# Patient Record
Sex: Male | Born: 1950 | Race: White | Hispanic: No | Marital: Married | Smoking: Current every day smoker
Health system: Southern US, Community
[De-identification: ages and names within clinical notes are randomized; demographics above are authoritative.]

## PROBLEM LIST (undated history)

## (undated) DIAGNOSIS — D126 Benign neoplasm of colon, unspecified: Secondary | ICD-10-CM

## (undated) DIAGNOSIS — D682 Hereditary deficiency of other clotting factors: Secondary | ICD-10-CM

## (undated) DIAGNOSIS — M1990 Unspecified osteoarthritis, unspecified site: Secondary | ICD-10-CM

## (undated) DIAGNOSIS — I10 Essential (primary) hypertension: Secondary | ICD-10-CM

## (undated) DIAGNOSIS — D689 Coagulation defect, unspecified: Secondary | ICD-10-CM

## (undated) DIAGNOSIS — E785 Hyperlipidemia, unspecified: Secondary | ICD-10-CM

## (undated) DIAGNOSIS — T7840XA Allergy, unspecified, initial encounter: Secondary | ICD-10-CM

## (undated) DIAGNOSIS — M75101 Unspecified rotator cuff tear or rupture of right shoulder, not specified as traumatic: Secondary | ICD-10-CM

## (undated) DIAGNOSIS — K648 Other hemorrhoids: Secondary | ICD-10-CM

## (undated) DIAGNOSIS — I82409 Acute embolism and thrombosis of unspecified deep veins of unspecified lower extremity: Secondary | ICD-10-CM

## (undated) HISTORY — DX: Acute embolism and thrombosis of unspecified deep veins of unspecified lower extremity: I82.409

## (undated) HISTORY — PX: COLONOSCOPY: SHX174

## (undated) HISTORY — DX: Allergy, unspecified, initial encounter: T78.40XA

## (undated) HISTORY — PX: SALIVARY STONE REMOVAL: SHX5213

## (undated) HISTORY — DX: Unspecified rotator cuff tear or rupture of right shoulder, not specified as traumatic: M75.101

## (undated) HISTORY — DX: Essential (primary) hypertension: I10

## (undated) HISTORY — DX: Coagulation defect, unspecified: D68.9

## (undated) HISTORY — PX: OTHER SURGICAL HISTORY: SHX169

## (undated) HISTORY — DX: Benign neoplasm of colon, unspecified: D12.6

## (undated) HISTORY — DX: Other hemorrhoids: K64.8

## (undated) HISTORY — DX: Unspecified osteoarthritis, unspecified site: M19.90

## (undated) HISTORY — DX: Hereditary deficiency of other clotting factors: D68.2

## (undated) HISTORY — DX: Hyperlipidemia, unspecified: E78.5

---

## 1999-12-06 ENCOUNTER — Emergency Department (HOSPITAL_COMMUNITY): Admission: EM | Admit: 1999-12-06 | Discharge: 1999-12-06 | Payer: Self-pay | Admitting: Emergency Medicine

## 1999-12-06 ENCOUNTER — Encounter: Payer: Self-pay | Admitting: Emergency Medicine

## 1999-12-17 ENCOUNTER — Ambulatory Visit (HOSPITAL_COMMUNITY): Admission: RE | Admit: 1999-12-17 | Discharge: 1999-12-17 | Payer: Self-pay | Admitting: Orthopedic Surgery

## 2001-11-06 ENCOUNTER — Ambulatory Visit (HOSPITAL_COMMUNITY): Admission: RE | Admit: 2001-11-06 | Discharge: 2001-11-06 | Payer: Self-pay | Admitting: Otolaryngology

## 2001-11-06 ENCOUNTER — Encounter: Payer: Self-pay | Admitting: Otolaryngology

## 2002-09-07 ENCOUNTER — Ambulatory Visit (HOSPITAL_COMMUNITY): Admission: RE | Admit: 2002-09-07 | Discharge: 2002-09-07 | Payer: Self-pay | Admitting: Otolaryngology

## 2002-09-07 ENCOUNTER — Encounter: Payer: Self-pay | Admitting: Otolaryngology

## 2004-11-30 ENCOUNTER — Ambulatory Visit: Payer: Self-pay | Admitting: Internal Medicine

## 2004-12-07 ENCOUNTER — Ambulatory Visit: Payer: Self-pay | Admitting: Internal Medicine

## 2005-01-18 ENCOUNTER — Ambulatory Visit: Payer: Self-pay | Admitting: Internal Medicine

## 2005-01-22 ENCOUNTER — Ambulatory Visit: Payer: Self-pay | Admitting: Internal Medicine

## 2005-02-15 ENCOUNTER — Ambulatory Visit: Payer: Self-pay | Admitting: Internal Medicine

## 2005-03-25 ENCOUNTER — Ambulatory Visit: Payer: Self-pay | Admitting: Internal Medicine

## 2005-04-26 ENCOUNTER — Ambulatory Visit: Payer: Self-pay | Admitting: Internal Medicine

## 2005-06-21 ENCOUNTER — Ambulatory Visit: Payer: Self-pay | Admitting: Internal Medicine

## 2005-06-28 ENCOUNTER — Ambulatory Visit: Payer: Self-pay | Admitting: Internal Medicine

## 2005-12-13 ENCOUNTER — Ambulatory Visit: Payer: Self-pay | Admitting: Internal Medicine

## 2006-01-10 ENCOUNTER — Ambulatory Visit: Payer: Self-pay | Admitting: Internal Medicine

## 2006-07-07 ENCOUNTER — Ambulatory Visit: Payer: Self-pay | Admitting: Internal Medicine

## 2006-07-11 ENCOUNTER — Ambulatory Visit: Payer: Self-pay | Admitting: Internal Medicine

## 2006-09-12 ENCOUNTER — Ambulatory Visit: Payer: Self-pay | Admitting: Internal Medicine

## 2006-11-14 DIAGNOSIS — D126 Benign neoplasm of colon, unspecified: Secondary | ICD-10-CM

## 2006-11-14 HISTORY — DX: Benign neoplasm of colon, unspecified: D12.6

## 2007-01-16 ENCOUNTER — Ambulatory Visit: Payer: Self-pay | Admitting: Internal Medicine

## 2007-01-16 LAB — CONVERTED CEMR LAB
ALT: 45 units/L — ABNORMAL HIGH (ref 0–40)
AST: 34 units/L (ref 0–37)
Albumin: 3.5 g/dL (ref 3.5–5.2)
Alkaline Phosphatase: 50 units/L (ref 39–117)
BUN: 17 mg/dL (ref 6–23)
Basophils Absolute: 0 10*3/uL (ref 0.0–0.1)
Basophils Relative: 0.1 % (ref 0.0–1.0)
Bilirubin, Direct: 0.1 mg/dL (ref 0.0–0.3)
CO2: 32 meq/L (ref 19–32)
Calcium: 9.6 mg/dL (ref 8.4–10.5)
Chloride: 107 meq/L (ref 96–112)
Cholesterol: 195 mg/dL (ref 0–200)
Creatinine, Ser: 0.8 mg/dL (ref 0.4–1.5)
Eosinophils Absolute: 0.2 10*3/uL (ref 0.0–0.6)
Eosinophils Relative: 2.7 % (ref 0.0–5.0)
GFR calc Af Amer: 129 mL/min
GFR calc non Af Amer: 107 mL/min
Glucose, Bld: 110 mg/dL — ABNORMAL HIGH (ref 70–99)
HCT: 47.9 % (ref 39.0–52.0)
HDL: 52.4 mg/dL (ref >39.0)
Hemoglobin: 16.1 g/dL (ref 13.0–17.0)
Hgb A1c MFr Bld: 5.5 % (ref 4.6–6.0)
LDL Cholesterol: 111 mg/dL — ABNORMAL HIGH (ref 0–99)
Lymphocytes Relative: 32.1 % (ref 12.0–46.0)
MCHC: 33.7 g/dL (ref 30.0–36.0)
MCV: 96.2 fL (ref 78.0–100.0)
Monocytes Absolute: 0.9 10*3/uL — ABNORMAL HIGH (ref 0.2–0.7)
Monocytes Relative: 13.5 % — ABNORMAL HIGH (ref 3.0–11.0)
Neutro Abs: 3.2 10*3/uL (ref 1.4–7.7)
Neutrophils Relative %: 51.6 % (ref 43.0–77.0)
PSA: 0.39 ng/mL (ref 0.10–4.00)
Platelets: 188 10*3/uL (ref 150–400)
Potassium: 4.7 meq/L (ref 3.5–5.1)
RBC: 4.98 M/uL (ref 4.22–5.81)
RDW: 12.6 % (ref 11.5–14.6)
Sodium: 144 meq/L (ref 135–145)
TSH: 1.02 microintl units/mL (ref 0.35–5.50)
Total Bilirubin: 0.7 mg/dL (ref 0.3–1.2)
Total CHOL/HDL Ratio: 3.7
Total Protein: 6.2 g/dL (ref 6.0–8.3)
Triglycerides: 157 mg/dL — ABNORMAL HIGH (ref 0–149)
VLDL: 31 mg/dL (ref 0–40)
WBC: 6.3 10*3/uL (ref 4.5–10.5)

## 2007-01-30 ENCOUNTER — Ambulatory Visit: Payer: Self-pay | Admitting: Internal Medicine

## 2007-02-23 ENCOUNTER — Ambulatory Visit: Payer: Self-pay | Admitting: Gastroenterology

## 2007-03-06 ENCOUNTER — Ambulatory Visit: Payer: Self-pay | Admitting: Gastroenterology

## 2007-03-06 ENCOUNTER — Encounter: Payer: Self-pay | Admitting: Gastroenterology

## 2007-07-31 ENCOUNTER — Ambulatory Visit: Payer: Self-pay | Admitting: Internal Medicine

## 2007-07-31 DIAGNOSIS — Z86718 Personal history of other venous thrombosis and embolism: Secondary | ICD-10-CM

## 2007-07-31 DIAGNOSIS — E785 Hyperlipidemia, unspecified: Secondary | ICD-10-CM | POA: Insufficient documentation

## 2007-07-31 DIAGNOSIS — D6851 Activated protein C resistance: Secondary | ICD-10-CM | POA: Insufficient documentation

## 2007-07-31 DIAGNOSIS — Z860100 Personal history of colon polyps, unspecified: Secondary | ICD-10-CM

## 2007-07-31 DIAGNOSIS — J309 Allergic rhinitis, unspecified: Secondary | ICD-10-CM | POA: Insufficient documentation

## 2007-07-31 HISTORY — DX: Personal history of colonic polyps: Z86.010

## 2007-07-31 HISTORY — DX: Allergic rhinitis, unspecified: J30.9

## 2007-07-31 HISTORY — DX: Activated protein C resistance: D68.51

## 2007-07-31 HISTORY — DX: Personal history of other venous thrombosis and embolism: Z86.718

## 2007-07-31 HISTORY — DX: Personal history of colon polyps, unspecified: Z86.0100

## 2007-07-31 LAB — CONVERTED CEMR LAB
ALT: 31 units/L (ref 0–53)
AST: 26 units/L (ref 0–37)
Albumin: 3.6 g/dL (ref 3.5–5.2)
Alkaline Phosphatase: 52 units/L (ref 39–117)
Bilirubin, Direct: 0.1 mg/dL (ref 0.0–0.3)
Cholesterol, target level: 200 mg/dL
Cholesterol: 208 mg/dL (ref 0–200)
Direct LDL: 125.1 mg/dL
HDL goal, serum: 40 mg/dL
HDL: 48.2 mg/dL (ref >39.0)
LDL Goal: 160 mg/dL
Total Bilirubin: 0.7 mg/dL (ref 0.3–1.2)
Total CHOL/HDL Ratio: 4.3
Total Protein: 6 g/dL (ref 6.0–8.3)
Triglycerides: 174 mg/dL — ABNORMAL HIGH (ref 0–149)
VLDL: 35 mg/dL (ref 0–40)

## 2007-12-11 DIAGNOSIS — I872 Venous insufficiency (chronic) (peripheral): Secondary | ICD-10-CM

## 2007-12-11 HISTORY — DX: Venous insufficiency (chronic) (peripheral): I87.2

## 2008-01-15 ENCOUNTER — Ambulatory Visit: Payer: Self-pay | Admitting: Internal Medicine

## 2008-01-15 LAB — CONVERTED CEMR LAB
ALT: 25 units/L (ref 0–53)
AST: 20 units/L (ref 0–37)
Albumin: 3.5 g/dL (ref 3.5–5.2)
Alkaline Phosphatase: 43 units/L (ref 39–117)
BUN: 15 mg/dL (ref 6–23)
Basophils Relative: 0.2 % (ref 0.0–1.0)
Bilirubin Urine: NEGATIVE
Bilirubin, Direct: 0.1 mg/dL (ref 0.0–0.3)
Blood in Urine, dipstick: NEGATIVE
CO2: 31 meq/L (ref 19–32)
Calcium: 9.4 mg/dL (ref 8.4–10.5)
Chloride: 108 meq/L (ref 96–112)
Cholesterol: 183 mg/dL (ref 0–200)
Creatinine, Ser: 0.8 mg/dL (ref 0.4–1.5)
Eosinophils Relative: 1.9 % (ref 0.0–5.0)
GFR calc Af Amer: 129 mL/min
GFR calc non Af Amer: 106 mL/min
Glucose, Bld: 94 mg/dL (ref 70–99)
Glucose, Urine, Semiquant: NEGATIVE
HCT: 45.9 % (ref 39.0–52.0)
HDL: 47.5 mg/dL (ref >39.0)
Hemoglobin: 14.9 g/dL (ref 13.0–17.0)
Ketones, urine, test strip: NEGATIVE
LDL Cholesterol: 118 mg/dL — ABNORMAL HIGH (ref 0–99)
Lymphocytes Relative: 20.9 % (ref 12.0–46.0)
MCHC: 32.6 g/dL (ref 30.0–36.0)
MCV: 99.1 fL (ref 78.0–100.0)
Monocytes Relative: 11 % (ref 3.0–12.0)
Neutrophils Relative %: 66 % (ref 43.0–77.0)
Nitrite: NEGATIVE
PSA: 0.31 ng/mL (ref 0.10–4.00)
Platelets: 170 10*3/uL (ref 150–400)
Potassium: 5.2 meq/L — ABNORMAL HIGH (ref 3.5–5.1)
Protein, U semiquant: NEGATIVE
RBC: 4.63 M/uL (ref 4.22–5.81)
RDW: 12.4 % (ref 11.5–14.6)
Sodium: 144 meq/L (ref 135–145)
Specific Gravity, Urine: 1.015
TSH: 0.84 microintl units/mL (ref 0.35–5.50)
Total Bilirubin: 0.8 mg/dL (ref 0.3–1.2)
Total CHOL/HDL Ratio: 3.9
Total Protein: 5.6 g/dL — ABNORMAL LOW (ref 6.0–8.3)
Triglycerides: 90 mg/dL (ref 0–149)
Urobilinogen, UA: 0.2
VLDL: 18 mg/dL (ref 0–40)
WBC Urine, dipstick: NEGATIVE
WBC: 9.2 10*3/uL (ref 4.5–10.5)
pH: 6.5

## 2008-03-04 ENCOUNTER — Ambulatory Visit: Payer: Self-pay | Admitting: Internal Medicine

## 2008-03-05 ENCOUNTER — Encounter: Payer: Self-pay | Admitting: Internal Medicine

## 2008-07-15 ENCOUNTER — Ambulatory Visit: Payer: Self-pay | Admitting: Internal Medicine

## 2008-07-15 DIAGNOSIS — M1990 Unspecified osteoarthritis, unspecified site: Secondary | ICD-10-CM | POA: Insufficient documentation

## 2008-07-15 DIAGNOSIS — M26609 Unspecified temporomandibular joint disorder, unspecified side: Secondary | ICD-10-CM

## 2008-07-15 HISTORY — DX: Unspecified temporomandibular joint disorder, unspecified side: M26.609

## 2008-08-26 ENCOUNTER — Ambulatory Visit: Payer: Self-pay | Admitting: Internal Medicine

## 2008-08-26 LAB — CONVERTED CEMR LAB
ALT: 31 units/L (ref 0–53)
Cholesterol: 200 mg/dL (ref 0–200)
HDL: 47.3 mg/dL (ref >39.0)
Total Protein: 5.9 g/dL — ABNORMAL LOW (ref 6.0–8.3)
Triglycerides: 83 mg/dL (ref 0–149)
VLDL: 17 mg/dL (ref 0–40)

## 2008-09-02 ENCOUNTER — Ambulatory Visit: Payer: Self-pay | Admitting: Internal Medicine

## 2009-03-03 ENCOUNTER — Ambulatory Visit: Payer: Self-pay | Admitting: Internal Medicine

## 2009-03-03 LAB — CONVERTED CEMR LAB
ALT: 25 units/L (ref 0–53)
Albumin: 3.7 g/dL (ref 3.5–5.2)
BUN: 19 mg/dL (ref 6–23)
Basophils Relative: 0.7 % (ref 0.0–3.0)
Bilirubin Urine: NEGATIVE
Chloride: 109 meq/L (ref 96–112)
Cholesterol: 183 mg/dL (ref 0–200)
Eosinophils Relative: 3.5 % (ref 0.0–5.0)
Glucose, Urine, Semiquant: NEGATIVE
HCT: 42.6 % (ref 39.0–52.0)
Hemoglobin: 14.8 g/dL (ref 13.0–17.0)
LDL Cholesterol: 106 mg/dL — ABNORMAL HIGH (ref 0–99)
Lymphs Abs: 1.5 10*3/uL (ref 0.7–4.0)
MCV: 97.4 fL (ref 78.0–100.0)
Monocytes Absolute: 0.6 10*3/uL (ref 0.1–1.0)
Neutro Abs: 3 10*3/uL (ref 1.4–7.7)
PSA: 0.31 ng/mL (ref 0.10–4.00)
Platelets: 145 10*3/uL — ABNORMAL LOW (ref 150.0–400.0)
Potassium: 4.5 meq/L (ref 3.5–5.1)
RBC: 4.37 M/uL (ref 4.22–5.81)
TSH: 1.01 microintl units/mL (ref 0.35–5.50)
Total Protein: 6.2 g/dL (ref 6.0–8.3)
Urobilinogen, UA: 0.2
WBC: 5.3 10*3/uL (ref 4.5–10.5)

## 2009-03-24 ENCOUNTER — Ambulatory Visit: Payer: Self-pay | Admitting: Internal Medicine

## 2009-03-24 DIAGNOSIS — F325 Major depressive disorder, single episode, in full remission: Secondary | ICD-10-CM | POA: Insufficient documentation

## 2009-03-24 DIAGNOSIS — F3342 Major depressive disorder, recurrent, in full remission: Secondary | ICD-10-CM | POA: Insufficient documentation

## 2009-03-24 HISTORY — DX: Major depressive disorder, single episode, in full remission: F32.5

## 2009-04-21 ENCOUNTER — Ambulatory Visit: Payer: Self-pay | Admitting: Internal Medicine

## 2009-06-14 ENCOUNTER — Ambulatory Visit: Payer: Self-pay | Admitting: Family Medicine

## 2009-06-27 ENCOUNTER — Encounter: Payer: Self-pay | Admitting: Internal Medicine

## 2009-07-07 ENCOUNTER — Ambulatory Visit: Payer: Self-pay | Admitting: Internal Medicine

## 2009-09-22 ENCOUNTER — Ambulatory Visit: Payer: Self-pay | Admitting: Internal Medicine

## 2009-11-24 ENCOUNTER — Ambulatory Visit: Payer: Self-pay | Admitting: Internal Medicine

## 2009-11-24 DIAGNOSIS — K648 Other hemorrhoids: Secondary | ICD-10-CM

## 2009-11-24 HISTORY — DX: Other hemorrhoids: K64.8

## 2010-05-11 ENCOUNTER — Ambulatory Visit: Payer: Self-pay | Admitting: Internal Medicine

## 2010-05-11 LAB — CONVERTED CEMR LAB
ALT: 29 units/L (ref 0–53)
AST: 23 units/L (ref 0–37)
Albumin: 3.7 g/dL (ref 3.5–5.2)
Alkaline Phosphatase: 52 units/L (ref 39–117)
Basophils Relative: 0.3 % (ref 0.0–3.0)
Bilirubin, Direct: 0.1 mg/dL (ref 0.0–0.3)
CO2: 31 meq/L (ref 19–32)
Calcium: 9.1 mg/dL (ref 8.4–10.5)
Chloride: 104 meq/L (ref 96–112)
Eosinophils Absolute: 0.2 10*3/uL (ref 0.0–0.7)
Eosinophils Relative: 3.3 % (ref 0.0–5.0)
Hemoglobin: 15.1 g/dL (ref 13.0–17.0)
LDL Cholesterol: 115 mg/dL — ABNORMAL HIGH (ref 0–99)
Lymphocytes Relative: 26 % (ref 12.0–46.0)
MCHC: 33.6 g/dL (ref 30.0–36.0)
MCV: 99.9 fL (ref 78.0–100.0)
Neutro Abs: 4.4 10*3/uL (ref 1.4–7.7)
Neutrophils Relative %: 59.2 % (ref 43.0–77.0)
Nitrite: NEGATIVE
RBC: 4.49 M/uL (ref 4.22–5.81)
Sodium: 138 meq/L (ref 135–145)
Specific Gravity, Urine: 1.025
Total CHOL/HDL Ratio: 3
Total Protein: 6.2 g/dL (ref 6.0–8.3)
Urobilinogen, UA: 0.2
WBC Urine, dipstick: NEGATIVE
WBC: 7.4 10*3/uL (ref 4.5–10.5)

## 2010-05-25 ENCOUNTER — Ambulatory Visit: Payer: Self-pay | Admitting: Internal Medicine

## 2010-06-23 ENCOUNTER — Encounter (INDEPENDENT_AMBULATORY_CARE_PROVIDER_SITE_OTHER): Payer: Self-pay | Admitting: Emergency Medicine

## 2010-06-23 ENCOUNTER — Inpatient Hospital Stay (HOSPITAL_COMMUNITY): Admission: EM | Admit: 2010-06-23 | Discharge: 2010-06-25 | Payer: Self-pay | Admitting: Emergency Medicine

## 2010-06-23 ENCOUNTER — Ambulatory Visit: Payer: Self-pay | Admitting: Vascular Surgery

## 2010-06-29 ENCOUNTER — Ambulatory Visit: Payer: Self-pay | Admitting: Internal Medicine

## 2010-06-29 LAB — CONVERTED CEMR LAB: INR: 1.2

## 2010-07-02 ENCOUNTER — Ambulatory Visit: Payer: Self-pay | Admitting: Internal Medicine

## 2010-07-02 LAB — CONVERTED CEMR LAB

## 2010-07-06 ENCOUNTER — Ambulatory Visit: Payer: Self-pay | Admitting: Internal Medicine

## 2010-07-06 LAB — CONVERTED CEMR LAB: INR: 1.7

## 2010-07-13 ENCOUNTER — Telehealth: Payer: Self-pay | Admitting: *Deleted

## 2010-07-13 ENCOUNTER — Ambulatory Visit: Payer: Self-pay | Admitting: Internal Medicine

## 2010-07-13 LAB — CONVERTED CEMR LAB: INR: 1.8

## 2010-07-27 ENCOUNTER — Ambulatory Visit: Payer: Self-pay | Admitting: Internal Medicine

## 2010-07-27 LAB — CONVERTED CEMR LAB: INR: 1.9

## 2010-08-17 ENCOUNTER — Ambulatory Visit: Payer: Self-pay | Admitting: Internal Medicine

## 2010-08-17 LAB — CONVERTED CEMR LAB: INR: 1.8

## 2010-09-14 ENCOUNTER — Ambulatory Visit: Payer: Self-pay | Admitting: Internal Medicine

## 2010-10-12 ENCOUNTER — Ambulatory Visit: Payer: Self-pay | Admitting: Internal Medicine

## 2010-10-26 ENCOUNTER — Ambulatory Visit
Admission: RE | Admit: 2010-10-26 | Discharge: 2010-10-26 | Payer: Self-pay | Source: Home / Self Care | Attending: Internal Medicine | Admitting: Internal Medicine

## 2010-10-26 LAB — CONVERTED CEMR LAB: INR: 2.7

## 2010-10-30 ENCOUNTER — Telehealth: Payer: Self-pay | Admitting: Internal Medicine

## 2010-10-31 ENCOUNTER — Telehealth (INDEPENDENT_AMBULATORY_CARE_PROVIDER_SITE_OTHER): Payer: Self-pay | Admitting: *Deleted

## 2010-11-13 NOTE — Assessment & Plan Note (Signed)
Summary: pt labs--ccm  Nurse Visit   Allergies: 1)  Keflex Laboratory Results   Blood Tests      INR: 1.7   (Normal Range: 0.88-1.12   Therap INR: 2.0-3.5)    Orders Added: 1)  Est. Patient Level I [10258] 2)  Protime [52778EU] Prescriptions: WARFARIN SODIUM 10 MG TABS (WARFARIN SODIUM) take one tab once daily or as directed by your doctor  #90 x 1   Entered by:   Kern Reap CMA (AAMA)   Authorized by:   Stacie Glaze MD   Signed by:   Kern Reap CMA (AAMA) on 07/06/2010   Method used:   Electronically to        [facility]  [address] (retail)       [address]       [place], [place]  [address]       Ph: [phone]       Fax: [phone]   RxID:   [id number]    ANTICOAGULATION RECORD  NEW REGIMEN & LAB RESULTS Anticoag. Dx: Deep venous thrombosis Current INR Goal Range: 2.0-3.0 Current INR: 1.7 Current Coumadin Dose(mg): 10mg  qd Regimen: same stop lovenox  Provider: Shamiyah Ngu Repeat testing in: 1 week  Anticoagulation Visit Questionnaire Coumadin dose missed/changed:  No Abnormal Bleeding Symptoms:  No  Any diet changes including alcohol intake, vegetables or greens since the last visit:  No Any illnesses or hospitalizations since the last visit:  No Any signs of clotting since the last visit (including chest discomfort, dizziness, shortness of breath, arm tingling, slurred speech, swelling or redness in leg):  No  MEDICATIONS CRESTOR 10 MG  TABS (ROSUVASTATIN CALCIUM) once daily THERAPEUTIC MULTIVITAMIN   TABS (MULTIPLE VITAMIN) once daily MELOXICAM 7.5 MG TABS (MELOXICAM) one by mouth daily CYMBALTA 30 MG CPEP (DULOXETINE HCL) one by mouth daily WELLBUTRIN XL 150 MG XR24H-TAB (BUPROPION HCL) one by mouth daily HYDROCORTISONE ACE-PRAMOXINE 2.5-1 % CREA (HYDROCORTISONE ACE-PRAMOXINE) apply PR two times a day for 7 days WARFARIN SODIUM 10 MG TABS (WARFARIN SODIUM) take one tab once daily or as directed by your doctor

## 2010-11-13 NOTE — Assessment & Plan Note (Signed)
Summary: cpx/njr/pt rsc from bmp/cjr rsc bmp/njr   Vital Signs:  Patient profile:   60 year old male Height:      75 inches Weight:      233 pounds BMI:     29.23 Temp:     98.2 degrees F oral Pulse rate:   72 / minute Resp:     14 per minute BP sitting:   130 / 82  (left arm)  Vitals Entered By: Willy Eddy, LPN (May 25, 2010 11:48 AM) CC: cpx Is Patient Diabetic? No   Primary Care Provider:  Stacie Glaze MD  CC:  cpx.  History of Present Illness: has lost 11 pounds ( 18 by home) The pt was asked about all immunizations, health maint. 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) 5)  Osteoarthritis  (ICD-715.90) 6)  Preventive Health Care  (ICD-V70.0) 7)  Venous Insufficiency, Legs  (ICD-459.81) 8)  Colonic Polyps, Hx of  (ICD-V12.72) 9)  Hyperlipidemia  (ICD-272.4) 10)  Allergic Rhinitis  (ICD-477.9) 11)  Dvt, Hx of  (ICD-V12.51) 12)  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)  Current Problems (verified): 1)  Acute Maxillary Sinusitis  (ICD-461.0) 2)  Internal Hemorrhoids With Other Complication  (ICD-455.2) 3)  Depression, Major, Recurrent, 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 Once Daily 2)  Crestor 10 Mg  Tabs (Rosuvastatin Calcium) .... Once Daily 3)  Therapeutic Multivitamin   Tabs (Multiple Vitamin) .... Once Daily 4)  Meloxicam 7.5 Mg Tabs (Meloxicam) .... One By Mouth Daily 5)  Cymbalta 30 Mg Cpep (Duloxetine Hcl) .... One By Mouth Daily 6)  Wellbutrin Xl 150 Mg Xr24h-Tab (Bupropion Hcl) .... One By Mouth Daily 7)  Smz-Tmp Ds 800-160 Mg Tabs (Sulfamethoxazole-Trimethoprim) .... One By Mouth Two Times A Day For 10 Days 8)  Allerx-D 120-2.5 Mg Xr12h-Tab (Pseudoephedrine-Methscopolamin) .... One By Mouth Two Times A Day For 10 Days 9)  Hydrocortisone Ace-Pramoxine 2.5-1 % Crea (Hydrocortisone Ace-Pramoxine) .... Apply Pr Two Times A Day For 7 Days  Current Medications (verified): 1)  Bayer Aspirin 325 Mg  Tabs (Aspirin) .... Once Daily 2)  Crestor 10 Mg  Tabs (Rosuvastatin Calcium) .... Once Daily 3)  Therapeutic Multivitamin   Tabs (Multiple Vitamin) .... Once Daily 4)  Meloxicam 7.5 Mg Tabs (Meloxicam) .... One By Mouth Daily 5)  Cymbalta 30 Mg Cpep (Duloxetine Hcl) .... One By Mouth Daily 6)  Wellbutrin Xl 150 Mg Xr24h-Tab (Bupropion Hcl) .... One By Mouth Daily 7)  Hydrocortisone Ace-Pramoxine 2.5-1 % Crea (Hydrocortisone Ace-Pramoxine) .... Apply Pr Two Times A Day For 7 Days  Allergies (verified): 1)  Keflex  Past History:  Family History: Last updated: 07/31/2007 blood cloting  ( factor V )  Social History: Last updated: 07/31/2007 Married Current Smoker Alcohol use-yes Drug use-no Regular exercise-yes  Risk Factors: Exercise: yes (07/31/2007)  Risk Factors: Smoking Status: current (05/25/2010) Packs/Day: 1.0 (05/25/2010)  Past medical, surgical, family and social histories (including risk factors) reviewed, and no changes noted (except as noted below).  Past Medical History: Reviewed history from  07/15/2008 and no changes required. DVT, hx of Allergic rhinitis Hyperlipidemia factor  5 def. Colonic polyps, hx of Osteoarthritis  Past Surgical History: Reviewed history from 07/31/2007 and no changes required. colon 2008   Family History: Reviewed history from 07/31/2007 and no changes required. blood cloting  ( factor V )  Social History: Reviewed history from 07/31/2007 and no changes required. Married Current Smoker Alcohol use-yes Drug use-no Regular exercise-yes  Review of Systems  The patient denies anorexia, fever, weight loss, weight gain, vision loss, decreased hearing, hoarseness, chest pain, syncope, dyspnea on exertion, peripheral edema, prolonged cough, headaches, hemoptysis, abdominal pain, melena, hematochezia, severe indigestion/heartburn, hematuria, incontinence, genital sores, muscle weakness, suspicious skin lesions, transient blindness, difficulty walking, depression, unusual weight change, abnormal bleeding, enlarged lymph nodes, angioedema, and breast masses.    Physical Exam  General:  Well-developed,well-nourished,in no acute distress; alert,appropriate and cooperative throughout examination Head:  Normocephalic and atraumatic without obvious abnormalities. No apparent alopecia or balding. Eyes:  pupils equal and pupils reactive to light.   Ears:  R ear normal and L ear normal.   Nose:  mucosal erythema, mucosal edema, and airflow obstruction.   Mouth:  posterior lymphoid hypertrophy.   Neck:  supple no adenopathy Lungs:  Normal respiratory effort, chest expands symmetrically. Lungs are clear to auscultation, no crackles or wheezes. Abdomen:  soft and nontender Rectal:  No external abnormalities noted. Normal sphincter tone. No rectal masses or tenderness.normal sphincter tone.   Prostate:  no gland enlargement and no nodules.   Msk:  joint tenderness and joint swelling.   Extremities:  No clubbing, cyanosis, edema, or deformity noted with normal  full range of motion of all joints.   Neurologic:  No cranial nerve deficits noted. Station and gait are normal. Plantar reflexes are down-going bilaterally. DTRs are symmetrical throughout. Sensory, motor and coordinative functions appear intact.   Impression & Recommendations:  Problem # 1:  PREVENTIVE HEALTH CARE (ICD-V70.0)  Colonoscopy: abnormal (03/14/2007) Td Booster: Td (10/14/2001)   Flu Vax: Fluvax 3+ (07/07/2009)   Chol: 186 (05/11/2010)   HDL: 58.40 (05/11/2010)   LDL: 115 (05/11/2010)   TG: 63.0 (05/11/2010) TSH: 0.65 (05/11/2010)   HgbA1C: 5.5 (01/16/2007)   PSA: 0.47 (05/11/2010) Next Colonoscopy due:: 03/2012 (03/24/2009)  Discussed using sunscreen, use of alcohol, drug use, self testicular exam, routine dental care, routine eye care, routine physical exam, seat belts, multiple vitamins, osteoporosis prevention, adequate calcium intake in diet, and recommendations for immunizations.  Discussed exercise and checking cholesterol.  Discussed gun safety, safe sex, and contraception. Also recommend checking PSA.  Problem # 2:  FACTOR V DEFICIENCY (ICD-286.3) stable  Complete Medication List: 1)  Bayer Aspirin 325 Mg Tabs (Aspirin) .... Once daily 2)  Crestor 10 Mg Tabs (Rosuvastatin calcium) .... Once daily 3)  Therapeutic Multivitamin Tabs (Multiple vitamin) .... Once daily 4)  Meloxicam 7.5 Mg Tabs (Meloxicam) .... One by mouth daily 5)  Cymbalta 30 Mg Cpep (Duloxetine hcl) .... One by mouth daily 6)  Wellbutrin Xl 150 Mg Xr24h-tab (Bupropion hcl) .Marland KitchenMarland KitchenMarland Kitchen  One by mouth daily 7)  Hydrocortisone Ace-pramoxine 2.5-1 % Crea (Hydrocortisone ace-pramoxine) .... Apply pr two times a day for 7 days  Patient Instructions: 1)  Please schedule a follow-up appointment in 6 months. 2)  Hepatic Panel prior to visit, ICD-9:995.20 3)  Lipid Panel prior to visit, ICD-9:272.4 Prescriptions: WELLBUTRIN XL 150 MG XR24H-TAB (BUPROPION HCL) one by mouth daily  #30 x 6   Entered by:   Willy Eddy, LPN   Authorized by:   Stacie Glaze MD   Signed by:   Willy Eddy, LPN on 16/07/9603   Method used:   Electronically to        [facility]  [facility] [address] (retail)       [address]       [place], [place]  [address]       Ph: [phone]       Fax: [phone]   RxID:   [id number] CYMBALTA 30 MG CPEP (DULOXETINE HCL) one by mouth daily  #30 x 6   Entered by:   Willy Eddy, LPN   Authorized by:   Stacie Glaze MD   Signed by:   Willy Eddy, LPN on 08/10/2535   Method used:   Electronically to        [facility]  [address] [address] (retail)       [address]       [place], [place]  [address]       Ph: [phone]       Fax: [phone]   RxID:   [id number]

## 2010-11-13 NOTE — Assessment & Plan Note (Signed)
Summary: 2 mo rov/mm   Vital Signs:  Patient profile:   60 year old male Height:      75 inches Weight:      244 pounds BMI:     30.61 Temp:     98.2 degrees F oral Pulse rate:   76 / minute Resp:     14 per minute BP sitting:   130 / 80  (left arm)  Vitals Entered By: Willy Eddy, LPN (November 24, 2009 10:26 AM) CC: roa- c/o Rodney Scott , URI symptoms   Primary Care Provider:  Stacie Glaze MD  CC:  roa- c/o uri  and URI symptoms.  History of Present Illness: pain over the right maxilary sinus  URI Symptoms      This is a 60 year old man who presents with URI symptoms.  symptoms persiting over a week .  The patient reports nasal congestion and productive cough, but denies clear nasal discharge, purulent nasal discharge, sore throat, earache, and sick contacts.  The patient denies fever, low-grade fever (<100.5 degrees), fever of 100.5-103 degrees, fever of 103.1-104 degrees, fever to >104 degrees, stiff neck, dyspnea, wheezing, rash, vomiting, diarrhea, use of an antipyretic, and response to antipyretic.  The patient also reports headache and muscle aches.  Risk factors for Strep sinusitis include unilateral facial pain and unilateral nasal discharge.    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) 6)  Osteoarthritis  (ICD-715.90) 7)  Preventive Health Care  (ICD-V70.0) 8)  Venous Insufficiency, Legs  (ICD-459.81) 9)  Hemorrhoids, Internal  (ICD-455.0) 10)  Colonic Polyps, Hx of  (ICD-V12.72) 11)  Hyperlipidemia  (ICD-272.4) 12)  Allergic Rhinitis  (ICD-477.9) 13)  Dvt,  Hx of  (ICD-V12.51) 14)  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)  Current Problems (verified): 1)  Gonococcal Prostatitis  (ICD-098.12) 2)  Depression, Major, Recurrent, 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 Once Daily 2)  Crestor 10 Mg  Tabs (Rosuvastatin Calcium) .... Once Daily 3)  Therapeutic Multivitamin   Tabs (Multiple Vitamin) .... Once Daily 4)  Meloxicam 7.5 Mg Tabs (Meloxicam) .... One By Mouth Daily 5)  Cymbalta 30 Mg Cpep (Duloxetine Hcl) .... One By Mouth Daily 6)  Wellbutrin Xl 150 Mg Xr24h-Tab (Bupropion Hcl) .... One By Mouth Daily  Current Medications (verified): 1)  Bayer Aspirin 325 Mg  Tabs (Aspirin) .... Once Daily 2)  Crestor 10 Mg  Tabs (Rosuvastatin Calcium) .... Once Daily 3)  Therapeutic Multivitamin   Tabs (Multiple Vitamin) .... Once Daily 4)  Meloxicam 7.5 Mg Tabs (Meloxicam) .... One By Mouth Daily  5)  Cymbalta 30 Mg Cpep (Duloxetine Hcl) .... One By Mouth Daily 6)  Wellbutrin Xl 150 Mg Xr24h-Tab (Bupropion Hcl) .... One By Mouth Daily 7)  Smz-Tmp Ds 800-160 Mg Tabs (Sulfamethoxazole-Trimethoprim) .... One By Mouth Two Times A Day For 10 Days 8)  Allerx-D 120-2.5 Mg Xr12h-Tab (Pseudoephedrine-Methscopolamin) .... One By Mouth Two Times A Day For 10 Days 9)  Hydrocortisone Ace-Pramoxine 2.5-1 % Crea (Hydrocortisone Ace-Pramoxine) .... Apply Pr Two Times A Day For 7 Days  Allergies (verified): 1)  Keflex  Past History:  Family  History: Last updated: 07/31/2007 blood cloting  ( factor V )  Social History: Last updated: 07/31/2007 Married Current Smoker Alcohol use-yes Drug use-no Regular exercise-yes  Risk Factors: Exercise: yes (07/31/2007)  Risk Factors: Smoking Status: current (11/24/2009) Packs/Day: 1.0 (11/24/2009)  Past medical, surgical, family and social histories (including risk factors) reviewed, and no changes noted (except as noted below).  Past Medical History: Reviewed history from 07/15/2008 and no changes required. DVT, hx of Allergic rhinitis Hyperlipidemia factor  5 def. Colonic polyps, hx of Osteoarthritis  Past Surgical History: Reviewed history from 07/31/2007 and no changes required. colon 2008   Family History: Reviewed history from 07/31/2007 and no changes required. blood cloting  ( factor V )  Social History: Reviewed history from 07/31/2007 and no changes required. Married Current Smoker Alcohol use-yes Drug use-no Regular exercise-yes  Review of Systems  The patient denies anorexia, fever, weight loss, weight gain, vision loss, decreased hearing, hoarseness, chest pain, syncope, dyspnea on exertion, peripheral edema, prolonged cough, headaches, hemoptysis, abdominal pain, melena, hematochezia, severe indigestion/heartburn, hematuria, incontinence, genital sores, muscle weakness, suspicious skin lesions, transient blindness, difficulty walking, depression, unusual weight change, abnormal bleeding, enlarged lymph nodes, angioedema, and breast masses.    Physical Exam  General:  Well-developed,well-nourished,in no acute distress; alert,appropriate and cooperative throughout examination Head:  Normocephalic and atraumatic without obvious abnormalities. No apparent alopecia or balding. Eyes:  No corneal or conjunctival inflammation noted. EOMI. Perrla. Funduscopic exam benign, without hemorrhages, exudates or papilledema. Vision grossly normal. Neck:  supple no  adenopathy Lungs:  Normal respiratory effort, chest expands symmetrically. Lungs are clear to auscultation, no crackles or wheezes. Heart:  regular rhythm and rate Abdomen:  soft and nontender Msk:  joint tenderness and joint swelling.     Impression & Recommendations:  Problem # 1:  ACUTE MAXILLARY SINUSITIS (ICD-461.0)  Instructed on treatment. Call if symptoms persist or worsen.   His updated medication list for this problem includes:    Smz-tmp Ds 800-160 Mg Tabs (Sulfamethoxazole-trimethoprim) ..... One by mouth two times a day for 10 days    Allerx-d 120-2.5 Mg Xr12h-tab (Pseudoephedrine-methscopolamin) ..... One by mouth two times a day for 10 days  Problem # 2:  INTERNAL HEMORRHOIDS WITH OTHER COMPLICATION (ICD-455.2) dicussion of fiber and bowel hapids had colon in past with this documented analpram two times a day for 7 days to shrink and treat  Complete Medication List: 1)  Bayer Aspirin 325 Mg Tabs (Aspirin) .... Once daily 2)  Crestor 10 Mg Tabs (Rosuvastatin calcium) .... Once daily 3)  Therapeutic Multivitamin Tabs (Multiple vitamin) .... Once daily 4)  Meloxicam 7.5 Mg Tabs (Meloxicam) .... One by mouth daily 5)  Cymbalta 30 Mg Cpep (Duloxetine hcl) .... One by mouth daily 6)  Wellbutrin Xl 150 Mg Xr24h-tab (Bupropion hcl) .... One by mouth daily 7)  Smz-tmp Ds 800-160 Mg Tabs (Sulfamethoxazole-trimethoprim) .... One by mouth two times a day for 10 days 8)  Allerx-d  120-2.5 Mg Xr12h-tab (Pseudoephedrine-methscopolamin) .... One by mouth two times a day for 10 days 9)  Hydrocortisone Ace-pramoxine 2.5-1 % Crea (Hydrocortisone ace-pramoxine) .... Apply pr two times a day for 7 days  Patient Instructions: 1)  Take your antibiotic as prescribed until ALL of it is gone, but stop if you develop a rash or swelling and contact our office as soon as possible. Prescriptions: HYDROCORTISONE ACE-PRAMOXINE 2.5-1 % CREA (HYDROCORTISONE ACE-PRAMOXINE) apply PR two times a day for  7 days  #1 tube x 11   Entered and Authorized by:   Stacie Glaze MD   Signed by:   Stacie Glaze MD on 11/24/2009   Method used:   Electronically to        [facility]  [address] (retail)       [address]       [place], [place]  [address]       Ph: [phone]       Fax: [phone]   RxID:   [id number] ALLERX-D 120-2.5 MG XR12H-TAB (PSEUDOEPHEDRINE-METHSCOPOLAMIN) one by mouth two times a day for 10 days  #20 x 0   Entered and Authorized by:   Stacie Glaze MD   Signed by:   Stacie Glaze MD on 11/24/2009   Method used:   Electronically to        [facility]  [address] (retail)       [address]       [place], [place]  [address]       Ph: [phone]       Fax: [phone]   RxID:   [id number] SMZ-TMP DS 800-160 MG TABS (SULFAMETHOXAZOLE-TRIMETHOPRIM) one by mouth two times a day for 10 days  #20 x 0   Entered and Authorized by:   Stacie Glaze MD   Signed by:   Stacie Glaze MD on 11/24/2009   Method used:   Electronically to        [facility]  [facility] [address] (retail)       [address]       [place], [place]  [address]       Ph: [phone]       Fax: [phone]   RxID:   [id number]

## 2010-11-13 NOTE — Assessment & Plan Note (Signed)
Summary: PT//SLM/PT RSC/CJR  Nurse Visit   Allergies: 1)  Keflex Laboratory Results   Blood Tests   Date/Time Received: July 13, 2010 2:15 PM  Date/Time Reported: July 13, 2010 2:15 PM    INR: 1.8   (Normal Range: 0.88-1.12   Therap INR: 2.0-3.5) Comments: Wynona Canes, CMA  July 13, 2010 2:15 PM     Orders Added: 1)  Est. Patient Level I [99211] 2)  Protime [16109UE]  Laboratory Results   Blood Tests      INR: 1.8   (Normal Range: 0.88-1.12   Therap INR: 2.0-3.5) Comments: Wynona Canes, CMA  July 13, 2010 2:15 PM       ANTICOAGULATION RECORD PREVIOUS REGIMEN & LAB RESULTS Anticoagulation Diagnosis:  Deep venous thrombosis on  07/06/2010 Previous INR Goal Range:  2.0-3.0 on  07/06/2010 Previous INR:  1.7 on  07/06/2010 Previous Coumadin Dose(mg):  10mg  qd on  07/06/2010 Previous Regimen:  same stop lovenox on  07/06/2010  NEW REGIMEN & LAB RESULTS Current INR: 1.8 Regimen: same stop lovenox  (no change)       Repeat testing in: 2 weeks MEDICATIONS CRESTOR 10 MG  TABS (ROSUVASTATIN CALCIUM) once daily THERAPEUTIC MULTIVITAMIN   TABS (MULTIPLE VITAMIN) once daily MELOXICAM 7.5 MG TABS (MELOXICAM) one by mouth daily CYMBALTA 30 MG CPEP (DULOXETINE HCL) one by mouth daily WELLBUTRIN XL 150 MG XR24H-TAB (BUPROPION HCL) one by mouth daily HYDROCORTISONE ACE-PRAMOXINE 2.5-1 % CREA (HYDROCORTISONE ACE-PRAMOXINE) apply PR two times a day for 7 days WARFARIN SODIUM 10 MG TABS (WARFARIN SODIUM) take one tab once daily or as directed by your doctor   Anticoagulation Visit Questionnaire      Coumadin dose missed/changed:  No      Abnormal Bleeding Symptoms:  No   Any diet changes including alcohol intake, vegetables or greens since the last visit:  No Any illnesses or hospitalizations since the last visit:  No Any signs of clotting since the last visit (including chest discomfort, dizziness, shortness of breath, arm tingling, slurred  speech, swelling or redness in leg):  No

## 2010-11-13 NOTE — Progress Notes (Signed)
Summary: Pt req script for Compression Hose  Phone Note Call from Patient Call back at Work Phone [phone]   Caller: Patient Summary of Call: Pt called and is req to get a script for Compression Hose. Pls call.  Initial call taken by: Lucy Antigua,  July 13, 2010 10:47 AM  Follow-up for Phone Call        done and pt will pick up pt today Follow-up by: Willy Eddy, LPN,  July 13, 2010 11:06 AM

## 2010-11-13 NOTE — Assessment & Plan Note (Signed)
Summary: fup pt inr per dr krishman//ccm   Vital Signs:  Patient profile:   60 year old male Height:      75 inches Weight:      230 pounds BMI:     28.85 Temp:     98.2 degrees F oral Pulse rate:   768 / minute Resp:     14 per minute BP sitting:   132 / 88  (left arm) Cuff size:   regular  Vitals Entered By: Willy Eddy, LPN (June 29, 2010 4:21 PM) CC: post hospital-on lovanox and coumadin 7.5- has enough lovanox to last until sunday Is Patient Diabetic? No   Primary Care Provider:  Stacie Glaze MD  CC:  post hospital-on lovanox and coumadin 7.5- has enough lovanox to last until sunday.  History of Present Illness: presented to the hospital with PE source DVT risk factor v deficiency hospital records reviewed with pt I have spent greater that 30 min face to face evaluating this patient of which over 1/2 was in counsilling about factor v dz and the need for coumadin for the forseeable future  Preventive Screening-Counseling & Management  Alcohol-Tobacco     Smoking Status: current     Smoke Cessation Stage: contemplative     Packs/Day: 1.0     Year Started: 1970     Tobacco Counseling: to quit use of tobacco products  Problems Prior to Update: 1)  Pe  (ICD-415.19) 2)  Acute Maxillary Sinusitis  (ICD-461.0) 3)  Internal Hemorrhoids With Other Complication  (ICD-455.2) 4)  Depression, Major, Recurrent, 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) 5)  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) 6)  Osteoarthritis  (ICD-715.90) 7)  Preventive Health Care  (ICD-V70.0) 8)  Venous Insufficiency, Legs  (ICD-459.81) 9)  Colonic Polyps, Hx of  (ICD-V12.72) 10)  Hyperlipidemia  (ICD-272.4) 11)  Allergic Rhinitis  (ICD-477.9) 12)  Dvt, Hx of  (ICD-V12.51) 13)  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)  Current Problems (verified): 1)  Acute Maxillary Sinusitis  (ICD-461.0) 2)  Internal Hemorrhoids With Other Complication  (ICD-455.2) 3)  Depression, Major, Recurrent, Moderate   (ICD-296.32) 4)  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 Once Daily 2)  Crestor 10 Mg  Tabs (Rosuvastatin Calcium) .... Once Daily 3)  Therapeutic Multivitamin   Tabs (Multiple Vitamin) .... Once Daily 4)  Meloxicam 7.5 Mg Tabs (Meloxicam) .... One By Mouth Daily 5)  Cymbalta 30 Mg Cpep (Duloxetine Hcl) .... One By Mouth Daily 6)  Wellbutrin Xl 150 Mg Xr24h-Tab (Bupropion Hcl) .... One By Mouth Daily 7)  Hydrocortisone Ace-Pramoxine 2.5-1 % Crea (Hydrocortisone Ace-Pramoxine) .... Apply Pr Two Times A Day For 7 Days  Current Medications (verified): 1)  Crestor 10 Mg  Tabs (Rosuvastatin Calcium) .... Once Daily 2)  Therapeutic Multivitamin   Tabs (Multiple Vitamin) .... Once Daily 3)  Meloxicam 7.5 Mg Tabs (Meloxicam) .... One By Mouth Daily 4)  Cymbalta 30 Mg Cpep (Duloxetine Hcl) .... One By Mouth Daily 5)  Wellbutrin Xl 150 Mg Xr24h-Tab (Bupropion Hcl) .... One By Mouth Daily 6)  Hydrocortisone Ace-Pramoxine 2.5-1 % Crea (Hydrocortisone Ace-Pramoxine) .... Apply Pr Two  Times A Day For 7 Days 7)  Lovenox 100 Mg/ml Soln (Enoxaparin Sodium) .... Subq Two Times A Day -Discharged 9-12 With #14 8)  Warfarin Sodium 7.5 Mg Tabs (Warfarin Sodium) .Marland Kitchen.. 1 Once Daily  Allergies (verified): 1)  Keflex  Past History:  Family History: Last updated: 07/31/2007 blood cloting  ( factor V )  Social History: Last updated: 07/31/2007 Married Current Smoker Alcohol use-yes Drug use-no Regular exercise-yes  Risk Factors: Exercise: yes (07/31/2007)  Risk Factors: Smoking Status: current (06/29/2010) Packs/Day: 1.0  (06/29/2010)  Past medical, surgical, family and social histories (including risk factors) reviewed, and no changes noted (except as noted below).  Past Medical History: Reviewed history from 07/15/2008 and no changes required. DVT, hx of Allergic rhinitis Hyperlipidemia factor  5 def. Colonic polyps, hx of Osteoarthritis  Past Surgical History: Reviewed history from 07/31/2007 and no changes required. colon 2008   Family History: Reviewed history from 07/31/2007 and no changes required. blood cloting  ( factor V )  Social History: Reviewed history from 07/31/2007 and no changes required. Married Current Smoker Alcohol use-yes Drug use-no Regular exercise-yes  Review of Systems  The patient denies anorexia, fever, weight loss, weight gain, vision loss, decreased hearing, hoarseness, chest pain, syncope, dyspnea on exertion, peripheral edema, prolonged cough, headaches, hemoptysis, abdominal pain, melena, hematochezia, severe indigestion/heartburn, hematuria, incontinence, genital sores, muscle weakness, suspicious skin lesions, transient blindness, difficulty walking, depression, unusual weight change, abnormal bleeding, enlarged lymph nodes, angioedema, breast masses, and testicular masses.         Flu Vaccine Consent Questions     Do you have a history of severe allergic reactions to this vaccine? no    Any prior history of allergic reactions to egg and/or gelatin? no    Do you have a sensitivity to the preservative Thimersol? no    Do you have a past history of Guillan-Barre Syndrome? no    Do you currently have an acute febrile illness? no    Have you ever had a severe reaction to latex? no    Vaccine information given and explained to patient? yes    Are you currently pregnant? no    Lot Number:[id number]   Exp Date:04/13/2011   Site Given  Left Deltoid IM   Physical Exam  General:  Well-developed,well-nourished,in no acute distress; alert,appropriate and  cooperative throughout examination Head:  Normocephalic and atraumatic without obvious abnormalities. No apparent alopecia or balding. Eyes:  pupils equal and pupils reactive to light.   Ears:  R ear normal and L ear normal.   Nose:  mucosal erythema, mucosal edema, and airflow obstruction.   Neck:  supple no adenopathy Lungs:  Normal respiratory effort, chest expands symmetrically. Lungs are clear to auscultation, no crackles or wheezes. Heart:  regular rhythm and rate Abdomen:  soft and nontender Extremities:  trace left pedal edema and trace right pedal edema.   Neurologic:  alert & oriented X3 and gait normal.     Impression & Recommendations:  Problem # 1:  PE (ICD-415.19) Assessment Deteriorated 10 mg friday sat and sunda protime monday The following medications were removed from the medication list:    Bayer Aspirin 325 Mg Tabs (Aspirin) ..... Once daily His updated medication list for this problem includes:    Warfarin Sodium 10 Mg Tabs (Warfarin sodium) .Marland Kitchen... 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 The following medications were removed from the medication list:    Bayer Aspirin 325 Mg Tabs (Aspirin) ..... Once daily His updated medication list for this problem includes:    Warfarin Sodium 10 Mg Tabs (Warfarin sodium) .Marland Kitchen... Take one tab once daily or as directed by your doctor  Orders: Protime (16109UE) Fingerstick (45409)  Complete Medication List: 1)  Crestor 10 Mg Tabs (Rosuvastatin calcium) .... Once daily 2)  Therapeutic Multivitamin Tabs (Multiple vitamin) .... Once daily 3)  Meloxicam 7.5 Mg Tabs (Meloxicam)  .... One by mouth daily 4)  Cymbalta 30 Mg Cpep (Duloxetine hcl) .... One by mouth daily 5)  Wellbutrin Xl 150 Mg Xr24h-tab (Bupropion hcl) .... One by mouth daily 6)  Hydrocortisone Ace-pramoxine 2.5-1 % Crea (Hydrocortisone ace-pramoxine) .... Apply pr two times a day for 7 days 7)  Warfarin Sodium 10 Mg Tabs (Warfarin sodium) .... Take one tab once daily or as directed by your doctor  Other Orders: Admin 1st Vaccine (81191) Flu Vaccine 53yrs + (47829)  Patient Instructions: 1)  take 10 mg one day friday,sat and sunday 2)  protime at 2 pm monday   ANTICOAGULATION RECORD  NEW REGIMEN & LAB RESULTS Anticoag. Dx: Deep venous thrombosis Current INR Goal Range: 2.0-3.0 Current INR: 1.2 Current Coumadin Dose(mg): 7.5mg . QD Regimen:   (no change)   Anticoagulation Visit Questionnaire Coumadin dose missed/changed:  No Abnormal Bleeding Symptoms:  No  Any diet changes including alcohol intake, vegetables or greens since the last visit:  No Any illnesses or hospitalizations since the last visit:  No Any signs of clotting since the last visit (including chest discomfort, dizziness, shortness of breath, arm tingling, slurred speech, swelling or redness in leg):  No  MEDICATIONS CRESTOR 10 MG  TABS (ROSUVASTATIN CALCIUM) once daily THERAPEUTIC MULTIVITAMIN   TABS (MULTIPLE VITAMIN) once daily MELOXICAM 7.5 MG TABS (MELOXICAM) one by mouth daily CYMBALTA 30 MG CPEP (DULOXETINE HCL) one by mouth daily WELLBUTRIN XL 150 MG XR24H-TAB (BUPROPION HCL) one by mouth daily HYDROCORTISONE ACE-PRAMOXINE 2.5-1 % CREA (HYDROCORTISONE ACE-PRAMOXINE) apply PR two times a day for 7 days WARFARIN SODIUM 10 MG TABS (WARFARIN SODIUM) take one tab once daily or as directed by your doctor    Laboratory Results   Blood Tests      INR: 1.2   (Normal Range: 0.88-1.12   Therap INR: 2.0-3.5) Comments: Rita Ohara  June 29, 2010 4:28 PM      Orders Added: 1)  Protime [85610QW] 2)   Fingerstick [36416] 3)  Admin 1st Vaccine [90471] 4)  Flu Vaccine 60yrs + [56213] 5)  Est. Patient Level IV [08657]    Immunization History:  Pneumovax Immunization History:    Pneumovax:  historical (06/22/2010)

## 2010-11-13 NOTE — Assessment & Plan Note (Signed)
Summary: pt/njr  Nurse Visit   Allergies: 1)  Keflex Laboratory Results   Blood Tests   Date/Time Received: July 27, 2010 12:18 PM  Date/Time Reported: July 27, 2010 12:18 PM    INR: 1.9   (Normal Range: 0.88-1.12   Therap INR: 2.0-3.5) Comments: Wynona Canes, CMA  July 27, 2010 12:18 PM     Orders Added: 1)  Est. Patient Level I [99211] 2)  Protime [32951OA]  Laboratory Results   Blood Tests      INR: 1.9   (Normal Range: 0.88-1.12   Therap INR: 2.0-3.5) Comments: Wynona Canes, CMA  July 27, 2010 12:18 PM       ANTICOAGULATION RECORD PREVIOUS REGIMEN & LAB RESULTS Anticoagulation Diagnosis:  Deep venous thrombosis on  07/06/2010 Previous INR Goal Range:  2.0-3.0 on  07/06/2010 Previous INR:  1.8 on  07/13/2010 Previous Coumadin Dose(mg):  10mg  qd on  07/06/2010 Previous Regimen:  same stop lovenox on  07/06/2010  NEW REGIMEN & LAB RESULTS Current INR: 1.9 Regimen: Same Dose       Repeat testing in: 3 weeks MEDICATIONS CRESTOR 10 MG  TABS (ROSUVASTATIN CALCIUM) once daily THERAPEUTIC MULTIVITAMIN   TABS (MULTIPLE VITAMIN) once daily MELOXICAM 7.5 MG TABS (MELOXICAM) one by mouth daily CYMBALTA 30 MG CPEP (DULOXETINE HCL) one by mouth daily WELLBUTRIN XL 150 MG XR24H-TAB (BUPROPION HCL) one by mouth daily HYDROCORTISONE ACE-PRAMOXINE 2.5-1 % CREA (HYDROCORTISONE ACE-PRAMOXINE) apply PR two times a day for 7 days WARFARIN SODIUM 10 MG TABS (WARFARIN SODIUM) take one tab once daily or as directed by your doctor   Anticoagulation Visit Questionnaire      Coumadin dose missed/changed:  No      Abnormal Bleeding Symptoms:  No   Any diet changes including alcohol intake, vegetables or greens since the last visit:  No Any illnesses or hospitalizations since the last visit:  No Any signs of clotting since the last visit (including chest discomfort, dizziness, shortness of breath, arm tingling, slurred speech, swelling or redness in leg):   No

## 2010-11-13 NOTE — Assessment & Plan Note (Signed)
Summary: PT//SLM  Nurse Visit   Allergies: 1)  Keflex Laboratory Results   Blood Tests      INR: 1.5   (Normal Range: 0.88-1.12   Therap INR: 2.0-3.5) Comments: Rita Ohara  July 02, 2010 1:56 PM     Orders Added: 1)  Est. Patient Level I [99211] 2)  Protime [[id number]]   ANTICOAGULATION RECORD  NEW REGIMEN & LAB RESULTS Anticoag. Dx: PE Current INR Goal Range: 2.0-3.0 Current INR: 1.5 Current Coumadin Dose(mg): 10mg . QD Regimen: same  Repeat testing in: Check Fri.  Anticoagulation Visit Questionnaire Coumadin dose missed/changed:  No Abnormal Bleeding Symptoms:  No  Any diet changes including alcohol intake, vegetables or greens since the last visit:  No Any illnesses or hospitalizations since the last visit:  No Any signs of clotting since the last visit (including chest discomfort, dizziness, shortness of breath, arm tingling, slurred speech, swelling or redness in leg):  Yes  MEDICATIONS CRESTOR 10 MG  TABS (ROSUVASTATIN CALCIUM) once daily THERAPEUTIC MULTIVITAMIN   TABS (MULTIPLE VITAMIN) once daily MELOXICAM 7.5 MG TABS (MELOXICAM) one by mouth daily CYMBALTA 30 MG CPEP (DULOXETINE HCL) one by mouth daily WELLBUTRIN XL 150 MG XR24H-TAB (BUPROPION HCL) one by mouth daily HYDROCORTISONE ACE-PRAMOXINE 2.5-1 % CREA (HYDROCORTISONE ACE-PRAMOXINE) apply PR two times a day for 7 days LOVENOX 100 MG/ML SOLN (ENOXAPARIN SODIUM) subq two times a day -discharged 9-12 with #14 WARFARIN SODIUM 7.5 MG TABS (WARFARIN SODIUM) 1 once daily

## 2010-11-13 NOTE — Assessment & Plan Note (Signed)
Summary: PT/CJR  Nurse Visit   Allergies: 1)  Keflex Laboratory Results   Blood Tests      INR: 1.8   (Normal Range: 0.88-1.12   Therap INR: 2.0-3.5) Comments: Rita Ohara  August 17, 2010 10:31 AM     Orders Added: 1)  Est. Patient Level I [99211] 2)  Protime [[id number]]   ANTICOAGULATION RECORD PREVIOUS REGIMEN & LAB RESULTS Anticoagulation Diagnosis:  Deep venous thrombosis on  07/06/2010 Previous INR Goal Range:  2.0-3.0 on  07/06/2010 Previous INR:  1.9 on  07/27/2010 Previous Coumadin Dose(mg):  10mg  qd on  07/06/2010 Previous Regimen:  Same Dose on  07/27/2010  NEW REGIMEN & LAB RESULTS Current INR: 1.8 Regimen: same  Repeat testing in: 4 weeks  Anticoagulation Visit Questionnaire Coumadin dose missed/changed:  No Abnormal Bleeding Symptoms:  Yes  Any diet changes including alcohol intake, vegetables or greens since the last visit:  No Any illnesses or hospitalizations since the last visit:  No Any signs of clotting since the last visit (including chest discomfort, dizziness, shortness of breath, arm tingling, slurred speech, swelling or redness in leg):  No  MEDICATIONS CRESTOR 10 MG  TABS (ROSUVASTATIN CALCIUM) once daily THERAPEUTIC MULTIVITAMIN   TABS (MULTIPLE VITAMIN) once daily MELOXICAM 7.5 MG TABS (MELOXICAM) one by mouth daily CYMBALTA 30 MG CPEP (DULOXETINE HCL) one by mouth daily WELLBUTRIN XL 150 MG XR24H-TAB (BUPROPION HCL) one by mouth daily HYDROCORTISONE ACE-PRAMOXINE 2.5-1 % CREA (HYDROCORTISONE ACE-PRAMOXINE) apply PR two times a day for 7 days WARFARIN SODIUM 10 MG TABS (WARFARIN SODIUM) take one tab once daily or as directed by your doctor

## 2010-11-15 NOTE — Assessment & Plan Note (Signed)
Summary: PT//CCM  Nurse Visit   Allergies: 1)  Keflex Laboratory Results   Blood Tests      INR: 1.8   (Normal Range: 0.88-1.12   Therap INR: 2.0-3.5) Comments: Rita Ohara  September 14, 2010 10:44 AM     Orders Added: 1)  Est. Patient Level I [99211] 2)  Protime [[id number]]   ANTICOAGULATION RECORD PREVIOUS REGIMEN & LAB RESULTS Anticoagulation Diagnosis:  Deep venous thrombosis on  07/06/2010 Previous INR Goal Range:  2.0-3.0 on  07/06/2010 Previous INR:  1.8 on  08/17/2010 Previous Coumadin Dose(mg):  10mg  qd on  07/06/2010 Previous Regimen:  same on  08/17/2010  NEW REGIMEN & LAB RESULTS Current INR: 1.8 Regimen: 10, 10, 10, 15mg . ALT.  Repeat testing in: 4 weeks  Anticoagulation Visit Questionnaire Coumadin dose missed/changed:  No Abnormal Bleeding Symptoms:  No  Any diet changes including alcohol intake, vegetables or greens since the last visit:  No Any illnesses or hospitalizations since the last visit:  No Any signs of clotting since the last visit (including chest discomfort, dizziness, shortness of breath, arm tingling, slurred speech, swelling or redness in leg):  No  MEDICATIONS CRESTOR 10 MG  TABS (ROSUVASTATIN CALCIUM) once daily THERAPEUTIC MULTIVITAMIN   TABS (MULTIPLE VITAMIN) once daily MELOXICAM 7.5 MG TABS (MELOXICAM) one by mouth daily CYMBALTA 30 MG CPEP (DULOXETINE HCL) one by mouth daily WELLBUTRIN XL 150 MG XR24H-TAB (BUPROPION HCL) one by mouth daily HYDROCORTISONE ACE-PRAMOXINE 2.5-1 % CREA (HYDROCORTISONE ACE-PRAMOXINE) apply PR two times a day for 7 days WARFARIN SODIUM 10 MG TABS (WARFARIN SODIUM) take one tab once daily or as directed by your doctor

## 2010-11-15 NOTE — Assessment & Plan Note (Signed)
Summary: pt//ccm  Nurse Visit   Allergies: 1)  Keflex Laboratory Results   Blood Tests      INR: 2.7   (Normal Range: 0.88-1.12   Therap INR: 2.0-3.5) Comments: Rita Ohara  October 26, 2010 10:15 AM     Orders Added: 1)  Est. Patient Level I [99211] 2)  Protime [[id number]]   ANTICOAGULATION RECORD PREVIOUS REGIMEN & LAB RESULTS Anticoagulation Diagnosis:  Deep venous thrombosis on  07/06/2010 Previous INR Goal Range:  2.0-3.0 on  07/06/2010 Previous INR:  1.5 on  10/12/2010 Previous Coumadin Dose(mg):  10mg  qd on  07/06/2010 Previous Regimen:  10mg . Mon. all others 15mg  on  10/12/2010  NEW REGIMEN & LAB RESULTS Current INR: 2.7 Regimen: 10mg . Mon. and Thurs. all other days 15mg .  Repeat testing in: 3 weeks  Anticoagulation Visit Questionnaire Coumadin dose missed/changed:  No Abnormal Bleeding Symptoms:  No  Any diet changes including alcohol intake, vegetables or greens since the last visit:  No Any illnesses or hospitalizations since the last visit:  No Any signs of clotting since the last visit (including chest discomfort, dizziness, shortness of breath, arm tingling, slurred speech, swelling or redness in leg):  No  MEDICATIONS CRESTOR 10 MG  TABS (ROSUVASTATIN CALCIUM) once daily THERAPEUTIC MULTIVITAMIN   TABS (MULTIPLE VITAMIN) once daily MELOXICAM 7.5 MG TABS (MELOXICAM) one by mouth daily CYMBALTA 30 MG CPEP (DULOXETINE HCL) one by mouth daily WELLBUTRIN XL 150 MG XR24H-TAB (BUPROPION HCL) one by mouth daily HYDROCORTISONE ACE-PRAMOXINE 2.5-1 % CREA (HYDROCORTISONE ACE-PRAMOXINE) apply PR two times a day for 7 days WARFARIN SODIUM 10 MG TABS (WARFARIN SODIUM) take one tab once daily or as directed by your doctor

## 2010-11-15 NOTE — Progress Notes (Signed)
  Phone Note Call from Patient Call back at Work Phone [phone]   Caller: Patient Call For: Stacie Glaze MD Summary of Call: Pt went to the ER for a fall, and the MD cannot do anything for him .  He is taking Meloxicam and Coumadin.  Is waiting for MRI on Workmens Comp.  He wants to take more than 7.5 mg of Meloxicam but the MD does not want him to due to Coumadin.  ??? what does Dr. Lovell Sheehan think?  Initial call taken by: Lynann Beaver CMA AAMA,  October 30, 2010 2:26 PM  Follow-up for Phone Call        would rather use celebrex 200 with coumadin may call in rx Follow-up by: Stacie Glaze MD,  October 30, 2010 3:56 PM  Additional Follow-up for Phone Call Additional follow up Details #1::        Pt. will call to tomorrow if he wants [place] to call this in, and where. Additional Follow-up by: Lynann Beaver CMA AAMA,  October 30, 2010 4:41 PM

## 2010-11-15 NOTE — Assessment & Plan Note (Signed)
Summary: pt/njr  Nurse Visit   Allergies: 1)  Keflex Laboratory Results   Blood Tests      INR: 1.5   (Normal Range: 0.88-1.12   Therap INR: 2.0-3.5) Comments: Rita Ohara  October 12, 2010 10:13 AM     Orders Added: 1)  Est. Patient Level I [99211] 2)  Protime [[id number]]   ANTICOAGULATION RECORD PREVIOUS REGIMEN & LAB RESULTS Anticoagulation Diagnosis:  Deep venous thrombosis on  07/06/2010 Previous INR Goal Range:  2.0-3.0 on  07/06/2010 Previous INR:  1.8 on  09/14/2010 Previous Coumadin Dose(mg):  10mg  qd on  07/06/2010 Previous Regimen:  10, 10, 10, 15mg . ALT. on  09/14/2010  NEW REGIMEN & LAB RESULTS Current INR: 1.5 Regimen: 10mg . Mon. all others 15mg   Repeat testing in: 2 weeks  Anticoagulation Visit Questionnaire Coumadin dose missed/changed:  No Abnormal Bleeding Symptoms:  No  Any diet changes including alcohol intake, vegetables or greens since the last visit:  No Any illnesses or hospitalizations since the last visit:  No Any signs of clotting since the last visit (including chest discomfort, dizziness, shortness of breath, arm tingling, slurred speech, swelling or redness in leg):  No  MEDICATIONS CRESTOR 10 MG  TABS (ROSUVASTATIN CALCIUM) once daily THERAPEUTIC MULTIVITAMIN   TABS (MULTIPLE VITAMIN) once daily MELOXICAM 7.5 MG TABS (MELOXICAM) one by mouth daily CYMBALTA 30 MG CPEP (DULOXETINE HCL) one by mouth daily WELLBUTRIN XL 150 MG XR24H-TAB (BUPROPION HCL) one by mouth daily HYDROCORTISONE ACE-PRAMOXINE 2.5-1 % CREA (HYDROCORTISONE ACE-PRAMOXINE) apply PR two times a day for 7 days WARFARIN SODIUM 10 MG TABS (WARFARIN SODIUM) take one tab once daily or as directed by your doctor

## 2010-11-15 NOTE — Progress Notes (Signed)
Summary: Celebrex  Phone Note Call from Patient   Caller: Patient Call For: Stacie Glaze MD Reason for Call: Refill Medication Summary of Call: [facility]  [phone] Initial call taken by: Lynann Beaver CMA AAMA,  October 31, 2010 10:57 AM    New/Updated Medications: CELEBREX 200 MG CAPS (CELECOXIB) one by mouth daily Prescriptions: CELEBREX 200 MG CAPS (CELECOXIB) one by mouth daily  #30 x 1   Entered by:   Lynann Beaver CMA AAMA   Authorized by:   Stacie Glaze MD   Signed by:   Lynann Beaver CMA AAMA on 10/31/2010   Method used:   Electronically to        [facility]  [address] (retail)       [address]       [place], [place]  [address]       Ph: [phone]       Fax: [phone]   RxID:   [id number]  Talked to pt.

## 2010-11-16 ENCOUNTER — Other Ambulatory Visit (INDEPENDENT_AMBULATORY_CARE_PROVIDER_SITE_OTHER): Payer: BC Managed Care – PPO | Admitting: Internal Medicine

## 2010-11-16 ENCOUNTER — Ambulatory Visit: Admit: 2010-11-16 | Payer: Self-pay | Admitting: Internal Medicine

## 2010-11-16 DIAGNOSIS — T887XXA Unspecified adverse effect of drug or medicament, initial encounter: Secondary | ICD-10-CM

## 2010-11-16 DIAGNOSIS — E785 Hyperlipidemia, unspecified: Secondary | ICD-10-CM

## 2010-11-16 DIAGNOSIS — I2699 Other pulmonary embolism without acute cor pulmonale: Secondary | ICD-10-CM

## 2010-11-16 DIAGNOSIS — Z7901 Long term (current) use of anticoagulants: Secondary | ICD-10-CM

## 2010-11-16 LAB — HEPATIC FUNCTION PANEL
AST: 17 U/L (ref 0–37)
Albumin: 3.4 g/dL — ABNORMAL LOW (ref 3.5–5.2)
Total Protein: 5.6 g/dL — ABNORMAL LOW (ref 6.0–8.3)

## 2010-11-16 LAB — POCT INR: INR: 4.1

## 2010-11-16 LAB — LIPID PANEL
Cholesterol: 211 mg/dL — ABNORMAL HIGH (ref 0–200)
Total CHOL/HDL Ratio: 5
Triglycerides: 516 mg/dL — ABNORMAL HIGH (ref 0.0–149.0)
VLDL: 103.2 mg/dL — ABNORMAL HIGH (ref 0.0–40.0)

## 2010-12-07 ENCOUNTER — Ambulatory Visit (INDEPENDENT_AMBULATORY_CARE_PROVIDER_SITE_OTHER): Payer: BC Managed Care – PPO | Admitting: Internal Medicine

## 2010-12-07 ENCOUNTER — Encounter: Payer: Self-pay | Admitting: Internal Medicine

## 2010-12-07 VITALS — BP 146/72 | HR 68 | Temp 98.2°F | Resp 14 | Ht 75.0 in | Wt 236.0 lb

## 2010-12-07 DIAGNOSIS — M75101 Unspecified rotator cuff tear or rupture of right shoulder, not specified as traumatic: Secondary | ICD-10-CM

## 2010-12-07 DIAGNOSIS — S43429A Sprain of unspecified rotator cuff capsule, initial encounter: Secondary | ICD-10-CM

## 2010-12-07 DIAGNOSIS — I82409 Acute embolism and thrombosis of unspecified deep veins of unspecified lower extremity: Secondary | ICD-10-CM

## 2010-12-07 DIAGNOSIS — E785 Hyperlipidemia, unspecified: Secondary | ICD-10-CM

## 2010-12-07 HISTORY — DX: Unspecified rotator cuff tear or rupture of right shoulder, not specified as traumatic: M75.101

## 2010-12-07 LAB — POCT INR: INR: 2.3

## 2010-12-07 NOTE — Progress Notes (Signed)
  Subjective:    Patient ID: Rodney Scott, male    DOB: Mar 30, 1951, 60 y.o.   MRN: [id number]  HPI  patient has an acute injury to his right shoulder from a fall at work fell forward hitting his knees and hands on the floor he now has pain in his he is extremely stiff with limited range of motion he seems to have an  partially torn  rotator cuff. He has pain radiating down his arm to his elbow.  Has been seeing the orthopedist. Dr Ranell Patrick. He states that the orthopedist had recommended that he not take the anti-inflammatory agents as it may affect the healing of the shoulder however he had a marked response to these medications and relief of pain and swelling we believe that if he continues with physical therapy that use of the Cox 2 inhibitors should be safe in light of his Coumadin therapy.   Review of Systems  Constitutional: Negative for fever and fatigue.  HENT: Positive for neck stiffness. Negative for hearing loss, congestion, neck pain and postnasal drip.   Eyes: Negative for discharge, redness and visual disturbance.  Respiratory: Negative for cough, shortness of breath and wheezing.   Cardiovascular: Negative for leg swelling.  Gastrointestinal: Negative for abdominal pain, constipation and abdominal distention.  Genitourinary: Negative for urgency and frequency.  Musculoskeletal: Positive for joint swelling and arthralgias.  Skin: Negative for color change and rash.  Neurological: Negative for weakness and light-headedness.  Hematological: Negative for adenopathy.  Psychiatric/Behavioral: Negative for behavioral problems.   Past Medical History  Diagnosis Date  . DVT (deep venous thrombosis)   . Allergic rhinitis   . Hyperlipidemia   . Factor V deficiency   . Colon polyps   . Osteoarthritis    No past surgical history on file.  reports that he has been smoking.  He does not have any smokeless tobacco history on file. He reports that he drinks alcohol. He reports that he  does not use illicit drugs. family history includes Factor V Leiden deficiency in an unspecified family member.        Objective:   Physical Exam  Constitutional: He is oriented to person, place, and time. He appears well-developed and well-nourished.  HENT:  Head: Normocephalic and atraumatic.  Neck: Normal range of motion. Neck supple.  Cardiovascular: Normal rate and regular rhythm.   Pulmonary/Chest: Effort normal and breath sounds normal.  Musculoskeletal: He exhibits edema and tenderness.  Neurological: He is alert and oriented to person, place, and time.  Skin: Skin is warm.          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 2. Shoulder pain, we will review the possible interactions of Celebrex with his medications and with his condition if there are no interactions or increased risks for now and would recommend Celebrex as a nonsteroidal because is ineffective in alleviating his pain rather than using Ultram and a muscle relaxant which may affect his clarity of thought

## 2010-12-07 NOTE — Assessment & Plan Note (Signed)
His diet has changed in part due to Coumadin therapy and admits to a few more sweets type triglycerides of raised to 516 which are due to the sweets his VLDL has raised to 103 which are again due to the carbohydrates and sugars. Reaffirmed diet and if that's not successful we'll have to increase Crestor to 20 mg

## 2010-12-07 NOTE — Assessment & Plan Note (Signed)
Resume the celebrex 200 BID

## 2010-12-07 NOTE — Progress Notes (Signed)
Addended by: Rita Ohara on: 12/07/2010 01:43 PM   Modules accepted: Orders

## 2010-12-27 LAB — POCT CARDIAC MARKERS
CKMB, poc: 1 ng/mL (ref 1.0–8.0)
Myoglobin, poc: 85.1 ng/mL (ref 12–200)
Troponin i, poc: <0.05 ng/mL (ref 0.00–0.09)

## 2010-12-27 LAB — CBC
HCT: 44.5 % (ref 39.0–52.0)
HCT: 44.9 % (ref 39.0–52.0)
Hemoglobin: 14.4 g/dL (ref 13.0–17.0)
Hemoglobin: 15.1 g/dL (ref 13.0–17.0)
Hemoglobin: 15.2 g/dL (ref 13.0–17.0)
MCH: 33 pg (ref 26.0–34.0)
MCH: 33.1 pg (ref 26.0–34.0)
MCHC: 32.4 g/dL (ref 30.0–36.0)
Platelets: 150 10*3/uL (ref 150–400)
RBC: 4.49 MIL/uL (ref 4.22–5.81)
RBC: 4.58 MIL/uL (ref 4.22–5.81)
RBC: 4.59 MIL/uL (ref 4.22–5.81)
WBC: 8.4 10*3/uL (ref 4.0–10.5)
WBC: 9.1 10*3/uL (ref 4.0–10.5)

## 2010-12-27 LAB — COMPREHENSIVE METABOLIC PANEL
ALT: 26 U/L (ref 0–53)
AST: 20 U/L (ref 0–37)
Albumin: 3.3 g/dL — ABNORMAL LOW (ref 3.5–5.2)
Alkaline Phosphatase: 56 U/L (ref 39–117)
Calcium: 9.4 mg/dL (ref 8.4–10.5)
GFR calc Af Amer: >60 mL/min (ref >60)
Glucose, Bld: 110 mg/dL — ABNORMAL HIGH (ref 70–99)
Potassium: 4.8 mEq/L (ref 3.5–5.1)
Sodium: 139 mEq/L (ref 135–145)
Total Protein: 6.3 g/dL (ref 6.0–8.3)

## 2010-12-27 LAB — DIFFERENTIAL
Eosinophils Absolute: 0.4 10*3/uL (ref 0.0–0.7)
Lymphs Abs: 2.3 10*3/uL (ref 0.7–4.0)
Monocytes Absolute: 1 10*3/uL (ref 0.1–1.0)
Monocytes Relative: 11 % (ref 3–12)
Neutrophils Relative %: 61 % (ref 43–77)

## 2010-12-27 LAB — PROTIME-INR
INR: 0.89 (ref 0.00–1.49)
INR: 0.94 (ref 0.00–1.49)
Prothrombin Time: 12.2 seconds (ref 11.6–15.2)
Prothrombin Time: 12.8 seconds (ref 11.6–15.2)
Prothrombin Time: 13 seconds (ref 11.6–15.2)

## 2010-12-27 LAB — CK TOTAL AND CKMB (NOT AT ARMC)
CK, MB: 3 ng/mL (ref 0.3–4.0)
Relative Index: 1.9 (ref 0.0–2.5)
Total CK: 156 U/L (ref 7–232)

## 2010-12-27 LAB — APTT: aPTT: 38 seconds — ABNORMAL HIGH (ref 24–37)

## 2010-12-27 LAB — TROPONIN I: Troponin I: 0.04 ng/mL (ref 0.00–0.06)

## 2011-01-04 ENCOUNTER — Other Ambulatory Visit: Payer: Self-pay | Admitting: *Deleted

## 2011-01-04 ENCOUNTER — Ambulatory Visit: Payer: BC Managed Care – PPO

## 2011-01-04 DIAGNOSIS — I82409 Acute embolism and thrombosis of unspecified deep veins of unspecified lower extremity: Secondary | ICD-10-CM

## 2011-01-04 LAB — POCT INR: INR: 2.8

## 2011-01-04 MED ORDER — WARFARIN SODIUM 10 MG PO TABS: 10.0000 mg | ORAL_TABLET | Freq: Every day | ORAL | Status: DC

## 2011-01-04 NOTE — Patient Instructions (Signed)
Same dose 

## 2011-02-01 ENCOUNTER — Ambulatory Visit (INDEPENDENT_AMBULATORY_CARE_PROVIDER_SITE_OTHER): Payer: BC Managed Care – PPO | Admitting: Internal Medicine

## 2011-02-01 DIAGNOSIS — I82409 Acute embolism and thrombosis of unspecified deep veins of unspecified lower extremity: Secondary | ICD-10-CM

## 2011-02-01 LAB — POCT INR: INR: 2.2

## 2011-02-01 NOTE — Patient Instructions (Signed)
Same dose 

## 2011-03-08 ENCOUNTER — Other Ambulatory Visit (INDEPENDENT_AMBULATORY_CARE_PROVIDER_SITE_OTHER): Payer: 59

## 2011-03-08 DIAGNOSIS — E785 Hyperlipidemia, unspecified: Secondary | ICD-10-CM

## 2011-03-08 DIAGNOSIS — I2699 Other pulmonary embolism without acute cor pulmonale: Secondary | ICD-10-CM

## 2011-03-08 DIAGNOSIS — I82409 Acute embolism and thrombosis of unspecified deep veins of unspecified lower extremity: Secondary | ICD-10-CM

## 2011-03-08 LAB — LIPID PANEL
HDL: 53.5 mg/dL (ref >39.00)
Total CHOL/HDL Ratio: 4

## 2011-03-08 LAB — POCT INR: INR: 3

## 2011-03-08 NOTE — Patient Instructions (Signed)
Same dose 

## 2011-03-15 ENCOUNTER — Ambulatory Visit (INDEPENDENT_AMBULATORY_CARE_PROVIDER_SITE_OTHER): Payer: 59 | Admitting: Internal Medicine

## 2011-03-15 ENCOUNTER — Encounter: Payer: Self-pay | Admitting: Internal Medicine

## 2011-03-15 VITALS — BP 130/80 | HR 58 | Temp 98.4°F | Resp 14 | Ht 75.0 in | Wt 236.0 lb

## 2011-03-15 DIAGNOSIS — E785 Hyperlipidemia, unspecified: Secondary | ICD-10-CM

## 2011-03-15 DIAGNOSIS — M1990 Unspecified osteoarthritis, unspecified site: Secondary | ICD-10-CM

## 2011-03-15 DIAGNOSIS — F17200 Nicotine dependence, unspecified, uncomplicated: Secondary | ICD-10-CM

## 2011-03-15 DIAGNOSIS — I2699 Other pulmonary embolism without acute cor pulmonale: Secondary | ICD-10-CM

## 2011-03-15 MED ORDER — MELOXICAM 15 MG PO TABS: 15.0000 mg | ORAL_TABLET | Freq: Every day | ORAL | Status: DC

## 2011-03-15 MED ORDER — VARENICLINE TARTRATE 0.5 MG PO TABS: 0.5000 mg | ORAL_TABLET | Freq: Two times a day (BID) | ORAL | Status: AC

## 2011-03-15 MED ORDER — VARENICLINE TARTRATE 1 MG PO TABS: 1.0000 mg | ORAL_TABLET | Freq: Two times a day (BID) | ORAL | Status: AC

## 2011-03-15 NOTE — Patient Instructions (Signed)
We'll need to be on Lovenox around the shoulder surgery if your orthopedist is uncomfortable with ordering the Lovenox just call my office and we'll set up

## 2011-03-15 NOTE — Progress Notes (Signed)
  Subjective:    Patient ID: Rodney Scott, male    DOB: 08-15-1951, 60 y.o.   MRN: [id number]  HPI  Taking 15 on Monday and Thursday and then 10 on other days of coumadin Last reading 3.0 No current bleeding or bruising issues The lipids are goals   Review of Systems  Constitutional: Negative for fever and fatigue.  HENT: Negative for hearing loss, congestion, neck pain and postnasal drip.   Eyes: Negative for discharge, redness and visual disturbance.  Respiratory: Negative for cough, shortness of breath and wheezing.   Cardiovascular: Negative for leg swelling.  Gastrointestinal: Negative for abdominal pain, constipation and abdominal distention.  Genitourinary: Negative for urgency and frequency.  Musculoskeletal: Negative for joint swelling and arthralgias.  Skin: Negative for color change and rash.  Neurological: Negative for weakness and light-headedness.  Hematological: Negative for adenopathy.  Psychiatric/Behavioral: Negative for behavioral problems.   Past Medical History  Diagnosis Date  . DVT (deep venous thrombosis)   . Allergic rhinitis   . Hyperlipidemia   . Factor V deficiency   . Colon polyps   . Osteoarthritis    History reviewed. No pertinent past surgical history.  reports that he has been smoking.  He does not have any smokeless tobacco history on file. He reports that he drinks alcohol. He reports that he does not use illicit drugs. family history includes Cancer in his father and Factor V Leiden deficiency in an unspecified family member. Allergies  Allergen Reactions  . Cephalexin     REACTION: solar rash       Objective:   Physical Exam  Constitutional: He appears well-developed and well-nourished.  HENT:  Head: Normocephalic and atraumatic.  Eyes: Conjunctivae are normal. Pupils are equal, round, and reactive to light.  Neck: Normal range of motion. Neck supple.  Cardiovascular: Normal rate and regular rhythm.   Pulmonary/Chest: Effort  normal and breath sounds normal.  Abdominal: Soft. Bowel sounds are normal.          Assessment & Plan:  The patient's Coumadin therapy for hypercoagulable syndrome is stable INR is at goal reviewed side effects of Coumadin.  The patient's cholesterol is at goal  The patient does not believe that the Celebrex has any more effect on his arthritis and the meloxicam   He wishes to try chantix for smoking cessation

## 2011-04-05 ENCOUNTER — Ambulatory Visit: Payer: 59

## 2011-04-05 DIAGNOSIS — I2699 Other pulmonary embolism without acute cor pulmonale: Secondary | ICD-10-CM

## 2011-04-05 DIAGNOSIS — I82409 Acute embolism and thrombosis of unspecified deep veins of unspecified lower extremity: Secondary | ICD-10-CM

## 2011-04-05 LAB — POCT INR: INR: 3.7

## 2011-04-05 NOTE — Patient Instructions (Signed)
10 mg everyday, check in 3 week

## 2011-04-18 ENCOUNTER — Telehealth: Payer: Self-pay | Admitting: *Deleted

## 2011-04-18 NOTE — Telephone Encounter (Signed)
How to given lovenox- surgery is monday and stopped holding coumadin 7-4

## 2011-04-18 NOTE — Telephone Encounter (Signed)
Orthopedic surgery center called and stated pt had instructions to stop coumadin 5 days prior to surgery which he stopped 7-4(surgery is 7/9)-- pt was on coumadin for dvt and past hx of PE and   Surgeon is requesting he go on lovenox. Pt instructed to call her and talk with me-  He already has lovenox injections- per dr Caryl Never , it is ok to start lovenox today and will discuss with dr Lovell Sheehan how to continue with lovenox while off of coumadin and  when to start back on coumadin. D rt jenkins returns tomorrow 7-6

## 2011-04-19 ENCOUNTER — Other Ambulatory Visit: Payer: Self-pay | Admitting: *Deleted

## 2011-04-19 MED ORDER — ENOXAPARIN SODIUM 100 MG/ML ~~LOC~~ SOLN: 100.0000 mg | SUBCUTANEOUS | Status: DC

## 2011-04-24 ENCOUNTER — Ambulatory Visit (INDEPENDENT_AMBULATORY_CARE_PROVIDER_SITE_OTHER): Payer: 59 | Admitting: Internal Medicine

## 2011-04-24 DIAGNOSIS — Z7901 Long term (current) use of anticoagulants: Secondary | ICD-10-CM

## 2011-04-24 DIAGNOSIS — I82409 Acute embolism and thrombosis of unspecified deep veins of unspecified lower extremity: Secondary | ICD-10-CM

## 2011-04-24 NOTE — Patient Instructions (Signed)
Take 15mg . QD Check Friday

## 2011-04-26 ENCOUNTER — Ambulatory Visit (INDEPENDENT_AMBULATORY_CARE_PROVIDER_SITE_OTHER): Payer: 59 | Admitting: Internal Medicine

## 2011-04-26 DIAGNOSIS — I82409 Acute embolism and thrombosis of unspecified deep veins of unspecified lower extremity: Secondary | ICD-10-CM

## 2011-04-26 DIAGNOSIS — I2699 Other pulmonary embolism without acute cor pulmonale: Secondary | ICD-10-CM

## 2011-04-26 LAB — POCT INR: INR: 1.1

## 2011-04-26 NOTE — Patient Instructions (Signed)
15 mg everyday,check in 1 week

## 2011-05-02 ENCOUNTER — Ambulatory Visit: Payer: 59

## 2011-05-02 DIAGNOSIS — I82409 Acute embolism and thrombosis of unspecified deep veins of unspecified lower extremity: Secondary | ICD-10-CM

## 2011-05-02 DIAGNOSIS — I2699 Other pulmonary embolism without acute cor pulmonale: Secondary | ICD-10-CM

## 2011-05-02 NOTE — Patient Instructions (Signed)
15 mg on mondays and thursdays 10 mg on other days check in 2 weeks

## 2011-05-28 ENCOUNTER — Ambulatory Visit (INDEPENDENT_AMBULATORY_CARE_PROVIDER_SITE_OTHER): Payer: 59 | Admitting: Internal Medicine

## 2011-05-28 DIAGNOSIS — I82409 Acute embolism and thrombosis of unspecified deep veins of unspecified lower extremity: Secondary | ICD-10-CM

## 2011-05-28 NOTE — Patient Instructions (Signed)
Same dose 

## 2011-06-16 ENCOUNTER — Other Ambulatory Visit: Payer: Self-pay | Admitting: Internal Medicine

## 2011-06-28 ENCOUNTER — Ambulatory Visit: Payer: 59 | Admitting: Internal Medicine

## 2011-06-28 ENCOUNTER — Ambulatory Visit (INDEPENDENT_AMBULATORY_CARE_PROVIDER_SITE_OTHER): Payer: 59

## 2011-06-28 ENCOUNTER — Other Ambulatory Visit: Payer: Self-pay | Admitting: *Deleted

## 2011-06-28 DIAGNOSIS — I2699 Other pulmonary embolism without acute cor pulmonale: Secondary | ICD-10-CM

## 2011-06-28 DIAGNOSIS — I82409 Acute embolism and thrombosis of unspecified deep veins of unspecified lower extremity: Secondary | ICD-10-CM

## 2011-06-28 DIAGNOSIS — Z7901 Long term (current) use of anticoagulants: Secondary | ICD-10-CM

## 2011-06-28 MED ORDER — WARFARIN SODIUM 10 MG PO TABS: 10.0000 mg | ORAL_TABLET | Freq: Every day | ORAL | Status: DC

## 2011-07-19 ENCOUNTER — Other Ambulatory Visit: Payer: Self-pay | Admitting: Internal Medicine

## 2011-07-26 ENCOUNTER — Encounter: Payer: Self-pay | Admitting: Internal Medicine

## 2011-07-26 ENCOUNTER — Ambulatory Visit (INDEPENDENT_AMBULATORY_CARE_PROVIDER_SITE_OTHER): Payer: 59 | Admitting: Internal Medicine

## 2011-07-26 VITALS — BP 130/80 | HR 72 | Temp 98.2°F | Resp 16 | Ht 75.0 in | Wt 244.0 lb

## 2011-07-26 DIAGNOSIS — E785 Hyperlipidemia, unspecified: Secondary | ICD-10-CM

## 2011-07-26 DIAGNOSIS — I2699 Other pulmonary embolism without acute cor pulmonale: Secondary | ICD-10-CM

## 2011-07-26 DIAGNOSIS — Z0000 Encounter for general adult medical examination without abnormal findings: Secondary | ICD-10-CM

## 2011-07-26 DIAGNOSIS — I82409 Acute embolism and thrombosis of unspecified deep veins of unspecified lower extremity: Secondary | ICD-10-CM

## 2011-07-26 DIAGNOSIS — Z23 Encounter for immunization: Secondary | ICD-10-CM

## 2011-07-26 LAB — POCT INR: INR: 3

## 2011-07-26 MED ORDER — ROSUVASTATIN CALCIUM 20 MG PO TABS: 20.0000 mg | ORAL_TABLET | Freq: Every day | ORAL | Status: DC

## 2011-07-26 NOTE — Patient Instructions (Addendum)
Patient was instructed to continue all medications as prescribed. To stop at the checkout desk and schedule a followup appointment  

## 2011-08-30 ENCOUNTER — Ambulatory Visit (INDEPENDENT_AMBULATORY_CARE_PROVIDER_SITE_OTHER): Payer: 59

## 2011-08-30 DIAGNOSIS — I2699 Other pulmonary embolism without acute cor pulmonale: Secondary | ICD-10-CM

## 2011-08-30 DIAGNOSIS — I82409 Acute embolism and thrombosis of unspecified deep veins of unspecified lower extremity: Secondary | ICD-10-CM

## 2011-08-30 LAB — POCT INR: INR: 2.3

## 2011-08-30 NOTE — Patient Instructions (Signed)
  Latest dosing instructions   Total Sun Mon Tue Wed Thu Fri Sat   80 10 mg 15 mg 10 mg 10 mg 15 mg 10 mg 10 mg    (10 mg1) (10 mg1.5) (10 mg1) (10 mg1) (10 mg1.5) (10 mg1) (10 mg1)

## 2011-09-27 ENCOUNTER — Ambulatory Visit: Payer: 59

## 2011-09-27 ENCOUNTER — Other Ambulatory Visit: Payer: Self-pay | Admitting: Internal Medicine

## 2011-09-27 DIAGNOSIS — I82409 Acute embolism and thrombosis of unspecified deep veins of unspecified lower extremity: Secondary | ICD-10-CM

## 2011-09-27 DIAGNOSIS — I2699 Other pulmonary embolism without acute cor pulmonale: Secondary | ICD-10-CM

## 2011-09-27 NOTE — Patient Instructions (Signed)
  Latest dosing instructions   Total Sun Mon Tue Wed Thu Fri Sat   80 10 mg 15 mg 10 mg 10 mg 15 mg 10 mg 10 mg    (10 mg1) (10 mg1.5) (10 mg1) (10 mg1) (10 mg1.5) (10 mg1) (10 mg1)        

## 2011-11-22 ENCOUNTER — Other Ambulatory Visit (INDEPENDENT_AMBULATORY_CARE_PROVIDER_SITE_OTHER): Payer: 59

## 2011-11-22 DIAGNOSIS — I2699 Other pulmonary embolism without acute cor pulmonale: Secondary | ICD-10-CM

## 2011-11-22 DIAGNOSIS — Z0000 Encounter for general adult medical examination without abnormal findings: Secondary | ICD-10-CM

## 2011-11-22 DIAGNOSIS — I82409 Acute embolism and thrombosis of unspecified deep veins of unspecified lower extremity: Secondary | ICD-10-CM

## 2011-11-22 LAB — BASIC METABOLIC PANEL
BUN: 13 mg/dL (ref 6–23)
Calcium: 9.6 mg/dL (ref 8.4–10.5)
GFR: 92.71 mL/min (ref >60.00)
Glucose, Bld: 96 mg/dL (ref 70–99)
Sodium: 141 mEq/L (ref 135–145)

## 2011-11-22 LAB — POCT URINALYSIS DIPSTICK
Bilirubin, UA: NEGATIVE
Nitrite, UA: NEGATIVE
Spec Grav, UA: 1.02
pH, UA: 7

## 2011-11-22 LAB — CBC WITH DIFFERENTIAL/PLATELET
Basophils Absolute: 0 10*3/uL (ref 0.0–0.1)
Eosinophils Absolute: 0.1 10*3/uL (ref 0.0–0.7)
HCT: 46 % (ref 39.0–52.0)
Hemoglobin: 15.7 g/dL (ref 13.0–17.0)
Lymphs Abs: 1.8 10*3/uL (ref 0.7–4.0)
MCHC: 34.2 g/dL (ref 30.0–36.0)
Neutro Abs: 4.9 10*3/uL (ref 1.4–7.7)
RDW: 13.5 % (ref 11.5–14.6)

## 2011-11-22 LAB — HEPATIC FUNCTION PANEL: Albumin: 3.7 g/dL (ref 3.5–5.2)

## 2011-11-22 LAB — LIPID PANEL
Cholesterol: 196 mg/dL (ref 0–200)
HDL: 51.9 mg/dL (ref >39.00)
Triglycerides: 194 mg/dL — ABNORMAL HIGH (ref 0.0–149.0)
VLDL: 38.8 mg/dL (ref 0.0–40.0)

## 2011-11-22 NOTE — Patient Instructions (Signed)
  Latest dosing instructions   Total Sun Mon Tue Wed Thu Fri Sat   80 10 mg 15 mg 10 mg 10 mg 15 mg 10 mg 10 mg    (10 mg1) (10 mg1.5) (10 mg1) (10 mg1) (10 mg1.5) (10 mg1) (10 mg1)        

## 2011-11-29 ENCOUNTER — Encounter: Payer: 59 | Admitting: Internal Medicine

## 2011-12-23 ENCOUNTER — Encounter: Payer: 59 | Admitting: Internal Medicine

## 2011-12-30 ENCOUNTER — Encounter: Payer: Self-pay | Admitting: Gastroenterology

## 2012-01-03 ENCOUNTER — Ambulatory Visit (INDEPENDENT_AMBULATORY_CARE_PROVIDER_SITE_OTHER): Payer: BC Managed Care – PPO | Admitting: Internal Medicine

## 2012-01-03 VITALS — BP 140/86 | HR 100 | Temp 98.3°F | Resp 16 | Ht 76.0 in | Wt 238.0 lb

## 2012-01-03 DIAGNOSIS — I82409 Acute embolism and thrombosis of unspecified deep veins of unspecified lower extremity: Secondary | ICD-10-CM

## 2012-01-03 DIAGNOSIS — I2699 Other pulmonary embolism without acute cor pulmonale: Secondary | ICD-10-CM

## 2012-01-03 DIAGNOSIS — Z0000 Encounter for general adult medical examination without abnormal findings: Secondary | ICD-10-CM

## 2012-01-03 DIAGNOSIS — Z23 Encounter for immunization: Secondary | ICD-10-CM

## 2012-01-03 LAB — POCT INR: INR: 1.8

## 2012-01-03 NOTE — Progress Notes (Signed)
Subjective:    Patient ID: Rodney Scott, male    DOB: 1951/06/01, 60 y.o.   MRN: [id number]  HPI  CPX On coumadin for her hypercoagulable syndrome  Due to factor V  Deficiency He is wearing his TED hose He has no reported complications   Review of Systems  Constitutional: Negative for fever and fatigue.  HENT: Negative for hearing loss, congestion, neck pain and postnasal drip.   Eyes: Negative for discharge, redness and visual disturbance.  Respiratory: Negative for cough, shortness of breath and wheezing.   Cardiovascular: Negative for leg swelling.  Gastrointestinal: Negative for abdominal pain, constipation and abdominal distention.  Genitourinary: Negative for urgency and frequency.  Musculoskeletal: Negative for joint swelling and arthralgias.  Skin: Negative for color change and rash.  Neurological: Negative for weakness and light-headedness.  Hematological: Negative for adenopathy.  Psychiatric/Behavioral: Negative for behavioral problems.   Past Medical History  Diagnosis Date  . DVT (deep venous thrombosis)   . Allergic rhinitis   . Hyperlipidemia   . Factor V deficiency   . Colon polyps   . Osteoarthritis     History   Social History  . Marital Status: Married    Spouse Name: N/A    Number of Children: N/A  . Years of Education: N/A   Occupational History  . Not on file.   Social History Main Topics  . Smoking status: Current Everyday Smoker  . Smokeless tobacco: Not on file  . Alcohol Use: Yes  . Drug Use: No  . Sexually Active: Yes   Other Topics Concern  . Not on file   Social History Narrative  . No narrative on file    No past surgical history on file.  Family History  Problem Relation Age of Onset  . Factor V Leiden deficiency    . Cancer Father     Allergies  Allergen Reactions  . Cephalexin     REACTION: solar rash    Current Outpatient Prescriptions on File Prior to Visit  Medication Sig Dispense Refill  . buPROPion  (WELLBUTRIN XL) 150 MG 24 hr tablet TAKE ONE TABLET BY MOUTH EVERY DAY  30 tablet  5  . hydrocortisone-pramoxine (ANALPRAM-HC) 2.5-1 % rectal cream APPLY PER RECTALLY TWICE DAILY FOR 7 DAYS.  30 g  10  . Loratadine 10 MG CAPS Take by mouth daily.        . meloxicam (MOBIC) 15 MG tablet TAKE ONE TABLET BY MOUTH EVERY DAY  30 tablet  6  . rosuvastatin (CRESTOR) 20 MG tablet Take 1 tablet (20 mg total) by mouth daily.  30 tablet  11  . warfarin (COUMADIN) 10 MG tablet Take 1 tablet (10 mg total) by mouth daily. Or as directed by doctor  90 tablet  6    BP 140/86  Pulse 100  Temp 98.3 F (36.8 C)  Resp 16  Ht 6\' 4"  (1.93 m)  Wt 238 lb (107.956 kg)  BMI 28.97 kg/m2       Objective:   Physical Exam  Nursing note and vitals reviewed. Constitutional: He is oriented to person, place, and time. He appears well-developed and well-nourished.  HENT:  Head: Normocephalic and atraumatic.  Eyes: Conjunctivae are normal. Pupils are equal, round, and reactive to light.  Neck: Normal range of motion. Neck supple.  Cardiovascular: Normal rate and regular rhythm.   Pulmonary/Chest: Effort normal and breath sounds normal.  Abdominal: Soft. Bowel sounds are normal.  Musculoskeletal: Normal range of motion.  Neurological:  He is alert and oriented to person, place, and time.  Skin: Skin is warm and dry.  Psychiatric: He has a normal mood and affect. His behavior is normal.          Assessment & Plan:   Patient presents for yearly preventative medicine examination.   all immunizations and health maintenance protocols were reviewed with the patient and they are up to date with these protocols.   screening laboratory values were reviewed with the patient including screening of hyperlipidemia PSA renal function and hepatic function.   There medications past medical history social history problem list and allergies were reviewed in detail.   Goals were established with regard to weight loss  exercise diet in compliance with medications  Discussion of long-term Coumadin therapy versus alternative anticoagulants discussed with the patient at this time although there is no indication for his particular hypercoagulable syndrome we will review the literature.

## 2012-01-03 NOTE — Patient Instructions (Signed)
  Latest dosing instructions   Total Sun Mon Tue Wed Thu Fri Sat   80 10 mg 15 mg 10 mg 10 mg 15 mg 10 mg 10 mg    (10 mg1) (10 mg1.5) (10 mg1) (10 mg1) (10 mg1.5) (10 mg1) (10 mg1)        

## 2012-01-07 ENCOUNTER — Encounter: Payer: Self-pay | Admitting: Internal Medicine

## 2012-02-07 ENCOUNTER — Ambulatory Visit (INDEPENDENT_AMBULATORY_CARE_PROVIDER_SITE_OTHER): Payer: BC Managed Care – PPO | Admitting: Internal Medicine

## 2012-02-07 DIAGNOSIS — I82409 Acute embolism and thrombosis of unspecified deep veins of unspecified lower extremity: Secondary | ICD-10-CM

## 2012-02-07 DIAGNOSIS — I2699 Other pulmonary embolism without acute cor pulmonale: Secondary | ICD-10-CM

## 2012-02-07 NOTE — Patient Instructions (Signed)
  Latest dosing instructions   Total Sun Mon Tue Wed Thu Fri Sat   80 10 mg 15 mg 10 mg 10 mg 15 mg 10 mg 10 mg    (10 mg1) (10 mg1.5) (10 mg1) (10 mg1) (10 mg1.5) (10 mg1) (10 mg1)        

## 2012-03-07 ENCOUNTER — Other Ambulatory Visit: Payer: Self-pay | Admitting: Internal Medicine

## 2012-03-13 ENCOUNTER — Ambulatory Visit (INDEPENDENT_AMBULATORY_CARE_PROVIDER_SITE_OTHER): Payer: BC Managed Care – PPO | Admitting: Family

## 2012-03-13 DIAGNOSIS — I82409 Acute embolism and thrombosis of unspecified deep veins of unspecified lower extremity: Secondary | ICD-10-CM

## 2012-03-13 DIAGNOSIS — I2699 Other pulmonary embolism without acute cor pulmonale: Secondary | ICD-10-CM

## 2012-03-13 NOTE — Patient Instructions (Signed)
  Latest dosing instructions   Total Sun Mon Tue Wed Thu Fri Sat   80 10 mg 15 mg 10 mg 10 mg 15 mg 10 mg 10 mg    (10 mg1) (10 mg1.5) (10 mg1) (10 mg1) (10 mg1.5) (10 mg1) (10 mg1)        

## 2012-03-23 IMAGING — CT CT ANGIO CHEST
2 of 6 series · 19 of 36 positions shown · IV contrast (APPLIED)
Comparison: None.

CLINICAL DATA: Right leg swelling and DVT.  Chest pain.

CT ANGIOGRAPHY CHEST WITH CONTRAST
TECHNIQUE: Multidetector CT imaging of the chest was performed
using the standard protocol during bolus administration of
intravenous contrast.  Multiplanar CT image reconstructions
including MIPs were obtained to evaluate the vascular anatomy.
Contrast:  100 ml [id number]-WZZ

[Series 8: pulm embolism 1.0 b25f thins · axial · 0.70mm/px · z∈[-338,-84]mm · 18 of 285 slices shown]
[im 15/285  lung]
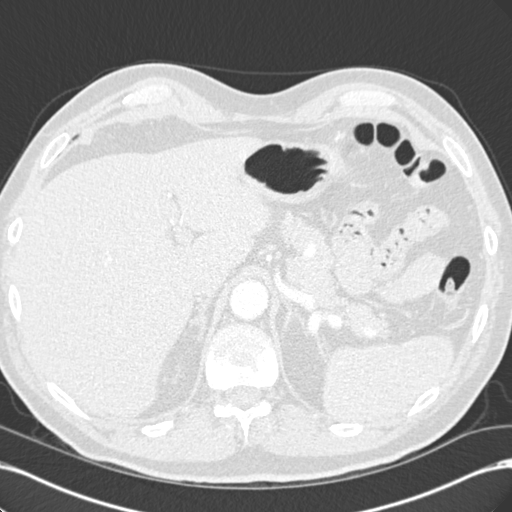
[im 29/285  mediastinal]
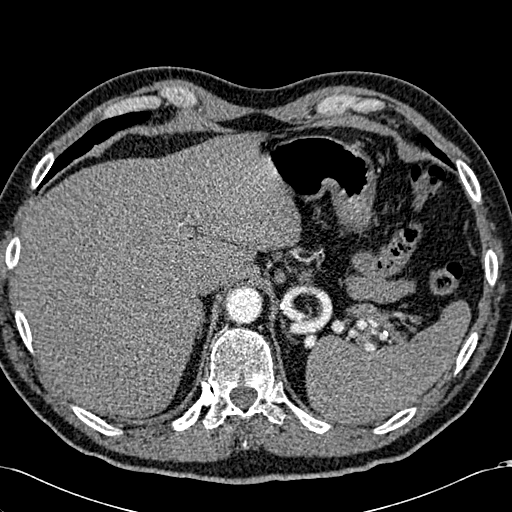
[im 43/285  lung]
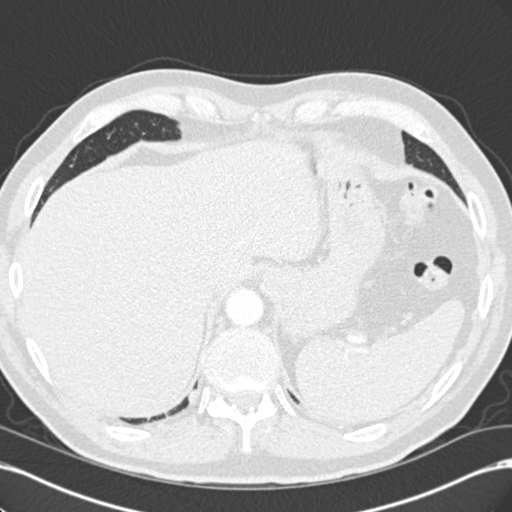
[im 57/285  mediastinal]
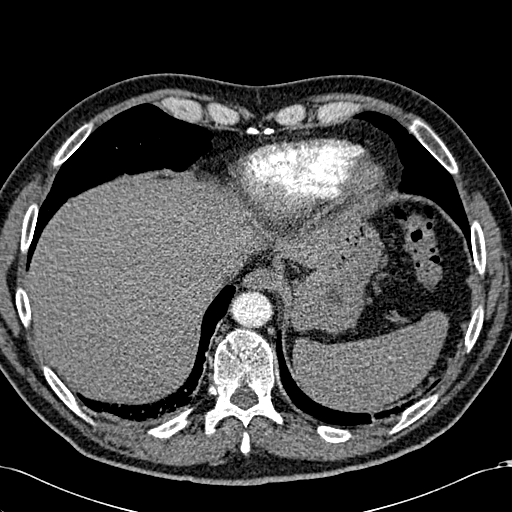
[im 72/285  lung]
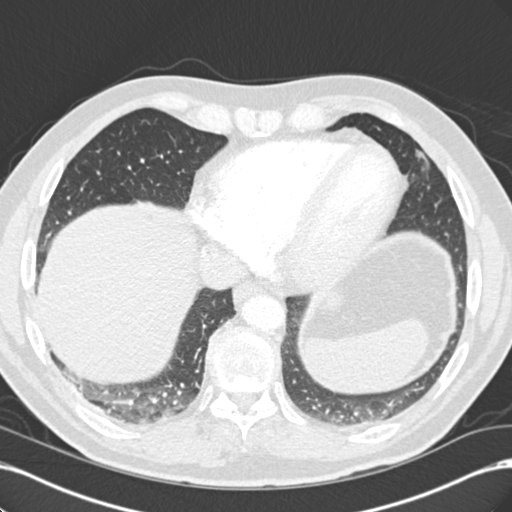
[im 86/285  mediastinal]
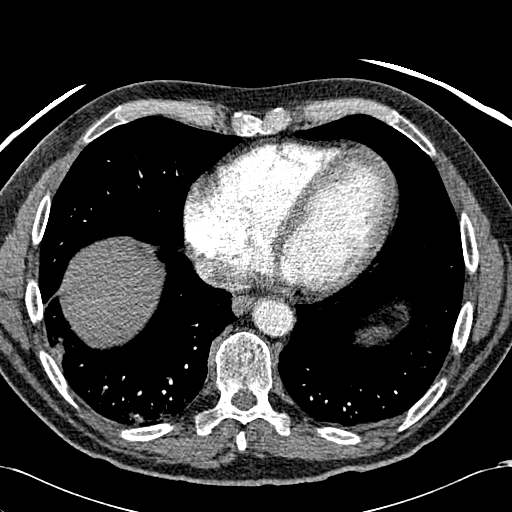
[im 100/285  lung]
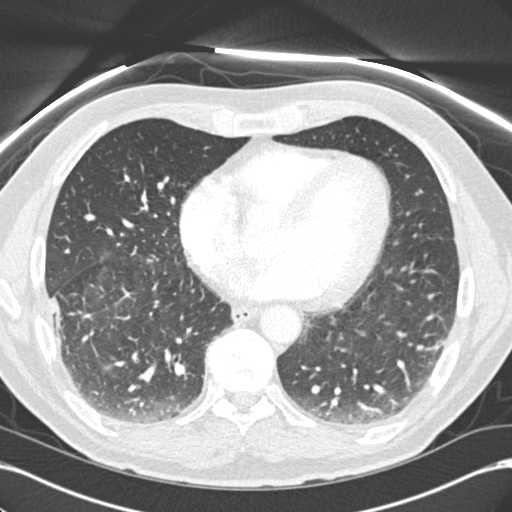
[im 114/285  mediastinal]
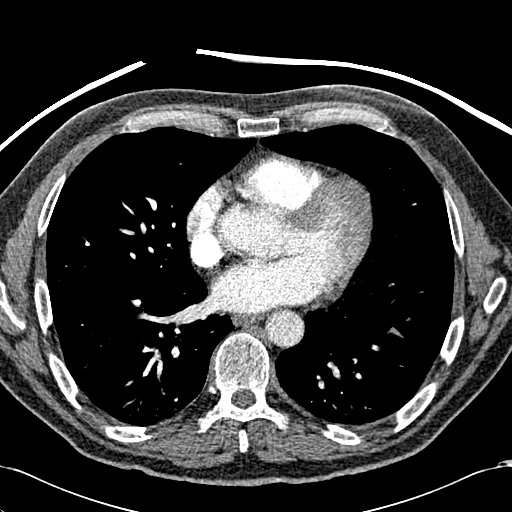
[im 128/285  lung]
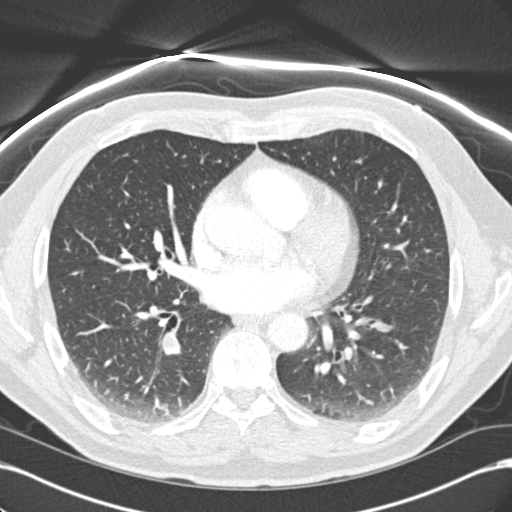
[im 157/285  mediastinal]
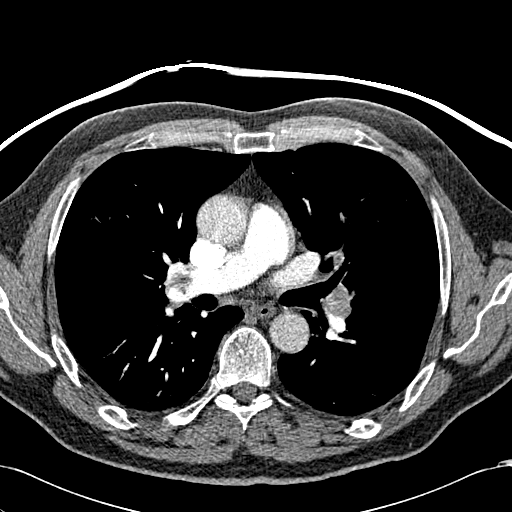
[im 171/285  lung]
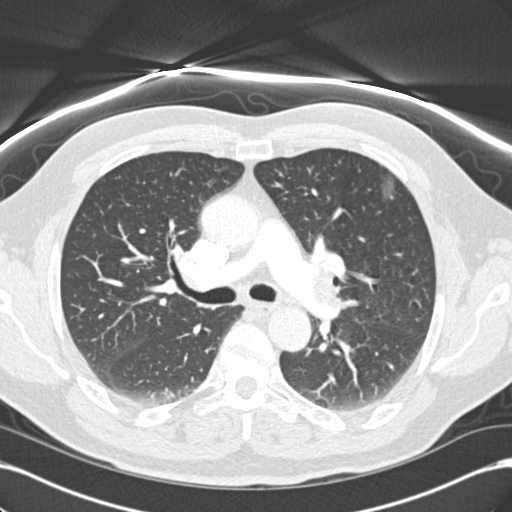
[im 185/285  mediastinal]
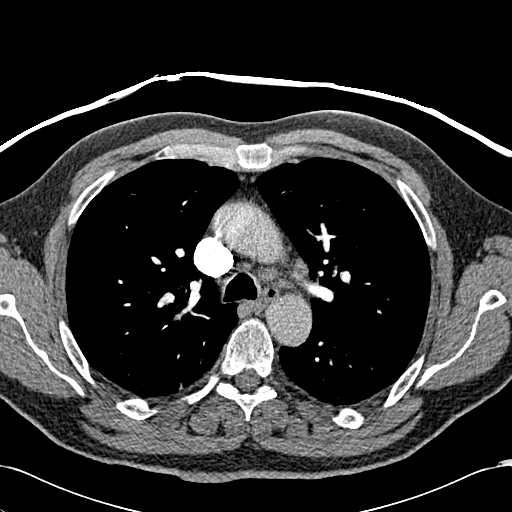
[im 199/285  lung]
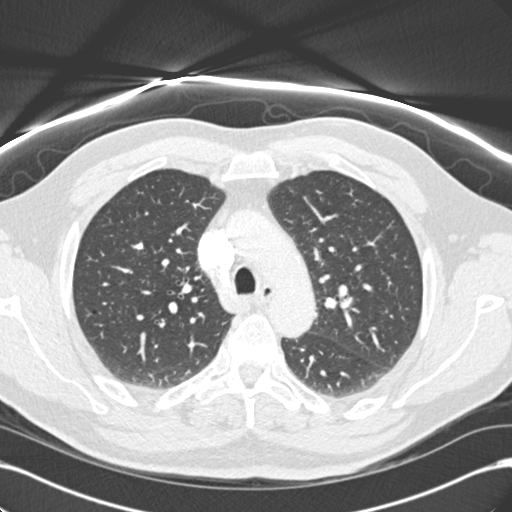
[im 214/285  mediastinal]
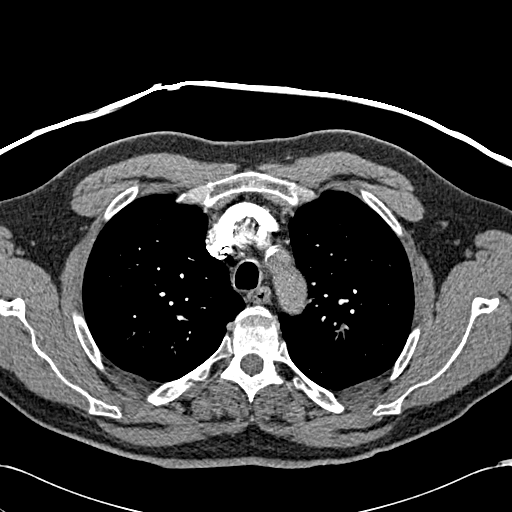
[im 228/285  lung]
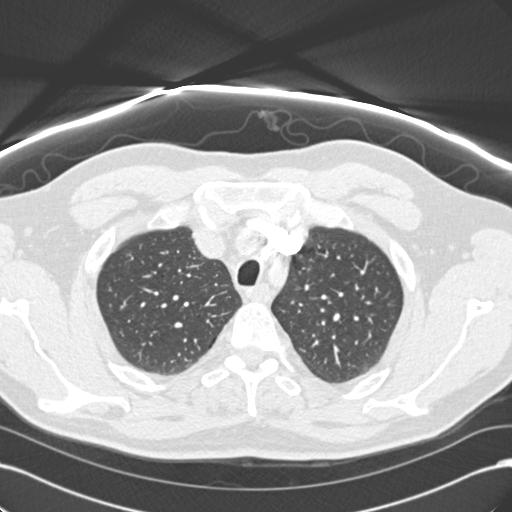
[im 242/285  mediastinal]
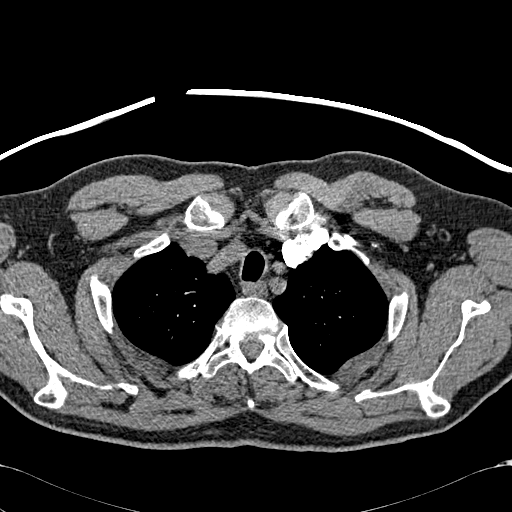
[im 256/285  lung]
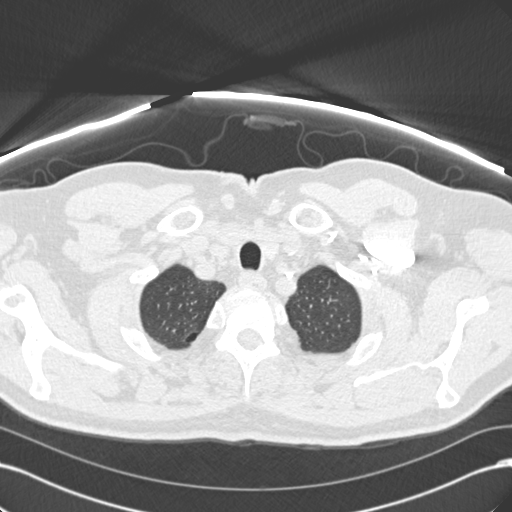
[im 270/285  mediastinal]
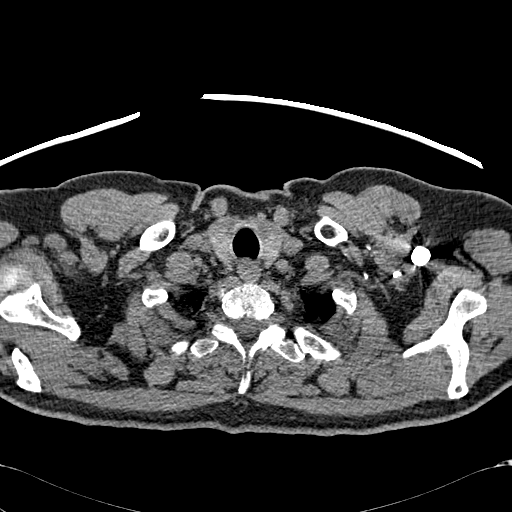

[Series 602: coronal mpr · coronal · 0.70mm/px · 1 of 125 slices shown]
[im 63/125  mediastinal]
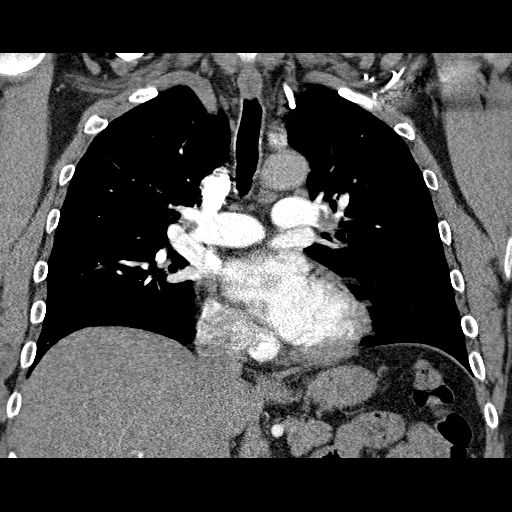

[19 of 36 positions shown; findings below may reference images not displayed]

FINDINGS: Pulmonary embolism is seen in the right and left main
pulmonary arteries as well as most segmental pulmonary artery
branches. The

No evidence of thoracic aortic aneurysm or dissection.  No mass or
lymphadenopathy identified.

Mild scarring is seen in the lateral aspect the right lower lobe.
No evidence of pulmonary consolidation or mass.  No evidence of
pleural or pericardial effusion.

Review of the MIP images confirms the above findings.
IMPRESSION: 1.  Positive for bilateral pulmonary embolism.
2.  No other acute findings.  Right lower lobe scarring.

Critical test results telephoned to Dr. Nazrina in the emergency
department at the time of interpretation on 06/23/2010 at 1669
hours.

## 2012-04-10 ENCOUNTER — Other Ambulatory Visit: Payer: Self-pay | Admitting: Internal Medicine

## 2012-04-10 ENCOUNTER — Ambulatory Visit (INDEPENDENT_AMBULATORY_CARE_PROVIDER_SITE_OTHER): Payer: Self-pay | Admitting: Family

## 2012-04-10 DIAGNOSIS — I2699 Other pulmonary embolism without acute cor pulmonale: Secondary | ICD-10-CM

## 2012-04-10 LAB — POCT INR: INR: 2.7

## 2012-04-10 MED ORDER — EPINEPHRINE 0.3 MG/0.3ML IJ DEVI: 0.3000 mg | Freq: Once | INTRAMUSCULAR | Status: DC

## 2012-04-10 NOTE — Patient Instructions (Addendum)
Same dose Check in 6 weeks   Latest dosing instructions   Total Sun Mon Tue Wed Thu Fri Sat   80 10 mg 15 mg 10 mg 10 mg 15 mg 10 mg 10 mg    (10 mg1) (10 mg1.5) (10 mg1) (10 mg1) (10 mg1.5) (10 mg1) (10 mg1)

## 2012-05-01 ENCOUNTER — Encounter: Payer: Self-pay | Admitting: Family

## 2012-05-01 ENCOUNTER — Ambulatory Visit (INDEPENDENT_AMBULATORY_CARE_PROVIDER_SITE_OTHER): Payer: BC Managed Care – PPO | Admitting: Internal Medicine

## 2012-05-01 ENCOUNTER — Encounter: Payer: Self-pay | Admitting: Internal Medicine

## 2012-05-01 ENCOUNTER — Ambulatory Visit: Payer: Self-pay | Admitting: Family

## 2012-05-01 VITALS — BP 150/96 | HR 76 | Temp 98.6°F | Resp 16 | Ht 76.0 in | Wt 238.0 lb

## 2012-05-01 DIAGNOSIS — I2699 Other pulmonary embolism without acute cor pulmonale: Secondary | ICD-10-CM

## 2012-05-01 DIAGNOSIS — Z86718 Personal history of other venous thrombosis and embolism: Secondary | ICD-10-CM

## 2012-05-01 DIAGNOSIS — D682 Hereditary deficiency of other clotting factors: Secondary | ICD-10-CM

## 2012-05-01 DIAGNOSIS — E785 Hyperlipidemia, unspecified: Secondary | ICD-10-CM

## 2012-05-01 MED ORDER — RIVAROXABAN 20 MG PO TABS: 20.0000 mg | ORAL_TABLET | Freq: Every day | ORAL | Status: DC

## 2012-05-01 NOTE — Patient Instructions (Addendum)
The patient is instructed to continue all medications as prescribed. Schedule followup with check out clerk upon leaving the clinic These measure blood pressure daily until you see a pattern of stable blood pressures that he can do it once or twice a week you're looking for numbers below 140/90 on a regular basis if on a regular basis her blood pressure is above 140/90 please notify the office if a message for Rodney Scott that we need to work he went to discuss blood pressure medications

## 2012-05-01 NOTE — Patient Instructions (Signed)
Coumadin Therapy D/C by Dr. Lovell Sheehan. Will try Xarelto

## 2012-05-01 NOTE — Progress Notes (Signed)
Subjective:    Patient ID: Rodney Scott, male    DOB: 10/09/1951, 61 y.o.   MRN: [id number]  HPI Follow up of blood pressure and CV risks patient to monitor blood pressure Invest in cuff Has hypercoagulable syndrome on coumadin Discussion of the use of xarelto vs coumadin    Review of Systems  Constitutional: Negative for fever and fatigue.  HENT: Negative for hearing loss, congestion, neck pain and postnasal drip.   Eyes: Negative for discharge, redness and visual disturbance.  Respiratory: Negative for cough, shortness of breath and wheezing.   Cardiovascular: Negative for leg swelling.  Gastrointestinal: Negative for abdominal pain, constipation and abdominal distention.  Genitourinary: Negative for urgency and frequency.  Musculoskeletal: Negative for joint swelling and arthralgias.  Skin: Negative for color change and rash.  Neurological: Negative for weakness and light-headedness.  Hematological: Negative for adenopathy.  Psychiatric/Behavioral: Negative for behavioral problems.   / Past Medical History  Diagnosis Date  . DVT (deep venous thrombosis)   . Allergic rhinitis   . Hyperlipidemia   . Factor V deficiency   . Colon polyps   . Osteoarthritis     History   Social History  . Marital Status: Married    Spouse Name: N/A    Number of Children: N/A  . Years of Education: N/A   Occupational History  . Not on file.   Social History Main Topics  . Smoking status: Current Everyday Smoker  . Smokeless tobacco: Not on file  . Alcohol Use: Yes  . Drug Use: No  . Sexually Active: Yes   Other Topics Concern  . Not on file   Social History Narrative  . No narrative on file    No past surgical history on file.  Family History  Problem Relation Age of Onset  . Factor V Leiden deficiency    . Cancer Father     Allergies  Allergen Reactions  . Yellow Jacket Venom (Bee Venom) Shortness Of Breath    Had swelling localized, chest tightness, and  shortness of breath  . Cephalexin     REACTION: solar rash    Current Outpatient Prescriptions on File Prior to Visit  Medication Sig Dispense Refill  . buPROPion (WELLBUTRIN XL) 150 MG 24 hr tablet TAKE ONE TABLET BY MOUTH EVERY DAY  90 tablet  3  . Loratadine 10 MG CAPS Take by mouth daily.        . meloxicam (MOBIC) 15 MG tablet TAKE ONE TABLET BY MOUTH EVERY DAY  30 tablet  6  . rosuvastatin (CRESTOR) 20 MG tablet Take 1 tablet (20 mg total) by mouth daily.  30 tablet  11  . warfarin (COUMADIN) 10 MG tablet Take 1 tablet (10 mg total) by mouth daily. Or as directed by doctor  90 tablet  6    BP 150/96  Pulse 76  Temp 98.6 F (37 C)  Resp 16  Ht 6\' 4"  (1.93 m)  Wt 238 lb (107.956 kg)  BMI 28.97 kg/m2       Objective:   Physical Exam  Nursing note and vitals reviewed. Constitutional: He is oriented to person, place, and time. He appears well-developed and well-nourished.  HENT:  Head: Normocephalic and atraumatic.  Cardiovascular: Normal rate and regular rhythm.   Abdominal: Soft. Bowel sounds are normal.  Neurological: He is alert and oriented to person, place, and time.  Skin: Skin is warm and dry.  Psychiatric: He has a normal mood and affect. His behavior is  normal.          Assessment & Plan:  Measure blood pressure and report If elevated at home will initiate therapy Stable lipids on crestor New allergy to yellow jackets noted now has epi pen  Discussion of replacement of coumadin with xarelto

## 2012-05-08 ENCOUNTER — Other Ambulatory Visit: Payer: Self-pay | Admitting: Internal Medicine

## 2012-05-08 DIAGNOSIS — Z86718 Personal history of other venous thrombosis and embolism: Secondary | ICD-10-CM

## 2012-05-08 DIAGNOSIS — D682 Hereditary deficiency of other clotting factors: Secondary | ICD-10-CM

## 2012-05-08 DIAGNOSIS — I2699 Other pulmonary embolism without acute cor pulmonale: Secondary | ICD-10-CM

## 2012-05-08 MED ORDER — RIVAROXABAN 20 MG PO TABS: 20.0000 mg | ORAL_TABLET | Freq: Every day | ORAL | Status: DC

## 2012-05-08 NOTE — Telephone Encounter (Signed)
Pt was given samples of xarelto 20mg . Please call new rx in [facility] [place] [phone]

## 2012-05-08 NOTE — Telephone Encounter (Signed)
Sent to pharmacy 

## 2012-05-22 ENCOUNTER — Encounter: Payer: Self-pay | Admitting: Family

## 2012-07-24 NOTE — Progress Notes (Signed)
  Subjective:    Patient ID: Rodney Scott, male    DOB: 07-Jun-1951, 61 y.o.   MRN: [id number]  HPI    Review of Systems     Objective:   Physical Exam        Assessment & Plan:  No charge Medications review

## 2012-08-07 ENCOUNTER — Ambulatory Visit (INDEPENDENT_AMBULATORY_CARE_PROVIDER_SITE_OTHER): Payer: BC Managed Care – PPO | Admitting: Internal Medicine

## 2012-08-07 ENCOUNTER — Encounter: Payer: Self-pay | Admitting: Internal Medicine

## 2012-08-07 VITALS — BP 160/86 | HR 72 | Temp 98.2°F | Resp 16 | Ht 76.0 in | Wt 238.0 lb

## 2012-08-07 DIAGNOSIS — I10 Essential (primary) hypertension: Secondary | ICD-10-CM

## 2012-08-07 DIAGNOSIS — T887XXA Unspecified adverse effect of drug or medicament, initial encounter: Secondary | ICD-10-CM

## 2012-08-07 DIAGNOSIS — I872 Venous insufficiency (chronic) (peripheral): Secondary | ICD-10-CM

## 2012-08-07 DIAGNOSIS — M1990 Unspecified osteoarthritis, unspecified site: Secondary | ICD-10-CM

## 2012-08-07 DIAGNOSIS — E785 Hyperlipidemia, unspecified: Secondary | ICD-10-CM

## 2012-08-07 DIAGNOSIS — Z23 Encounter for immunization: Secondary | ICD-10-CM

## 2012-08-07 MED ORDER — BENAZEPRIL HCL 20 MG PO TABS: 20.0000 mg | ORAL_TABLET | Freq: Every day | ORAL | Status: DC

## 2012-08-07 MED ORDER — ETODOLAC ER 500 MG PO TB24: 500.0000 mg | ORAL_TABLET | Freq: Every day | ORAL | Status: DC

## 2012-08-07 NOTE — Patient Instructions (Signed)
New blood pressure medication is Lotensin one a day in the evening before bed

## 2012-08-07 NOTE — Progress Notes (Signed)
**Note Rodney-Identified via Obfuscation**   Subjective:    Patient ID: Rodney Scott, male    DOB: [birth year], 61 y.o.   MRN: [id number]  HPI    Review of Systems     Objective:   Physical Exam        Assessment & Plan:  Change to etodolac for OA New diagnosis of HTN  Begin lotensin 20 mg po daily Monitor bmet

## 2012-08-07 NOTE — Progress Notes (Signed)
  Subjective:    Patient ID: Rodney Scott, male    DOB: 06-03-1951, 61 y.o.   MRN: [id number]  HPI monitering for increased OA pain in joints and failure of meloxicam   Review of Systems  Constitutional: Negative for fever and fatigue.  HENT: Negative for hearing loss, congestion, neck pain and postnasal drip.   Eyes: Negative for discharge, redness and visual disturbance.  Respiratory: Negative for cough, shortness of breath and wheezing.   Cardiovascular: Negative for leg swelling.  Gastrointestinal: Negative for abdominal pain, constipation and abdominal distention.  Genitourinary: Negative for urgency and frequency.  Musculoskeletal: Positive for arthralgias.  Skin: Negative for color change and rash.  Neurological: Negative for weakness and light-headedness.  Hematological: Negative for adenopathy.  Psychiatric/Behavioral: Negative for behavioral problems.   Past Medical History  Diagnosis Date  . DVT (deep venous thrombosis)   . Allergic rhinitis   . Hyperlipidemia   . Factor V deficiency   . Colon polyps   . Osteoarthritis     History   Social History  . Marital Status: Married    Spouse Name: N/A    Number of Children: N/A  . Years of Education: N/A   Occupational History  . Not on file.   Social History Main Topics  . Smoking status: Current Every Day Smoker  . Smokeless tobacco: Not on file  . Alcohol Use: Yes  . Drug Use: No  . Sexually Active: Yes   Other Topics Concern  . Not on file   Social History Narrative  . No narrative on file    No past surgical history on file.  Family History  Problem Relation Age of Onset  . Factor V Leiden deficiency    . Cancer Father     Allergies  Allergen Reactions  . Yellow Jacket Venom (Bee Venom) Shortness Of Breath    Had swelling localized, chest tightness, and shortness of breath  . Cephalexin     REACTION: solar rash    Current Outpatient Prescriptions on File Prior to Visit  Medication Sig  Dispense Refill  . buPROPion (WELLBUTRIN XL) 150 MG 24 hr tablet TAKE ONE TABLET BY MOUTH EVERY DAY  90 tablet  3  . Loratadine 10 MG CAPS Take by mouth daily.        . Rivaroxaban (XARELTO) 20 MG TABS Take 1 tablet (20 mg total) by mouth daily.  30 tablet  6  . rosuvastatin (CRESTOR) 20 MG tablet Take 1 tablet (20 mg total) by mouth daily.  30 tablet  11  . benazepril (LOTENSIN) 20 MG tablet Take 1 tablet (20 mg total) by mouth daily.  90 tablet  3    BP 160/86  Pulse 72  Temp 98.2 F (36.8 C)  Resp 16  Ht 6\' 4"  (1.93 m)  Wt 238 lb (107.956 kg)  BMI 28.97 kg/m2       Objective:   Physical Exam  Nursing note and vitals reviewed. Constitutional: He appears well-developed and well-nourished.  HENT:  Head: Normocephalic and atraumatic.  Eyes: Conjunctivae normal are normal. Pupils are equal, round, and reactive to light.  Neck: Normal range of motion. Neck supple.  Cardiovascular: Normal rate and regular rhythm.   Murmur heard. Pulmonary/Chest: Effort normal and breath sounds normal.  Abdominal: Soft. Bowel sounds are normal.  Musculoskeletal: He exhibits edema and tenderness.  Neurological: He is alert.          Assessment & Plan:

## 2012-10-30 ENCOUNTER — Ambulatory Visit (INDEPENDENT_AMBULATORY_CARE_PROVIDER_SITE_OTHER): Payer: BC Managed Care – PPO | Admitting: Internal Medicine

## 2012-10-30 ENCOUNTER — Encounter: Payer: Self-pay | Admitting: Internal Medicine

## 2012-10-30 VITALS — BP 124/80 | HR 50 | Temp 98.0°F | Resp 16 | Ht 76.0 in | Wt 242.0 lb

## 2012-10-30 DIAGNOSIS — F341 Dysthymic disorder: Secondary | ICD-10-CM

## 2012-10-30 DIAGNOSIS — H9319 Tinnitus, unspecified ear: Secondary | ICD-10-CM

## 2012-10-30 DIAGNOSIS — E785 Hyperlipidemia, unspecified: Secondary | ICD-10-CM

## 2012-10-30 DIAGNOSIS — F329 Major depressive disorder, single episode, unspecified: Secondary | ICD-10-CM

## 2012-10-30 DIAGNOSIS — T45515A Adverse effect of anticoagulants, initial encounter: Secondary | ICD-10-CM

## 2012-10-30 LAB — LIPID PANEL
Cholesterol: 192 mg/dL (ref 0–200)
HDL: 56.4 mg/dL (ref >39.00)
LDL Cholesterol: 97 mg/dL (ref 0–99)
Triglycerides: 195 mg/dL — ABNORMAL HIGH (ref 0.0–149.0)

## 2012-10-30 LAB — CBC WITH DIFFERENTIAL/PLATELET
Basophils Absolute: 0 10*3/uL (ref 0.0–0.1)
Eosinophils Absolute: 0.1 10*3/uL (ref 0.0–0.7)
HCT: 46.3 % (ref 39.0–52.0)
Hemoglobin: 15.6 g/dL (ref 13.0–17.0)
Lymphs Abs: 2.9 10*3/uL (ref 0.7–4.0)
MCHC: 33.8 g/dL (ref 30.0–36.0)
Monocytes Absolute: 0.9 10*3/uL (ref 0.1–1.0)
Neutro Abs: 5.8 10*3/uL (ref 1.4–7.7)
Platelets: 195 10*3/uL (ref 150.0–400.0)
RDW: 13.5 % (ref 11.5–14.6)

## 2012-10-30 MED ORDER — BUPROPION HCL ER (XL) 300 MG PO TB24: 300.0000 mg | ORAL_TABLET | ORAL | Status: DC

## 2012-10-30 NOTE — Patient Instructions (Signed)
The patient is instructed to continue all medications as prescribed. Schedule followup with check out clerk upon leaving the clinic  

## 2012-10-30 NOTE — Progress Notes (Signed)
Subjective:    Patient ID: Rodney Scott, male    DOB: 1951-07-20, 62 y.o.   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  He is doing well all in all anticoagulants.  He is here today for appropriate monitoring.  He has a chief complaint today of tendinitis in both ears but greater in the right ear  Notes increased anxiety  Review of Systems  Constitutional: Negative for fever and fatigue.  HENT: Negative for hearing loss, congestion, neck pain and postnasal drip.   Eyes: Negative for discharge, redness and visual disturbance.  Respiratory: Negative for cough, shortness of breath and wheezing.   Cardiovascular: Negative for leg swelling.  Gastrointestinal: Negative for abdominal pain, constipation and abdominal distention.  Genitourinary: Negative for urgency and frequency.  Musculoskeletal: Negative for joint swelling and arthralgias.  Skin: Negative for color change and rash.  Neurological: Negative for weakness and light-headedness.  Hematological: Negative for adenopathy.  Psychiatric/Behavioral: Negative for behavioral problems.   Past Medical History  Diagnosis Date  . DVT (deep venous thrombosis)   . Allergic rhinitis   . Hyperlipidemia   . Factor V deficiency   . Colon polyps   . Osteoarthritis     History   Social History  . Marital Status: Married    Spouse Name: N/A    Number of Children: N/A  . Years of Education: N/A   Occupational History  . Not on file.   Social History Main Topics  . Smoking status: Current Every Day Smoker  . Smokeless tobacco: Not on file  . Alcohol Use: Yes  . Drug Use: No  . Sexually Active: Yes   Other Topics Concern  . Not on file   Social History Narrative  . No narrative on file    No past surgical history on file.  Family History  Problem Relation Age of Onset  . Factor V Leiden deficiency      . Cancer Father     Allergies  Allergen Reactions  . Yellow Jacket Venom (Bee Venom) Shortness Of Breath    Had swelling localized, chest tightness, and shortness of breath  . Cephalexin     REACTION: solar rash    Current Outpatient Prescriptions on File Prior to Visit  Medication Sig Dispense Refill  . benazepril (LOTENSIN) 20 MG tablet Take 1 tablet (20 mg total) by mouth daily.  90 tablet  3  . buPROPion (WELLBUTRIN XL) 150 MG 24 hr tablet TAKE ONE TABLET BY MOUTH EVERY DAY  90 tablet  3  . etodolac (LODINE XL) 500 MG 24 hr tablet Take 1 tablet (500 mg total) by mouth daily.  30 tablet  11  . Loratadine 10 MG CAPS Take by mouth daily.        . Rivaroxaban (XARELTO) 20 MG TABS Take 1 tablet (20 mg total) by mouth daily.  30 tablet  6  . rosuvastatin (CRESTOR) 20 MG tablet Take 1 tablet (20 mg total) by mouth daily.  30 tablet  11    BP 124/80  Pulse 50  Temp 98 F (36.7 C)  Resp 16  Ht 6\' 4"  (1.93 m)  Wt 242 lb (109.77 kg)  BMI 29.46 kg/m2       Objective:   Physical Exam  Nursing note and vitals reviewed. Constitutional: He appears well-developed and well-nourished.  HENT:  Head: Normocephalic and atraumatic.  Eyes: Conjunctivae  normal are normal. Pupils are equal, round, and reactive to light.  Neck: Normal range of motion. Neck supple.  Cardiovascular: Normal rate and regular rhythm.   Pulmonary/Chest: Effort normal and breath sounds normal.  Abdominal: Soft. Bowel sounds are normal.    Increased depression and naxiety      Assessment & Plan:  HTN stable Lipid monitoring On anticoagulant for hx of DVT with clotting disorder increased stress and anxiety

## 2012-12-27 ENCOUNTER — Other Ambulatory Visit: Payer: Self-pay | Admitting: Internal Medicine

## 2013-01-20 ENCOUNTER — Encounter: Payer: Self-pay | Admitting: Gastroenterology

## 2013-02-26 ENCOUNTER — Ambulatory Visit (INDEPENDENT_AMBULATORY_CARE_PROVIDER_SITE_OTHER): Payer: BC Managed Care – PPO | Admitting: Internal Medicine

## 2013-02-26 ENCOUNTER — Encounter: Payer: Self-pay | Admitting: Internal Medicine

## 2013-02-26 VITALS — BP 144/90 | HR 76 | Temp 98.2°F | Resp 16 | Ht 76.0 in | Wt 236.0 lb

## 2013-02-26 DIAGNOSIS — M1990 Unspecified osteoarthritis, unspecified site: Secondary | ICD-10-CM

## 2013-02-26 DIAGNOSIS — E785 Hyperlipidemia, unspecified: Secondary | ICD-10-CM

## 2013-02-26 DIAGNOSIS — F4323 Adjustment disorder with mixed anxiety and depressed mood: Secondary | ICD-10-CM

## 2013-02-26 DIAGNOSIS — F331 Major depressive disorder, recurrent, moderate: Secondary | ICD-10-CM

## 2013-02-26 MED ORDER — MIRTAZAPINE 15 MG PO TBDP: 15.0000 mg | ORAL_TABLET | Freq: Every day | ORAL | Status: DC

## 2013-02-26 NOTE — Progress Notes (Signed)
Subjective:    Patient ID: Rodney Scott, male    DOB: Feb 13, 1951, 62 y.o.   MRN: [id number]  HPIblood pressure stable on current medications.  He is on a novel anticoagulant for history of PE and hypercoagulable syndrome.  He is on Crestor for hyperlipidemia.  He is on no medications for anxiety or depression Sleep is "OK" mirtazepam trial     Review of Systems  Constitutional: Negative for fever and fatigue.  HENT: Negative for hearing loss, congestion, neck pain and postnasal drip.   Eyes: Negative for discharge, redness and visual disturbance.  Respiratory: Negative for cough, shortness of breath and wheezing.   Cardiovascular: Negative for leg swelling.  Gastrointestinal: Negative for abdominal pain, constipation and abdominal distention.  Genitourinary: Negative for urgency and frequency.  Musculoskeletal: Negative for joint swelling and arthralgias.  Skin: Negative for color change and rash.  Neurological: Negative for weakness and light-headedness.  Hematological: Negative for adenopathy.  Psychiatric/Behavioral: Positive for behavioral problems, confusion, decreased concentration and agitation.   Past Medical History  Diagnosis Date  . DVT (deep venous thrombosis)   . Allergic rhinitis   . Hyperlipidemia   . Factor V deficiency   . Colon polyps   . Osteoarthritis     History   Social History  . Marital Status: Married    Spouse Name: N/A    Number of Children: N/A  . Years of Education: N/A   Occupational History  . Not on file.   Social History Main Topics  . Smoking status: Current Every Day Smoker  . Smokeless tobacco: Not on file  . Alcohol Use: Yes  . Drug Use: No  . Sexually Active: Yes   Other Topics Concern  . Not on file   Social History Narrative  . No narrative on file    No past surgical history on file.  Family History  Problem Relation Age of Onset  . Factor V Leiden deficiency    . Cancer Father     Allergies   Allergen Reactions  . Yellow Jacket Venom (Bee Venom) Shortness Of Breath    Had swelling localized, chest tightness, and shortness of breath  . Cephalexin     REACTION: solar rash    Current Outpatient Prescriptions on File Prior to Visit  Medication Sig Dispense Refill  . benazepril (LOTENSIN) 20 MG tablet Take 1 tablet (20 mg total) by mouth daily.  90 tablet  3  . Loratadine 10 MG CAPS Take by mouth daily.        . meloxicam (MOBIC) 15 MG tablet TAKE ONE TABLET BY MOUTH EVERY DAY  30 tablet  3  . Rivaroxaban (XARELTO) 20 MG TABS Take 1 tablet (20 mg total) by mouth daily.  30 tablet  6  . rosuvastatin (CRESTOR) 20 MG tablet Take 1 tablet (20 mg total) by mouth daily.  30 tablet  11   No current facility-administered medications on file prior to visit.    BP 144/90  Pulse 76  Temp(Src) 98.2 F (36.8 C)  Resp 16  Ht 6\' 4"  (1.93 m)  Wt 236 lb (107.049 kg)  BMI 28.74 kg/m2       Objective:   Physical Exam  Nursing note and vitals reviewed. Constitutional: He appears well-developed and well-nourished.  HENT:  Head: Normocephalic and atraumatic.  Eyes: Conjunctivae are normal. Pupils are equal, round, and reactive to light.  Neck: Normal range of motion. Neck supple.  Cardiovascular: Normal rate and regular rhythm.   Pulmonary/Chest:  Effort normal and breath sounds normal.  Abdominal: Soft. Bowel sounds are normal.          Assessment & Plan:  Lipids management Moderate anxiety and depression Trial of  mirtazepam for insomnia and anxiety

## 2013-02-26 NOTE — Patient Instructions (Addendum)
The patient is instructed to continue all medications as prescribed. Schedule followup with check out clerk upon leaving the clinic  

## 2013-05-03 ENCOUNTER — Other Ambulatory Visit: Payer: Self-pay | Admitting: *Deleted

## 2013-05-03 MED ORDER — MELOXICAM 15 MG PO TABS: ORAL_TABLET | ORAL | Status: DC

## 2013-06-06 ENCOUNTER — Other Ambulatory Visit: Payer: Self-pay | Admitting: Internal Medicine

## 2013-08-13 ENCOUNTER — Other Ambulatory Visit (INDEPENDENT_AMBULATORY_CARE_PROVIDER_SITE_OTHER): Payer: BC Managed Care – PPO

## 2013-08-13 DIAGNOSIS — Z0000 Encounter for general adult medical examination without abnormal findings: Secondary | ICD-10-CM

## 2013-08-13 LAB — CBC WITH DIFFERENTIAL/PLATELET
Basophils Relative: 0.5 % (ref 0.0–3.0)
Eosinophils Relative: 3.4 % (ref 0.0–5.0)
HCT: 44.3 % (ref 39.0–52.0)
MCV: 95.4 fl (ref 78.0–100.0)
Monocytes Absolute: 0.7 10*3/uL (ref 0.1–1.0)
Monocytes Relative: 11.2 % (ref 3.0–12.0)
Neutrophils Relative %: 50.1 % (ref 43.0–77.0)
RBC: 4.65 Mil/uL (ref 4.22–5.81)
WBC: 6.5 10*3/uL (ref 4.5–10.5)

## 2013-08-13 LAB — POCT URINALYSIS DIPSTICK
Blood, UA: NEGATIVE
Glucose, UA: NEGATIVE
Leukocytes, UA: NEGATIVE
Nitrite, UA: NEGATIVE
Protein, UA: NEGATIVE
Spec Grav, UA: 1.025
Urobilinogen, UA: 0.2
pH, UA: 6

## 2013-08-13 LAB — LIPID PANEL
Cholesterol: 179 mg/dL (ref 0–200)
LDL Cholesterol: 105 mg/dL — ABNORMAL HIGH (ref 0–99)
Total CHOL/HDL Ratio: 4
Triglycerides: 144 mg/dL (ref 0.0–149.0)
VLDL: 28.8 mg/dL (ref 0.0–40.0)

## 2013-08-13 LAB — BASIC METABOLIC PANEL
BUN: 21 mg/dL (ref 6–23)
Calcium: 9.3 mg/dL (ref 8.4–10.5)
Chloride: 106 mEq/L (ref 96–112)
Creatinine, Ser: 0.9 mg/dL (ref 0.4–1.5)
GFR: 90.99 mL/min (ref >60.00)
Glucose, Bld: 97 mg/dL (ref 70–99)
Potassium: 4.9 mEq/L (ref 3.5–5.1)

## 2013-08-13 LAB — TSH: TSH: 1.06 u[IU]/mL (ref 0.35–5.50)

## 2013-08-13 LAB — HEPATIC FUNCTION PANEL
ALT: 28 U/L (ref 0–53)
AST: 21 U/L (ref 0–37)
Albumin: 3.4 g/dL — ABNORMAL LOW (ref 3.5–5.2)
Alkaline Phosphatase: 52 U/L (ref 39–117)
Total Protein: 5.9 g/dL — ABNORMAL LOW (ref 6.0–8.3)

## 2013-08-15 ENCOUNTER — Other Ambulatory Visit: Payer: Self-pay | Admitting: Internal Medicine

## 2013-08-16 ENCOUNTER — Encounter: Payer: BC Managed Care – PPO | Admitting: Internal Medicine

## 2013-08-20 ENCOUNTER — Ambulatory Visit (INDEPENDENT_AMBULATORY_CARE_PROVIDER_SITE_OTHER): Payer: BC Managed Care – PPO | Admitting: Internal Medicine

## 2013-08-20 ENCOUNTER — Encounter: Payer: Self-pay | Admitting: Internal Medicine

## 2013-08-20 VITALS — BP 120/80 | HR 96 | Temp 98.0°F | Resp 16 | Ht 77.0 in | Wt 246.0 lb

## 2013-08-20 DIAGNOSIS — Z23 Encounter for immunization: Secondary | ICD-10-CM

## 2013-08-20 DIAGNOSIS — Z0000 Encounter for general adult medical examination without abnormal findings: Secondary | ICD-10-CM

## 2013-08-20 NOTE — Patient Instructions (Signed)
The patient is instructed to continue all medications as prescribed. Schedule followup with check out clerk upon leaving the clinic  

## 2013-08-20 NOTE — Progress Notes (Signed)
Subjective:    Patient ID: Rodney Scott, male    DOB: 04-25-1951, 62 y.o.   MRN: [id number]  HPI This is a 62 year old male who presents for his annual examination he is also treated for hypertension and hypercoagulable syndrome on oral anti-coagulant history of mild to moderate insomnia without moderate depression and anxiety.  He is on Wellbutrin and Remeron  Also has moderate hypertension on Lotensin 20 mg  Review of Systems  Constitutional: Negative for fever and fatigue.  HENT: Negative for congestion, hearing loss and postnasal drip.   Eyes: Negative for discharge, redness and visual disturbance.  Respiratory: Negative for cough, shortness of breath and wheezing.   Cardiovascular: Negative for leg swelling.  Gastrointestinal: Negative for abdominal pain, constipation and abdominal distention.  Genitourinary: Negative for urgency and frequency.  Musculoskeletal: Negative for arthralgias, joint swelling and neck pain.  Skin: Negative for color change and rash.  Neurological: Negative for weakness and light-headedness.  Hematological: Negative for adenopathy.  Psychiatric/Behavioral: Negative for behavioral problems.   Past Medical History  Diagnosis Date  . DVT (deep venous thrombosis)   . Allergic rhinitis   . Hyperlipidemia   . Factor V deficiency   . Colon polyps   . Osteoarthritis     History   Social History  . Marital Status: Married    Spouse Name: N/A    Number of Children: N/A  . Years of Education: N/A   Occupational History  . Not on file.   Social History Main Topics  . Smoking status: Current Every Day Smoker  . Smokeless tobacco: Not on file  . Alcohol Use: Yes  . Drug Use: No  . Sexual Activity: Yes   Other Topics Concern  . Not on file   Social History Narrative  . No narrative on file    History reviewed. No pertinent past surgical history.  Family History  Problem Relation Age of Onset  . Factor V Leiden deficiency    . Cancer  Father     Allergies  Allergen Reactions  . Yellow Jacket Venom [Bee Venom] Shortness Of Breath    Had swelling localized, chest tightness, and shortness of breath  . Cephalexin     REACTION: solar rash    Current Outpatient Prescriptions on File Prior to Visit  Medication Sig Dispense Refill  . benazepril (LOTENSIN) 20 MG tablet TAKE ONE TABLET BY MOUTH EVERY DAY  90 tablet  0  . Loratadine 10 MG CAPS Take by mouth daily.        . meloxicam (MOBIC) 15 MG tablet TAKE ONE TABLET BY MOUTH EVERY DAY  90 tablet  3  . mirtazapine (REMERON SOL-TAB) 15 MG disintegrating tablet Take 1 tablet (15 mg total) by mouth at bedtime.  30 tablet  3  . rosuvastatin (CRESTOR) 20 MG tablet Take 1 tablet (20 mg total) by mouth daily.  30 tablet  11  . XARELTO 20 MG TABS tablet TAKE ONE TABLET BY MOUTH EVERY DAY  30 tablet  6   No current facility-administered medications on file prior to visit.    BP 120/80  Pulse 96  Temp(Src) 98 F (36.7 C)  Resp 16  Ht 6\' 5"  (1.956 m)  Wt 246 lb (111.585 kg)  BMI 29.17 kg/m2       Objective:   Physical Exam  Nursing note and vitals reviewed. Constitutional: He is oriented to person, place, and time. He appears well-developed and well-nourished.  HENT:  Head: Normocephalic and atraumatic.  Eyes: Conjunctivae are normal. Pupils are equal, round, and reactive to light.  Neck: Normal range of motion. Neck supple.  Cardiovascular: Normal rate and regular rhythm.   Pulmonary/Chest: Effort normal and breath sounds normal.  Abdominal: Soft. Bowel sounds are normal.  Genitourinary: Rectum normal and prostate normal.  Musculoskeletal: Normal range of motion. He exhibits edema.  Foot pain in outer left foot  Neurological: He is alert and oriented to person, place, and time.  Skin: Rash noted.  Psychiatric: He has a normal mood and affect. His behavior is normal.          Assessment & Plan:   Patient presents for yearly preventative medicine  examination.   all immunizations and health maintenance protocols were reviewed with the patient and they are up to date with these protocols.   screening laboratory values were reviewed with the patient including screening of hyperlipidemia PSA renal function and hepatic function.   There medications past medical history social history problem list and allergies were reviewed in detail.   Goals were established with regard to weight loss exercise diet in compliance with medications  Foot pain with bunions

## 2013-08-20 NOTE — Progress Notes (Signed)
Pre-visit discussion using our clinic review tool. No additional management support is needed unless otherwise documented below in the visit note.    pcm

## 2013-11-01 ENCOUNTER — Other Ambulatory Visit: Payer: Self-pay | Admitting: Internal Medicine

## 2013-11-18 ENCOUNTER — Emergency Department (HOSPITAL_COMMUNITY)
Admission: EM | Admit: 2013-11-18 | Discharge: 2013-11-18 | Disposition: A | Discharge status: Home / Self Care | Payer: Worker's Compensation | Attending: Emergency Medicine | Admitting: Emergency Medicine

## 2013-11-18 ENCOUNTER — Encounter (HOSPITAL_COMMUNITY): Payer: Self-pay | Admitting: Emergency Medicine

## 2013-11-18 DIAGNOSIS — F17200 Nicotine dependence, unspecified, uncomplicated: Secondary | ICD-10-CM | POA: Insufficient documentation

## 2013-11-18 DIAGNOSIS — Z791 Long term (current) use of non-steroidal anti-inflammatories (NSAID): Secondary | ICD-10-CM | POA: Insufficient documentation

## 2013-11-18 DIAGNOSIS — M1990 Unspecified osteoarthritis, unspecified site: Secondary | ICD-10-CM | POA: Insufficient documentation

## 2013-11-18 DIAGNOSIS — Y939 Activity, unspecified: Secondary | ICD-10-CM | POA: Diagnosis not present

## 2013-11-18 DIAGNOSIS — Z860100 Personal history of colon polyps, unspecified: Secondary | ICD-10-CM | POA: Insufficient documentation

## 2013-11-18 DIAGNOSIS — Z7901 Long term (current) use of anticoagulants: Secondary | ICD-10-CM | POA: Diagnosis not present

## 2013-11-18 DIAGNOSIS — Z86718 Personal history of other venous thrombosis and embolism: Secondary | ICD-10-CM | POA: Insufficient documentation

## 2013-11-18 DIAGNOSIS — E785 Hyperlipidemia, unspecified: Secondary | ICD-10-CM | POA: Diagnosis not present

## 2013-11-18 DIAGNOSIS — Y990 Civilian activity done for income or pay: Secondary | ICD-10-CM | POA: Diagnosis not present

## 2013-11-18 DIAGNOSIS — Z862 Personal history of diseases of the blood and blood-forming organs and certain disorders involving the immune mechanism: Secondary | ICD-10-CM | POA: Diagnosis not present

## 2013-11-18 DIAGNOSIS — Z9109 Other allergy status, other than to drugs and biological substances: Secondary | ICD-10-CM | POA: Diagnosis not present

## 2013-11-18 DIAGNOSIS — S61309A Unspecified open wound of unspecified finger with damage to nail, initial encounter: Secondary | ICD-10-CM

## 2013-11-18 DIAGNOSIS — W230XXA Caught, crushed, jammed, or pinched between moving objects, initial encounter: Secondary | ICD-10-CM | POA: Insufficient documentation

## 2013-11-18 DIAGNOSIS — Y9289 Other specified places as the place of occurrence of the external cause: Secondary | ICD-10-CM | POA: Insufficient documentation

## 2013-11-18 DIAGNOSIS — S61209A Unspecified open wound of unspecified finger without damage to nail, initial encounter: Secondary | ICD-10-CM | POA: Insufficient documentation

## 2013-11-18 DIAGNOSIS — Z79899 Other long term (current) drug therapy: Secondary | ICD-10-CM | POA: Diagnosis not present

## 2013-11-18 DIAGNOSIS — S61319A Laceration without foreign body of unspecified finger with damage to nail, initial encounter: Secondary | ICD-10-CM

## 2013-11-18 MED ORDER — LIDOCAINE HCL (PF) 1 % IJ SOLN
INTRAMUSCULAR | Status: AC
  Administered 2013-11-18: 16:00:00
  Filled 2013-11-18: qty 5

## 2013-11-18 NOTE — ED Notes (Signed)
Laceration to right 2nd finger. Finger was slammed in door. This is workman's comp. Pt states tetanus shot is up date to date

## 2013-11-18 NOTE — Discharge Instructions (Signed)
Finger Avulsion  °When the tip of the finger is lost, a new nail may grow back if part of the fingernail is left. The new nail may be deformed. If just the tip of the finger is lost, no repair may be needed unless there is bone showing. If bone is showing, your caregiver may need to remove the protruding bone and put on a bandage. Your caregiver will do what is best for you. Most of the time when a fingertip is lost, the end will gradually grow back on and look fairly normal, but it may remain sensitive to pressure and temperature extremes for a long time. °HOME CARE INSTRUCTIONS  °· Keep your hand elevated above your heart to relieve pain and swelling. °· Keep your dressing dry and clean. °· Change your bandage in 24 hours or as directed. °· Only take over-the-counter or prescription medicines for pain, discomfort, or fever as directed by your caregiver. °· See your caregiver as needed for problems. °SEEK MEDICAL CARE IF:  °· You have increased pain, swelling, drainage, or bleeding. °· You have a fever. °· You have swelling that spreads from your finger and into your hand. °Make sure to check to see if you need a tetanus booster. °Document Released: 12/09/2001 Document Revised: 03/31/2012 Document Reviewed: 11/03/2008 °[employer]® Patient Information ©2014 [employer]. ° °Laceration Care, Adult °A laceration is a cut or lesion that goes through all layers of the skin and into the tissue just beneath the skin. °TREATMENT  °Some lacerations may not require closure. Some lacerations may not be able to be closed due to an increased risk of infection. It is important to see your caregiver as soon as possible after an injury to minimize the risk of infection and maximize the opportunity for successful closure. °If closure is appropriate, pain medicines may be given, if needed. The wound will be cleaned to help prevent infection. Your caregiver will use stitches (sutures), staples, wound glue (adhesive), or skin adhesive  strips to repair the laceration. These tools bring the skin edges together to allow for faster healing and a better cosmetic outcome. However, all wounds will heal with a scar. Once the wound has healed, scarring can be minimized by covering the wound with sunscreen during the day for 1 full year. °HOME CARE INSTRUCTIONS  °For sutures or staples: °· Keep the wound clean and dry. °· If you were given a bandage (dressing), you should change it at least once a day. Also, change the dressing if it becomes wet or dirty, or as directed by your caregiver. °· Wash the wound with soap and water 2 times a day. Rinse the wound off with water to remove all soap. Pat the wound dry with a clean towel. °· After cleaning, apply a thin layer of the antibiotic ointment as recommended by your caregiver. This will help prevent infection and keep the dressing from sticking. °· You may shower as usual after the first 24 hours. Do not soak the wound in water until the sutures are removed. °· Only take over-the-counter or prescription medicines for pain, discomfort, or fever as directed by your caregiver. °· Get your sutures or staples removed as directed by your caregiver. °For skin adhesive strips: °· Keep the wound clean and dry. °· Do not get the skin adhesive strips wet. You may bathe carefully, using caution to keep the wound dry. °· If the wound gets wet, pat it dry with a clean towel. °· Skin adhesive strips will fall off   on their own. You may trim the strips as the wound heals. Do not remove skin adhesive strips that are still stuck to the wound. They will fall off in time. For wound adhesive:  You may briefly wet your wound in the shower or bath. Do not soak or scrub the wound. Do not swim. Avoid periods of heavy perspiration until the skin adhesive has fallen off on its own. After showering or bathing, gently pat the wound dry with a clean towel.  Do not apply liquid medicine, cream medicine, or ointment medicine to your  wound while the skin adhesive is in place. This may loosen the film before your wound is healed.  If a dressing is placed over the wound, be careful not to apply tape directly over the skin adhesive. This may cause the adhesive to be pulled off before the wound is healed.  Avoid prolonged exposure to sunlight or tanning lamps while the skin adhesive is in place. Exposure to ultraviolet light in the first year will darken the scar.  The skin adhesive will usually remain in place for 5 to 10 days, then naturally fall off the skin. Do not pick at the adhesive film. You may need a tetanus shot if:  You cannot remember when you had your last tetanus shot.  You have never had a tetanus shot. If you get a tetanus shot, your arm may swell, get red, and feel warm to the touch. This is common and not a problem. If you need a tetanus shot and you choose not to have one, there is a rare chance of getting tetanus. Sickness from tetanus can be serious. SEEK MEDICAL CARE IF:   You have redness, swelling, or increasing pain in the wound.  You see a red line that goes away from the wound.  You have yellowish-white fluid (pus) coming from the wound.  You have a fever.  You notice a bad smell coming from the wound or dressing.  Your wound breaks open before or after sutures have been removed.  You notice something coming out of the wound such as wood or glass.  Your wound is on your hand or foot and you cannot move a finger or toe. SEEK IMMEDIATE MEDICAL CARE IF:   Your pain is not controlled with prescribed medicine.  You have severe swelling around the wound causing pain and numbness or a change in color in your arm, hand, leg, or foot.  Your wound splits open and starts bleeding.  You have worsening numbness, weakness, or loss of function of any joint around or beyond the wound.  You develop painful lumps near the wound or on the skin anywhere on your body. MAKE SURE YOU:   Understand  these instructions.  Will watch your condition.  Will get help right away if you are not doing well or get worse. Document Released: 09/30/2005 Document Revised: 12/23/2011 Document Reviewed: 03/26/2011 [facility] Patient Information 2014 [place], [place].  Nail Bed Injury The nail bed is the soft tissue under a fingernail or toenail that is the origin for new nail growth. Various types of injuries can occur at the nail bed. These injuries may involve bruising or bleeding under the nail, cuts (lacerations) in the nail or nail bed, or loss of a part of the nail or the whole nail (avulsion). In some cases, a nail bed injury accompanies another injury, such as a break (fracture) of the bone at the tip of the finger or toe. Nail bed injuries  are common in people who have jobs that require performing manual tasks with their hands, such as carpenters and landscapers.  The nail bed includes the growth center of the nail. If this growth center is damaged, the injured nail may not grow back normally if at all. The regrown nail might have an abnormal shape or appearance. It can take several months for a damaged or torn-off nail to regrow. Depending on the nature and extent of the nail bed injury, there may be a permanent disruption of normal nail growth. CAUSES  Damage to the nail bed area is usually caused by crushing, pinching, cutting, or tearing injuries of the fingertip or toe. For example, these injuries may occur when a fingertip gets caught in a door, hit by a hammer, or damaged in accidents involving electrical tools or power machinery.  SYMPTOMS  Symptoms vary depending on the nature of the injury. Symptoms may include:  Pain in the injured area.  Bleeding.  Swelling.  Discoloration.  Collection of blood under the nail (hematoma).  Deformed or split nail.  Loose nail (not stuck to the nail bed).  Loss of all or part of the nail. DIAGNOSIS  Your caregiver will take a medical history and  examine the injured area. You will be asked to describe how the injury occurred. X-rays may be done to see if you have a fracture. Your caregiver might also check for conditions that may affect healing, such as diabetes, nerve problems, or poor circulation.  TREATMENT  Treatment depends on the type of injury.  The injury may not require any special treatment other than keeping the area clean and free of infection.   Your caregiver may drain the collection of blood from under the nail. This can be done by making a small hole in the nail.   Your caregiver may remove all or part of your nail. This might be necessary to stitch (suture) any laceration in the nail bed. Before doing this, the caregiver will likely give you medication to numb the nail area (local anesthetic). In some cases, the caregiver may choose to numb the entire finger or toe (digital nerve block). Depending on the location and size of the nail bed injury, an avulsed nail is sometimes stitched back in place to provide temporary protection to the nail bed until the new nail grows in.  Your caregiver may apply bandages (dressings) or splints to the area.  You might be prescribed antibiotic medication to help prevent infection.  For certain injuries, your caregiver may direct you to see a hand or foot specialist.  You may need a tetanus shot if:  You cannot remember when you had your last tetanus shot.  You have never had a tetanus shot.  The injury broke your skin. If you get a tetanus shot, your arm may swell, get red, and feel warm to the touch. This is common and not a problem. If you need a tetanus shot and you choose not to have one, there is a rare chance of getting tetanus. Sickness from tetanus can be serious. HOME CARE INSTRUCTIONS   Keep your hand or foot raised (elevated) to relieve pain and swelling.   For an injured toenail, lie in bed or on a couch with your leg on pillows. You can also sit in a recliner with  your leg up. Avoid walking or letting your leg dangle. When you walk, wear an open-toe shoe.  For an injured fingernail, keep your hand above the level  of your heart. Use pillows on a table or on the arm of your chair while sitting. Use them on your bed while sleeping.   Keep your injury protected with dressings or splints as directed by your caregiver.   Keep any dressings clean and dry. Change or remove your dressings as directed by your caregiver.   Only take over-the-counter or prescription medications as directed by your caregiver. If you were prescribed antibiotics, take them as directed. Finish them even if you start to feel better.   Follow up with your caregiver as directed.  SEEK MEDICAL CARE IF:   You have pain that is not controlled with medication.   You have any problems caring for your injury.  SEEK IMMEDIATE MEDICAL CARE IF:   You have increased pain, drainage, or bleeding in the injured area.   You have redness, soreness, and swelling (inflammation) in the injured area.  You have a fever or persistent symptoms for more than 2 3 days.  You have a fever and your symptoms suddenly get worse.  You have swelling that spreads from your finger into your hand or from your toe into your foot.  MAKE SURE YOU:  Understand these instructions.  Will watch your condition.  Will get help right away if you are not doing well or get worse. Document Released: 11/07/2004 Document Revised: 01/25/2013 Document Reviewed: 10/22/2012 [facility] Patient Information 2014 [place], [place].

## 2013-11-18 NOTE — ED Provider Notes (Signed)
CSN: [id number]     Arrival date & time 11/18/13  1500 History   First MD Initiated Contact with Patient 11/18/13 1515     Chief Complaint  Patient presents with  . Extremity Laceration   (Consider location/radiation/quality/duration/timing/severity/associated sxs/prior Treatment) HPI Comments: Patient is a 63 year old male who presents to the emergency department with a laceration to his right index finger. Patient states his finger was between a truck door, not slammed, and when he went to pull away, his nail got stuck causing it to pull almost all the way off. States he immediately began to bleed, pain controlled at this time. He is on several to. Up-to-date on his tetanus shot. Denies pain into his finger, just at his nail bed. numbness or tingling.  The history is provided by the patient.    Past Medical History  Diagnosis Date  . DVT (deep venous thrombosis)   . Allergic rhinitis   . Hyperlipidemia   . Factor V deficiency   . Colon polyps   . Osteoarthritis    History reviewed. No pertinent past surgical history. Family History  Problem Relation Age of Onset  . Factor V Leiden deficiency    . Cancer Father    History  Substance Use Topics  . Smoking status: Current Every Day Smoker  . Smokeless tobacco: Not on file  . Alcohol Use: Yes    Review of Systems  Constitutional: Negative.   Gastrointestinal: Negative for nausea.  Musculoskeletal: Negative.   Skin: Positive for wound.  Neurological: Negative for light-headedness.  Hematological: Bruises/bleeds easily.    Allergies  Yellow jacket venom and Cephalexin  Home Medications   Current Outpatient Rx  Name  Route  Sig  Dispense  Refill  . benazepril (LOTENSIN) 20 MG tablet      TAKE ONE TABLET BY MOUTH EVERY DAY   90 tablet   0   . buPROPion (WELLBUTRIN XL) 300 MG 24 hr tablet   Oral   Take 300 mg by mouth daily.         . Loratadine 10 MG CAPS   Oral   Take 1 capsule by mouth daily.          .  meloxicam (MOBIC) 15 MG tablet      TAKE ONE TABLET BY MOUTH EVERY DAY   90 tablet   3   . mirtazapine (REMERON SOL-TAB) 15 MG disintegrating tablet   Oral   Take 1 tablet (15 mg total) by mouth at bedtime.   30 tablet   3   . rosuvastatin (CRESTOR) 20 MG tablet   Oral   Take 1 tablet (20 mg total) by mouth daily.   30 tablet   11   . XARELTO 20 MG TABS tablet      TAKE ONE TABLET BY MOUTH EVERY DAY   30 tablet   6    BP 154/92  Pulse 90  Temp(Src) 98.3 F (36.8 C) (Oral)  Resp 16  Ht 6\' 5"  (1.956 m)  Wt 240 lb (108.863 kg)  BMI 28.45 kg/m2  SpO2 99% Physical Exam  Nursing note and vitals reviewed. Constitutional: He is oriented to person, place, and time. He appears well-developed and well-nourished. No distress.  HENT:  Head: Normocephalic and atraumatic.  Mouth/Throat: Oropharynx is clear and moist.  Eyes: Conjunctivae are normal.  Neck: Normal range of motion. Neck supple.  Cardiovascular: Normal rate, regular rhythm and normal heart sounds.   Pulmonary/Chest: Effort normal and breath sounds normal.  Musculoskeletal:  Full ROM of right index finger. Full flexion and extension. No swelling, tenderness or deformity. Cap refill < 3 seconds.  Neurological: He is alert and oriented to person, place, and time.  Skin: Skin is warm and dry. He is not diaphoretic.  Nail partially avulsed from right index finger. Actively bleeding.  Psychiatric: He has a normal mood and affect. His behavior is normal.    ED Course  NAIL REMOVAL Date/Time: 11/18/2013 4:09 PM Performed by: Illene Labrador Authorized by: Illene Labrador Consent: Verbal consent obtained. Risks and benefits: risks, benefits and alternatives were discussed Consent given by: patient Patient understanding: patient states understanding of the procedure being performed Patient identity confirmed: verbally with patient and arm band Location: right hand Location details: right index finger Anesthesia:  digital block Local anesthetic: lidocaine 1% without epinephrine Patient sedated: no Preparation: skin prepped with Betadine and skin prepped with alcohol Amount removed: complete Wedge excision of skin of nail fold: no Nail bed sutured: yes Suture material: 5-0 Chromic gut Number of sutures: 3 Removed nail replaced and anchored: yes (5-0 vicryl, 4 sutures) Dressing: gauze roll Patient tolerance: Patient tolerated the procedure well with no immediate complications.   (including critical care time)   Labs Review Labs Reviewed - No data to display Imaging Review No results found.  EKG Interpretation   None       MDM   1. Nail avulsion, finger   2. Nailbed laceration, finger    Pt with nailbed laceration and nail avulsion. No crush injury, nail was pulled off when he moved his hand. Neurovascularly intact. Full ROM. Wound irrigated thoroughly, nailbed laceration repaired, nail anchored down. Bleeding controlled. UTD on tetanus. Stable for d/c. F/u with PCP. Return precautions given. Patient states understanding of treatment care plan and is agreeable.     Illene Labrador, PA-C 11/18/13 [phone]

## 2013-11-18 NOTE — ED Provider Notes (Signed)
Medical screening examination/treatment/procedure(s) were performed by non-physician practitioner and as supervising physician I was immediately available for consultation/collaboration.  EKG Interpretation   None         Orpah Greek, MD 11/18/13 [phone]

## 2013-11-24 ENCOUNTER — Encounter: Payer: Self-pay | Admitting: Internal Medicine

## 2013-11-24 ENCOUNTER — Ambulatory Visit (INDEPENDENT_AMBULATORY_CARE_PROVIDER_SITE_OTHER): Payer: Worker's Compensation | Admitting: Internal Medicine

## 2013-11-24 VITALS — BP 130/80 | HR 110 | Temp 98.5°F | Resp 20 | Ht 77.0 in | Wt 250.0 lb

## 2013-11-24 DIAGNOSIS — Z4802 Encounter for removal of sutures: Secondary | ICD-10-CM

## 2013-11-24 DIAGNOSIS — M1990 Unspecified osteoarthritis, unspecified site: Secondary | ICD-10-CM

## 2013-11-24 DIAGNOSIS — S61309A Unspecified open wound of unspecified finger with damage to nail, initial encounter: Secondary | ICD-10-CM

## 2013-11-24 DIAGNOSIS — S61209A Unspecified open wound of unspecified finger without damage to nail, initial encounter: Secondary | ICD-10-CM

## 2013-11-24 NOTE — Patient Instructions (Signed)
Keep area clean and dry  Call or return to clinic prn if these symptoms worsen or fail to improve as anticipated.  Smoking tobacco is very bad for your health. You should stop smoking immediately.

## 2013-11-24 NOTE — Progress Notes (Signed)
Pre-visit discussion using our clinic review tool. No additional management support is needed unless otherwise documented below in the visit note.  

## 2013-11-24 NOTE — Progress Notes (Signed)
   Subjective:    Patient ID: Rodney Scott, male    DOB: 1951-05-04, 63 y.o.   MRN: [id number]  HPI  63 year old patient who is seen today for suture removal. 5 days ago he was seen in the ED for an avulsion involving the fingernail of the right index finger. Subcutaneous sutures were placed as well as external sutures to fix the nail in place. He has a history of dyslipidemia and prior history of pulmonary embolism. He continues to smoke.  Past Medical History  Diagnosis Date  . DVT (deep venous thrombosis)   . Allergic rhinitis   . Hyperlipidemia   . Factor V deficiency   . Colon polyps   . Osteoarthritis     History   Social History  . Marital Status: Married    Spouse Name: N/A    Number of Children: N/A  . Years of Education: N/A   Occupational History  . Not on file.   Social History Main Topics  . Smoking status: Current Every Day Smoker  . Smokeless tobacco: Not on file  . Alcohol Use: Yes  . Drug Use: No  . Sexual Activity: Yes   Other Topics Concern  . Not on file   Social History Narrative  . No narrative on file    History reviewed. No pertinent past surgical history.  Family History  Problem Relation Age of Onset  . Factor V Leiden deficiency    . Cancer Father     Allergies  Allergen Reactions  . Yellow Jacket Venom [Bee Venom] Shortness Of Breath    Had swelling localized, chest tightness, and shortness of breath  . Cephalexin     REACTION: solar rash    Current Outpatient Prescriptions on File Prior to Visit  Medication Sig Dispense Refill  . benazepril (LOTENSIN) 20 MG tablet TAKE ONE TABLET BY MOUTH EVERY DAY  90 tablet  0  . buPROPion (WELLBUTRIN XL) 300 MG 24 hr tablet Take 300 mg by mouth daily.      . Loratadine 10 MG CAPS Take 1 capsule by mouth daily.       . meloxicam (MOBIC) 15 MG tablet TAKE ONE TABLET BY MOUTH EVERY DAY  90 tablet  3  . mirtazapine (REMERON SOL-TAB) 15 MG disintegrating tablet Take 1 tablet (15 mg total)  by mouth at bedtime.  30 tablet  3  . rosuvastatin (CRESTOR) 20 MG tablet Take 1 tablet (20 mg total) by mouth daily.  30 tablet  11  . XARELTO 20 MG TABS tablet TAKE ONE TABLET BY MOUTH EVERY DAY  30 tablet  6   No current facility-administered medications on file prior to visit.    BP 130/80  Pulse 110  Temp(Src) 98.5 F (36.9 C) (Oral)  Resp 20  Ht 6\' 5"  (1.956 m)  Wt 250 lb (113.399 kg)  BMI 29.64 kg/m2  SpO2 98%       Review of Systems  Skin: Positive for wound.       Objective:   Physical Exam  Constitutional: He appears well-developed and well-nourished. No distress.  Blood pressure 130/80  Skin:  4 sutures from the lateral aspect of the right distal finger through  the fingernail were removed without difficulty  Antibiotic ointment was applied and the wound dressed          Assessment & Plan:   Suture removal right distal index finger Status post avulsion fingernail of the right index finger

## 2013-11-25 ENCOUNTER — Telehealth: Payer: Self-pay | Admitting: Internal Medicine

## 2013-11-25 NOTE — Telephone Encounter (Signed)
Relevant patient education mailed to patient.  

## 2013-11-28 ENCOUNTER — Other Ambulatory Visit: Payer: Self-pay | Admitting: Internal Medicine

## 2014-01-02 ENCOUNTER — Other Ambulatory Visit: Payer: Self-pay | Admitting: Internal Medicine

## 2014-01-21 ENCOUNTER — Telehealth: Payer: Self-pay | Admitting: Internal Medicine

## 2014-01-21 ENCOUNTER — Encounter: Payer: Self-pay | Admitting: Internal Medicine

## 2014-01-21 ENCOUNTER — Ambulatory Visit (INDEPENDENT_AMBULATORY_CARE_PROVIDER_SITE_OTHER): Payer: BC Managed Care – PPO | Admitting: Internal Medicine

## 2014-01-21 VITALS — BP 120/88 | HR 80 | Temp 98.7°F | Wt 250.0 lb

## 2014-01-21 DIAGNOSIS — M129 Arthropathy, unspecified: Secondary | ICD-10-CM

## 2014-01-21 DIAGNOSIS — I10 Essential (primary) hypertension: Secondary | ICD-10-CM

## 2014-01-21 DIAGNOSIS — M1990 Unspecified osteoarthritis, unspecified site: Secondary | ICD-10-CM

## 2014-01-21 MED ORDER — CELECOXIB 200 MG PO CAPS: 200.0000 mg | ORAL_CAPSULE | Freq: Two times a day (BID) | ORAL | Status: DC

## 2014-01-21 NOTE — Telephone Encounter (Signed)
Relevant patient education mailed to patient.  

## 2014-01-21 NOTE — Patient Instructions (Signed)
The patient is instructed to continue all medications as prescribed. Schedule followup with check out clerk upon leaving the clinic  

## 2014-01-21 NOTE — Progress Notes (Signed)
Subjective:    Patient ID: Rodney Scott, male    DOB: [birth year], 63 y.o.   MRN: [id number]  HPI Stable follow up for HTN and lipids Has changed the timing on medications and this has help daytime somnolence  The meloxicam has not been effect for the hip pain Some daytime sleepiness and has not been exercising weight gain noted BMI is 30!!!!!!! Needs to loose weight and exercise   Review of Systems  Constitutional: Positive for fatigue. Negative for fever.  HENT: Negative for congestion, hearing loss and postnasal drip.   Eyes: Negative for discharge, redness and visual disturbance.  Respiratory: Negative for cough, shortness of breath and wheezing.   Cardiovascular: Negative for leg swelling.  Gastrointestinal: Negative for abdominal pain, constipation and abdominal distention.  Genitourinary: Negative for urgency and frequency.  Musculoskeletal: Negative for arthralgias, joint swelling and neck pain.  Skin: Negative for color change and rash.  Neurological: Negative for weakness and light-headedness.  Hematological: Negative for adenopathy.  Psychiatric/Behavioral: Negative for behavioral problems.       Past Medical History  Diagnosis Date  . DVT (deep venous thrombosis)   . Allergic rhinitis   . Hyperlipidemia   . Factor V deficiency   . Colon polyps   . Osteoarthritis     History   Social History  . Marital Status: Married    Spouse Name: N/A    Number of Children: N/A  . Years of Education: N/A   Occupational History  . Not on file.   Social History Main Topics  . Smoking status: Current Every Day Smoker  . Smokeless tobacco: Not on file  . Alcohol Use: Yes  . Drug Use: No  . Sexual Activity: Yes   Other Topics Concern  . Not on file   Social History Narrative  . No narrative on file    No past surgical history on file.  Family History  Problem Relation Age of Onset  . Factor V Leiden deficiency    . Cancer Father     Allergies    Allergen Reactions  . Yellow Jacket Venom [Bee Venom] Shortness Of Breath    Had swelling localized, chest tightness, and shortness of breath  . Cephalexin     REACTION: solar rash    Current Outpatient Prescriptions on File Prior to Visit  Medication Sig Dispense Refill  . benazepril (LOTENSIN) 20 MG tablet TAKE ONE TABLET BY MOUTH ONCE DAILY  90 tablet  3  . buPROPion (WELLBUTRIN XL) 300 MG 24 hr tablet Take 300 mg by mouth daily.      . CRESTOR 20 MG tablet TAKE ONE TABLET BY MOUTH ONCE DAILY  30 tablet  0  . Loratadine 10 MG CAPS Take 1 capsule by mouth daily.       . meloxicam (MOBIC) 15 MG tablet TAKE ONE TABLET BY MOUTH EVERY DAY  90 tablet  3  . mirtazapine (REMERON SOL-TAB) 15 MG disintegrating tablet Take 1 tablet (15 mg total) by mouth at bedtime.  30 tablet  3  . XARELTO 20 MG TABS tablet TAKE ONE TABLET BY MOUTH EVERY DAY  30 tablet  6   No current facility-administered medications on file prior to visit.    BP 120/88  Pulse 80  Temp(Src) 98.7 F (37.1 C) (Oral)  Wt 250 lb (113.399 kg)    Objective:   Physical Exam  Nursing note and vitals reviewed. Constitutional: He appears well-developed and well-nourished.  HENT:  Head: Normocephalic and  atraumatic.  Eyes: Conjunctivae are normal. Pupils are equal, round, and reactive to light.  Neck: Normal range of motion. Neck supple.  Cardiovascular: Normal rate and regular rhythm.   Pulmonary/Chest: Effort normal and breath sounds normal.  Abdominal: Soft. Bowel sounds are normal.  Skin: Skin is warm and dry.    Joints swollen      Assessment & Plan:  Weight gain noted and diet discussed  I have spent more than 30 minutes examining this patient face-to-face of which over half was spent in counseling  HTN stable  PSA stable  celebrex 200 for arthritis

## 2014-01-21 NOTE — Progress Notes (Signed)
Pre visit review using our clinic review tool, if applicable. No additional management support is needed unless otherwise documented below in the visit note. 

## 2014-01-25 ENCOUNTER — Other Ambulatory Visit: Payer: Self-pay | Admitting: Internal Medicine

## 2014-02-28 ENCOUNTER — Other Ambulatory Visit: Payer: Self-pay | Admitting: Internal Medicine

## 2014-03-07 ENCOUNTER — Other Ambulatory Visit: Payer: Self-pay | Admitting: Internal Medicine

## 2014-04-12 ENCOUNTER — Other Ambulatory Visit: Payer: Self-pay | Admitting: Internal Medicine

## 2014-05-01 ENCOUNTER — Other Ambulatory Visit: Payer: Self-pay | Admitting: Internal Medicine

## 2014-05-13 ENCOUNTER — Other Ambulatory Visit: Payer: Self-pay | Admitting: Internal Medicine

## 2014-06-27 ENCOUNTER — Telehealth: Payer: Self-pay | Admitting: Internal Medicine

## 2014-06-27 NOTE — Telephone Encounter (Signed)
Pt was a [facility] pt . Pt will need refill on buPROPion (WELLBUTRIN XL) 300 MG 24 hr tablet And   XARELTO 20 MG TABS by end of October. Pt has appt on 11/04/14.  Pt  Concerned he may need to be seen especially for the [place].  Is it ok to wait until Jan, or does pt need to scheduled a FU on his meds before then?

## 2014-06-27 NOTE — Telephone Encounter (Signed)
Please work him in if he would like

## 2014-06-27 NOTE — Telephone Encounter (Signed)
Its fine to work him in to discuss medications if he would like.

## 2014-06-27 NOTE — Telephone Encounter (Signed)
Dr. Hunter please advise. 

## 2014-06-30 NOTE — Telephone Encounter (Signed)
He would like/done

## 2014-07-26 ENCOUNTER — Ambulatory Visit (INDEPENDENT_AMBULATORY_CARE_PROVIDER_SITE_OTHER): Payer: BC Managed Care – PPO | Admitting: Family Medicine

## 2014-07-26 ENCOUNTER — Encounter: Payer: Self-pay | Admitting: Family Medicine

## 2014-07-26 VITALS — BP 130/82 | HR 64 | Temp 98.3°F | Wt 234.0 lb

## 2014-07-26 DIAGNOSIS — I10 Essential (primary) hypertension: Secondary | ICD-10-CM

## 2014-07-26 DIAGNOSIS — F331 Major depressive disorder, recurrent, moderate: Secondary | ICD-10-CM

## 2014-07-26 DIAGNOSIS — E785 Hyperlipidemia, unspecified: Secondary | ICD-10-CM

## 2014-07-26 DIAGNOSIS — Z0000 Encounter for general adult medical examination without abnormal findings: Secondary | ICD-10-CM

## 2014-07-26 DIAGNOSIS — Z125 Encounter for screening for malignant neoplasm of prostate: Secondary | ICD-10-CM

## 2014-07-26 DIAGNOSIS — F17200 Nicotine dependence, unspecified, uncomplicated: Secondary | ICD-10-CM

## 2014-07-26 DIAGNOSIS — Z23 Encounter for immunization: Secondary | ICD-10-CM

## 2014-07-26 DIAGNOSIS — Z87891 Personal history of nicotine dependence: Secondary | ICD-10-CM

## 2014-07-26 DIAGNOSIS — M1990 Unspecified osteoarthritis, unspecified site: Secondary | ICD-10-CM

## 2014-07-26 DIAGNOSIS — IMO0001 Reserved for inherently not codable concepts without codable children: Secondary | ICD-10-CM

## 2014-07-26 DIAGNOSIS — D6851 Activated protein C resistance: Secondary | ICD-10-CM

## 2014-07-26 HISTORY — DX: Nicotine dependence, unspecified, uncomplicated: F17.200

## 2014-07-26 HISTORY — DX: Essential (primary) hypertension: I10

## 2014-07-26 MED ORDER — BENAZEPRIL HCL 20 MG PO TABS: ORAL_TABLET | ORAL | Status: DC

## 2014-07-26 MED ORDER — BUPROPION HCL ER (XL) 300 MG PO TB24: 300.0000 mg | ORAL_TABLET | Freq: Every day | ORAL | Status: DC

## 2014-07-26 MED ORDER — MIRTAZAPINE 15 MG PO TBDP: ORAL_TABLET | ORAL | Status: DC

## 2014-07-26 MED ORDER — RIVAROXABAN 20 MG PO TABS: ORAL_TABLET | ORAL | Status: DC

## 2014-07-26 MED ORDER — ROSUVASTATIN CALCIUM 20 MG PO TABS: ORAL_TABLET | ORAL | Status: DC

## 2014-07-26 MED ORDER — LORATADINE 10 MG PO CAPS: 1.0000 | ORAL_CAPSULE | Freq: Every day | ORAL | Status: DC

## 2014-07-26 MED ORDER — CELECOXIB 200 MG PO CAPS: 200.0000 mg | ORAL_CAPSULE | Freq: Two times a day (BID) | ORAL | Status: DC

## 2014-07-26 NOTE — Assessment & Plan Note (Signed)
Continue benazepril 20mg  as well controlled.

## 2014-07-26 NOTE — Assessment & Plan Note (Signed)
S:Patient states symptoms well controlled ROS-no SI/HI A/P: well controlled on wellbutrin and remeron-continue both at this time. Could consider trial off in future after checking phq9 of at least the remeron.

## 2014-07-26 NOTE — Progress Notes (Signed)
[name], MD Phone: [phone]  Subjective:   Rodney Scott is a 63 y.o. year old very pleasant male patient who presents with the following:  Factor V leiden deficiency/history DVT-stable Patient has done well on xarelto 20mg  with no recurrence of disease ROS- no calf swelling or pain  Hyperlipidemia-mild poor control  Lab Results  Component Value Date   LDLCALC 105* 08/13/2013  On statin: takes 10-20mg  of Crestor ROS- no chest pain or shortness of breath. No myalgias  Hypertension-well controlled on benazepril  BP Readings from Last 3 Encounters:  07/26/14 130/82  01/21/14 120/88  11/24/13 130/80  Compliant with medications-yes without side effects ROS-Denies any CP, HA, SOB, blurry vision.   Past Medical History- Patient Active Problem List   Diagnosis Date Noted  . Former smoker 07/26/2014    Priority: High  . Factor V Leiden 07/31/2007    Priority: High  . DVT, HX OF 07/31/2007    Priority: High  . Essential hypertension, benign 07/26/2014    Priority: Medium  . DEPRESSION, MAJOR, RECURRENT, MODERATE 03/24/2009    Priority: Medium  . Hyperlipidemia 07/31/2007    Priority: Medium  . Right rotator cuff tear 12/07/2010    Priority: Low  . Internal hemorrhoid 11/24/2009    Priority: Low  . 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 Osteoarthritis 07/15/2008    Priority: Low  . VENOUS INSUFFICIENCY, LEGS 12/11/2007    Priority: Low  . Allergic rhinitis 07/31/2007    Priority: Low  . COLONIC POLYPS, HX OF 07/31/2007    Priority: Low   Medications- reviewed and updated Current Outpatient Prescriptions  Medication Sig Dispense Refill  . benazepril (LOTENSIN) 20 MG tablet TAKE ONE TABLET BY MOUTH ONCE DAILY  90 tablet  3  . buPROPion (WELLBUTRIN XL) 300 MG 24 hr tablet Take 1 tablet (300 mg total) by mouth daily.  90 tablet  3  . celecoxib (CELEBREX) 200 MG capsule Take 1 capsule (200 mg total) by mouth 2 (two) times daily.   180 capsule  3  . Loratadine 10 MG CAPS Take 1 capsule (10 mg total) by mouth daily.  90 each  3  . mirtazapine (REMERON SOL-TAB) 15 MG disintegrating tablet TAKE ONE  BY MOUTH ONCE DAILY AT BEDTIME  90 tablet  3  . rivaroxaban (XARELTO) 20 MG TABS tablet TAKE ONE TABLET BY MOUTH ONCE DAILY  90 tablet  3  . rosuvastatin (CRESTOR) 20 MG tablet TAKE ONE TABLET BY MOUTH ONCE DAILY  90 tablet  3   No current facility-administered medications for this visit.    Objective: BP 130/82  Pulse 64  Temp(Src) 98.3 F (36.8 C)  Wt 234 lb (106.142 kg) Gen: NAD, resting comfortably in chair CV: RRR no murmurs rubs or gallops Lungs: CTAB no crackles, wheeze, rhonchi Abdomen: soft/nontender/nondistended/normal bowel sounds.  Ext: no edema Skin: warm, dry, no rash Neuro: grossly normal, moves all extremities   Assessment/Plan:  Factor V Leiden Well controlled. Continue xarelto.   Hyperlipidemia Last cholesterol check 1 year ago-recheck at this time. Continue crestor 10mg  for now.   Essential hypertension, benign Continue benazepril 20mg  as well controlled.   DEPRESSION, MAJOR, RECURRENT, MODERATE S:Patient states symptoms well controlled ROS-no SI/HI A/P: well controlled on wellbutrin and remeron-continue both at this time. Could consider trial off in future after checking phq9 of at least the remeron.   has follow up in January  Future fasting labs Orders Placed This Encounter  Procedures  .  CBC    [place]    Standing Status: Future     Number of Occurrences:      Standing Expiration Date: 11/13/2014  . Comprehensive metabolic panel    [place]    Standing Status: Future     Number of Occurrences:      Standing Expiration Date: 11/13/2014  . Lipid panel    [place]    Standing Status: Future     Number of Occurrences:      Standing Expiration Date: 11/13/2014  . PSA    Standing Status: Future     Number of Occurrences:      Standing Expiration Date: 11/13/2014  . TSH     [place]    Standing Status: Future     Number of Occurrences:      Standing Expiration Date: 11/13/2014    Meds ordered this encounter  Medications  . celecoxib (CELEBREX) 200 MG capsule    Sig: Take 1 capsule (200 mg total) by mouth 2 (two) times daily.    Dispense:  180 capsule    Refill:  3  . benazepril (LOTENSIN) 20 MG tablet    Sig: TAKE ONE TABLET BY MOUTH ONCE DAILY    Dispense:  90 tablet    Refill:  3  . buPROPion (WELLBUTRIN XL) 300 MG 24 hr tablet    Sig: Take 1 tablet (300 mg total) by mouth daily.    Dispense:  90 tablet    Refill:  3  . rosuvastatin (CRESTOR) 20 MG tablet    Sig: TAKE ONE TABLET BY MOUTH ONCE DAILY    Dispense:  90 tablet    Refill:  3  . Loratadine 10 MG CAPS    Sig: Take 1 capsule (10 mg total) by mouth daily.    Dispense:  90 each    Refill:  3  . mirtazapine (REMERON SOL-TAB) 15 MG disintegrating tablet    Sig: TAKE ONE  BY MOUTH ONCE DAILY AT BEDTIME    Dispense:  90 tablet    Refill:  3  . rivaroxaban (XARELTO) 20 MG TABS tablet    Sig: TAKE ONE TABLET BY MOUTH ONCE DAILY    Dispense:  90 tablet    Refill:  3

## 2014-07-26 NOTE — Assessment & Plan Note (Signed)
Last cholesterol check 1 year ago-recheck at this time. Continue crestor 10mg  for now.

## 2014-07-26 NOTE — Patient Instructions (Addendum)
Health Maintenance Due  Topic Date Due  . Zostavax/shingles-call your insurance 01/03/2012  . Colonoscopy - call GI for repeat  03/24/2014   Refilled all medicines.   See me back in January.   Get fasting labs at your convenience sometime in the next month.   We can do your rectal exam in January as well.

## 2014-07-26 NOTE — Assessment & Plan Note (Signed)
Well controlled. Continue xarelto.

## 2014-10-28 ENCOUNTER — Other Ambulatory Visit (INDEPENDENT_AMBULATORY_CARE_PROVIDER_SITE_OTHER): Payer: BLUE CROSS/BLUE SHIELD

## 2014-10-28 DIAGNOSIS — Z0000 Encounter for general adult medical examination without abnormal findings: Secondary | ICD-10-CM

## 2014-10-28 DIAGNOSIS — IMO0001 Reserved for inherently not codable concepts without codable children: Secondary | ICD-10-CM

## 2014-10-28 DIAGNOSIS — Z125 Encounter for screening for malignant neoplasm of prostate: Secondary | ICD-10-CM

## 2014-10-28 DIAGNOSIS — E785 Hyperlipidemia, unspecified: Secondary | ICD-10-CM

## 2014-10-28 DIAGNOSIS — I10 Essential (primary) hypertension: Secondary | ICD-10-CM

## 2014-10-28 LAB — COMPREHENSIVE METABOLIC PANEL
ALK PHOS: 52 U/L (ref 39–117)
ALT: 34 U/L (ref 0–53)
AST: 21 U/L (ref 0–37)
Albumin: 3.7 g/dL (ref 3.5–5.2)
BUN: 17 mg/dL (ref 6–23)
CHLORIDE: 106 meq/L (ref 96–112)
CO2: 30 mEq/L (ref 19–32)
CREATININE: 1.02 mg/dL (ref 0.40–1.50)
Calcium: 9.2 mg/dL (ref 8.4–10.5)
GFR: 78.45 mL/min (ref >60.00)
GLUCOSE: 98 mg/dL (ref 70–99)
Potassium: 4.8 mEq/L (ref 3.5–5.1)
SODIUM: 140 meq/L (ref 135–145)
TOTAL PROTEIN: 6.2 g/dL (ref 6.0–8.3)
Total Bilirubin: 0.5 mg/dL (ref 0.2–1.2)

## 2014-10-28 LAB — LIPID PANEL
Cholesterol: 205 mg/dL — ABNORMAL HIGH (ref 0–200)
HDL: 45 mg/dL (ref >39.00)
NONHDL: 160
Total CHOL/HDL Ratio: 5
Triglycerides: 203 mg/dL — ABNORMAL HIGH (ref 0.0–149.0)
VLDL: 40.6 mg/dL — ABNORMAL HIGH (ref 0.0–40.0)

## 2014-10-28 LAB — CBC
HEMATOCRIT: 47.8 % (ref 39.0–52.0)
Hemoglobin: 15.9 g/dL (ref 13.0–17.0)
MCHC: 33.3 g/dL (ref 30.0–36.0)
MCV: 96.8 fl (ref 78.0–100.0)
Platelets: 194 10*3/uL (ref 150.0–400.0)
RBC: 4.94 Mil/uL (ref 4.22–5.81)
RDW: 13.4 % (ref 11.5–15.5)
WBC: 6.9 10*3/uL (ref 4.0–10.5)

## 2014-10-28 LAB — LDL CHOLESTEROL, DIRECT: LDL DIRECT: 125 mg/dL

## 2014-10-28 LAB — TSH: TSH: 1.08 u[IU]/mL (ref 0.35–4.50)

## 2014-10-28 LAB — PSA: PSA: 0.44 ng/mL (ref 0.10–4.00)

## 2014-11-04 ENCOUNTER — Ambulatory Visit: Payer: Self-pay | Admitting: Family Medicine

## 2014-11-08 NOTE — Addendum Note (Signed)
Addended by: Clyde Lundborg A on: 11/08/2014 08:22 AM   Modules accepted: Orders

## 2014-11-17 ENCOUNTER — Telehealth: Payer: Self-pay | Admitting: Family Medicine

## 2014-11-17 NOTE — Telephone Encounter (Signed)
Please advise 

## 2014-11-17 NOTE — Telephone Encounter (Signed)
Pt had an establishment for 11/04/14 we closed because of snow. He still need his visit can I schedule him the week of 12/05/14 and create a 30 minute appt  .

## 2014-11-17 NOTE — Telephone Encounter (Signed)
How about march 1st? Looks like I only have 2 30 minute slots that day so far.

## 2014-12-13 ENCOUNTER — Encounter: Payer: Self-pay | Admitting: Family Medicine

## 2014-12-13 ENCOUNTER — Ambulatory Visit (INDEPENDENT_AMBULATORY_CARE_PROVIDER_SITE_OTHER): Payer: BLUE CROSS/BLUE SHIELD | Admitting: Family Medicine

## 2014-12-13 VITALS — BP 110/80 | Temp 98.2°F | Wt 249.0 lb

## 2014-12-13 DIAGNOSIS — Z7251 High risk heterosexual behavior: Secondary | ICD-10-CM

## 2014-12-13 DIAGNOSIS — Z860100 Personal history of colon polyps, unspecified: Secondary | ICD-10-CM

## 2014-12-13 DIAGNOSIS — I10 Essential (primary) hypertension: Secondary | ICD-10-CM

## 2014-12-13 DIAGNOSIS — D6851 Activated protein C resistance: Secondary | ICD-10-CM

## 2014-12-13 DIAGNOSIS — L570 Actinic keratosis: Secondary | ICD-10-CM

## 2014-12-13 DIAGNOSIS — E785 Hyperlipidemia, unspecified: Secondary | ICD-10-CM

## 2014-12-13 DIAGNOSIS — Z87891 Personal history of nicotine dependence: Secondary | ICD-10-CM

## 2014-12-13 LAB — POCT URINALYSIS DIPSTICK
Bilirubin, UA: NEGATIVE
Glucose, UA: NEGATIVE
Ketones, UA: NEGATIVE
Leukocytes, UA: NEGATIVE
NITRITE UA: NEGATIVE
PH UA: 7
Protein, UA: NEGATIVE
RBC UA: NEGATIVE
SPEC GRAV UA: 1.015
Urobilinogen, UA: 0.2

## 2014-12-13 LAB — LDL CHOLESTEROL, DIRECT: LDL DIRECT: 90 mg/dL

## 2014-12-13 NOTE — Assessment & Plan Note (Signed)
Crestor 20mg  daily. LDL 90 in 11/2014 with recent increase from 10mg .

## 2014-12-13 NOTE — Patient Instructions (Addendum)
Actinic keratosis is a precancer. We froze one off your R arm today. You can use vaseline if it blisters up and a bandaid.   Recheck cholesterol on higher dose  Check urine  Check for HIV based on national recommendations  Referred for colonoscopy. Call us if havent heard by late last week.  Check in 6 months from today

## 2014-12-13 NOTE — Progress Notes (Signed)
Rodney Reddish, MD Phone: [phone]  Subjective:  Patient presents today to establish care with me as their new primary care provider. Patient was formerly a patient of Dr. Arnoldo Morale. Chief complaint-noted.   Hyperlipidemia-increased to 20mg  Crestor last visit  Regular exercise: active with work, does not want to do regular exercise despite being advised; ROS- no chest pain or shortness of breath. No myalgias  Hypertension-controlled  BP Readings from Last 3 Encounters:  12/13/14 110/80  07/26/14 130/82  01/21/14 120/88   Home BP monitoring-no Compliant with medications-yes without side effects ROS-Denies any CP, HA, SOB, blurry vision.   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 COmpliant with xarelto.  ROS- no calf pain, no chest pain or shortness of breath as above  The following were reviewed and entered/updated in epic: Past Medical History  Diagnosis Date  . DVT (deep venous thrombosis)   . Allergic rhinitis   . Hyperlipidemia   . Factor V deficiency   . Colon polyps   . Osteoarthritis   . Internal hemorrhoid 11/24/2009    No bleeding. Patient states thought was external.     . Right rotator cuff tear 12/07/2010    S/p surgery.     Patient Active Problem List   Diagnosis Date Noted  . Former smoker 07/26/2014    Priority: High  . Factor V Leiden 07/31/2007    Priority: High  . DVT, HX OF 07/31/2007    Priority: High  . Essential hypertension, benign 07/26/2014    Priority: Medium  . DEPRESSION, MAJOR, RECURRENT, MODERATE 03/24/2009    Priority: Medium  . Hyperlipidemia 07/31/2007    Priority: Medium  . 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 Osteoarthritis 07/15/2008    Priority: Low  . Venous (peripheral) insufficiency 12/11/2007    Priority: Low  . Allergic rhinitis 07/31/2007    Priority: Low  . History of colonic polyps 07/31/2007    Priority: Low   Past Surgical History  Procedure Laterality Date  . Right  rotator cuff repair    . Salivary stone removal      Family History  Problem Relation Age of Onset  . Factor V Leiden deficiency Son   . Colon cancer Father     Medications- reviewed and updated Current Outpatient Prescriptions  Medication Sig Dispense Refill  . benazepril (LOTENSIN) 20 MG tablet TAKE ONE TABLET BY MOUTH ONCE DAILY 90 tablet 3  . buPROPion (WELLBUTRIN XL) 300 MG 24 hr tablet Take 1 tablet (300 mg total) by mouth daily. 90 tablet 3  . celecoxib (CELEBREX) 200 MG capsule Take 1 capsule (200 mg total) by mouth 2 (two) times daily. 180 capsule 3  . Loratadine 10 MG CAPS Take 1 capsule (10 mg total) by mouth daily. 90 each 3  . mirtazapine (REMERON SOL-TAB) 15 MG disintegrating tablet TAKE ONE  BY MOUTH ONCE DAILY AT BEDTIME 90 tablet 3  . rivaroxaban (XARELTO) 20 MG TABS tablet TAKE ONE TABLET BY MOUTH ONCE DAILY 90 tablet 3  . rosuvastatin (CRESTOR) 20 MG tablet TAKE ONE TABLET BY MOUTH ONCE DAILY 90 tablet 3   Allergies-reviewed and updated Allergies  Allergen Reactions  . Yellow Jacket Venom [Bee Venom] Shortness Of Breath    Had swelling localized, chest tightness, and shortness of breath  . Cephalexin     REACTION: solar rash    History   Social History  . Marital Status: Married    Spouse Name: N/A  . Number of  Children: N/A  . Years of Education: N/A   Social History Main Topics  . Smoking status: Former Smoker    Quit date: 11/06/2014  . Smokeless tobacco: Not on file  . Alcohol Use: 3.6 - 4.8 oz/week    6-8 Standard drinks or equivalent per week  . Drug Use: No  . Sexual Activity: Yes   Other Topics Concern  . None   Social History Narrative   Married (wife patient of Dr. Yong Channel). Son and daughter. 1 granddaughter.       Works for [employer]: time around house, time with family, yardwork, some cabinetryh    ROS--See HPI   Objective: BP 110/80 mmHg  Temp(Src) 98.2 F (36.8 C)  Wt 249 lb (112.946  kg) Gen: NAD, resting comfortably CV: RRR no murmurs rubs or gallops Lungs: CTAB no crackles, wheeze, rhonchi Abdomen: soft/nontender/nondistended/normal bowel sounds. Overweight.  Ext: 1+ edema under L compression stocking, trace edema  Skin: warm, dry, no rash except for scaly erythematous lesion on R forearm 52mm x 85mm  Assessment/Plan:  Hyperlipidemia Crestor 20mg  daily. LDL 90 in 11/2014 with recent increase from 10mg .    Essential hypertension, benign Great control. Continue benazepril 20mg .    Factor V Leiden No DVT recurrence on xarelto. Continue 20mg  daily.    actinic keratosis R forearm- cryotherapy performed  Return precautions advised. 6 month planned follow up  Results for orders placed or performed in visit on 12/13/14 (from the past 24 hour(s))  LDL cholesterol, direct     Status: None   Collection Time: 12/13/14 10:11 AM  Result Value Ref Range   Direct LDL 90.0 mg/dL  POCT urinalysis dipstick     Status: None   Collection Time: 12/13/14 11:13 AM  Result Value Ref Range   Color, UA yellow    Clarity, UA clear    Glucose, UA n    Bilirubin, UA n    Ketones, UA n    Spec Grav, UA 1.015    Blood, UA n    pH, UA 7.0    Protein, UA n    Urobilinogen, UA 0.2    Nitrite, UA n    Leukocytes, UA Negative     Needs updated colonoscopy Orders Placed This Encounter  . Ambulatory referral to Gastroenterology    Referral Priority:  Routine    Referral Type:  Consultation    Referral Reason:  Specialty Services Required    Requested Specialty:  Gastroenterology    Number of Visits Requested:  1

## 2014-12-13 NOTE — Assessment & Plan Note (Signed)
No DVT recurrence on xarelto. Continue 20mg  daily.

## 2014-12-13 NOTE — Assessment & Plan Note (Signed)
Great control. Continue benazepril 20mg .

## 2014-12-14 ENCOUNTER — Encounter: Payer: Self-pay | Admitting: Gastroenterology

## 2014-12-14 LAB — HIV ANTIBODY (ROUTINE TESTING W REFLEX): HIV 1&2 Ab, 4th Generation: NONREACTIVE

## 2015-01-16 ENCOUNTER — Encounter: Payer: Self-pay | Admitting: Gastroenterology

## 2015-01-30 ENCOUNTER — Telehealth: Payer: Self-pay | Admitting: *Deleted

## 2015-01-30 ENCOUNTER — Encounter: Payer: Self-pay | Admitting: Physician Assistant

## 2015-01-30 ENCOUNTER — Ambulatory Visit (INDEPENDENT_AMBULATORY_CARE_PROVIDER_SITE_OTHER): Payer: BC Managed Care – PPO | Admitting: Physician Assistant

## 2015-01-30 VITALS — BP 150/92 | HR 72 | Ht 75.5 in | Wt 239.0 lb

## 2015-01-30 DIAGNOSIS — Z86010 Personal history of colonic polyps: Secondary | ICD-10-CM

## 2015-01-30 DIAGNOSIS — Z800 Family history of malignant neoplasm of digestive organs: Secondary | ICD-10-CM

## 2015-01-30 DIAGNOSIS — Z860101 Personal history of adenomatous and serrated colon polyps: Secondary | ICD-10-CM

## 2015-01-30 MED ORDER — NA SULFATE-K SULFATE-MG SULF 17.5-3.13-1.6 GM/177ML PO SOLN: ORAL | Status: DC

## 2015-01-30 NOTE — Progress Notes (Signed)
Reviewed and agree with management plan.  Jeroline Wolbert T. Cindie Rajagopalan, MD FACG 

## 2015-01-30 NOTE — Telephone Encounter (Signed)
Ok to hold xarelto 24 hours before procedure as it appears benefits/risk were discussed at time of visit. Patient with history DVT and PE.   -[name]

## 2015-01-30 NOTE — Progress Notes (Signed)
Patient ID: Rodney Scott, male   DOB: 1951/09/25, 64 y.o.   MRN: [id number]   Subjective:    Patient ID: Rodney Scott, male    DOB: Nov 28, 1950, 64 y.o.   MRN: [id number]  HPI Rodney Scott is a pleasant 64 year old white male known to Dr. Fuller Plan from prior colonoscopy. He last had: In May 2008 and had several polyps at that time 2 were tubular adenomas and 1 was hyperplastic. She should also has family history of colon cancer in his father. He was advised to him back in 5 years for follow-up colonoscopy. Patient has history of factor V Leiden  deficiency and history of recurrent DVTs and pulmonary emboli. Last pulmonary embolism was in 2011. He is now on Xarelto. He has no current complaints of abdominal pain and changes in bowel habits melena or hematochezia. He says he was reluctant to come back for follow-up because the anesthesia that he had last time "knocked him down" for about 3 days.  Review of Systems Pertinent positive and negative review of systems were noted in the above HPI section.  All other review of systems was otherwise negative.  Outpatient Encounter Prescriptions as of 01/30/2015  Medication Sig  . benazepril (LOTENSIN) 20 MG tablet TAKE ONE TABLET BY MOUTH ONCE DAILY  . buPROPion (WELLBUTRIN XL) 300 MG 24 hr tablet Take 1 tablet (300 mg total) by mouth daily.  . celecoxib (CELEBREX) 200 MG capsule Take 1 capsule (200 mg total) by mouth 2 (two) times daily.  . Loratadine 10 MG CAPS Take 1 capsule (10 mg total) by mouth daily.  . mirtazapine (REMERON SOL-TAB) 15 MG disintegrating tablet TAKE ONE  BY MOUTH ONCE DAILY AT BEDTIME  . rivaroxaban (XARELTO) 20 MG TABS tablet TAKE ONE TABLET BY MOUTH ONCE DAILY  . rosuvastatin (CRESTOR) 20 MG tablet TAKE ONE TABLET BY MOUTH ONCE DAILY  . Na Sulfate-K Sulfate-Mg Sulf SOLN Take as directed for colonoscopy prep   Allergies  Allergen Reactions  . Yellow Jacket Venom [Bee Venom] Shortness Of Breath    Had swelling localized, chest  tightness, and shortness of breath  . Cephalexin     REACTION: solar rash   Patient Active Problem List   Diagnosis Date Noted  . Essential hypertension, benign 07/26/2014  . Former smoker 07/26/2014  . DEPRESSION, MAJOR, RECURRENT, MODERATE 03/24/2009  . 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 Osteoarthritis 07/15/2008  . Venous (peripheral) insufficiency 12/11/2007  . Hyperlipidemia 07/31/2007  . Factor V Leiden 07/31/2007  . Allergic rhinitis 07/31/2007  . DVT, HX OF 07/31/2007  . History of colonic polyps 07/31/2007   History   Social History  . Marital Status: Married    Spouse Name: N/A  . Number of Children: N/A  . Years of Education: N/A   Occupational History  . Not on file.   Social History Main Topics  . Smoking status: Former Smoker    Quit date: 11/06/2014  . Smokeless tobacco: Not on file  . Alcohol Use: 3.6 - 4.8 oz/week    6-8 Standard drinks or equivalent per week  . Drug Use: No  . Sexual Activity: Yes   Other Topics Concern  . Not on file   Social History Narrative   Married (wife patient of Dr. Yong Channel). Son and daughter. 1 granddaughter.       Works for [employer]      Hobbies: time around house, time with family, yardwork, some cabinetryh    [name] family history includes Colon  cancer in his father; Factor V Leiden deficiency in his son.      Objective:    Filed Vitals:   01/30/15 0900  BP: 150/92  Pulse: 72    Physical Exam  well-developed white male in no acute distress, pleasant blood pressure 150/92 pulse 72 height 6 foot 3 weight 239. HEENT; nontraumatic normocephalic EOMI PERRLA sclera anicteric, Supple ;no JVD, Cardiovascular; regular rate and rhythm with S1-S2 no murmur or gallop, Pulmonary; clear bilaterally, Abdomen; soft nontender nondistended bowel sounds are active no palpable mass or hepatosplenomegaly, Rectal; exam not done, Extremities; no clubbing cyanosis or edema skin warm and dry,  Psych; mood and affect appropriate       Assessment & Plan:   #1 64 yo male with hx of adenomatous colon polyps, and family hx of colon cancer in his Father. He is overdue for follow up colonoscopy.Last done 2008 #2 chronic anticoagulation #3 Factor V Leiden Def- with hx of recurrent DVT's and PE's #4 HTN #5 OA  Plan; patient will be scheduled for colonoscopy with Dr. Christen Butter. Procedure was discussed in detail with patient and he is agreeable to proceed. Patient will need to hold Xarelto for 24 hours prior to colonoscopy. We will communicate  with his PCP Dr. Rushie Chestnut to assure that this is reasonable for this patient. Relative risk-benefit of holding anticoagulation also discussed with patient.    Amy Genia Harold PA-C 01/30/2015   Cc: Marin Olp, MD

## 2015-01-30 NOTE — Patient Instructions (Addendum)
You have been scheduled for a colonoscopy. Please follow written instructions given to you at your visit today.  We have given you a sample prep for the colonoscopy. If you use inhalers (even only as needed), please bring them with you on the day of your procedure. Your physician has requested that you go to www.startemmi.com and enter the access code given to you at your visit today. This web site gives a general overview about your procedure. However, you should still follow specific instructions given to you by our office regarding your preparation for the procedure.  We will call you once we hear back from Dr. Yong Channel regarding the Xarelto medication.

## 2015-01-30 NOTE — Telephone Encounter (Signed)
  01/30/2015   RE: Rodney Scott DOB: 01/29/1951 MRN: [id number]   Dear Dr. Yong Channel,    We have scheduled the above patient for an endoscopic procedure. Our records show that he is on anticoagulation therapy.   Please advise as to how long the patient may come off his therapy of Xarelto prior to the procedure, which is scheduled for  02-13-2015.  Please fax back/ or route the completed form to [place] at [phone].   Sincerely,    Amy Esterwood PA-C

## 2015-01-31 NOTE — Telephone Encounter (Signed)
Called patient and advised him to hold the Xarelto on 02-12-2015 and 02-13-2015.  Resume it on 02-14-2015. Patient verbalized understanding instructions.

## 2015-02-09 ENCOUNTER — Encounter: Payer: Self-pay | Admitting: Gastroenterology

## 2015-02-13 ENCOUNTER — Ambulatory Visit (AMBULATORY_SURGERY_CENTER): Payer: BC Managed Care – PPO | Admitting: Gastroenterology

## 2015-02-13 ENCOUNTER — Encounter: Payer: Self-pay | Admitting: Gastroenterology

## 2015-02-13 VITALS — BP 111/67 | HR 67 | Temp 97.9°F | Resp 12 | Ht 75.5 in | Wt 239.0 lb

## 2015-02-13 DIAGNOSIS — D125 Benign neoplasm of sigmoid colon: Secondary | ICD-10-CM

## 2015-02-13 DIAGNOSIS — D124 Benign neoplasm of descending colon: Secondary | ICD-10-CM | POA: Diagnosis not present

## 2015-02-13 DIAGNOSIS — D123 Benign neoplasm of transverse colon: Secondary | ICD-10-CM

## 2015-02-13 DIAGNOSIS — Z86010 Personal history of colonic polyps: Secondary | ICD-10-CM

## 2015-02-13 DIAGNOSIS — Z800 Family history of malignant neoplasm of digestive organs: Secondary | ICD-10-CM | POA: Diagnosis not present

## 2015-02-13 MED ORDER — SODIUM CHLORIDE 0.9 % IV SOLN: 500.0000 mL | INTRAVENOUS | Status: DC

## 2015-02-13 NOTE — Op Note (Signed)
[place]  [address] [place], [address]   COLONOSCOPY PROCEDURE REPORT  PATIENT: Rodney Scott, Rodney Scott  MR#: [id number] BIRTHDATE: [birth year] , 64  yrs. old GENDER: male ENDOSCOPIST: Ladene Artist, MD, [facility] REFERRED BY: [name], MD PROCEDURE DATE:  02/13/2015 PROCEDURE:   Colonoscopy, surveillance , Colonoscopy with biopsy, and Colonoscopy with snare polypectomy First Screening Colonoscopy - Avg.  risk and is 50 yrs.  old or older - No.  Prior Negative Screening - Now for repeat screening. N/A  History of Adenoma - Now for follow-up colonoscopy & has been > or = to 3 yrs.  Yes hx of adenoma.  Has been 3 or more years since last colonoscopy. ASA CLASS:   Class III INDICATIONS:Surveillance due to prior colonic neoplasia, PH Colon Adenoma, and FH Colon or Rectal Adenocarcinoma. MEDICATIONS: Monitored anesthesia care and Propofol 340 mg IV DESCRIPTION OF PROCEDURE:   After the risks benefits and alternatives of the procedure were thoroughly explained, informed consent was obtained.  The digital rectal exam revealed no abnormalities of the rectum.   The [id number]  endoscope was introduced through the anus and advanced to the cecum, which was identified by both the appendix and ileocecal valve. No adverse events experienced.   The quality of the prep was good.  (Suprep was used)  The instrument was then slowly withdrawn as the colon was fully examined.  COLON FINDINGS: Three sessile polyps measuring 5-7 mm in size were found in the descending colon and transverse colon.  Polypectomies were performed with a cold snare.  The resection was complete, the polyp tissue was completely retrieved and sent to histology.   Two sessile polyps measuring 3-4 mm in size were found in the transverse colon.  Polypectomies were performed with cold forceps. The resection was complete, the polyp tissue was completely retrieved and sent to histology.   Four sessile  polyps measuring 6-7 mm in size were found in the sigmoid colon.  Polypectomies were performed with a cold snare.  The resection was complete, the polyp tissue was completely retrieved and sent to histology.  There was mild diverticulosis noted in the sigmoid colon.   The examination was otherwise normal.  Retroflexed views revealed internal Grade I hemorrhoids. The time to cecum = 3.2 Withdrawal time = 21.4   The scope was withdrawn and the procedure completed.  COMPLICATIONS: There were no immediate complications.    ENDOSCOPIC IMPRESSION: 1.   Three sessile polyps in the descending colon and transverse colon; polypectomies performed with a cold snare 2.   Two sessile polyps in the transverse colon; polypectomies performed with cold forceps 3.   Four sessile polyps in the sigmoid colon; polypectomies performed with a cold snare 4.   Mild diverticulosis was noted in the sigmoid colon 5.   Grade I internal hemorrhoids  RECOMMENDATIONS: 1.  Hold Aspirin and all other NSAIDS for 2 weeks. 2.  Await pathology results 3.  Repeat colonoscopy in 3 years if polyp(s) adenomatous; otherwise 5 years 4.  High fiber diet with liberal fluid intake. 5.  Resume Xarelto in 2 days  eSigned:  Ladene Artist, MD, [facility] 02/13/2015 2:34 PM   [C   PATIENT NAME:  Rodney Scott, Rodney Scott MR#: [id number]

## 2015-02-13 NOTE — Patient Instructions (Signed)
YOU HAD AN ENDOSCOPIC PROCEDURE TODAY AT [facility]:   Refer to the procedure report that was given to you for any specific questions about what was found during the examination.  If the procedure report does not answer your questions, please call your gastroenterologist to clarify.  If you requested that your care partner not be given the details of your procedure findings, then the procedure report has been included in a sealed envelope for you to review at your convenience later.  YOU SHOULD EXPECT: Some feelings of bloating in the abdomen. Passage of more gas than usual.  Walking can help get rid of the air that was put into your GI tract during the procedure and reduce the bloating. If you had a lower endoscopy (such as a colonoscopy or flexible sigmoidoscopy) you may notice spotting of blood in your stool or on the toilet paper. If you underwent a bowel prep for your procedure, you may not have a normal bowel movement for a few days.  Please Note:  You might notice some irritation and congestion in your nose or some drainage.  This is from the oxygen used during your procedure.  There is no need for concern and it should clear up in a day or so.  SYMPTOMS TO REPORT IMMEDIATELY:   Following lower endoscopy (colonoscopy or flexible sigmoidoscopy):  Excessive amounts of blood in the stool  Significant tenderness or worsening of abdominal pains  Swelling of the abdomen that is new, acute  Fever of 100F or higher   For urgent or emergent issues, a gastroenterologist can be reached at any hour by calling [phone].   DIET: Your first meal following the procedure should be a small meal and then it is ok to progress to your normal diet. Heavy or fried foods are harder to digest and may make you feel nauseous or bloated.  Likewise, meals heavy in dairy and vegetables can increase bloating.  Drink plenty of fluids but you should avoid alcoholic beverages for 24  hours.  ACTIVITY:  You should plan to take it easy for the rest of today and you should NOT DRIVE or use heavy machinery until tomorrow (because of the sedation medicines used during the test).    FOLLOW UP: Our staff will call the number listed on your records the next business day following your procedure to check on you and address any questions or concerns that you may have regarding the information given to you following your procedure. If we do not reach you, we will leave a message.  However, if you are feeling well and you are not experiencing any problems, there is no need to return our call.  We will assume that you have returned to your regular daily activities without incident.  If any biopsies were taken you will be contacted by phone or by letter within the next 1-3 weeks.  Please call us at [phone] if you have not heard about the biopsies in 3 weeks.    SIGNATURES/CONFIDENTIALITY: You and/or your care partner have signed paperwork which will be entered into your electronic medical record.  These signatures attest to the fact that that the information above on your After Visit Summary has been reviewed and is understood.  Full responsibility of the confidentiality of this discharge information lies with you and/or your care-partner.  Polyps, diverticulosis, high fiber diet, hemorrhoids-handouts given  Hold aspirin, aspirin products, and anti-inflammatory medication (ibuprofen, motrin, advil, aleve, naproxen) for 2 weeks  Resume xarelto in 2 days 02/15/15.

## 2015-02-13 NOTE — Progress Notes (Signed)
Called to room to assist during endoscopic procedure.  Patient ID and intended procedure confirmed with present staff. Received instructions for my participation in the procedure from the performing physician.  

## 2015-02-13 NOTE — Progress Notes (Signed)
TO recovery, report to [place], RN, VSS.

## 2015-02-14 ENCOUNTER — Telehealth: Payer: Self-pay | Admitting: *Deleted

## 2015-02-14 NOTE — Telephone Encounter (Signed)
  Follow up Call-  Call back number 02/13/2015  Post procedure Call Back phone  # [phone]  Permission to leave phone message Yes     Patient questions:  Do you have a fever, pain , or abdominal swelling? No. Pain Score  0 *  Have you tolerated food without any problems? Yes.    Have you been able to return to your normal activities? Yes.    Do you have any questions about your discharge instructions: Diet   No. Medications  No. Follow up visit  No.  Do you have questions or concerns about your Care? No.  Actions: * If pain score is 4 or above: No action needed, pain <4.

## 2015-02-17 ENCOUNTER — Encounter: Payer: Self-pay | Admitting: Gastroenterology

## 2015-06-15 ENCOUNTER — Ambulatory Visit (INDEPENDENT_AMBULATORY_CARE_PROVIDER_SITE_OTHER): Payer: BC Managed Care – PPO | Admitting: Family Medicine

## 2015-06-15 ENCOUNTER — Encounter: Payer: Self-pay | Admitting: Family Medicine

## 2015-06-15 VITALS — BP 132/84 | HR 69 | Temp 98.9°F | Wt 232.0 lb

## 2015-06-15 DIAGNOSIS — D6851 Activated protein C resistance: Secondary | ICD-10-CM | POA: Diagnosis not present

## 2015-06-15 DIAGNOSIS — I10 Essential (primary) hypertension: Secondary | ICD-10-CM | POA: Diagnosis not present

## 2015-06-15 DIAGNOSIS — G3184 Mild cognitive impairment, so stated: Secondary | ICD-10-CM

## 2015-06-15 DIAGNOSIS — Z23 Encounter for immunization: Secondary | ICD-10-CM | POA: Diagnosis not present

## 2015-06-15 DIAGNOSIS — E785 Hyperlipidemia, unspecified: Secondary | ICD-10-CM | POA: Diagnosis not present

## 2015-06-15 DIAGNOSIS — R413 Other amnesia: Secondary | ICD-10-CM

## 2015-06-15 HISTORY — DX: Mild cognitive impairment of uncertain or unknown etiology: G31.84

## 2015-06-15 NOTE — Assessment & Plan Note (Signed)
S: controlled. compliant  BP Readings from Last 3 Encounters:  06/15/15 132/84  02/13/15 111/67  01/30/15 150/92  A/P:Continue current meds:  Benazepril 20mg 

## 2015-06-15 NOTE — Patient Instructions (Addendum)
Medication Instructions:  No changes  Other Instructions:  Goal walking 30 minutes 5 days a week, could bike or do water aerobics or walking  Testing/Procedures/Immunizations: Received flu shot today. Health Maintenance Due  Topic Date Due  . Hepatitis C Screening - next visit 1951-10-05   Follow-Up (all visit scheduling, rescheduling, cancellations including labs should be scheduled at front desk): 6 months for physical with labs a few days before visit  Sooner if you need [place] or if you have new or worsening symptoms

## 2015-06-15 NOTE — Assessment & Plan Note (Signed)
S:3 years, sometimes issues with names.  A/P:Scored 4/5 minicog. Discussed reasonable sensitivity if score not 3 or lower for ruling out dementia but we can follow at least yearly and consider MMSE

## 2015-06-15 NOTE — Progress Notes (Signed)
Rodney Reddish, MD  Subjective:  Rodney Scott is a 64 y.o. year old very pleasant male patient who presents for/with See problem oriented charting ROS- no chest pain or shortness of breath, no edema. Occasional dizziness but mild- not always with standing and no incresing frequency  Past Medical History-  Patient Active Problem List   Diagnosis Date Noted  . Former smoker 07/26/2014    Priority: High  . Factor V Leiden 07/31/2007    Priority: High  . DVT, HX OF 07/31/2007    Priority: High  . Essential hypertension, benign 07/26/2014    Priority: Medium  . DEPRESSION, MAJOR, RECURRENT, MODERATE 03/24/2009    Priority: Medium  . Hyperlipidemia 07/31/2007    Priority: Medium  . 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 Osteoarthritis 07/15/2008    Priority: Low  . Venous (peripheral) insufficiency 12/11/2007    Priority: Low  . Allergic rhinitis 07/31/2007    Priority: Low  . History of colonic polyps 07/31/2007    Priority: Low  . Short-term memory loss 06/15/2015    Medications- reviewed and updated Current Outpatient Prescriptions  Medication Sig Dispense Refill  . benazepril (LOTENSIN) 20 MG tablet TAKE ONE TABLET BY MOUTH ONCE DAILY 90 tablet 3  . buPROPion (WELLBUTRIN XL) 300 MG 24 hr tablet Take 1 tablet (300 mg total) by mouth daily. 90 tablet 3  . celecoxib (CELEBREX) 200 MG capsule Take 1 capsule (200 mg total) by mouth 2 (two) times daily. 180 capsule 3  . Loratadine 10 MG CAPS Take 1 capsule (10 mg total) by mouth daily. 90 each 3  . rivaroxaban (XARELTO) 20 MG TABS tablet TAKE ONE TABLET BY MOUTH ONCE DAILY 90 tablet 3  . rosuvastatin (CRESTOR) 20 MG tablet TAKE ONE TABLET BY MOUTH ONCE DAILY 90 tablet 3  . doxycycline (PERIOSTAT) 20 MG tablet      No current facility-administered medications for this visit.    Objective: BP 132/84 mmHg  Pulse 69  Temp(Src) 98.9 F (37.2 C)  Wt 232 lb (105.235 kg) Gen: NAD, resting  comfortably CV: RRR no murmurs rubs or gallops Lungs: CTAB no crackles, wheeze, rhonchi Ext: no edema Skin: warm, dry, no rash Neuro: grossly normal, moves all extremities  Assessment/Plan:  Former smoker Remains cigarette free for at least 6 months, continue to ask each visit as has relapsed before  Factor V Leiden S:  2 DVT 2001, PE 2011. No recurrence on xarelto, compliant. Does have some easy bleeding.  A/P: continue xarelto. We reviewed bleeding risks on celebrex but given continued issues with neck, patient would like to continue.   Essential hypertension, benign S: controlled. compliant  BP Readings from Last 3 Encounters:  06/15/15 132/84  02/13/15 111/67  01/30/15 150/92  A/P:Continue current meds:  Benazepril 20mg    Hyperlipidemia S: well controlled with crestor 20mg  with last LDL at 90.  A/P:Continue current meds. Patient concerned about meds and memory loss. Discussed crestor likely biggest risk but still think risk is small. He has had memory issues for 3 years and crestor increase was within a year without significant worsening- would remain on for now especially given smoking history and relapses.    Short-term memory loss S:3 years, sometimes issues with names.  A/P:Scored 4/5 minicog. Discussed reasonable sensitivity if score not 3 or lower for ruling out dementia but we can follow at least yearly and consider MMSE  6 months CPE  Orders Placed This Encounter  Procedures  . Flu  Vaccine QUAD 36+ mos IM

## 2015-06-15 NOTE — Assessment & Plan Note (Signed)
Remains cigarette free for at least 6 months, continue to ask each visit as has relapsed before

## 2015-06-15 NOTE — Assessment & Plan Note (Signed)
S:  2 DVT 2001, PE 2011. No recurrence on xarelto, compliant. Does have some easy bleeding.  A/P: continue xarelto. We reviewed bleeding risks on celebrex but given continued issues with neck, patient would like to continue.

## 2015-06-15 NOTE — Assessment & Plan Note (Addendum)
S: well controlled with crestor 20mg  with last LDL at 90.  A/P:Continue current meds. Patient concerned about meds and memory loss. Discussed crestor likely biggest risk but still think risk is small. He has had memory issues for 3 years and crestor increase was within a year without significant worsening- would remain on for now especially given smoking history and relapses.

## 2015-07-29 ENCOUNTER — Other Ambulatory Visit: Payer: Self-pay | Admitting: Family Medicine

## 2015-08-03 ENCOUNTER — Other Ambulatory Visit: Payer: Self-pay | Admitting: Family Medicine

## 2015-08-13 ENCOUNTER — Other Ambulatory Visit: Payer: Self-pay | Admitting: Family Medicine

## 2015-09-02 ENCOUNTER — Other Ambulatory Visit: Payer: Self-pay | Admitting: Family Medicine

## 2015-10-13 ENCOUNTER — Other Ambulatory Visit: Payer: Self-pay | Admitting: Family Medicine

## 2015-11-10 ENCOUNTER — Other Ambulatory Visit: Payer: Self-pay | Admitting: Family Medicine

## 2015-11-10 ENCOUNTER — Other Ambulatory Visit (INDEPENDENT_AMBULATORY_CARE_PROVIDER_SITE_OTHER): Payer: BC Managed Care – PPO

## 2015-11-10 DIAGNOSIS — R7989 Other specified abnormal findings of blood chemistry: Secondary | ICD-10-CM

## 2015-11-10 DIAGNOSIS — E875 Hyperkalemia: Secondary | ICD-10-CM

## 2015-11-10 DIAGNOSIS — Z0000 Encounter for general adult medical examination without abnormal findings: Secondary | ICD-10-CM | POA: Diagnosis not present

## 2015-11-10 LAB — BASIC METABOLIC PANEL
BUN: 21 mg/dL (ref 6–23)
BUN: 25 mg/dL — AB (ref 6–23)
CALCIUM: 9.6 mg/dL (ref 8.4–10.5)
CALCIUM: 9.2 mg/dL (ref 8.4–10.5)
CHLORIDE: 104 meq/L (ref 96–112)
CHLORIDE: 104 meq/L (ref 96–112)
CO2: 32 meq/L (ref 19–32)
CO2: 30 mEq/L (ref 19–32)
CREATININE: 1.08 mg/dL (ref 0.40–1.50)
CREATININE: 1.1 mg/dL (ref 0.40–1.50)
GFR: 73.2 mL/min (ref >60.00)
GFR: 71.66 mL/min (ref >60.00)
GLUCOSE: 106 mg/dL — AB (ref 70–99)
Glucose, Bld: 96 mg/dL (ref 70–99)
Potassium: 5.7 mEq/L — ABNORMAL HIGH (ref 3.5–5.1)
Potassium: 5.1 mEq/L (ref 3.5–5.1)
Sodium: 143 mEq/L (ref 135–145)
Sodium: 141 mEq/L (ref 135–145)

## 2015-11-10 LAB — HEPATIC FUNCTION PANEL
ALT: 22 U/L (ref 0–53)
AST: 15 U/L (ref 0–37)
Albumin: 3.9 g/dL (ref 3.5–5.2)
Alkaline Phosphatase: 51 U/L (ref 39–117)
BILIRUBIN DIRECT: 0.1 mg/dL (ref 0.0–0.3)
TOTAL PROTEIN: 6 g/dL (ref 6.0–8.3)
Total Bilirubin: 0.5 mg/dL (ref 0.2–1.2)

## 2015-11-10 LAB — POC URINALSYSI DIPSTICK (AUTOMATED)
BILIRUBIN UA: NEGATIVE
Glucose, UA: NEGATIVE
KETONES UA: NEGATIVE
LEUKOCYTES UA: NEGATIVE
Nitrite, UA: NEGATIVE
PH UA: 6
PROTEIN UA: NEGATIVE
RBC UA: NEGATIVE
SPEC GRAV UA: 1.02
Urobilinogen, UA: 0.2

## 2015-11-10 LAB — CBC WITH DIFFERENTIAL/PLATELET
BASOS PCT: 0.3 % (ref 0.0–3.0)
Basophils Absolute: 0 10*3/uL (ref 0.0–0.1)
EOS ABS: 0.2 10*3/uL (ref 0.0–0.7)
EOS PCT: 2 % (ref 0.0–5.0)
HEMATOCRIT: 48.7 % (ref 39.0–52.0)
Hemoglobin: 16.3 g/dL (ref 13.0–17.0)
LYMPHS PCT: 26.6 % (ref 12.0–46.0)
Lymphs Abs: 2.1 10*3/uL (ref 0.7–4.0)
MCHC: 33.5 g/dL (ref 30.0–36.0)
MCV: 96.3 fl (ref 78.0–100.0)
MONO ABS: 0.9 10*3/uL (ref 0.1–1.0)
Monocytes Relative: 11.5 % (ref 3.0–12.0)
NEUTROS ABS: 4.6 10*3/uL (ref 1.4–7.7)
Neutrophils Relative %: 59.6 % (ref 43.0–77.0)
PLATELETS: 178 10*3/uL (ref 150.0–400.0)
RBC: 5.05 Mil/uL (ref 4.22–5.81)
RDW: 13 % (ref 11.5–15.5)
WBC: 7.7 10*3/uL (ref 4.0–10.5)

## 2015-11-10 LAB — LIPID PANEL
Cholesterol: 160 mg/dL (ref 0–200)
HDL: 41.3 mg/dL (ref >39.00)
NonHDL: 118.39
TRIGLYCERIDES: 207 mg/dL — AB (ref 0.0–149.0)
Total CHOL/HDL Ratio: 4
VLDL: 41.4 mg/dL — ABNORMAL HIGH (ref 0.0–40.0)

## 2015-11-10 LAB — PSA: PSA: 0.35 ng/mL (ref 0.10–4.00)

## 2015-11-10 LAB — TSH: TSH: 1.12 u[IU]/mL (ref 0.35–4.50)

## 2015-11-10 LAB — LDL CHOLESTEROL, DIRECT: Direct LDL: 87 mg/dL

## 2015-11-17 ENCOUNTER — Encounter: Payer: Self-pay | Admitting: Family Medicine

## 2015-11-17 ENCOUNTER — Ambulatory Visit (INDEPENDENT_AMBULATORY_CARE_PROVIDER_SITE_OTHER): Payer: BC Managed Care – PPO | Admitting: Family Medicine

## 2015-11-17 VITALS — BP 122/80 | HR 74 | Temp 99.3°F | Wt 234.0 lb

## 2015-11-17 DIAGNOSIS — R6889 Other general symptoms and signs: Secondary | ICD-10-CM | POA: Diagnosis not present

## 2015-11-17 DIAGNOSIS — Z0001 Encounter for general adult medical examination with abnormal findings: Secondary | ICD-10-CM

## 2015-11-17 DIAGNOSIS — F331 Major depressive disorder, recurrent, moderate: Secondary | ICD-10-CM

## 2015-11-17 MED ORDER — SERTRALINE HCL 50 MG PO TABS: 50.0000 mg | ORAL_TABLET | Freq: Every day | ORAL | Status: DC

## 2015-11-17 NOTE — Patient Instructions (Addendum)
Only change I would suggest today is to add zoloft to your morning medicines. This is to help with mildly poor control of depression. Follow up in 1 month with me to reevaluate. You are on a low dose and depending on how you are doing- may need to increase. This may cause some mild insomnia/agitation at first- I think the remeron at night can help you manage this  Taking the medicine as directed and not missing any doses is one of the best things you can do to treat your depression.  Here are some things to keep in mind:  1) Side effects (stomach upset, some increased anxiety) may happen before you notice a benefit.  These side effects typically go away over time. 2) Changes to your dose of medicine or a change in medication all together is sometimes necessary 3) Most people need to be on medication at least 6-12 months 4) Many people will notice an improvement within two weeks but the full effect of the medication can take up to 4-6 weeks 5) Stopping the medication when you start feeling better often results in a return of symptoms 6) If you start having thoughts of hurting yourself or others after starting this medicine, call our office immediately at [phone] or seek care through 911.

## 2015-11-17 NOTE — Progress Notes (Addendum)
[name], MD Phone: [phone]  Subjective:  Patient presents today for their annual physical. Chief complaint-noted.   See problem oriented charting- ROS- full  review of systems was completed and negative except for: No chest pain or shortness of breath. No headache or blurry vision. Does endorse some poor control of depression including anhedonia and depressed mood  The following were reviewed and entered/updated in epic: Past Medical History  Diagnosis Date  . DVT (deep venous thrombosis) ([place])   . Allergic rhinitis   . Hyperlipidemia   . Factor V deficiency ([place])   . Colon polyps   . Osteoarthritis   . Internal hemorrhoid 11/24/2009    No bleeding. Patient states thought was external.     . Right rotator cuff tear 12/07/2010    S/p surgery.     Patient Active Problem List   Diagnosis Date Noted  . Former smoker 07/26/2014    Priority: High  . Factor V Leiden ([place]) 07/31/2007    Priority: High  . DVT, HX OF 07/31/2007    Priority: High  . Essential hypertension, benign 07/26/2014    Priority: Medium  . DEPRESSION, MAJOR, RECURRENT, MODERATE 03/24/2009    Priority: Medium  . Hyperlipidemia 07/31/2007    Priority: Medium  . 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 Osteoarthritis 07/15/2008    Priority: Low  . Venous (peripheral) insufficiency 12/11/2007    Priority: Low  . Allergic rhinitis 07/31/2007    Priority: Low  . History of colonic polyps 07/31/2007    Priority: Low  . Short-term memory loss 06/15/2015   Past Surgical History  Procedure Laterality Date  . Right rotator cuff repair    . Salivary stone removal      Family History  Problem Relation Age of Onset  . Factor V Leiden deficiency Son   . Colon cancer Father     Medications- reviewed and updated Current Outpatient Prescriptions  Medication Sig Dispense Refill  . benazepril (LOTENSIN) 20 MG tablet TAKE ONE TABLET BY MOUTH ONCE DAILY 90 tablet 2  .  buPROPion (WELLBUTRIN XL) 300 MG 24 hr tablet TAKE ONE TABLET BY MOUTH ONCE DAILY 90 tablet 3  . celecoxib (CELEBREX) 200 MG capsule TAKE ONE CAPSULE BY MOUTH TWICE DAILY 180 capsule 0  . CRESTOR 20 MG tablet TAKE ONE TABLET BY MOUTH ONCE DAILY 90 tablet 3  . doxycycline (PERIOSTAT) 20 MG tablet     . Loratadine 10 MG CAPS Take 1 capsule (10 mg total) by mouth daily. 90 each 3  . mirtazapine (REMERON SOL-TAB) 15 MG disintegrating tablet DISSOLVE ONE TABLET BY MOUTH ONCE DAILY AT BEDTIME 90 tablet 2  . XARELTO 20 MG TABS tablet TAKE ONE TABLET BY MOUTH ONCE DAILY 90 tablet 2  . sertraline (ZOLOFT) 50 MG tablet Take 1 tablet (50 mg total) by mouth daily. 30 tablet 3   No current facility-administered medications for this visit.    Allergies-reviewed and updated Allergies  Allergen Reactions  . [place] Venom [Bee Venom] Shortness Of Breath    Had swelling localized, chest tightness, and shortness of breath  . Cephalexin     REACTION: solar rash    Social History   Social History  . Marital Status: Married    Spouse Name: N/A  . Number of Children: N/A  . Years of Education: N/A   Social History Main Topics  . Smoking status: Former Smoker    Quit date: 11/06/2014  . Smokeless tobacco: None  .  Alcohol Use: 12.0 - 13.2 oz/week    6-8 Standard drinks or equivalent, 14 Cans of beer per week  . Drug Use: No  . Sexual Activity: Yes   Other Topics Concern  . None   Social History Narrative   Married (wife patient of Dr. Yong Channel). Son and daughter. 1 granddaughter.       Works for [employer]: time around house, time with family, yardwork, some cabinetryh    ROS--See HPI   Objective: BP 122/80 mmHg  Pulse 74  Temp(Src) 99.3 F (37.4 C)  Wt 234 lb (106.142 kg) Gen: NAD, resting comfortably HEENT: Mucous membranes are moist. Oropharynx normal Neck: no thyromegaly CV: RRR no murmurs rubs or gallops Lungs: CTAB no crackles, wheeze,  rhonchi Abdomen: soft/nontender/nondistended/normal bowel sounds. No rebound or guarding.  Rectal: normal tone, normal to small size prostate, no masses or tenderness Ext: no edema Skin: warm, dry, no rash- full above the waist skin exam without obvious melanoma, squamous cell, basal cell cancer or other areas of atypica Neuro: grossly normal, moves all extremities, PERRLA Psych: depressed mood  Assessment/Plan:  65 y.o. male presenting for annual physical.  Health Maintenance counseling: 1. Anticipatory guidance: Patient counseled regarding regular dental exams, eye exams, wearing seatbelts.  2. Risk factor reduction:  Advised patient of need for regular exercise and diet rich and fruits and vegetables to reduce risk of heart attack and stroke. Needs to get back on exercise- winter months have been hard 3. Immunizations/screenings/ancillary studies Health Maintenance Due  Topic Date Due  . Hepatitis C Screening declined Jul 16, 1951   4. Prostate cancer screening- low risk off PSA and rectal   Lab Results  Component Value Date   PSA 0.35 11/10/2015   PSA 0.44 10/28/2014   PSA 0.38 08/13/2013   5. Colon cancer screening - 02/13/15 with 3 year repeat planned 6. Skin cancer screening- advised sunscreen use. Above waist exam today without obvious concerns.   Former smoker- remains abstinent  Factor V Leiden- continues on xarelto 20mg  daily after history of DVT x2, PE 2011 Hyperlipidemia-controlled on crestor 20mg  Hypertension controlled on benazepril 20mg   DEPRESSION, MAJOR, RECURRENT, MODERATE S:controlled on wellbutrin 300mg  and remeron 15mg  previously. No SI/HI. Has had thoughts if his existence matters but not of hurting himself. Poor control with PHQ9 of 8.5 today. Symptoms slowly worsening over last 2 years but particularly worse over last month or two. Exercise has been down which could contribute.  A/P: Start zoloft 50mg  continue other meds- follow up 4 weeks   Return in about 4  weeks (around 12/15/2015). Return precautions advised.   Meds ordered this encounter  Medications  . sertraline (ZOLOFT) 50 MG tablet    Sig: Take 1 tablet (50 mg total) by mouth daily.    Dispense:  30 tablet    Refill:  3

## 2015-11-17 NOTE — Assessment & Plan Note (Addendum)
S:controlled on wellbutrin 300mg  and remeron 15mg  previously. No SI/HI. Has had thoughts if his existence matters but not of hurting himself. Poor control with PHQ9 of 8.5 today. Symptoms slowly worsening over last 2 years but particularly worse over last month or two. Exercise has been down which could contribute.  A/P: Start zoloft 50mg  continue other meds- follow up 4 weeks

## 2015-12-15 ENCOUNTER — Ambulatory Visit (INDEPENDENT_AMBULATORY_CARE_PROVIDER_SITE_OTHER): Payer: BC Managed Care – PPO | Admitting: Family Medicine

## 2015-12-15 ENCOUNTER — Encounter: Payer: Self-pay | Admitting: Family Medicine

## 2015-12-15 VITALS — BP 122/74 | HR 81 | Temp 98.4°F | Wt 225.0 lb

## 2015-12-15 DIAGNOSIS — F331 Major depressive disorder, recurrent, moderate: Secondary | ICD-10-CM | POA: Diagnosis not present

## 2015-12-15 MED ORDER — SERTRALINE HCL 100 MG PO TABS: 100.0000 mg | ORAL_TABLET | Freq: Every day | ORAL | Status: DC

## 2015-12-15 NOTE — Progress Notes (Signed)
[name], MD  Subjective:  Rodney Scott is a 65 y.o. year old very pleasant male patient who presents for/with See problem oriented charting ROS- No SI/HI. Still with depressed mood and some anhedonia though less than previous.   Past Medical History-  Patient Active Problem List   Diagnosis Date Noted  . Former smoker 07/26/2014    Priority: High  . Factor V Leiden ([place]) 07/31/2007    Priority: High  . DVT, HX OF 07/31/2007    Priority: High  . Essential hypertension, benign 07/26/2014    Priority: Medium  . DEPRESSION, MAJOR, RECURRENT, MODERATE 03/24/2009    Priority: Medium  . Hyperlipidemia 07/31/2007    Priority: Medium  . 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 Osteoarthritis 07/15/2008    Priority: Low  . Venous (peripheral) insufficiency 12/11/2007    Priority: Low  . Allergic rhinitis 07/31/2007    Priority: Low  . History of colonic polyps 07/31/2007    Priority: Low  . Short-term memory loss 06/15/2015    Medications- reviewed and updated Current Outpatient Prescriptions  Medication Sig Dispense Refill  . benazepril (LOTENSIN) 20 MG tablet TAKE ONE TABLET BY MOUTH ONCE DAILY 90 tablet 2  . buPROPion (WELLBUTRIN XL) 300 MG 24 hr tablet TAKE ONE TABLET BY MOUTH ONCE DAILY 90 tablet 3  . celecoxib (CELEBREX) 200 MG capsule TAKE ONE CAPSULE BY MOUTH TWICE DAILY 180 capsule 0  . CRESTOR 20 MG tablet TAKE ONE TABLET BY MOUTH ONCE DAILY 90 tablet 3  . Loratadine 10 MG CAPS Take 1 capsule (10 mg total) by mouth daily. 90 each 3  . mirtazapine (REMERON SOL-TAB) 15 MG disintegrating tablet DISSOLVE ONE TABLET BY MOUTH ONCE DAILY AT BEDTIME 90 tablet 2  . sertraline (ZOLOFT) 100 MG tablet Take 1 tablet (100 mg total) by mouth at bedtime. 30 tablet 3  . XARELTO 20 MG TABS tablet TAKE ONE TABLET BY MOUTH ONCE DAILY 90 tablet 2  . doxycycline (PERIOSTAT) 20 MG tablet Reported on 12/15/2015     No current facility-administered medications for  this visit.    Objective: BP 122/74 mmHg  Pulse 81  Temp(Src) 98.4 F (36.9 C)  Wt 225 lb (102.059 kg) Gen: NAD, resting comfortably Psych: depressed mood  Assessment/Plan:  DEPRESSION, MAJOR, RECURRENT, MODERATE S: improved control with phq9 down from 8.5 to 3.5 today. This is with recent addition of zoloft 50mg  to wellbutrin 300mg  XL. Also takes remeron intermittently when remembers- helps with sleep. Still does not really know his purpose on life- he does find meaning caring /raising his 88 year old granddaughter. 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 Sedentary job on salary has been hard for him to adjust. Does have 1 guy at work that when he talks to seems to improve how he is doing but hasnt seen him as much recently. Physical activity is still less than desired through riding bike more with granddaughter now.  A/P: despite improeved phq9 still rates as "somewhat difficult" and seems to underrate difficulties on scoring- we will titrate up to 100mg  zoloft and then follow up in 4-6 weeks. Continue wellbutrin 300mg  XL. Try to take remeron more regularly. Encouraged exercise- he declines other than being active with granddaughter. 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  Return in about 6 weeks (around 01/26/2016) for follow up- or sooner if needed. Return precautions advised.   Meds ordered this encounter  Medications  . sertraline (ZOLOFT) 100 MG tablet    Sig: Take 1 tablet (100 mg total) by mouth at bedtime.    Dispense:  30 tablet    Refill:  3   The duration of face-to-face time during this visit was 20 minutes. Greater than 50% of this time was spent in counseling, explanation of diagnosis, planning of further management, and/or coordination of care.

## 2015-12-15 NOTE — Assessment & Plan Note (Signed)
S: improved control with phq9 down from 8.5 to 3.5 today. This is with recent addition of zoloft 50mg  to wellbutrin 300mg  XL. Also takes remeron intermittently when remembers- helps with sleep. Still does not really know his purpose on life- he does find meaning caring /raising his 64 year old granddaughter. 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 Sedentary job on salary has been hard for him to adjust. Does have 1 guy at work that when he talks to seems to improve how he is doing but hasnt seen him as much recently. Physical activity is still less than desired through riding bike more with granddaughter now.  A/P: despite improeved phq9 still rates as "somewhat difficult" and seems to underrate difficulties on scoring- we will titrate up to 100mg  zoloft and then follow up in 4-6 weeks. Continue wellbutrin 300mg  XL. Try to take remeron more regularly. Encouraged exercise- he declines other than being active with granddaughter. 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

## 2015-12-15 NOTE — Patient Instructions (Signed)
Depression Improving some, but still does not seem to be as well controlled as we would like Increase zoloft to 100mg , continue remeron 15mg  and wellbutrin 300mg   Follow up in 4-6 weeks  Taking the medicine as directed and not missing any doses is one of the best things you can do to treat your depression.  Here are some things to keep in mind:  1) Side effects (stomach upset, some increased anxiety) may happen before you notice a benefit.  These side effects typically go away over time. 2) Changes to your dose of medicine or a change in medication all together is sometimes necessary 3) Most people need to be on medication at least 6-12 months 4) Many people will notice an improvement within two weeks but the full effect of the medication can take up to 4-6 weeks 5) Stopping the medication when you start feeling better often results in a return of symptoms 6) If you start having thoughts of hurting yourself or others after starting this medicine, call our office immediately at [phone] or seek care through 911.

## 2016-02-02 ENCOUNTER — Encounter: Payer: Self-pay | Admitting: Family Medicine

## 2016-02-02 ENCOUNTER — Ambulatory Visit (INDEPENDENT_AMBULATORY_CARE_PROVIDER_SITE_OTHER): Payer: BC Managed Care – PPO | Admitting: Family Medicine

## 2016-02-02 VITALS — BP 130/86 | HR 74 | Temp 99.2°F | Wt 225.0 lb

## 2016-02-02 DIAGNOSIS — F331 Major depressive disorder, recurrent, moderate: Secondary | ICD-10-CM | POA: Diagnosis not present

## 2016-02-02 DIAGNOSIS — I10 Essential (primary) hypertension: Secondary | ICD-10-CM | POA: Diagnosis not present

## 2016-02-02 NOTE — Assessment & Plan Note (Addendum)
S: Patient is compliant with Wellbutrin xl 300mg , zoloft 100mg . PHQ9 now down to 2.5 from peak of 8.5. I still believe patient underreports some. He states he feels much better with the current combination. He states he has come off of loratadine which helped with some mental fall. He asks about his memory and his mini cog 4/5 which normal. A/P: Counseling provided. We will continue current dose of medication. Return precautions given. Follow up in 3 months.

## 2016-02-02 NOTE — Progress Notes (Signed)
Subjective:  Rodney Scott is a 65 y.o. year old very pleasant male patient who presents for/with See problem oriented charting ROS- no suicidal or homicidal ideation. No chest pain or shortness of breath.  Past Medical History-  Patient Active Problem List   Diagnosis Date Noted  . Former smoker 07/26/2014    Priority: High  . Factor V Leiden ([place]) 07/31/2007    Priority: High  . DVT, HX OF 07/31/2007    Priority: High  . Essential hypertension, benign 07/26/2014    Priority: Medium  . DEPRESSION, MAJOR, RECURRENT, MODERATE 03/24/2009    Priority: Medium  . Hyperlipidemia 07/31/2007    Priority: Medium  . 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 Osteoarthritis 07/15/2008    Priority: Low  . Venous (peripheral) insufficiency 12/11/2007    Priority: Low  . Allergic rhinitis 07/31/2007    Priority: Low  . History of colonic polyps 07/31/2007    Priority: Low  . Short-term memory loss 06/15/2015    Medications- reviewed and updated Current Outpatient Prescriptions  Medication Sig Dispense Refill  . benazepril (LOTENSIN) 20 MG tablet TAKE ONE TABLET BY MOUTH ONCE DAILY 90 tablet 2  . buPROPion (WELLBUTRIN XL) 300 MG 24 hr tablet TAKE ONE TABLET BY MOUTH ONCE DAILY 90 tablet 3  . CRESTOR 20 MG tablet TAKE ONE TABLET BY MOUTH ONCE DAILY 90 tablet 3  . doxycycline (PERIOSTAT) 20 MG tablet Reported on 12/15/2015    . mirtazapine (REMERON SOL-TAB) 15 MG disintegrating tablet DISSOLVE ONE TABLET BY MOUTH ONCE DAILY AT BEDTIME 90 tablet 2  . sertraline (ZOLOFT) 100 MG tablet Take 1 tablet (100 mg total) by mouth at bedtime. 30 tablet 3  . XARELTO 20 MG TABS tablet TAKE ONE TABLET BY MOUTH ONCE DAILY 90 tablet 2   No current facility-administered medications for this visit.    Objective: BP 130/86 mmHg  Pulse 74  Temp(Src) 99.2 F (37.3 C)  Wt 225 lb (102.059 kg) Gen: NAD, resting comfortably CV: RRR no murmurs rubs or gallops Lungs: CTAB no crackles,  wheeze, rhonchi Abdomen: soft/nontender/nondistended/normal bowel sounds. No rebound or guarding.  Ext: no edema Skin: warm, dry Neuro: grossly normal, moves all extremities  Assessment/Plan:  DEPRESSION, MAJOR, RECURRENT, MODERATE S: Patient is compliant with Wellbutrin xl 300mg , zoloft 100mg . PHQ9 now down to 2.5 from peak of 8.5. I still believe patient underreports some. He states he feels much better with the current combination. He states he has come off of loratadine which helped with some mental fall. He asks about his memory and his mini cog 4/5 which normal. A/P: Counseling provided. We will continue current dose of medication. Return precautions given. Follow up in 3 months.   Essential hypertension, benign S: controlled on repeat on benazepril 20 mg BP Readings from Last 3 Encounters:  02/02/16 130/86  12/15/15 122/74  11/17/15 122/80  A/P:Continue current meds:  No change   3 months verbal. Return precautions advised.   The duration of face-to-face time during this visit was 15 minutes. Greater than 50% of this time was spent in counseling, explanation of diagnosis, planning of further management, and/or coordination of care.    [name], MD

## 2016-02-02 NOTE — Patient Instructions (Signed)
I am glad you are feeling better. Continue current doses of medicine listed. Stretch follow up to 3 months- if stable at that visit move to 6.   If you have worsening symptoms of depression please follow up with me sooner

## 2016-02-02 NOTE — Assessment & Plan Note (Signed)
S: controlled on repeat on benazepril 20 mg BP Readings from Last 3 Encounters:  02/02/16 130/86  12/15/15 122/74  11/17/15 122/80  A/P:Continue current meds:  No change

## 2016-03-18 ENCOUNTER — Other Ambulatory Visit: Payer: Self-pay | Admitting: Family Medicine

## 2016-03-20 NOTE — Telephone Encounter (Signed)
Rx not listed on current medication list

## 2016-03-21 NOTE — Telephone Encounter (Signed)
4 hours ago I said- yes thanks, may refill 2 hours ago- you sent this back to me  You may refill this- do not have to send back to me again

## 2016-03-21 NOTE — Telephone Encounter (Signed)
Yes thanks, may refill 

## 2016-03-25 NOTE — Telephone Encounter (Signed)
Rx refill sent to pharmacy. 

## 2016-05-03 ENCOUNTER — Encounter: Payer: Self-pay | Admitting: Family Medicine

## 2016-05-03 ENCOUNTER — Ambulatory Visit (INDEPENDENT_AMBULATORY_CARE_PROVIDER_SITE_OTHER): Payer: BC Managed Care – PPO | Admitting: Family Medicine

## 2016-05-03 VITALS — BP 118/78 | HR 80 | Temp 98.8°F | Ht 75.5 in | Wt 230.0 lb

## 2016-05-03 DIAGNOSIS — E785 Hyperlipidemia, unspecified: Secondary | ICD-10-CM

## 2016-05-03 DIAGNOSIS — F331 Major depressive disorder, recurrent, moderate: Secondary | ICD-10-CM

## 2016-05-03 DIAGNOSIS — I10 Essential (primary) hypertension: Secondary | ICD-10-CM

## 2016-05-03 DIAGNOSIS — D6851 Activated protein C resistance: Secondary | ICD-10-CM | POA: Diagnosis not present

## 2016-05-03 MED ORDER — DESVENLAFAXINE SUCCINATE ER 50 MG PO TB24: 50.0000 mg | ORAL_TABLET | Freq: Every day | ORAL | Status: DC

## 2016-05-03 NOTE — Progress Notes (Signed)
Pre visit review using our clinic review tool, if applicable. No additional management support is needed unless otherwise documented below in the visit note. 

## 2016-05-03 NOTE — Assessment & Plan Note (Signed)
S: compliant with wellbutrin 300mg  XL, zoloft 100mg . PHQ9 of 8.5 at peak- down to 2.5 last visit but I believe under reported. REports 4 today and worsening symptoms. Sister is psychiatrist and mentioned pristiq- he would like to try.  A/P: continue to believe underreported- think change to pristiq from zoloft reasonable with continued wellbutrin

## 2016-05-03 NOTE — Patient Instructions (Addendum)
Trial Pristiq 50mg  - stop the zoloft tonight and take first pristiq. See me back in 6 weeks and we will reevaluate. May need to go to 100mg  dose at that time.   No other changes to medications  Taking the medicine (pristiq) as directed and not missing any doses is one of the best things you can do to treat your depression.  Here are some things to keep in mind:  1) Side effects (stomach upset, some increased anxiety) may happen before you notice a benefit.  These side effects typically go away over time. 2) Changes to your dose of medicine or a change in medication all together is sometimes necessary 3) Most people need to be on medication at least 6-12 months 4) Many people will notice an improvement within two weeks but the full effect of the medication can take up to 4-6 weeks 5) Stopping the medication when you start feeling better often results in a return of symptoms 6) If you start having thoughts of hurting yourself or others after starting this medicine, call our office immediately at [phone] or seek care through 911.

## 2016-05-03 NOTE — Progress Notes (Signed)
Subjective:  Rodney Scott is a 65 y.o. year old very pleasant male patient who presents for/with See problem oriented charting ROS- no si/hi. Endorses depressed mood and anhedonia at times. No chest pain or shortness of breath. No headache or blurry vision.see any ROS included in HPI as well.   Past Medical History-  Patient Active Problem List   Diagnosis Date Noted  . Former smoker 07/26/2014    Priority: High  . Factor V Leiden ([place]) 07/31/2007    Priority: High  . DVT, HX OF 07/31/2007    Priority: High  . Essential hypertension, benign 07/26/2014    Priority: Medium  . DEPRESSION, MAJOR, RECURRENT, MODERATE 03/24/2009    Priority: Medium  . Hyperlipidemia 07/31/2007    Priority: Medium  . 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 Osteoarthritis 07/15/2008    Priority: Low  . Venous (peripheral) insufficiency 12/11/2007    Priority: Low  . Allergic rhinitis 07/31/2007    Priority: Low  . History of colonic polyps 07/31/2007    Priority: Low  . Short-term memory loss 06/15/2015    Medications- reviewed and updated Current Outpatient Prescriptions  Medication Sig Dispense Refill  . benazepril (LOTENSIN) 20 MG tablet TAKE ONE TABLET BY MOUTH ONCE DAILY 90 tablet 2  . buPROPion (WELLBUTRIN XL) 300 MG 24 hr tablet TAKE ONE TABLET BY MOUTH ONCE DAILY 90 tablet 3  . celecoxib (CELEBREX) 200 MG capsule TAKE ONE CAPSULE BY MOUTH TWICE DAILY 180 capsule 0  . CRESTOR 20 MG tablet TAKE ONE TABLET BY MOUTH ONCE DAILY 90 tablet 3  . doxycycline (PERIOSTAT) 20 MG tablet Reported on 12/15/2015    . mirtazapine (REMERON SOL-TAB) 15 MG disintegrating tablet DISSOLVE ONE TABLET BY MOUTH ONCE DAILY AT BEDTIME 90 tablet 2  . sertraline (ZOLOFT) 100 MG tablet Take 1 tablet (100 mg total) by mouth at bedtime. 30 tablet 3  . XARELTO 20 MG TABS tablet TAKE ONE TABLET BY MOUTH ONCE DAILY 90 tablet 2   No current facility-administered medications for this visit.     Objective: BP 118/78 mmHg  Pulse 80  Temp(Src) 98.8 F (37.1 C) (Oral)  Ht 6' 3.5" (1.918 m)  Wt 230 lb (104.327 kg)  BMI 28.36 kg/m2  SpO2 99% Gen: NAD, resting comfortably CV: RRR no murmurs rubs or gallops Lungs: CTAB no crackles, wheeze, rhonchi Ext: 1+ edema left leg, trace on right- chronic issue safter prior dvts Skin: warm, dry Neuro: grossly normal, moves all extremities  Assessment/Plan:  Hypertension    S: controlled on benazepril 20mg  alone BP Readings from Last 3 Encounters:  05/03/16 118/78  02/02/16 130/86  12/15/15 122/74  A/P:Continue current medicationl  Hyperlipidemia S: well controlled on crestor 20mg  with LDL 87 in 2017. No myalgias.  Lab Results  Component Value Date   CHOL 160 11/10/2015   HDL 41.30 11/10/2015   LDLCALC 105* 08/13/2013   LDLDIRECT 87.0 11/10/2015   TRIG 207.0* 11/10/2015   CHOLHDL 4 11/10/2015   A/P: continue rx at same dose  Poison ivy on hands and legs over a week- declines intervention   DEPRESSION, MAJOR, RECURRENT, MODERATE S: compliant with wellbutrin 300mg  XL, zoloft 100mg . PHQ9 of 8.5 at peak- down to 2.5 last visit but I believe under reported. REports 4 today and worsening symptoms. Sister is psychiatrist and mentioned pristiq- he would like to try.  A/P: continue to believe underreported- think change to pristiq from zoloft reasonable with continued wellbutrin  Factor V Leiden  S: continues on xarelto with history of DVT/PE x 2 A/P: continue xarelto, glad patient has not returned to smoking. Advised to stop celebrex for aches and pains- tylenol only. Given bleeding risk with pristiq, xarelto and celebrex    Return in about 2 months (around 07/04/2016).  Meds ordered this encounter  Medications  . desvenlafaxine (PRISTIQ) 50 MG 24 hr tablet    Sig: Take 1 tablet (50 mg total) by mouth daily.    Dispense:  30 tablet    Refill:  5    Return precautions advised.  [name], MD

## 2016-05-03 NOTE — Assessment & Plan Note (Signed)
S: continues on xarelto with history of DVT/PE x 2 A/P: continue xarelto, glad patient has not returned to smoking. Advised to stop celebrex for aches and pains- tylenol only. Given bleeding risk with pristiq, xarelto and celebrex

## 2016-05-04 ENCOUNTER — Other Ambulatory Visit: Payer: Self-pay | Admitting: Family Medicine

## 2016-06-02 ENCOUNTER — Other Ambulatory Visit: Payer: Self-pay | Admitting: Family Medicine

## 2016-07-05 ENCOUNTER — Ambulatory Visit (INDEPENDENT_AMBULATORY_CARE_PROVIDER_SITE_OTHER): Payer: BC Managed Care – PPO | Admitting: Family Medicine

## 2016-07-05 DIAGNOSIS — F331 Major depressive disorder, recurrent, moderate: Secondary | ICD-10-CM

## 2016-07-05 DIAGNOSIS — Z23 Encounter for immunization: Secondary | ICD-10-CM

## 2016-07-05 DIAGNOSIS — D6851 Activated protein C resistance: Secondary | ICD-10-CM | POA: Diagnosis not present

## 2016-07-05 DIAGNOSIS — I10 Essential (primary) hypertension: Secondary | ICD-10-CM | POA: Diagnosis not present

## 2016-07-05 DIAGNOSIS — E785 Hyperlipidemia, unspecified: Secondary | ICD-10-CM

## 2016-07-05 NOTE — Progress Notes (Signed)
Subjective:  Rodney Scott is a 65 y.o. year old very pleasant male patient who presents for/with See problem oriented charting ROS- No chest pain or shortness of breath. No headache or blurry vision.  No SI.see any ROS included in HPI as well.   Past Medical History-  Patient Active Problem List   Diagnosis Date Noted  . Former smoker 07/26/2014    Priority: High  . Factor V Leiden ([place]) 07/31/2007    Priority: High  . DVT, HX OF 07/31/2007    Priority: High  . Essential hypertension, benign 07/26/2014    Priority: Medium  . DEPRESSION, MAJOR, RECURRENT, MODERATE 03/24/2009    Priority: Medium  . Hyperlipidemia 07/31/2007    Priority: Medium  . 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 Osteoarthritis 07/15/2008    Priority: Low  . Venous (peripheral) insufficiency 12/11/2007    Priority: Low  . Allergic rhinitis 07/31/2007    Priority: Low  . History of colonic polyps 07/31/2007    Priority: Low  . Short-term memory loss 06/15/2015    Medications- reviewed and updated Current Outpatient Prescriptions  Medication Sig Dispense Refill  . benazepril (LOTENSIN) 20 MG tablet TAKE ONE TABLET BY MOUTH ONCE DAILY 90 tablet 2  . buPROPion (WELLBUTRIN XL) 300 MG 24 hr tablet TAKE ONE TABLET BY MOUTH ONCE DAILY 90 tablet 3  . CRESTOR 20 MG tablet TAKE ONE TABLET BY MOUTH ONCE DAILY 90 tablet 3  . desvenlafaxine (PRISTIQ) 50 MG 24 hr tablet Take 1 tablet (50 mg total) by mouth daily. 30 tablet 5  . doxycycline (PERIOSTAT) 20 MG tablet Reported on 12/15/2015    . mirtazapine (REMERON SOL-TAB) 15 MG disintegrating tablet DISSOLVE ONE TABLET BY MOUTH ONCE DAILY AT BEDTIME 90 tablet 2  . XARELTO 20 MG TABS tablet TAKE ONE TABLET BY MOUTH ONCE DAILY 90 tablet 1   No current facility-administered medications for this visit.     Objective: There were no vitals taken for this visit. Gen: NAD, resting comfortably CV: RRR no murmurs rubs or gallops Lungs: CTAB no  crackles, wheeze, rhonchi Abdomen: soft/nontender/nondistended/normal bowel sounds.  Ext: no edema Skin: warm, dry  Assessment/Plan:  DEPRESSION, MAJOR, RECURRENT, MODERATE S: I hav ebeen concerned that he underreports on PHQ9 in past and did not use today. He states he is stable to slightly better on pristiq 50mg  instead of zoloft 50mg . Continues on wellbutrin 300mg  XR. remeron takes prn for sleep.  A/P: continue current meds as doing reasonably well- No SI/HI   Factor V Leiden S: no recurrence of DVT or PE since 2011 on xarelto 20mg - complains of easy bruising A/P: continue xarelto. Creatinine normal so stay at 20mg .   Hypertension S: controlled on benazepril 20mg  BP Readings from Last 3 Encounters:  05/03/16 118/78  02/02/16 130/86  12/15/15 122/74  A/P:Continue current medicatoins  S: doing well.  controlled on crestor 20mg . No myalgias.  Lab Results  Component Value Date   CHOL 160 11/10/2015   HDL 41.30 11/10/2015   LDLCALC 105 (H) 08/13/2013   LDLDIRECT 87.0 11/10/2015   TRIG 207.0 (H) 11/10/2015   CHOLHDL 4 11/10/2015   A/P: continue current meds with LDL <100.    Return in about 5 months (around 12/05/2016) for physical.  Orders Placed This Encounter  Procedures  . Flu Vaccine QUAD 36+ mos IM   Return precautions advised.  [name], MD

## 2016-07-05 NOTE — Patient Instructions (Signed)
No  Changes today  Thanks for getting flu shot

## 2016-07-06 ENCOUNTER — Encounter: Payer: Self-pay | Admitting: Family Medicine

## 2016-07-06 NOTE — Assessment & Plan Note (Signed)
S: no recurrence of DVT or PE since 2011 on xarelto 20mg - complains of easy bruising A/P: continue xarelto. Creatinine normal so stay at 20mg .

## 2016-07-06 NOTE — Assessment & Plan Note (Signed)
S: I hav ebeen concerned that he underreports on PHQ9 in past and did not use today. He states he is stable to slightly better on pristiq 50mg  instead of zoloft 50mg . Continues on wellbutrin 300mg  XR. remeron takes prn for sleep.  A/P: continue current meds as doing reasonably well- No SI/HI

## 2016-09-13 ENCOUNTER — Other Ambulatory Visit: Payer: Self-pay | Admitting: Family Medicine

## 2016-11-24 ENCOUNTER — Other Ambulatory Visit: Payer: Self-pay | Admitting: Family Medicine

## 2016-11-28 ENCOUNTER — Other Ambulatory Visit (INDEPENDENT_AMBULATORY_CARE_PROVIDER_SITE_OTHER): Payer: BC Managed Care – PPO

## 2016-11-28 DIAGNOSIS — Z0000 Encounter for general adult medical examination without abnormal findings: Secondary | ICD-10-CM | POA: Diagnosis not present

## 2016-11-28 LAB — LIPID PANEL
CHOL/HDL RATIO: 3
Cholesterol: 151 mg/dL (ref 0–200)
HDL: 58.7 mg/dL (ref >39.00)
LDL CALC: 72 mg/dL (ref 0–99)
NONHDL: 92.09
TRIGLYCERIDES: 98 mg/dL (ref 0.0–149.0)
VLDL: 19.6 mg/dL (ref 0.0–40.0)

## 2016-11-28 LAB — CBC WITH DIFFERENTIAL/PLATELET
BASOS PCT: 0.5 % (ref 0.0–3.0)
Basophils Absolute: 0 10*3/uL (ref 0.0–0.1)
EOS ABS: 0.1 10*3/uL (ref 0.0–0.7)
Eosinophils Relative: 1.9 % (ref 0.0–5.0)
HEMATOCRIT: 42.6 % (ref 39.0–52.0)
HEMOGLOBIN: 14.5 g/dL (ref 13.0–17.0)
LYMPHS PCT: 28.1 % (ref 12.0–46.0)
Lymphs Abs: 1.8 10*3/uL (ref 0.7–4.0)
MCHC: 34.1 g/dL (ref 30.0–36.0)
MCV: 97.4 fl (ref 78.0–100.0)
MONOS PCT: 12 % (ref 3.0–12.0)
Monocytes Absolute: 0.8 10*3/uL (ref 0.1–1.0)
NEUTROS ABS: 3.7 10*3/uL (ref 1.4–7.7)
Neutrophils Relative %: 57.5 % (ref 43.0–77.0)
PLATELETS: 195 10*3/uL (ref 150.0–400.0)
RBC: 4.37 Mil/uL (ref 4.22–5.81)
RDW: 13.2 % (ref 11.5–15.5)
WBC: 6.5 10*3/uL (ref 4.0–10.5)

## 2016-11-28 LAB — BASIC METABOLIC PANEL
BUN: 18 mg/dL (ref 6–23)
CHLORIDE: 107 meq/L (ref 96–112)
CO2: 31 meq/L (ref 19–32)
Calcium: 9.1 mg/dL (ref 8.4–10.5)
Creatinine, Ser: 0.89 mg/dL (ref 0.40–1.50)
GFR: 91.21 mL/min (ref >60.00)
Glucose, Bld: 101 mg/dL — ABNORMAL HIGH (ref 70–99)
POTASSIUM: 4.5 meq/L (ref 3.5–5.1)
SODIUM: 141 meq/L (ref 135–145)

## 2016-11-28 LAB — POC URINALSYSI DIPSTICK (AUTOMATED)
BILIRUBIN UA: NEGATIVE
Blood, UA: NEGATIVE
GLUCOSE UA: NEGATIVE
KETONES UA: NEGATIVE
LEUKOCYTES UA: NEGATIVE
Nitrite, UA: NEGATIVE
PH UA: 7
Protein, UA: NEGATIVE
Spec Grav, UA: 1.02
Urobilinogen, UA: 0.2

## 2016-11-28 LAB — HEPATIC FUNCTION PANEL
ALBUMIN: 3.7 g/dL (ref 3.5–5.2)
ALK PHOS: 45 U/L (ref 39–117)
ALT: 22 U/L (ref 0–53)
AST: 17 U/L (ref 0–37)
BILIRUBIN DIRECT: 0.1 mg/dL (ref 0.0–0.3)
TOTAL PROTEIN: 5.7 g/dL — AB (ref 6.0–8.3)
Total Bilirubin: 0.6 mg/dL (ref 0.2–1.2)

## 2016-11-28 LAB — TSH: TSH: 0.92 u[IU]/mL (ref 0.35–4.50)

## 2016-11-28 LAB — PSA: PSA: 0.39 ng/mL (ref 0.10–4.00)

## 2016-12-05 ENCOUNTER — Ambulatory Visit (INDEPENDENT_AMBULATORY_CARE_PROVIDER_SITE_OTHER): Payer: BC Managed Care – PPO | Admitting: Family Medicine

## 2016-12-05 ENCOUNTER — Encounter: Payer: Self-pay | Admitting: Family Medicine

## 2016-12-05 VITALS — BP 150/92 | HR 75 | Temp 98.8°F | Ht 75.0 in | Wt 237.0 lb

## 2016-12-05 DIAGNOSIS — I10 Essential (primary) hypertension: Secondary | ICD-10-CM | POA: Diagnosis not present

## 2016-12-05 DIAGNOSIS — R739 Hyperglycemia, unspecified: Secondary | ICD-10-CM | POA: Insufficient documentation

## 2016-12-05 DIAGNOSIS — M1990 Unspecified osteoarthritis, unspecified site: Secondary | ICD-10-CM

## 2016-12-05 DIAGNOSIS — K4090 Unilateral inguinal hernia, without obstruction or gangrene, not specified as recurrent: Secondary | ICD-10-CM | POA: Diagnosis not present

## 2016-12-05 DIAGNOSIS — F331 Major depressive disorder, recurrent, moderate: Secondary | ICD-10-CM | POA: Diagnosis not present

## 2016-12-05 DIAGNOSIS — Z0000 Encounter for general adult medical examination without abnormal findings: Secondary | ICD-10-CM | POA: Diagnosis not present

## 2016-12-05 HISTORY — DX: Unilateral inguinal hernia, without obstruction or gangrene, not specified as recurrent: K40.90

## 2016-12-05 HISTORY — DX: Hyperglycemia, unspecified: R73.9

## 2016-12-05 NOTE — Progress Notes (Addendum)
Phone: [phone]  Subjective:  Patient presents today for their annual physical. Chief complaint-noted.   See problem oriented charting- ROS- full  review of systems was completed and negative except for: mild bulge in left groin  The following were reviewed and entered/updated in epic: Past Medical History:  Diagnosis Date  . Allergic rhinitis   . Colon polyps   . DVT (deep venous thrombosis) ([place])   . Factor V deficiency ([place])   . Hyperlipidemia   . Internal hemorrhoid 11/24/2009   No bleeding. Patient states thought was external.     . Osteoarthritis   . Right rotator cuff tear 12/07/2010   S/p surgery.     Patient Active Problem List   Diagnosis Date Noted  . Former smoker 07/26/2014    Priority: High  . Factor V Leiden ([place]) 07/31/2007    Priority: High  . DVT, HX OF 07/31/2007    Priority: High  . Hyperglycemia 12/05/2016    Priority: Medium  . Short-term memory loss 06/15/2015    Priority: Medium  . Essential hypertension, benign 07/26/2014    Priority: Medium  . DEPRESSION, MAJOR, RECURRENT, MODERATE 03/24/2009    Priority: Medium  . Hyperlipidemia 07/31/2007    Priority: Medium  . 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 Osteoarthritis 07/15/2008    Priority: Low  . Venous (peripheral) insufficiency 12/11/2007    Priority: Low  . Allergic rhinitis 07/31/2007    Priority: Low  . History of colonic polyps 07/31/2007    Priority: Low  . Left inguinal hernia 12/05/2016   Past Surgical History:  Procedure Laterality Date  . right rotator cuff repair    . SALIVARY STONE REMOVAL      Family History  Problem Relation Age of Onset  . Factor V Leiden deficiency Son   . Colon cancer Father     Medications- reviewed and updated Current Outpatient Prescriptions  Medication Sig Dispense Refill  . benazepril (LOTENSIN) 20 MG tablet TAKE ONE TABLET BY MOUTH ONCE DAILY 90 tablet 2  . buPROPion (WELLBUTRIN XL) 300 MG 24 hr tablet  TAKE ONE TABLET BY MOUTH ONCE DAILY 90 tablet 3  . desvenlafaxine (PRISTIQ) 50 MG 24 hr tablet TAKE ONE TABLET BY MOUTH ONCE DAILY 30 tablet 5  . doxycycline (PERIOSTAT) 20 MG tablet Reported on 12/15/2015    . mirtazapine (REMERON SOL-TAB) 15 MG disintegrating tablet DISSOLVE ONE TABLET BY MOUTH ONCE DAILY AT BEDTIME 90 tablet 2  . rosuvastatin (CRESTOR) 20 MG tablet TAKE ONE TABLET BY MOUTH ONCE DAILY 90 tablet 3  . XARELTO 20 MG TABS tablet TAKE ONE TABLET BY MOUTH ONCE DAILY 90 tablet 1   Allergies-reviewed and updated Allergies  Allergen Reactions  . Yellow Jacket Venom [Bee Venom] Shortness Of Breath    Had swelling localized, chest tightness, and shortness of breath  . Cephalexin     REACTION: solar rash    Social History   Social History  . Marital status: Married    Spouse name: N/A  . Number of children: N/A  . Years of education: N/A   Social History Main Topics  . Smoking status: Former Smoker    Quit date: 11/06/2014  . Smokeless tobacco: Never Used  . Alcohol use 12.0 - 13.2 oz/week    14 Cans of beer, 6 - 8 Standard drinks or equivalent per week  . Drug use: No  . Sexual activity: Yes   Other Topics Concern  . None   Social  History Narrative   Married (wife patient of Dr. Yong Channel). Son and daughter. 1 granddaughter.       Works for [employer]: time around house, time with family, yardwork, some cabinetryh    Objective: BP (!) 150/92 (BP Location: Left Arm, Patient Position: Sitting, Cuff Size: Large)   Pulse 75   Temp 98.8 F (37.1 C) (Oral)   Ht 6\' 3"  (D34-534 m)   Wt 237 lb (107.5 kg)   SpO2 97%   BMI 29.62 kg/m  Gen: NAD, resting comfortably HEENT: Mucous membranes are moist. Oropharynx normal Neck: no thyromegaly CV: RRR no murmurs rubs or gallops Lungs: CTAB no crackles, wheeze, rhonchi Abdomen: soft/nontender/nondistended/normal bowel sounds. No rebound or guarding.  Ext: no edema on R, trace on left Skin:  warm, dry Neuro: grossly normal, moves all extremities, PERRLA Rectal: normal tone, diffusely enlarged prostate, no masses or tenderness GU: left hernia inguinal  Assessment/Plan:  66 y.o. male presenting for annual physical.  Health Maintenance counseling: 1. Anticipatory guidance: Patient counseled regarding regular dental exams q4 months, eye exams - needs updated, wearing seatbelts.  2. Risk factor reduction:  Advised patient of need for regular exercise and diet rich and fruits and vegetables to reduce risk of heart attack and stroke. Exercise- active with work- also advised outside of work. Diet-. Discussed cutting beer from 2-3 a day down to 1-2 as weight trending up. Does well on veggies. Encouraged cutting portion size Wt Readings from Last 3 Encounters:  12/05/16 237 lb (107.5 kg)  05/03/16 230 lb (104.3 kg)  02/02/16 225 lb (102.1 kg)  3. Immunizations/screenings/ancillary studies Immunization History  Administered Date(s) Administered  . Influenza Split 07/26/2011, 08/07/2012  . Influenza Whole 07/31/2007, 07/15/2008, 07/07/2009, 06/29/2010  . Influenza,inj,Quad PF,36+ Mos 08/20/2013, 07/26/2014, 06/15/2015, 07/05/2016  . Pneumococcal Polysaccharide-23 06/22/2010  . Td 10/14/2001  . Tdap 01/03/2012  4. Prostate cancer screening- mild BPH. No nodules and trend not concerning- monitor yearly is his preference. Would do at least through 46.   Lab Results  Component Value Date   PSA 0.39 11/28/2016   PSA 0.35 11/10/2015   PSA 0.44 10/28/2014   5. Colon cancer screening - 02/2015 with Dr. Fuller Plan had adenoma- 3 year repeat was noted 6. Skin cancer screening/prevention- advised regular sunscreen. Does not see dermatology  Status of chronic or acute concerns  Factor V Leiden with history dvt- xarelto 20mg   Former smoker- no recent relapse   HLD- LDL at 40- reasonable for primary prevention  Venous insufficiency- continues compression stockings. Worse in leg that had DVT L.  About every other day on left side  Essential hypertension, benign HTN- controlled on benazepril 20mg  in the past. Took sudafed this morning- advised against this . BP Readings from Last 3 Encounters:  12/05/16 (!) 150/92  05/03/16 118/78  02/02/16 130/86     Osteoarthritis OA with cervical disc disease- prn celebrex in past- now off due to xarelto  DEPRESSION, MAJOR, RECURRENT, MODERATE Depression- we changed to pristiq on prior visit as sister had good experience with this (has had improved control. PHQ9 remains at 3 with NO SI and no anhedonia), also on wellbutrin. Also takes remeron prn for sleep in past- has run out and does not want to refill  Left inguinal hernia Declines surgery referral- minimally bothering him. 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 Usually can control with benadryl  Return in  about 6 months (around 06/04/2017) for follow up- or sooner if needed.  Return precautions advised.  [name], MD

## 2016-12-05 NOTE — Assessment & Plan Note (Signed)
Depression- we changed to pristiq on prior visit as sister had good experience with this (has had improved control. PHQ9 remains at 3 with NO SI and no anhedonia), also on wellbutrin. Also takes remeron prn for sleep in past- has run out and does not want to refill

## 2016-12-05 NOTE — Assessment & Plan Note (Signed)
OA with cervical disc disease- prn celebrex in past- now off due to xarelto

## 2016-12-05 NOTE — Progress Notes (Signed)
Pre visit review using our clinic review tool, if applicable. No additional management support is needed unless otherwise documented below in the visit note. 

## 2016-12-05 NOTE — Assessment & Plan Note (Signed)
HTN- controlled on benazepril 20mg  in the past. Took sudafed this morning- advised against this . BP Readings from Last 3 Encounters:  12/05/16 (!) 150/92  05/03/16 118/78  02/02/16 130/86

## 2016-12-05 NOTE — Patient Instructions (Addendum)
Declined surgery referral for now- minimally bothering him. Can call in if he changes his mind or starts bothering him more  Would avoid sudafed as can run the blood pressure up- mucinex, loratadine, flonase are safe given your blood pressure   ______________________________________________________________________  Starting October 1st 2018, I will be transferring to our new location: [place] (corner of [address] [employer]) [place], [place] Phone: [phone]  I would love to have you remain my patient at this new location as long as it remains convenient for you. I am excited about the opportunity to have x-ray and sports medicine in the new building but will really miss the awesome staff and physicians at [facility]. Continue to schedule appointments at [facility] and we will automatically transfer them to the [place] location starting October 1st.

## 2016-12-05 NOTE — Assessment & Plan Note (Signed)
Declines surgery referral- minimally bothering him. Can call in if he changes his mind

## 2017-03-30 ENCOUNTER — Other Ambulatory Visit: Payer: Self-pay | Admitting: Family Medicine

## 2017-06-05 ENCOUNTER — Ambulatory Visit (INDEPENDENT_AMBULATORY_CARE_PROVIDER_SITE_OTHER): Payer: Medicare Other | Admitting: Family Medicine

## 2017-06-05 ENCOUNTER — Encounter: Payer: Self-pay | Admitting: Family Medicine

## 2017-06-05 VITALS — BP 124/82 | HR 69 | Temp 98.1°F | Ht 75.0 in | Wt 237.2 lb

## 2017-06-05 DIAGNOSIS — E785 Hyperlipidemia, unspecified: Secondary | ICD-10-CM | POA: Diagnosis not present

## 2017-06-05 DIAGNOSIS — D6851 Activated protein C resistance: Secondary | ICD-10-CM | POA: Diagnosis not present

## 2017-06-05 DIAGNOSIS — I10 Essential (primary) hypertension: Secondary | ICD-10-CM

## 2017-06-05 DIAGNOSIS — Z23 Encounter for immunization: Secondary | ICD-10-CM | POA: Diagnosis not present

## 2017-06-05 DIAGNOSIS — F331 Major depressive disorder, recurrent, moderate: Secondary | ICD-10-CM | POA: Diagnosis not present

## 2017-06-05 DIAGNOSIS — Z87891 Personal history of nicotine dependence: Secondary | ICD-10-CM | POA: Diagnosis not present

## 2017-06-05 MED ORDER — DESVENLAFAXINE SUCCINATE ER 50 MG PO TB24: 50.0000 mg | ORAL_TABLET | Freq: Every day | ORAL | 3 refills | Status: DC

## 2017-06-05 MED ORDER — RIVAROXABAN 20 MG PO TABS: 20.0000 mg | ORAL_TABLET | Freq: Every day | ORAL | 3 refills | Status: DC

## 2017-06-05 NOTE — Progress Notes (Signed)
Subjective:  Rodney Scott is a 66 y.o. year old very pleasant male patient who presents for/with See problem oriented charting ROS- No chest pain or shortness of breath. No headache or blurry vision. Edema slightly worse as not wearing compression stockings.    Past Medical History-  Patient Active Problem List   Diagnosis Date Noted  . Former smoker 07/26/2014    Priority: High  . Factor V Leiden ([place]) 07/31/2007    Priority: High  . DVT, HX OF 07/31/2007    Priority: High  . Hyperglycemia 12/05/2016    Priority: Medium  . Short-term memory loss 06/15/2015    Priority: Medium  . Essential hypertension, benign 07/26/2014    Priority: Medium  . DEPRESSION, MAJOR, RECURRENT, MODERATE 03/24/2009    Priority: Medium  . Hyperlipidemia 07/31/2007    Priority: Medium  . 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 Osteoarthritis 07/15/2008    Priority: Low  . Venous (peripheral) insufficiency 12/11/2007    Priority: Low  . Allergic rhinitis 07/31/2007    Priority: Low  . History of colonic polyps 07/31/2007    Priority: Low  . Left inguinal hernia 12/05/2016    Medications- reviewed and updated Current Outpatient Prescriptions  Medication Sig Dispense Refill  . benazepril (LOTENSIN) 20 MG tablet TAKE ONE TABLET BY MOUTH ONCE DAILY 90 tablet 2  . buPROPion (WELLBUTRIN XL) 300 MG 24 hr tablet TAKE ONE TABLET BY MOUTH ONCE DAILY 90 tablet 3  . desvenlafaxine (PRISTIQ) 50 MG 24 hr tablet Take 1 tablet (50 mg total) by mouth daily. 90 tablet 3  . doxycycline (PERIOSTAT) 20 MG tablet Reported on 12/15/2015    . rivaroxaban (XARELTO) 20 MG TABS tablet Take 1 tablet (20 mg total) by mouth daily. 90 tablet 3  . rosuvastatin (CRESTOR) 20 MG tablet TAKE ONE TABLET BY MOUTH ONCE DAILY 90 tablet 3   No current facility-administered medications for this visit.     Objective: BP 124/82 (BP Location: Left Arm, Patient Position: Sitting, Cuff Size: Large)   Pulse 69    Temp 98.1 F (36.7 C) (Oral)   Ht 6\' 3"  (1.905 m)   Wt 237 lb 3.2 oz (107.6 kg)   SpO2 97%   BMI 29.65 kg/m  Gen: NAD, resting comfortably CV: RRR no murmurs rubs or gallops Lungs: CTAB no crackles, wheeze, rhonchi Abdomen: soft/nontender/nondistended. overweight Ext: trace edema on right, 1+ on left- chronic issue Skin: warm, dry, some venous stasis changes on legs  Assessment/Plan:  Hernia not bothering him  Weight stable- did not cut alcohol or reduce portion size-encouraged at least mild weight loss.   Not wearing compression stockings lately  Hyperlipidemia S: well controlled on crestor 20mg  daily with LDL 72 on last check. No myalgias.  A/P: continue current medication  Essential hypertension, benign S: controlled on benazepril 20mg .  Last visit had taken sudafed which we advised against BP Readings from Last 3 Encounters:  06/05/17 124/82  12/05/16 (!) 150/92  05/03/16 118/78  A/P: We discussed blood pressure goal of <140/90. Continue current meds  DEPRESSION, MAJOR, RECURRENT, MODERATE S: compliant with wellbutrin. We had stopped remeron for sleep. He stopped taking pristiq- states 30 day supply was hard for him. No SI but slightly worsened depression A/P: restart pristiq (90 days)with 3 month follow up. Continue wellbutrin.   Factor V Leiden Compliant with xarelto 20mg  daily- no signs of recurrence.   Prevnar 13 today with pneumovax 23 in a year Return in about  3 months (around 09/05/2017) for Welcome to medicare (30 minute visit).  Meds ordered this encounter  Medications  . rivaroxaban (XARELTO) 20 MG TABS tablet    Sig: Take 1 tablet (20 mg total) by mouth daily.    Dispense:  90 tablet    Refill:  3  . desvenlafaxine (PRISTIQ) 50 MG 24 hr tablet    Sig: Take 1 tablet (50 mg total) by mouth daily.    Dispense:  90 tablet    Refill:  3    Return precautions advised.  [name], MD

## 2017-06-05 NOTE — Assessment & Plan Note (Signed)
S: compliant with wellbutrin. We had stopped remeron for sleep. He stopped taking pristiq- states 30 day supply was hard for him. No SI but slightly worsened depression A/P: restart pristiq (90 days)with 3 month follow up. Continue wellbutrin.

## 2017-06-05 NOTE — Assessment & Plan Note (Signed)
Compliant with xarelto 20mg  daily- no signs of recurrence.

## 2017-06-05 NOTE — Patient Instructions (Addendum)
We will call you within a week or two about your referral to aneurysm screening. If you do not hear within 3 weeks, give [place] a call.   Prevnar 13 today  Restart pristiq- let us know immediately if any thoughts of harming yourself  ______________________________________________________________________  Starting October 1st 2018, I will be transferring to our new location:  I would love to have you remain my patient at this new location as long as it remains convenient for you. I am excited about the opportunity to have x-ray and sports medicine in the new building but will really miss the awesome staff and physicians at [facility]. Continue to schedule appointments at [facility] and we will automatically transfer them to the [place] location starting October 1st.

## 2017-06-05 NOTE — Assessment & Plan Note (Signed)
S: well controlled on crestor 20mg  daily with LDL 72 on last check. No myalgias.  A/P: continue current medication

## 2017-06-05 NOTE — Assessment & Plan Note (Signed)
S: controlled on benazepril 20mg .  Last visit had taken sudafed which we advised against BP Readings from Last 3 Encounters:  06/05/17 124/82  12/05/16 (!) 150/92  05/03/16 118/78  A/P: We discussed blood pressure goal of <140/90. Continue current meds

## 2017-06-05 NOTE — Addendum Note (Signed)
Addended by: Lyndle Herrlich on: 06/05/2017 09:13 AM   Modules accepted: Orders

## 2017-06-17 ENCOUNTER — Ambulatory Visit (HOSPITAL_COMMUNITY)
Admission: RE | Admit: 2017-06-17 | Discharge: 2017-06-17 | Disposition: A | Discharge status: Home / Self Care | Payer: Medicare Other | Source: Ambulatory Visit | Attending: Cardiovascular Disease | Admitting: Cardiovascular Disease

## 2017-06-17 DIAGNOSIS — Z87891 Personal history of nicotine dependence: Secondary | ICD-10-CM | POA: Insufficient documentation

## 2017-06-17 DIAGNOSIS — I10 Essential (primary) hypertension: Secondary | ICD-10-CM | POA: Diagnosis not present

## 2017-06-17 DIAGNOSIS — Z136 Encounter for screening for cardiovascular disorders: Secondary | ICD-10-CM | POA: Diagnosis not present

## 2017-07-20 ENCOUNTER — Other Ambulatory Visit: Payer: Self-pay | Admitting: Family Medicine

## 2017-09-08 ENCOUNTER — Ambulatory Visit: Payer: Medicare Other | Admitting: Family Medicine

## 2017-09-08 ENCOUNTER — Encounter: Payer: Self-pay | Admitting: Family Medicine

## 2017-09-08 VITALS — BP 118/86 | HR 88 | Temp 99.0°F | Ht 75.0 in | Wt 231.0 lb

## 2017-09-08 DIAGNOSIS — Z0000 Encounter for general adult medical examination without abnormal findings: Secondary | ICD-10-CM | POA: Diagnosis not present

## 2017-09-08 DIAGNOSIS — Z86718 Personal history of other venous thrombosis and embolism: Secondary | ICD-10-CM | POA: Diagnosis not present

## 2017-09-08 DIAGNOSIS — E785 Hyperlipidemia, unspecified: Secondary | ICD-10-CM

## 2017-09-08 DIAGNOSIS — Z87891 Personal history of nicotine dependence: Secondary | ICD-10-CM | POA: Diagnosis not present

## 2017-09-08 DIAGNOSIS — Z23 Encounter for immunization: Secondary | ICD-10-CM

## 2017-09-08 DIAGNOSIS — D6851 Activated protein C resistance: Secondary | ICD-10-CM

## 2017-09-08 DIAGNOSIS — F331 Major depressive disorder, recurrent, moderate: Secondary | ICD-10-CM

## 2017-09-08 DIAGNOSIS — R739 Hyperglycemia, unspecified: Secondary | ICD-10-CM

## 2017-09-08 DIAGNOSIS — I10 Essential (primary) hypertension: Secondary | ICD-10-CM

## 2017-09-08 DIAGNOSIS — R413 Other amnesia: Secondary | ICD-10-CM | POA: Diagnosis not present

## 2017-09-08 LAB — COMPREHENSIVE METABOLIC PANEL
ALBUMIN: 4 g/dL (ref 3.5–5.2)
ALT: 21 U/L (ref 0–53)
AST: 17 U/L (ref 0–37)
Alkaline Phosphatase: 55 U/L (ref 39–117)
BUN: 17 mg/dL (ref 6–23)
CALCIUM: 9.8 mg/dL (ref 8.4–10.5)
CHLORIDE: 105 meq/L (ref 96–112)
CO2: 29 meq/L (ref 19–32)
CREATININE: 1.02 mg/dL (ref 0.40–1.50)
GFR: 77.74 mL/min (ref >60.00)
Glucose, Bld: 94 mg/dL (ref 70–99)
Potassium: 4.8 mEq/L (ref 3.5–5.1)
Sodium: 141 mEq/L (ref 135–145)
Total Bilirubin: 0.4 mg/dL (ref 0.2–1.2)
Total Protein: 6.7 g/dL (ref 6.0–8.3)

## 2017-09-08 LAB — POC URINALSYSI DIPSTICK (AUTOMATED)
Bilirubin, UA: NEGATIVE
Glucose, UA: NEGATIVE
KETONES UA: NEGATIVE
Leukocytes, UA: NEGATIVE
Nitrite, UA: NEGATIVE
PH UA: 7 (ref 5.0–8.0)
PROTEIN UA: NEGATIVE
RBC UA: NEGATIVE
SPEC GRAV UA: 1.015 (ref 1.010–1.025)
Urobilinogen, UA: 0.2 E.U./dL

## 2017-09-08 LAB — CBC
HEMATOCRIT: 47.6 % (ref 39.0–52.0)
Hemoglobin: 16 g/dL (ref 13.0–17.0)
MCHC: 33.6 g/dL (ref 30.0–36.0)
MCV: 98 fl (ref 78.0–100.0)
PLATELETS: 202 10*3/uL (ref 150.0–400.0)
RBC: 4.85 Mil/uL (ref 4.22–5.81)
RDW: 13.1 % (ref 11.5–15.5)
WBC: 8.7 10*3/uL (ref 4.0–10.5)

## 2017-09-08 LAB — HEMOGLOBIN A1C: Hgb A1c MFr Bld: 5.8 % (ref 4.6–6.5)

## 2017-09-08 NOTE — Addendum Note (Signed)
Addended by: Frutoso Chase A on: 09/08/2017 11:59 AM   Modules accepted: Orders

## 2017-09-08 NOTE — Patient Instructions (Addendum)
  Rodney Scott , Thank you for taking time to come for your Medicare Wellness Visit. I appreciate your ongoing commitment to your health goals. Please review the following plan we discussed and let me know if I can assist you in the future.   These are the goals we discussed: 1. Flu shot today 2. Please stop by lab before you go 3. Goal 150 minutes a week exercise outside of work   This is a list of the screening recommended for you and due dates:  Health Maintenance  Topic Date Due  . Flu Shot  05/14/2017  .  Hepatitis C: One time screening is recommended by Center for Disease Control  (CDC) for  adults born from [birth year] through 1964.   11/11/2109*  . Colon Cancer Screening  02/12/2018  . Pneumonia vaccines (2 of 2 - PPSV23) 06/05/2018  . Tetanus Vaccine  01/02/2022  . HIV Screening  Completed  *Topic was postponed. The date shown is not the original due date.

## 2017-09-08 NOTE — Addendum Note (Signed)
Addended by: Mariam Dollar, Roselyn Reef M on: 09/08/2017 11:55 AM   Modules accepted: Orders

## 2017-09-08 NOTE — Progress Notes (Signed)
Phone: [phone]  Subjective:  Patient presents today for his welcome to medicare visit  Preventive Screening-Counseling & Management  Vision screen:   Visual Acuity Screening   Right eye Left eye Both eyes  Without correction:     With correction: 20/20 20/20 20/20    Advanced directives: none on file, would want full code- but would want good quality of life and would not want prolonged resuscitation or to be supported by ventilator for more than short period. Wife does not have HCPOA- discussed considering this and discussing with lawyer.   Smoking Status: former Smoker- see former smoking section in problem oriented charting Second Hand Smoking status: No smokers in home  Risk Factors Regular exercise: very active at work. Advised 150 minutes exercise outside of work Diet: has lost 6 lbs since last visit.  Improved diet- cutting down on portion size.  Wt Readings from Last 3 Encounters:  09/08/17 231 lb (104.8 kg)  06/05/17 237 lb 3.2 oz (107.6 kg)  12/05/16 237 lb (107.5 kg)  Fall Risk: None  Fall Risk  09/08/2017 06/05/2017  Falls in the past year? No No   Opioid use history:  no long term opioids use  Cardiac risk factors:  advanced age (older than 47 for men, 47 for women)  Hyperlipidemia : LDL reasonably controlled on crestor 20mg  Lab Results  Component Value Date   CHOL 151 11/28/2016   HDL 58.70 11/28/2016   LDLCALC 72 11/28/2016   LDLDIRECT 87.0 11/10/2015   TRIG 98.0 11/28/2016   CHOLHDL 3 11/28/2016  No diabetes.  Family History: denies Hypertension controlled   Depression Screen None. PHQ2 0  Depression screen [facility] 2/9 09/08/2017 06/05/2017  Decreased Interest 0 0  Down, Depressed, Hopeless 0 0  PHQ - 2 Score 0 0   Activities of Daily Living Independent ADLs and IADLs   Hearing Difficulties: patient declines, some tinnitus non pulsatile   Cognitive Testing  Reports some issues. MMSE still scores 29/30. HS graduation.   List the  Names of Other Physician/Practitioners you currently use: -[facility]  Immunization History  Administered Date(s) Administered  . Influenza Split 07/26/2011, 08/07/2012  . Influenza Whole 07/31/2007, 07/15/2008, 07/07/2009, 06/29/2010  . Influenza,inj,Quad PF,6+ Mos 08/20/2013, 07/26/2014, 06/15/2015, 07/05/2016  . Pneumococcal Conjugate-13 06/05/2017  . Pneumococcal Polysaccharide-23 06/22/2010  . Td 10/14/2001  . Tdap 01/03/2012   Required Immunizations needed today discussed shingrix availiability issues and getting at pharmacy. Flu shot today   Screening tests- up to date 1. Colon cancer screening- 02/2015 with 3 year polyp due to adeoma 2. Prostate cancer screening- PSA checked earlier this year- will continue on January checks. Low risk trend.  Lab Results  Component Value Date   PSA 0.39 11/28/2016   PSA 0.35 11/10/2015   PSA 0.44 10/28/2014  3. Lung cancer screening- smoked from late 60s to 2016- smoked about a pack a day so over 30 pack years. Would qualify- he declines for now- will reconsider next year.   ROS- No pertinent positives discovered in course of AWV ROS pertinent to problem oriented charting- No chest pain or shortness of breath. No headache or blurry vision. No edema   The following were reviewed and entered/updated in epic: Past Medical History:  Diagnosis Date  . Allergic rhinitis   . Colon polyps   . DVT (deep venous thrombosis) ([place])   . Factor V deficiency ([place])   . Hyperlipidemia   . Internal hemorrhoid 11/24/2009   No bleeding. Patient states thought was external.     .  Osteoarthritis   . Right rotator cuff tear 12/07/2010   S/p surgery.     Patient Active Problem List   Diagnosis Date Noted  . Former smoker 07/26/2014    Priority: High  . 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 DVT, HX OF 07/31/2007    Priority: High  . Hyperglycemia 12/05/2016    Priority: Medium  . Short-term memory loss 06/15/2015    Priority:  Medium  . Essential hypertension, benign 07/26/2014    Priority: Medium  . DEPRESSION, MAJOR, RECURRENT, MODERATE 03/24/2009    Priority: Medium  . Hyperlipidemia 07/31/2007    Priority: Medium  . 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 Osteoarthritis 07/15/2008    Priority: Low  . Venous (peripheral) insufficiency 12/11/2007    Priority: Low  . Allergic rhinitis 07/31/2007    Priority: Low  . History of colonic polyps 07/31/2007    Priority: Low  . Left inguinal hernia 12/05/2016   Past Surgical History:  Procedure Laterality Date  . right rotator cuff repair    . SALIVARY STONE REMOVAL      Family History  Problem Relation Age of Onset  . Factor V Leiden deficiency Son   . Colon cancer Father     Medications- reviewed and updated Current Outpatient Medications  Medication Sig Dispense Refill  . benazepril (LOTENSIN) 20 MG tablet TAKE ONE TABLET BY MOUTH ONCE DAILY 90 tablet 2  . buPROPion (WELLBUTRIN XL) 300 MG 24 hr tablet TAKE ONE TABLET BY MOUTH ONCE DAILY 90 tablet 3  . desvenlafaxine (PRISTIQ) 50 MG 24 hr tablet Take 1 tablet (50 mg total) by mouth daily. 90 tablet 3  . doxycycline (PERIOSTAT) 20 MG tablet Reported on 12/15/2015    . rivaroxaban (XARELTO) 20 MG TABS tablet Take 1 tablet (20 mg total) by mouth daily. 90 tablet 3  . rosuvastatin (CRESTOR) 20 MG tablet TAKE ONE TABLET BY MOUTH ONCE DAILY 90 tablet 3   No current facility-administered medications for this visit.     Allergies-reviewed and updated Allergies  Allergen Reactions  . Yellow Jacket Venom [Bee Venom] Shortness Of Breath    Had swelling localized, chest tightness, and shortness of breath  . Cephalexin     REACTION: solar rash    Social History   Socioeconomic History  . Marital status: Married    Spouse name: None  . Number of children: None  . Years of education: None  . Highest education level: None  Social Needs  . Financial resource strain: None  .  Food insecurity - worry: None  . Food insecurity - inability: None  . Transportation needs - medical: None  . Transportation needs - non-medical: None  Occupational History  . None  Tobacco Use  . Smoking status: Former Smoker    Packs/day: 1.00    Years: 45.00    Pack years: 45.00    Last attempt to quit: 11/06/2014    Years since quitting: 2.8  . Smokeless tobacco: Never Used  Substance and Sexual Activity  . Alcohol use: Yes    Alcohol/week: 12.0 - 13.2 oz    Types: 14 Cans of beer, 6 - 8 Standard drinks or equivalent per week  . Drug use: No  . Sexual activity: Yes  Other Topics Concern  . None  Social History Narrative   Married (wife patient of Dr. Yong Channel). Son and daughter. 1 granddaughter.       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 Oropharynx normal Neck: no thyromegaly CV: RRR no murmurs rubs or gallops Lungs: CTAB no crackles, wheeze, rhonchi Abdomen: soft/nontender/nondistended/normal bowel sounds. No rebound or guarding.  Ext: no edema Skin: warm, dry Neuro: grossly normal, moves all extremities, PERRLA Rectal: deferred for CPE  EKG: sinus rhythm with rate of 71. Normal axis, normal intervals, no hypertrophy, no st or t wave changes. Reassuring EKG  Assessment/Plan:  Welcome to medicare completed- discussed recommended screenings anddocumented any personalized health advice and referrals for preventive counseling. See AVS as well which was given to patient.   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 Still wears stockings at times.     Hyperglycemia- last a1c not elevated but some cbgs have been- update a1c Lab Results  Component Value Date   HGBA1C 5.5 01/16/2007   HTN- at goal on ace-I benazepril 20mg    HLD- at goal LDL <100 on crestor 10mg  daily  Given HTN, HLD, - update EKG  Former smoker Remains cigarette free since 2016. 45 pack years - get UA - AAA screen done in September 2018 - hold off on lung cancer screening per patient request  Short-term memory loss  Reports some issues. MMSE still scores 29/30. HS graduation.   DEPRESSION, MAJOR, RECURRENT, MODERATE Depression- major recurrent, moderate- well controlled on pristiq 50mg , wellbutrin 300mg  XL with remeron for sleep in past- will not refill as has done well without. denies anheonia or depressed mood. NO SI  Return in about 6 months (around 03/08/2018) for physical.   Orders Placed This Encounter  Procedures  . LDL cholesterol, direct    [place]  . Hemoglobin A1c    [place]  . CBC    [place]  . Comprehensive metabolic panel    [place]  . POCT Urinalysis Dipstick (Automated)    Standing Status:   Future    Standing Expiration Date:   10/08/2017  . EKG 12-Lead   Return precautions advised. [name], MD

## 2017-09-08 NOTE — Assessment & Plan Note (Signed)
Reports some issues. MMSE still scores 29/30. HS graduation.

## 2017-09-08 NOTE — Assessment & Plan Note (Addendum)
Remains cigarette free since 2016. 45 pack years - get UA - AAA screen done in September 2018 - hold off on lung cancer screening per patient request

## 2017-09-08 NOTE — Assessment & Plan Note (Signed)
Depression- major recurrent, moderate- well controlled on pristiq 50mg , wellbutrin 300mg  XL with remeron for sleep in past- will not refill as has done well without. denies anheonia or depressed mood. NO SI

## 2017-09-09 LAB — LDL CHOLESTEROL, DIRECT: LDL DIRECT: 102 mg/dL

## 2017-09-12 ENCOUNTER — Other Ambulatory Visit: Payer: Self-pay | Admitting: *Deleted

## 2017-09-12 MED ORDER — ROSUVASTATIN CALCIUM 40 MG PO TABS: 40.0000 mg | ORAL_TABLET | Freq: Every day | ORAL | 3 refills | Status: DC

## 2017-10-13 ENCOUNTER — Other Ambulatory Visit: Payer: Self-pay | Admitting: Family Medicine

## 2018-03-10 ENCOUNTER — Encounter: Payer: Self-pay | Admitting: Family Medicine

## 2018-03-10 ENCOUNTER — Ambulatory Visit (INDEPENDENT_AMBULATORY_CARE_PROVIDER_SITE_OTHER): Payer: Medicare Other | Admitting: Family Medicine

## 2018-03-10 VITALS — BP 122/80 | HR 73 | Temp 98.6°F | Ht 75.0 in | Wt 232.8 lb

## 2018-03-10 DIAGNOSIS — F3342 Major depressive disorder, recurrent, in full remission: Secondary | ICD-10-CM

## 2018-03-10 DIAGNOSIS — Z86718 Personal history of other venous thrombosis and embolism: Secondary | ICD-10-CM | POA: Diagnosis not present

## 2018-03-10 DIAGNOSIS — Z0000 Encounter for general adult medical examination without abnormal findings: Secondary | ICD-10-CM | POA: Diagnosis not present

## 2018-03-10 DIAGNOSIS — N401 Enlarged prostate with lower urinary tract symptoms: Secondary | ICD-10-CM

## 2018-03-10 DIAGNOSIS — Z1211 Encounter for screening for malignant neoplasm of colon: Secondary | ICD-10-CM | POA: Diagnosis not present

## 2018-03-10 DIAGNOSIS — D6851 Activated protein C resistance: Secondary | ICD-10-CM

## 2018-03-10 DIAGNOSIS — R351 Nocturia: Secondary | ICD-10-CM

## 2018-03-10 DIAGNOSIS — E785 Hyperlipidemia, unspecified: Secondary | ICD-10-CM

## 2018-03-10 DIAGNOSIS — R739 Hyperglycemia, unspecified: Secondary | ICD-10-CM

## 2018-03-10 HISTORY — DX: Benign prostatic hyperplasia with lower urinary tract symptoms: N40.1

## 2018-03-10 NOTE — Progress Notes (Signed)
Phone: [phone]  Subjective:  Patient presents today for their annual physical. Chief complaint-noted.   See problem oriented charting- ROS- full  review of systems was completed and negative except for: tinnitus, easy bruising  The following were reviewed and entered/updated in epic: Past Medical History:  Diagnosis Date  . Allergic rhinitis   . Colon polyps   . DVT (deep venous thrombosis) ([place])   . Factor V deficiency ([place])   . Hyperlipidemia   . Internal hemorrhoid 11/24/2009   No bleeding. Patient states thought was external.     . Osteoarthritis   . Right rotator cuff tear 12/07/2010   S/p surgery.     Patient Active Problem List   Diagnosis Date Noted  . Former smoker 07/26/2014    Priority: High  . 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 DVT, HX OF 07/31/2007    Priority: High  . Hyperglycemia 12/05/2016    Priority: Medium  . Short-term memory loss 06/15/2015    Priority: Medium  . Essential hypertension, benign 07/26/2014    Priority: Medium  . Depression, major, recurrent, in complete remission ([employer]) 03/24/2009    Priority: Medium  . Hyperlipidemia 07/31/2007    Priority: Medium  . 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 Osteoarthritis 07/15/2008    Priority: Low  . Venous (peripheral) insufficiency 12/11/2007    Priority: Low  . Allergic rhinitis 07/31/2007    Priority: Low  . History of colonic polyps 07/31/2007    Priority: Low  . BPH associated with nocturia 03/10/2018  . Left inguinal hernia 12/05/2016   Past Surgical History:  Procedure Laterality Date  . right rotator cuff repair    . SALIVARY STONE REMOVAL      Family History  Problem Relation Age of Onset  . Colon cancer Father   . Factor V Leiden deficiency Son     Medications- reviewed and updated Current Outpatient Medications  Medication Sig Dispense Refill  . benazepril (LOTENSIN) 20 MG tablet TAKE ONE TABLET BY MOUTH ONCE DAILY 90  tablet 2  . buPROPion (WELLBUTRIN XL) 300 MG 24 hr tablet TAKE ONE TABLET BY MOUTH ONCE DAILY 90 tablet 3  . desvenlafaxine (PRISTIQ) 50 MG 24 hr tablet Take 1 tablet (50 mg total) by mouth daily. 90 tablet 3  . doxycycline (PERIOSTAT) 20 MG tablet Reported on 12/15/2015    . rivaroxaban (XARELTO) 20 MG TABS tablet Take 1 tablet (20 mg total) by mouth daily. 90 tablet 3  . rosuvastatin (CRESTOR) 40 MG tablet Take 1 tablet (40 mg total) by mouth daily. 90 tablet 3   No current facility-administered medications for this visit.     Allergies-reviewed and updated Allergies  Allergen Reactions  . Yellow Jacket Venom [Bee Venom] Shortness Of Breath    Had swelling localized, chest tightness, and shortness of breath  . Cephalexin     REACTION: solar rash    Social History   Social History Narrative   Married (wife patient of Dr. Yong Channel). Son and daughter. 1 granddaughter.       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 Oropharynx normal Neck: no thyromegaly CV: RRR no murmurs rubs or gallops Lungs: CTAB no crackles, wheeze, rhonchi Abdomen: soft/nontender/nondistended/normal bowel sounds. No rebound or guarding.  Ext: no edema Skin: warm, dry Neuro: grossly normal, moves all extremities, PERRLA Rectal: normal tone, mild diffusely enlarged prostate, no masses or tenderness   Assessment/Plan:  67 y.o. male presenting for annual physical.  Health Maintenance counseling: 1. Anticipatory guidance: Patient counseled regarding regular dental  exams -q6 months, eye exams -yearly or every other year advised (has been a few years), wearing seatbelts.  2. Risk factor reduction:  Advised patient of need for regular exercise and diet rich and fruits and vegetables to reduce risk of heart attack and stroke. Exercise- active with work and yardwork- advised 150 minutes a week outside of work and Haematologist. Diet-down 5 lbs since august- not particularly trying- advised healthy diet as above.  Wt Readings from Last 3 Encounters:  03/10/18 232 lb 12.8 oz (105.6 kg)  09/08/17 231 lb (104.8 kg)  06/05/17 237 lb 3.2 oz (107.6 kg)  3. Immunizations/screenings/ancillary studies- We discussed shingrix availability issues  as well as coverage issues (part D medicare)- I recommended that patient get vaccine at the pharmacy  Immunization History  Administered Date(s) Administered  . Influenza Split 07/26/2011, 08/07/2012  . Influenza Whole 07/31/2007, 07/15/2008, 07/07/2009, 06/29/2010  . Influenza, High Dose Seasonal PF 09/08/2017  . Influenza,inj,Quad PF,6+ Mos 08/20/2013, 07/26/2014, 06/15/2015, 07/05/2016  . Pneumococcal Conjugate-13 06/05/2017  . Pneumococcal Polysaccharide-23 06/22/2010  . Td 10/14/2001  . Tdap 01/03/2012  4. Prostate cancer screening-  mild BPH in past. Nocturia 1-2x a night. Will update PSA through age 40 Lab Results  Component Value Date   PSA 0.39 11/28/2016   PSA 0.35 11/10/2015   PSA 0.44 10/28/2014   5. Colon cancer screening - 02/2015 with 3 year repeat per Dr. Fuller Plan- referred today 6. Skin cancer screening- no dermatologist- advised regular sunscreen use. Denies worrisome, changing, or new skin lesions.  7. Former smoker. 45 pack years. Quit smoking 2016.  - AAA screen sept 2018 - UA last visit - he has declined lung cancer screening  Status of chronic or acute concerns  Remains on xarelto for Factor V Leiden, hx DVT/PE  Hyperglycemia- last a1c slightly high- offered repeat  HTN- controlled on benazepril  20mg   HLD- controlled with LDL under 100 on crestor 10mg  daily  Has complained of memory loss in past. MMSE 29/30 last visit. [facility]heck at [facility] each year. He states about the same  Depression, major, recurrent, in complete remission ([place]) Depression major moderate recurrent- controlled on pristiq 50mg , wellbutrin 300mg . In past was on remeron for sleep- not needing now. PHQ9 of 1 today so good control   Return in about 6 months (around 09/10/2018) for annual wellness visit.  Lab/Order associations: Preventative health care - Plan: CBC, Comprehensive metabolic panel, Lipid panel, Hemoglobin A1c, PSA  Factor V Leiden (HCC)  Depression, major, recurrent, in complete remission (HCC)  Hyperglycemia - Plan: Hemoglobin A1c  Hyperlipidemia, unspecified hyperlipidemia type - Plan: CBC, Comprehensive metabolic panel, Lipid panel  BPH associated with nocturia - Plan: PSA  Screening for colon cancer - Plan: Ambulatory referral to Gastroenterology  Return precautions advised.  [name], MD

## 2018-03-10 NOTE — Assessment & Plan Note (Signed)
Depression major moderate recurrent- controlled on pristiq 50mg , wellbutrin 300mg . In past was on remeron for sleep- not needing now. PHQ9 of 1 today so good control

## 2018-03-10 NOTE — Patient Instructions (Addendum)
Health Maintenance Due  Topic Date Due  . COLONOSCOPY -referral placed 02/12/2018   Please check with your pharmacy to see if they have the shingrix vaccine. If they do- please get this immunization and update [place] by phone call or mychart with dates you receive the vaccine  We will call you within a week or two about your referral to [facility] for updated colonoscopy. If you do not hear within 3 weeks, give [place] a call.   Schedule a lab visit at the check out desk within 2 weeks. Return for future fasting labs meaning nothing but water after midnight please. Ok to take your medications with water.   Also schedule a wellness visit with our nurses in 6 months (they can recheck memory). Id be happy to see you that day as well if you would like  Go ahead and schedule a physical for a year as well

## 2018-03-14 ENCOUNTER — Other Ambulatory Visit: Payer: Self-pay | Admitting: Family Medicine

## 2018-03-20 ENCOUNTER — Other Ambulatory Visit (INDEPENDENT_AMBULATORY_CARE_PROVIDER_SITE_OTHER): Payer: Medicare Other

## 2018-03-20 DIAGNOSIS — N401 Enlarged prostate with lower urinary tract symptoms: Secondary | ICD-10-CM | POA: Diagnosis not present

## 2018-03-20 DIAGNOSIS — E785 Hyperlipidemia, unspecified: Secondary | ICD-10-CM

## 2018-03-20 DIAGNOSIS — R739 Hyperglycemia, unspecified: Secondary | ICD-10-CM

## 2018-03-20 DIAGNOSIS — R351 Nocturia: Secondary | ICD-10-CM | POA: Diagnosis not present

## 2018-03-20 DIAGNOSIS — Z0000 Encounter for general adult medical examination without abnormal findings: Secondary | ICD-10-CM

## 2018-03-20 LAB — LIPID PANEL
Cholesterol: 155 mg/dL (ref 0–200)
HDL: 56.3 mg/dL (ref >39.00)
LDL Cholesterol: 78 mg/dL (ref 0–99)
NONHDL: 98.38
Total CHOL/HDL Ratio: 3
Triglycerides: 100 mg/dL (ref 0.0–149.0)
VLDL: 20 mg/dL (ref 0.0–40.0)

## 2018-03-20 LAB — COMPREHENSIVE METABOLIC PANEL
ALBUMIN: 3.8 g/dL (ref 3.5–5.2)
ALT: 19 U/L (ref 0–53)
AST: 16 U/L (ref 0–37)
Alkaline Phosphatase: 49 U/L (ref 39–117)
BUN: 18 mg/dL (ref 6–23)
CO2: 29 mEq/L (ref 19–32)
CREATININE: 1.03 mg/dL (ref 0.40–1.50)
Calcium: 9.4 mg/dL (ref 8.4–10.5)
Chloride: 104 mEq/L (ref 96–112)
GFR: 76.74 mL/min (ref >60.00)
GLUCOSE: 93 mg/dL (ref 70–99)
POTASSIUM: 5.3 meq/L — AB (ref 3.5–5.1)
SODIUM: 140 meq/L (ref 135–145)
TOTAL PROTEIN: 5.9 g/dL — AB (ref 6.0–8.3)
Total Bilirubin: 0.5 mg/dL (ref 0.2–1.2)

## 2018-03-20 LAB — CBC
HCT: 44.9 % (ref 39.0–52.0)
Hemoglobin: 15.3 g/dL (ref 13.0–17.0)
MCHC: 34.1 g/dL (ref 30.0–36.0)
MCV: 96.6 fl (ref 78.0–100.0)
Platelets: 210 10*3/uL (ref 150.0–400.0)
RBC: 4.64 Mil/uL (ref 4.22–5.81)
RDW: 13.4 % (ref 11.5–15.5)
WBC: 7.2 10*3/uL (ref 4.0–10.5)

## 2018-03-20 LAB — PSA: PSA: 0.4 ng/mL (ref 0.10–4.00)

## 2018-03-20 LAB — HEMOGLOBIN A1C: HEMOGLOBIN A1C: 5.8 % (ref 4.6–6.5)

## 2018-03-20 NOTE — Progress Notes (Signed)
Your CBC was normal (blood counts, infection fighting cells, platelets). Your CMET was  Largely normal (kidney, liver, and electrolytes, blood sugar). Team- please set up repeat bmet within a week under hyperkalemia- may have to reduce his BP medicine if remains high on recheck.  Your cholesterol looks good on crestor PSA is low risk for prostate cancer You remain At risk for diabetes with hemoglobin a1c of 5.8 (at risk from 5.7-6.4). Healthy eating, regular exercise, weight loss advised. Above 6 we could consider metformin to help prevent diabetes.

## 2018-03-23 ENCOUNTER — Other Ambulatory Visit: Payer: Self-pay

## 2018-03-23 DIAGNOSIS — E875 Hyperkalemia: Secondary | ICD-10-CM

## 2018-04-03 ENCOUNTER — Other Ambulatory Visit: Payer: Medicare Other

## 2018-04-10 ENCOUNTER — Other Ambulatory Visit (INDEPENDENT_AMBULATORY_CARE_PROVIDER_SITE_OTHER): Payer: Medicare Other

## 2018-04-10 DIAGNOSIS — E875 Hyperkalemia: Secondary | ICD-10-CM

## 2018-04-10 LAB — BASIC METABOLIC PANEL
BUN: 15 mg/dL (ref 6–23)
CALCIUM: 9.3 mg/dL (ref 8.4–10.5)
CO2: 29 mEq/L (ref 19–32)
Chloride: 105 mEq/L (ref 96–112)
Creatinine, Ser: 0.91 mg/dL (ref 0.40–1.50)
GFR: 88.52 mL/min (ref >60.00)
GLUCOSE: 93 mg/dL (ref 70–99)
POTASSIUM: 4.7 meq/L (ref 3.5–5.1)
SODIUM: 140 meq/L (ref 135–145)

## 2018-06-02 ENCOUNTER — Telehealth: Payer: Self-pay

## 2018-06-02 ENCOUNTER — Encounter: Payer: Self-pay | Admitting: Gastroenterology

## 2018-06-02 ENCOUNTER — Ambulatory Visit: Payer: Medicare Other | Admitting: Gastroenterology

## 2018-06-02 VITALS — BP 94/54 | HR 102 | Ht 76.0 in | Wt 233.0 lb

## 2018-06-02 DIAGNOSIS — Z86010 Personal history of colonic polyps: Secondary | ICD-10-CM | POA: Diagnosis not present

## 2018-06-02 DIAGNOSIS — Z7901 Long term (current) use of anticoagulants: Secondary | ICD-10-CM | POA: Diagnosis not present

## 2018-06-02 NOTE — Patient Instructions (Signed)
It has been recommended to you by your physician that you have a(n) Colonoscopy completed. Per your request, we did not schedule the procedure(s) today. Please contact our office at [phone] when you decide to have the procedure completed.  Normal BMI (Body Mass Index- based on height and weight) is between 23 and 30. Your BMI today is Body mass index is 28.36 kg/m. Marland Kitchen Please consider follow up  regarding your BMI with your Primary Care Provider.  Thank you for choosing me and [facility].  Pricilla Riffle. Dagoberto Ligas., MD., [name] Regal

## 2018-06-02 NOTE — Telephone Encounter (Signed)
   DUWAN ADRIAN [birth year] [id number]  Dear Dr. Yong Channel:  We have scheduled the above named patient for a(n) Colonoscopy procedure. Our records show that (s)he is on anticoagulation therapy.  Please advise as to whether the patient may come off their therapy of Xarelto 2 days prior to the procedure.  Please route your response to Marlon Pel, [place]  Sincerely,    [facility]

## 2018-06-02 NOTE — Progress Notes (Signed)
History of Present Illness: This is a 67 year old male referred by Marin Olp, MD for the evaluation of adenomatous colon polyps on Xarelto.  He had adenomatous, hyperplastic and sessile serrated polyps on last colonoscopy as below.  He has no ongoing gastrointestinal complaints.  He is maintained on Xarelto. Denies weight loss, abdominal pain, constipation, diarrhea, change in stool caliber, melena, hematochezia, nausea, vomiting, dysphagia, reflux symptoms, chest pain.   Colonoscopy 02/2015 1. Three sessile polyps in the descending colon and transverse colon; polypectomies performed with a cold snare 2. Two sessile polyps in the transverse colon; polypectomies performed with cold forceps 3. Four sessile polyps in the sigmoid colon; polypectomies performed with a cold snare 4. Mild diverticulosis was noted in the sigmoid colon 5. Grade I internal hemorrhoids    Allergies  Allergen Reactions  . [place] Venom [Bee Venom] Shortness Of Breath    Had swelling localized, chest tightness, and shortness of breath  . Cephalexin     REACTION: solar rash   Outpatient Medications Prior to Visit  Medication Sig Dispense Refill  . benazepril (LOTENSIN) 20 MG tablet TAKE 1 TABLET BY MOUTH ONCE DAILY 90 tablet 2  . buPROPion (WELLBUTRIN XL) 300 MG 24 hr tablet TAKE ONE TABLET BY MOUTH ONCE DAILY 90 tablet 3  . desvenlafaxine (PRISTIQ) 50 MG 24 hr tablet Take 1 tablet (50 mg total) by mouth daily. 90 tablet 3  . doxycycline (PERIOSTAT) 20 MG tablet Reported on 12/15/2015    . Loratadine (CLARITIN) 10 MG CAPS Take by mouth daily.    . rivaroxaban (XARELTO) 20 MG TABS tablet Take 1 tablet (20 mg total) by mouth daily. 90 tablet 3  . rosuvastatin (CRESTOR) 40 MG tablet Take 1 tablet (40 mg total) by mouth daily. 90 tablet 3   No facility-administered medications prior to visit.    Past Medical History:  Diagnosis Date  . Allergic rhinitis   . DVT (deep venous thrombosis) ([place])   .  Factor V deficiency ([place])   . Hyperlipidemia   . Internal hemorrhoid 11/24/2009   No bleeding. Patient states thought was external.     . Osteoarthritis   . Right rotator cuff tear 12/07/2010   S/p surgery.    . Tubular adenoma of colon 11/2006   Past Surgical History:  Procedure Laterality Date  . right rotator cuff repair    . SALIVARY STONE REMOVAL     Social History   Socioeconomic History  . Marital status: Married    Spouse name: Not on file  . Number of children: Not on file  . Years of education: Not on file  . Highest education level: Not on file  Occupational History  . Not on file  Social Needs  . Financial resource strain: Not on file  . Food insecurity:    Worry: Not on file    Inability: Not on file  . Transportation needs:    Medical: Not on file    Non-medical: Not on file  Tobacco Use  . Smoking status: Current Some Day Smoker    Packs/day: 0.25    Years: 45.00    Pack years: 11.25    Last attempt to quit: 11/06/2014    Years since quitting: 3.5  . Smokeless tobacco: Never Used  Substance and Sexual Activity  . Alcohol use: Yes    Alcohol/week: 20.0 - 22.0 standard drinks    Types: 14 Cans of beer, 6 - 8 Standard drinks or equivalent per  week  . Drug use: No  . Sexual activity: Yes  Lifestyle  . Physical activity:    Days per week: Not on file    Minutes per session: Not on file  . Stress: Not on file  Relationships  . Social connections:    Talks on phone: Not on file    Gets together: Not on file    Attends religious service: Not on file    Active member of club or organization: Not on file    Attends meetings of clubs or organizations: Not on file    Relationship status: Not on file  Other Topics Concern  . Not on file  Social History Narrative   Married (wife patient of Dr. Yong Channel). Son and daughter. 1 granddaughter.       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 Colon cancer Father 11  . Factor V Leiden deficiency Son      Review of Systems: Pertinent positive and negative review of systems were noted in the above HPI section. All other review of systems were otherwise negative.    Physical Exam: General: Well developed, well nourished, no acute distress Head: Normocephalic and atraumatic Eyes:  sclerae anicteric, EOMI Ears: Normal auditory acuity Mouth: No deformity or lesions Neck: Supple, no masses or thyromegaly Lungs: Clear throughout to auscultation Heart: Regular rate and rhythm; no murmurs, rubs or bruits Abdomen: Soft, non tender and non distended. No masses, hepatosplenomegaly or hernias noted. Normal Bowel sounds Rectal: Deferred to colonoscopy Musculoskeletal: Symmetrical with no gross deformities  Skin: No lesions on visible extremities Pulses:  Normal pulses noted Extremities: No clubbing, cyanosis, edema or deformities noted Neurological: Alert oriented x 4, grossly nonfocal Cervical Nodes:  No significant cervical adenopathy Inguinal Nodes: No significant inguinal adenopathy Psychological:  Alert and cooperative. Normal mood and affect  Assessment and Recommendations:  1. Personal history of adenomatous colon polyps.  Schedule surveillance colonoscopy. The risks (including bleeding, perforation, infection, missed lesions, medication reactions and possible hospitalization or surgery if complications occur), benefits, and alternatives to colonoscopy with possible biopsy and possible polypectomy were discussed with the patient and they consent to proceed.   2. Hold Xarelto 2 days before procedure - will instruct when and how to resume after procedure. Low but real risk of cardiovascular event such as heart attack, stroke, embolism, thrombosis or ischemia/infarct of other  organs off Xarelto explained and need to seek urgent help if this occurs. The patient consents to proceed. Will communicate by phone or EMR with patient's prescribing provider to confirm that holding Xarelto is reasonable in this case.     cc: Marin Olp, [address] [place], [place] [phone]

## 2018-06-02 NOTE — Telephone Encounter (Signed)
Yes thanks may come off medication. Please monitor for signs and symptoms of DVT or PE given prior DVT history

## 2018-06-03 NOTE — Telephone Encounter (Signed)
Patient informed he can hold Xarelto 2 days prior to his procedure when he decides to schedule. Patient verbalized understanding and will call back to schedule nurse visit and Colonoscopy.

## 2018-06-19 ENCOUNTER — Ambulatory Visit: Payer: Medicare Other | Admitting: Family Medicine

## 2018-06-19 ENCOUNTER — Encounter: Payer: Self-pay | Admitting: Family Medicine

## 2018-06-19 VITALS — BP 114/68 | HR 79 | Temp 98.4°F | Ht 76.0 in | Wt 232.6 lb

## 2018-06-19 DIAGNOSIS — L03116 Cellulitis of left lower limb: Secondary | ICD-10-CM

## 2018-06-19 DIAGNOSIS — R413 Other amnesia: Secondary | ICD-10-CM | POA: Diagnosis not present

## 2018-06-19 MED ORDER — CEFTRIAXONE SODIUM 1 G IJ SOLR
1.0000 g | Freq: Once | INTRAMUSCULAR | Status: AC
Start: 2018-06-19 — End: 2018-06-19
  Administered 2018-06-19: 1 g via INTRAMUSCULAR

## 2018-06-19 MED ORDER — DOXYCYCLINE HYCLATE 100 MG PO TABS: 100.0000 mg | ORAL_TABLET | Freq: Two times a day (BID) | ORAL | 0 refills | Status: DC

## 2018-06-19 NOTE — Progress Notes (Signed)
   Subjective:  Rodney Scott is a 67 y.o. male who presents today for same-day appointment with a chief complaint of leg redness .   HPI:  Leg Redness, Acute problem Symptoms started 3 days ago.  Patient was doing work around his house when he slipped and fell on a pile bricks.  He had an abrasion to his left shin.  Over the next 2 days he has had worsening swelling and redness to his entire left leg.  No fever or chills.  No treatments tried.  He has been compliant with his Xarelto.  Symptoms seem to be worsening.  No obvious alleviating or aggravating factors.  Patient has history of allergy to keflex. Per his report this was more consistent with a sunburn while on keflex. Denies diffuse rash, itching, or facial swelling.   ROS: Per HPI  PMH: He reports that he has been smoking. He has a 11.25 pack-year smoking history. He has never used smokeless tobacco. He reports that he drinks about 20.0 - 22.0 standard drinks of alcohol per week. He reports that he does not use drugs.  Objective:  Physical Exam: BP 114/68 (BP Location: Left Arm, Patient Position: Sitting, Cuff Size: Normal)   Pulse 79   Temp 98.4 F (36.9 C) (Oral)   Ht 6\' 4"  (1.93 m)   Wt 232 lb 9.6 oz (105.5 kg)   SpO2 99%   BMI 28.31 kg/m   Gen: NAD, resting comfortably Skin: Approximately 10 cm superficial abrasion on left anterior shin with surrounding erythema and edema.  Distal pulses intact.  Warm to touch.  Homans sign negative.  Assessment/Plan:  Cellulitis Start doxycycline 100 mg twice daily for the next 10 days.  Will give 1 g of Rocephin today..  Patient is anticoagulated on Xarelto-very low likelihood for DVT.  He does not have any signs of systemic illness.  Discussed reasons to return to care.  Follow-up as needed.  Memory loss Patient inquired about medications to help with short-term memory loss.  I provided him with the name of Namenda and Aricept and encouraged him to follow-up with his PCP to discuss  further.  Algis Greenhouse. Jerline Pain, MD 06/19/2018 10:09 AM

## 2018-06-19 NOTE — Patient Instructions (Signed)
It was very nice to see you today!  Please start the doxycycline.  We will give you a shot of an antibiotic today. Please let me know if you have any reaction to this.  Please let me or Dr Yong Channel know if your symptoms do not improve over the next few days.  Please look into Namenda or Aricept for your memory loss and come back to discuss  Take care, Dr Jerline Pain

## 2018-06-23 ENCOUNTER — Encounter: Payer: Self-pay | Admitting: Family Medicine

## 2018-06-23 ENCOUNTER — Ambulatory Visit: Payer: Medicare Other | Admitting: Family Medicine

## 2018-06-23 DIAGNOSIS — E785 Hyperlipidemia, unspecified: Secondary | ICD-10-CM

## 2018-06-23 DIAGNOSIS — F3342 Major depressive disorder, recurrent, in full remission: Secondary | ICD-10-CM | POA: Diagnosis not present

## 2018-06-23 DIAGNOSIS — I10 Essential (primary) hypertension: Secondary | ICD-10-CM | POA: Diagnosis not present

## 2018-06-23 DIAGNOSIS — R413 Other amnesia: Secondary | ICD-10-CM

## 2018-06-23 DIAGNOSIS — D6851 Activated protein C resistance: Secondary | ICD-10-CM

## 2018-06-23 NOTE — Patient Instructions (Addendum)
Health Maintenance Due  Topic Date Due  . COLONOSCOPY -pt will call to schedule 02/12/2018  . INFLUENZA VACCINE -pt to call to schedule this in October/November 05/14/2018  . PNA vac Low Risk Adult (2 of 2 - PPSV23)-pt to have done at future visit (perhaps with flu shot) 06/05/2018   See [place] back if leg does not continue to improve or worsens or if you have fever, chills, vomiting  Please stop by lab before you go  We will call you within two weeks about your referral to neurology. If you do not hear within 3 weeks, give [place] a call.

## 2018-06-23 NOTE — Assessment & Plan Note (Addendum)
S: no issues with long term recall- recall short term or within lats few weeks seems to be getting worse. Wife has noted issues for a few years but has been more often lately. No head trauma. Progressive but gradual. Mother was forgetful from 45s on but never diagnosed with dementia. Forgetting things like school names/street names in working with the schools- these are newer locations for him in [place]. Rodney Scott snot gotten lost driving. google maps helps A/P: MMSE actually improved at 30/30- from 66/30 last November but having issues in day to day life with memory. will evaluate for reversible causes of dementia-phq9 (under 5),  CBC, CMET, RPR, HIV, TSH, B12. They request referral to neurology after conversation- I suspect mild cognitive impairment

## 2018-06-23 NOTE — Progress Notes (Signed)
Subjective:  Rodney Scott is a 67 y.o. year old very pleasant male patient who presents for/with See problem oriented charting ROS- reports short term memory issues. No chest pain or shortness of breath. Has edema in the left leg with erythema but improving   Past Medical History-  Patient Active Problem List   Diagnosis Date Noted  . Former smoker 07/26/2014    Priority: High  . 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 DVT, HX OF 07/31/2007    Priority: High  . Hyperglycemia 12/05/2016    Priority: Medium  . Short-term memory loss 06/15/2015    Priority: Medium  . Essential hypertension, benign 07/26/2014    Priority: Medium  . Depression, major, recurrent, in complete remission ([place]) 03/24/2009    Priority: Medium  . Hyperlipidemia 07/31/2007    Priority: Medium  . 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 Osteoarthritis 07/15/2008    Priority: Low  . Venous (peripheral) insufficiency 12/11/2007    Priority: Low  . Allergic rhinitis 07/31/2007    Priority: Low  . History of colonic polyps 07/31/2007    Priority: Low  . BPH associated with nocturia 03/10/2018  . Left inguinal hernia 12/05/2016    Medications- reviewed and updated Current Outpatient Medications  Medication Sig Dispense Refill  . benazepril (LOTENSIN) 20 MG tablet TAKE 1 TABLET BY MOUTH ONCE DAILY 90 tablet 2  . buPROPion (WELLBUTRIN XL) 300 MG 24 hr tablet TAKE ONE TABLET BY MOUTH ONCE DAILY 90 tablet 3  . desvenlafaxine (PRISTIQ) 50 MG 24 hr tablet Take 1 tablet (50 mg total) by mouth daily. 90 tablet 3  . doxycycline (PERIOSTAT) 20 MG tablet Reported on 12/15/2015    . doxycycline (VIBRA-TABS) 100 MG tablet Take 1 tablet (100 mg total) by mouth 2 (two) times daily. 20 tablet 0  . Loratadine (CLARITIN) 10 MG CAPS Take by mouth daily.    . rivaroxaban (XARELTO) 20 MG TABS tablet Take 1 tablet (20 mg total) by mouth daily. 90 tablet 3  . rosuvastatin (CRESTOR) 40 MG  tablet Take 1 tablet (40 mg total) by mouth daily. 90 tablet 3   No current facility-administered medications for this visit.     Objective: BP 132/82   Pulse 84   Temp 98.7 F (37.1 C) (Oral)   Ht 6\' 4"  (1.93 m)   Wt 229 lb 6.4 oz (104.1 kg)   SpO2 99%   BMI 27.92 kg/m  Gen: NAD, resting comfortably CV: RRR no murmurs rubs or gallops Lungs: CTAB no crackles, wheeze, rhonchi Abdomen: soft/nontender/nondistended Ext: no edema on right, 1+ edema with erythema on right leg- possible hematoma around site of prior laceration Skin: warm, dry   Assessment/Plan:  Cellulitis- Leg improving some on doxycycline- at least 5 more days antibiotics. Return precautions given for worsening symptoms or failure to continue to improve  Depression, major, recurrent, in complete remission ([place]) S: remains on pristiq 50mg  and wellbutrin 300mg  XR with good control A/P: continue current rx. Doesn't look like this is contributing to memory loss  Hyperlipidemia S: reasonably controlled on crestor 40mg  Lab Results  Component Value Date   CHOL 155 03/20/2018   HDL 56.30 03/20/2018   LDLCALC 78 03/20/2018   LDLDIRECT 102.0 09/08/2017   TRIG 100.0 03/20/2018   CHOLHDL 3 03/20/2018   A/P: continue current rx  Essential hypertension, benign S: controlled on  benazepril 20mg  on repeat  BP Readings from Last 3 Encounters:  06/23/18 132/82  06/19/18 114/68  06/02/18 (!) 94/54  A/P: We discussed blood pressure goal of <140/90. Continue current meds  Factor V Leiden S: remains compliant with xarelto 20mg  A/P: continue current rx   Short-term memory loss S: no issues with long term recall- recall short term or within lats few weeks seems to be getting worse. Wife has noted issues for a few years but has been more often lately. No head trauma. Progressive but gradual. Mother was forgetful from 71s on but never diagnosed with dementia. Forgetting things like school names/street names in working with  the schools- these are newer locations for him in [place]. Lamonte Sakai snot gotten lost driving. google maps helps A/P: MMSE actually improved at 30/30- from 63/30 last November but having issues in day to day life with memory. will evaluate for reversible causes of dementia-phq9 (under 5),  CBC, CMET, RPR, HIV, TSH, B12. They request referral to neurology after conversation- I suspect mild cognitive impairment  Future Appointments  Date Time Provider [place]  09/17/2018  8:00 AM [facility] HEALTH COACH [facility]  03/12/2019  8:15 AM Marin Olp, MD [facility]   Lab/Order associations: Depression, major, recurrent, in complete remission ([place])  Hyperlipidemia, unspecified hyperlipidemia type  Essential hypertension, benign  Factor V Leiden ([place])  Short-term memory loss - Plan: CBC, Comprehensive metabolic panel, RPR, HIV antibody, TSH, Vitamin B12  Return precautions advised.  [name], MD

## 2018-06-23 NOTE — Assessment & Plan Note (Signed)
S: remains on pristiq 50mg  and wellbutrin 300mg  XR with good control A/P: continue current rx. Doesn't look like this is contributing to memory loss

## 2018-06-23 NOTE — Assessment & Plan Note (Signed)
S: reasonably controlled on crestor 40mg  Lab Results  Component Value Date   CHOL 155 03/20/2018   HDL 56.30 03/20/2018   LDLCALC 78 03/20/2018   LDLDIRECT 102.0 09/08/2017   TRIG 100.0 03/20/2018   CHOLHDL 3 03/20/2018   A/P: continue current rx

## 2018-06-23 NOTE — Assessment & Plan Note (Signed)
S: controlled on  benazepril 20mg  on repeat  BP Readings from Last 3 Encounters:  06/23/18 132/82  06/19/18 114/68  06/02/18 (!) 94/54  A/P: We discussed blood pressure goal of <140/90. Continue current meds

## 2018-06-23 NOTE — Assessment & Plan Note (Signed)
S: remains compliant with xarelto 20mg  A/P: continue current rx

## 2018-06-24 LAB — COMPREHENSIVE METABOLIC PANEL
ALBUMIN: 3.8 g/dL (ref 3.5–5.2)
ALK PHOS: 56 U/L (ref 39–117)
ALT: 23 U/L (ref 0–53)
AST: 15 U/L (ref 0–37)
BILIRUBIN TOTAL: 0.3 mg/dL (ref 0.2–1.2)
BUN: 26 mg/dL — ABNORMAL HIGH (ref 6–23)
CALCIUM: 9.4 mg/dL (ref 8.4–10.5)
CHLORIDE: 105 meq/L (ref 96–112)
CO2: 26 mEq/L (ref 19–32)
CREATININE: 1.06 mg/dL (ref 0.40–1.50)
GFR: 74.18 mL/min (ref >60.00)
Glucose, Bld: 96 mg/dL (ref 70–99)
Potassium: 4.2 mEq/L (ref 3.5–5.1)
Sodium: 139 mEq/L (ref 135–145)
TOTAL PROTEIN: 6.2 g/dL (ref 6.0–8.3)

## 2018-06-24 LAB — CBC
HCT: 42.1 % (ref 39.0–52.0)
Hemoglobin: 14.4 g/dL (ref 13.0–17.0)
MCHC: 34.2 g/dL (ref 30.0–36.0)
MCV: 96.7 fl (ref 78.0–100.0)
PLATELETS: 241 10*3/uL (ref 150.0–400.0)
RBC: 4.36 Mil/uL (ref 4.22–5.81)
RDW: 13.1 % (ref 11.5–15.5)
WBC: 8.4 10*3/uL (ref 4.0–10.5)

## 2018-06-24 LAB — RPR: RPR: NONREACTIVE

## 2018-06-24 LAB — HIV ANTIBODY (ROUTINE TESTING W REFLEX): HIV 1&2 Ab, 4th Generation: NONREACTIVE

## 2018-06-24 LAB — TSH: TSH: 0.77 u[IU]/mL (ref 0.35–4.50)

## 2018-06-24 LAB — VITAMIN B12: Vitamin B-12: 701 pg/mL (ref 211–911)

## 2018-07-04 ENCOUNTER — Other Ambulatory Visit: Payer: Self-pay | Admitting: Family Medicine

## 2018-07-15 ENCOUNTER — Encounter: Payer: Self-pay | Admitting: Family Medicine

## 2018-07-15 ENCOUNTER — Encounter: Payer: Self-pay | Admitting: Neurology

## 2018-07-15 ENCOUNTER — Ambulatory Visit: Payer: Medicare Other | Admitting: Family Medicine

## 2018-07-15 VITALS — BP 120/82 | HR 81 | Temp 98.9°F | Ht 76.0 in | Wt 231.8 lb

## 2018-07-15 DIAGNOSIS — I10 Essential (primary) hypertension: Secondary | ICD-10-CM

## 2018-07-15 DIAGNOSIS — R413 Other amnesia: Secondary | ICD-10-CM | POA: Diagnosis not present

## 2018-07-15 DIAGNOSIS — E785 Hyperlipidemia, unspecified: Secondary | ICD-10-CM

## 2018-07-15 DIAGNOSIS — F3342 Major depressive disorder, recurrent, in full remission: Secondary | ICD-10-CM | POA: Diagnosis not present

## 2018-07-15 DIAGNOSIS — Z23 Encounter for immunization: Secondary | ICD-10-CM | POA: Diagnosis not present

## 2018-07-15 MED ORDER — BUPROPION HCL ER (XL) 150 MG PO TB24: 150.0000 mg | ORAL_TABLET | Freq: Every day | ORAL | 3 refills | Status: DC

## 2018-07-15 NOTE — Assessment & Plan Note (Signed)
S: controlled on benazepril 20mg  BP Readings from Last 3 Encounters:  07/15/18 120/82  06/23/18 132/82  06/19/18 114/68  A/P: We discussed blood pressure goal of <140/90. Continue current meds

## 2018-07-15 NOTE — Patient Instructions (Addendum)
Health Maintenance Due  Topic Date Due  . COLONOSCOPY - Please contact GI office at [phone].  02/12/2018   We will reduce wellbutrin to 150mg  when he runs out of 300 mg pills (or can go ahead and change). Advised 3 month check in after he changes dose to see if we can further  Reduce  We will call you within two weeks about your referral to neurology. If you do not hear within 3 weeks, give [place] a call.  I apologize that I did not order this at last visit- it was my intention to do so.

## 2018-07-15 NOTE — Assessment & Plan Note (Signed)
S: Depression has been well controlled on pristiq 50mg  and wellbutrin 300mg .  Depression screen PHQ 2/9 06/23/2018  Decreased Interest 1  Down, Depressed, Hopeless 0  PHQ - 2 Score 1  Altered sleeping 0  Tired, decreased energy 1  Change in appetite 0  Feeling bad or failure about yourself  0  Trouble concentrating 0  Moving slowly or fidgety/restless 0  Suicidal thoughts 0  PHQ-9 Score 2  Difficult doing work/chores Not difficult at all  A/P: He has been doing well, would like to reduce medicine. We will reduce wellbutrin to 150mg  when he runs out of 300 mg pills (or can go ahead and change). Advised 3 month check in after he changes dose to see if we can further  Reduce. Would remove/reduce pristiq last

## 2018-07-15 NOTE — Assessment & Plan Note (Signed)
S: reasonably controlled on crestor 40mg  Lab Results  Component Value Date   CHOL 155 03/20/2018   HDL 56.30 03/20/2018   LDLCALC 78 03/20/2018   LDLDIRECT 102.0 09/08/2017   TRIG 100.0 03/20/2018   CHOLHDL 3 03/20/2018   A/P: continue current rx

## 2018-07-15 NOTE — Progress Notes (Signed)
Subjective:  Rodney Scott is a 67 y.o. year old very pleasant male patient who presents for/with See problem oriented charting ROS- continued issues with memory. Some left leg swelling. No reported chest pain or shortness of breath. No SI. Denies depressed mood    Past Medical History-  Patient Active Problem List   Diagnosis Date Noted  . Former smoker 07/26/2014    Priority: High  . Factor V Leiden ([place]) 07/31/2007    Priority: High  . DVT, HX OF 07/31/2007    Priority: High  . Hyperglycemia 12/05/2016    Priority: Medium  . Short-term memory loss 06/15/2015    Priority: Medium  . Essential hypertension, benign 07/26/2014    Priority: Medium  . Depression, major, recurrent, in complete remission ([place]) 03/24/2009    Priority: Medium  . Hyperlipidemia 07/31/2007    Priority: Medium  . 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 Osteoarthritis 07/15/2008    Priority: Low  . Venous (peripheral) insufficiency 12/11/2007    Priority: Low  . Allergic rhinitis 07/31/2007    Priority: Low  . History of colonic polyps 07/31/2007    Priority: Low  . BPH associated with nocturia 03/10/2018  . Left inguinal hernia 12/05/2016    Medications- reviewed and updated Current Outpatient Medications  Medication Sig Dispense Refill  . benazepril (LOTENSIN) 20 MG tablet TAKE 1 TABLET BY MOUTH ONCE DAILY 90 tablet 2  . buPROPion (WELLBUTRIN XL) 150 MG 24 hr tablet Take 1 tablet (150 mg total) by mouth daily. 90 tablet 3  . desvenlafaxine (PRISTIQ) 50 MG 24 hr tablet TAKE 1 TABLET BY MOUTH ONCE DAILY 90 tablet 3  . Loratadine (CLARITIN) 10 MG CAPS Take by mouth daily.    . rosuvastatin (CRESTOR) 40 MG tablet Take 1 tablet (40 mg total) by mouth daily. 90 tablet 3  . XARELTO 20 MG TABS tablet TAKE 1 TABLET BY MOUTH ONCE DAILY 90 tablet 3   No current facility-administered medications for this visit.     Objective: BP 120/82 (BP Location: Left Arm, Patient Position:  Sitting, Cuff Size: Large)   Pulse 81   Temp 98.9 F (37.2 C) (Oral)   Ht 6\' 4"  (1.93 m)   Wt 231 lb 12.8 oz (105.1 kg)   SpO2 97%   BMI 28.22 kg/m  Gen: NAD, resting comfortably CV: RRR no murmurs rubs or gallops Lungs: CTAB no crackles, wheeze, rhonchi Abdomen: soft/nontender/nondistended/normal bowel sounds. No rebound or guarding.  Ext: no edema on right- some varicose veins. On left leg edema has improved to trace edema from 1+ last time. Area of enlargement/swelling (likely hematoma) has decreased in size. Surrounding erythema is back to near baseline. No warmth Skin: warm, dry, erythematous excoriated patch on right upper back at site that he removed a tick (admits to scratching this regularly- we discussed avoiding this and trying hydrocortisone- if doesn't resolve return to care or we can send to dermatology)    Assessment/Plan:  Depression, major, recurrent, in complete remission ([place]) S: Depression has been well controlled on pristiq 50mg  and wellbutrin 300mg .  Depression screen [facility] 2/9 06/23/2018  Decreased Interest 1  Down, Depressed, Hopeless 0  PHQ - 2 Score 1  Altered sleeping 0  Tired, decreased energy 1  Change in appetite 0  Feeling bad or failure about yourself  0  Trouble concentrating 0  Moving slowly or fidgety/restless 0  Suicidal thoughts 0  PHQ-9 Score 2  Difficult doing work/chores Not difficult at  all  A/P: He has been doing well, would like to reduce medicine. We will reduce wellbutrin to 150mg  when he runs out of 300 mg pills (or can go ahead and change). Advised 3 month check in after he changes dose to see if we can further  Reduce. Would remove/reduce pristiq last  Hyperlipidemia S: reasonably controlled on crestor 40mg  Lab Results  Component Value Date   CHOL 155 03/20/2018   HDL 56.30 03/20/2018   LDLCALC 78 03/20/2018   LDLDIRECT 102.0 09/08/2017   TRIG 100.0 03/20/2018   CHOLHDL 3 03/20/2018   A/P: continue current rx  Essential  hypertension, benign S: controlled on benazepril 20mg  BP Readings from Last 3 Encounters:  07/15/18 120/82  06/23/18 132/82  06/19/18 114/68  A/P: We discussed blood pressure goal of <140/90. Continue current meds  Seen for memory loss last visit and requested neurology referral- I reviewed chart and I did not input referral so referred today- please reference last note about memory los sbut he is having continued issues.   Future Appointments  Date Time Provider [place]  09/17/2018  8:00 AM [facility] CO[facility]  03/12/2019  8:20 AM Marin Olp, MD [facility]   Lab/Order associations: Memory loss - Plan: Ambulatory referral to Neurology  Need for prophylactic vaccination and inoculation against influenza - Plan: Flu vaccine HIGH DOSE PF  Need for prophylactic vaccination against Streptococcus pneumoniae (pneumococcus) - Plan: Pneumococcal polysaccharide vaccine 23-valent greater than or equal to 2yo subcutaneous/IM  Depression, major, recurrent, in complete remission ([place])  Hyperlipidemia, unspecified hyperlipidemia type  Essential hypertension, benign  Meds ordered this encounter  Medications  . buPROPion (WELLBUTRIN XL) 150 MG 24 hr tablet    Sig: Take 1 tablet (150 mg total) by mouth daily.    Dispense:  90 tablet    Refill:  3   Return precautions advised.  [name], MD

## 2018-08-21 ENCOUNTER — Other Ambulatory Visit: Payer: Self-pay

## 2018-08-21 MED ORDER — ROSUVASTATIN CALCIUM 40 MG PO TABS: 40.0000 mg | ORAL_TABLET | Freq: Every day | ORAL | 3 refills | Status: DC

## 2018-09-08 ENCOUNTER — Encounter: Payer: Self-pay | Admitting: Gastroenterology

## 2018-09-08 ENCOUNTER — Ambulatory Visit (AMBULATORY_SURGERY_CENTER): Payer: Self-pay

## 2018-09-08 VITALS — Ht 76.0 in | Wt 238.0 lb

## 2018-09-08 DIAGNOSIS — Z86010 Personal history of colonic polyps: Secondary | ICD-10-CM

## 2018-09-08 MED ORDER — NA SULFATE-K SULFATE-MG SULF 17.5-3.13-1.6 GM/177ML PO SOLN: 1.0000 | Freq: Once | ORAL | 0 refills | Status: AC

## 2018-09-08 NOTE — Progress Notes (Signed)
Denies allergies to eggs or soy products. Denies complication of anesthesia or sedation. Denies use of weight loss medication. Denies use of O2.   Emmi instructions declined.   Patient has short term  memory loss. Extra time was spent reviewing instructions.

## 2018-09-17 ENCOUNTER — Ambulatory Visit: Payer: Medicare Other

## 2018-09-24 ENCOUNTER — Encounter: Payer: Self-pay | Admitting: Gastroenterology

## 2018-09-24 ENCOUNTER — Ambulatory Visit (AMBULATORY_SURGERY_CENTER): Payer: Medicare Other | Admitting: Gastroenterology

## 2018-09-24 VITALS — BP 106/80 | HR 62 | Temp 97.1°F | Resp 13 | Ht 76.0 in | Wt 231.0 lb

## 2018-09-24 DIAGNOSIS — Z860101 Personal history of adenomatous and serrated colon polyps: Secondary | ICD-10-CM

## 2018-09-24 DIAGNOSIS — D123 Benign neoplasm of transverse colon: Secondary | ICD-10-CM

## 2018-09-24 DIAGNOSIS — D128 Benign neoplasm of rectum: Secondary | ICD-10-CM

## 2018-09-24 DIAGNOSIS — D129 Benign neoplasm of anus and anal canal: Secondary | ICD-10-CM

## 2018-09-24 DIAGNOSIS — D120 Benign neoplasm of cecum: Secondary | ICD-10-CM

## 2018-09-24 DIAGNOSIS — Z86010 Personal history of colonic polyps: Secondary | ICD-10-CM

## 2018-09-24 MED ORDER — SODIUM CHLORIDE 0.9 % IV SOLN: 500.0000 mL | Freq: Once | INTRAVENOUS | Status: DC

## 2018-09-24 NOTE — Progress Notes (Signed)
Called to room to assist during endoscopic procedure.  Patient ID and intended procedure confirmed with present staff. Received instructions for my participation in the procedure from the performing physician.  

## 2018-09-24 NOTE — Patient Instructions (Signed)
YOU HAD AN ENDOSCOPIC PROCEDURE TODAY AT THE [facility]:   Refer to the procedure report that was given to you for any specific questions about what was found during the examination.  If the procedure report does not answer your questions, please call your gastroenterologist to clarify.  If you requested that your care partner not be given the details of your procedure findings, then the procedure report has been included in a sealed envelope for you to review at your convenience later.  YOU SHOULD EXPECT: Some feelings of bloating in the abdomen. Passage of more gas than usual.  Walking can help get rid of the air that was put into your GI tract during the procedure and reduce the bloating. If you had a lower endoscopy (such as a colonoscopy or flexible sigmoidoscopy) you may notice spotting of blood in your stool or on the toilet paper. If you underwent a bowel prep for your procedure, you may not have a normal bowel movement for a few days.  Please Note:  You might notice some irritation and congestion in your nose or some drainage.  This is from the oxygen used during your procedure.  There is no need for concern and it should clear up in a day or so.  SYMPTOMS TO REPORT IMMEDIATELY:   Following lower endoscopy (colonoscopy or flexible sigmoidoscopy):  Excessive amounts of blood in the stool  Significant tenderness or worsening of abdominal pains  Swelling of the abdomen that is new, acute  Fever of 100F or higher  For urgent or emergent issues, a gastroenterologist can be reached at any hour by calling [phone].   DIET:  We do recommend a small meal at first, but then you may proceed to your regular diet.  Drink plenty of fluids but you should avoid alcoholic beverages for 24 hours.  ACTIVITY:  You should plan to take it easy for the rest of today and you should NOT DRIVE or use heavy machinery until tomorrow (because of the sedation medicines used during the test).     FOLLOW UP: Our staff will call the number listed on your records the next business day following your procedure to check on you and address any questions or concerns that you may have regarding the information given to you following your procedure. If we do not reach you, we will leave a message.  However, if you are feeling well and you are not experiencing any problems, there is no need to return our call.  We will assume that you have returned to your regular daily activities without incident.  If any biopsies were taken you will be contacted by phone or by letter within the next 1-3 weeks.  Please call us at [phone] if you have not heard about the biopsies in 3 weeks.    SIGNATURES/CONFIDENTIALITY: You and/or your care partner have signed paperwork which will be entered into your electronic medical record.  These signatures attest to the fact that that the information above on your After Visit Summary has been reviewed and is understood.  Full responsibility of the confidentiality of this discharge information lies with you and/or your care-partner. 

## 2018-09-24 NOTE — Op Note (Signed)
[facility] Patient Name: Rodney Scott Procedure Date: 09/24/2018 10:40 AM MRN: [id number] Endoscopist: Ladene Artist , MD Age: 67 Referring MD:  Date of Birth: November 16, 1950 Gender: Male Account #: [id number] Procedure:                Colonoscopy Indications:              Surveillance: Personal history of adenomatous                            polyps on last colonoscopy 5 years ago Medicines:                Monitored Anesthesia Care Procedure:                Pre-Anesthesia Assessment:                           - Prior to the procedure, a History and Physical                            was performed, and patient medications and                            allergies were reviewed. The patient's tolerance of                            previous anesthesia was also reviewed. The risks                            and benefits of the procedure and the sedation                            options and risks were discussed with the patient.                            All questions were answered, and informed consent                            was obtained. Prior Anticoagulants: The patient has                            taken Xarelto (rivaroxaban), last dose was 2 days                            prior to procedure. ASA Grade Assessment: III - A                            patient with severe systemic disease. After                            reviewing the risks and benefits, the patient was                            deemed in satisfactory condition to undergo the  procedure.                           After obtaining informed consent, the colonoscope                            was passed under direct vision. Throughout the                            procedure, the patient's blood pressure, pulse, and                            oxygen saturations were monitored continuously. The                            Colonoscope was introduced through the anus and                     advanced to the the cecum, identified by                            appendiceal orifice and ileocecal valve. The                            ileocecal valve, appendiceal orifice, and rectum                            were photographed. The quality of the bowel                            preparation was good. The colonoscopy was performed                            without difficulty. The patient tolerated the                            procedure well. Scope In: 10:47:22 AM Scope Out: 11:12:37 AM Scope Withdrawal Time: 0 hours 21 minutes 14 seconds  Total Procedure Duration: 0 hours 25 minutes 15 seconds  Findings:                 The perianal and digital rectal examinations were                            normal.                           A 7 mm polyp was found in the rectum. The polyp was                            sessile. The polyp was removed with a cold snare.                            Resection and retrieval were complete. Persistent  oozing at polypectomy site. For hemostasis, two                            hemostatic clips were successfully placed (MR                            conditional). There was no bleeding at the end of                            the procedure.                           Five sessile polyps were found in the transverse                            colon. The polyps were 6 to 8 mm in size. These                            polyps were removed with a cold snare. Resection                            and retrieval were complete.                           A 4 mm polyp was found in the cecum. The polyp was                            sessile. The polyp was removed with a cold biopsy                            forceps. Resection and retrieval were complete.                           Multiple medium-mouthed diverticula were found in                            the left colon. There was narrowing of the colon in                             association with the diverticular opening. There                            was evidence of diverticular spasm.                            Peri-diverticular erythema was seen. There was no                            evidence of diverticular bleeding.                           Internal hemorrhoids were found during  retroflexion. The hemorrhoids were medium-sized and                            Grade I (internal hemorrhoids that do not prolapse).                           The exam was otherwise without abnormality on                            direct and retroflexion views. Complications:            No immediate complications. Estimated blood loss:                            None. Estimated Blood Loss:     Estimated blood loss: none. Impression:               - One 7 mm polyp in the rectum, removed with a cold                            snare. Resected and retrieved. Clips (MR                            conditional) were placed.                           - Five 6 to 8 mm polyps in the transverse colon,                            removed with a cold snare. Resected and retrieved.                           - One 4 mm polyp in the cecum, removed with a cold                            biopsy forceps. Resected and retrieved.                           - Moderate diverticulosis in the left colon.                           - Internal hemorrhoids.                           - The examination was otherwise normal on direct                            and retroflexion views. Recommendation:           - Repeat colonoscopy in 3 years for surveillance.                           - Resume Xarelto (rivaroxaban) tomorrow at prior                            dose.  Refer to managing physician for further                            adjustment of therapy.                           - Patient has a contact number available for                            emergencies. The signs and  symptoms of potential                            delayed complications were discussed with the                            patient. Return to normal activities tomorrow.                            Written discharge instructions were provided to the                            patient.                           - Resume previous diet.                           - Continue present medications.                           - Await pathology results.                           - No aspirin, ibuprofen, naproxen, or other                            non-steroidal anti-inflammatory drugs for 2 weeks                            after polyp removal. Ladene Artist, MD 09/24/2018 11:22:06 AM This report has been signed electronically.

## 2018-09-24 NOTE — Progress Notes (Signed)
To PACU, VSS. Report to [email or web address] 

## 2018-09-24 NOTE — Progress Notes (Signed)
Pt's states no medical or surgical changes since previsit or office visit. 

## 2018-09-25 ENCOUNTER — Encounter: Payer: Self-pay | Admitting: Neurology

## 2018-09-25 ENCOUNTER — Other Ambulatory Visit: Payer: Self-pay

## 2018-09-25 ENCOUNTER — Ambulatory Visit: Payer: Medicare Other | Admitting: Neurology

## 2018-09-25 ENCOUNTER — Telehealth: Payer: Self-pay

## 2018-09-25 ENCOUNTER — Ambulatory Visit (INDEPENDENT_AMBULATORY_CARE_PROVIDER_SITE_OTHER): Payer: Medicare Other

## 2018-09-25 VITALS — BP 130/82 | HR 102 | Ht 75.0 in | Wt 229.0 lb

## 2018-09-25 VITALS — BP 124/84 | HR 84 | Ht 75.0 in | Wt 229.6 lb

## 2018-09-25 DIAGNOSIS — Z0000 Encounter for general adult medical examination without abnormal findings: Secondary | ICD-10-CM | POA: Diagnosis not present

## 2018-09-25 DIAGNOSIS — R413 Other amnesia: Secondary | ICD-10-CM

## 2018-09-25 DIAGNOSIS — G3184 Mild cognitive impairment, so stated: Secondary | ICD-10-CM | POA: Diagnosis not present

## 2018-09-25 MED ORDER — DONEPEZIL HCL 5 MG PO TABS: ORAL_TABLET | ORAL | 11 refills | Status: DC

## 2018-09-25 NOTE — Progress Notes (Signed)
NEUROLOGY CONSULTATION NOTE  Rodney Scott MRN: [id number] DOB: 12/25/1950  Referring provider: Dr. [name] Primary care provider: Dr. [name]  Reason for consult:  Memory loss  Dear Dr Yong Channel:  Thank you for your kind referral of Rodney Scott for consultation of the above symptoms. Although his history is well known to you, please allow me to reiterate it for the purpose of our medical record. The patient was accompanied to the clinic by his wife who also provides collateral information. Records and images were personally reviewed where available.  HISTORY OF PRESENT ILLNESS: This is a 67 year old right-handed man with a history of hypertension, hyperlipidemia, Factor V Leiden deficiency, DVT on Xarelto, presenting for evaluation of worsening memory. He and his wife started noticing changes over the past year. He states long-term memory is much better than remembering what he did yesterday. He misplaces things frequently at home, they have lived in the same house for 30 years and his wife has to search for things that have always been in the same place for years. He denies getting lost driving but uses his GPS a lot, he cannot remember the way to places like before. He procrastinates on bills but generally does not miss bills, his wife nods that he has. He occasionally forgets his medications. 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 His wife's major concern is how this has affected caring for their 66 year old granddaughter who has been living with them for the past 5 years. He forgets to feed her, which is new. He goes to a store and forgets what he is doing, she has to give him a pretty detailed note so he can remember events for the day. She has noticed his language skills have decreased markedly, he is not a very verbal person, but she has noticed more difficulty expressing his thoughts or finding words. He may have a thought but  stumbles over the words. He repeats himself, one time last summer he told the same story 3-4 times within half an hour. He has always been easily frustrated, but she feels it is worse, such as when driving, "no one knows what they are doing." No paranoia or hallucinations.   He denies any headaches, dizziness, vision changes, neck/back pain, focal numbness/tingling/weakness, bowel/bladder dysfunction, or tremors. He has lost his sense of smell over the past year for no clear reason. He denies any head injuries. His mother had dementia in her 68s. 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 When asked about mood, he states "I don't know." His wife does not think he is depressed, she is unsure why he is still taking the Wellbutrin and Pristiq, she feels Wellbutrin was started 20 years ago for smoking cessation.   Laboratory Data: Lab Results  Component Value Date   TSH 0.77 06/23/2018   Lab Results  Component Value Date   RJJOACZY60 630 06/23/2018   Lab Results  Component Value Date   CHOL 155 03/20/2018   HDL 56.30 03/20/2018   LDLCALC 78 03/20/2018   LDLDIRECT 102.0 09/08/2017   TRIG 100.0 03/20/2018   CHOLHDL 3 03/20/2018    PAST MEDICAL HISTORY: Past Medical History:  Diagnosis Date  . Allergic rhinitis   . Allergy   . Clotting  disorder ([place])   . DVT (deep venous thrombosis) ([place])   . Factor V deficiency ([place])   . Hyperlipidemia   . Hypertension   . Internal hemorrhoid 11/24/2009   No bleeding. Patient states thought was external.     . Osteoarthritis   . Right rotator cuff tear 12/07/2010   S/p surgery.    . Tubular adenoma of colon 11/2006    PAST SURGICAL HISTORY: Past Surgical History:  Procedure Laterality Date  . right rotator cuff repair    . SALIVARY STONE REMOVAL      MEDICATIONS: Current Outpatient  Medications on File Prior to Visit  Medication Sig Dispense Refill  . benazepril (LOTENSIN) 20 MG tablet TAKE 1 TABLET BY MOUTH ONCE DAILY 90 tablet 2  . buPROPion (WELLBUTRIN XL) 150 MG 24 hr tablet Take 1 tablet (150 mg total) by mouth daily. 90 tablet 3  . desvenlafaxine (PRISTIQ) 50 MG 24 hr tablet TAKE 1 TABLET BY MOUTH ONCE DAILY 90 tablet 3  . Loratadine (CLARITIN) 10 MG CAPS Take by mouth daily.    . rosuvastatin (CRESTOR) 40 MG tablet Take 1 tablet (40 mg total) by mouth daily. 90 tablet 3  . XARELTO 20 MG TABS tablet TAKE 1 TABLET BY MOUTH ONCE DAILY 90 tablet 3   No current facility-administered medications on file prior to visit.     ALLERGIES: Allergies  Allergen Reactions  . Yellow Jacket Venom [Bee Venom] Shortness Of Breath    Had swelling localized, chest tightness, and shortness of breath  . Cephalexin     REACTION: solar rash    FAMILY HISTORY: Family History  Problem Relation Age of Onset  . Colon cancer Father 41  . Factor V Leiden deficiency Son   . Esophageal cancer Neg Hx   . Rectal cancer Neg Hx   . Stomach cancer Neg Hx     SOCIAL HISTORY: Social History   Socioeconomic History  . Marital status: Married    Spouse name: Not on file  . Number of children: Not on file  . Years of education: Not on file  . Highest education level: Not on file  Occupational History  . Not on file  Social Needs  . Financial resource strain: Not on file  . Food insecurity:    Worry: Not on file    Inability: Not on file  . Transportation needs:    Medical: Not on file    Non-medical: Not on file  Tobacco Use  . Smoking status: Current Some Day Smoker    Packs/day: 0.25    Years: 45.00    Pack years: 11.25    Types: Cigarettes    Last attempt to quit: 11/06/2014    Years since quitting: 3.8  . Smokeless tobacco: Never Used  Substance and Sexual Activity  . Alcohol use: Yes    Alcohol/week: 20.0 - 22.0 standard drinks    Types: 14 Cans of beer, 6 - 8  Standard drinks or equivalent per week  . Drug use: No  . Sexual activity: Yes  Lifestyle  . Physical activity:    Days per week: Not on file    Minutes per session: Not on file  . Stress: Not on file  Relationships  . Social connections:    Talks on phone: Not on file    Gets together: Not on file    Attends religious service: Not on file    Active member of club or organization: Not on file  Attends meetings of clubs or organizations: Not on file    Relationship status: Not on file  . Intimate partner violence:    Fear of current or ex partner: Not on file    Emotionally abused: Not on file    Physically abused: Not on file    Forced sexual activity: Not on file  Other Topics Concern  . Not on file  Social History Narrative   Married (wife patient of Dr. Yong Channel). Son and daughter. 1 granddaughter.       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: time around house, time with family, yardwork, some cabinetry    REVIEW OF SYSTEMS: Constitutional: No fevers, chills, or sweats, no generalized fatigue, change in appetite Eyes: No visual changes, double vision, eye pain Ear, nose and throat: No hearing loss, ear pain, nasal congestion, sore throat Cardiovascular: No chest pain, palpitations Respiratory:  No shortness of breath at rest or with exertion, wheezes GastrointestinaI: No nausea, vomiting, diarrhea, abdominal pain, fecal incontinence Genitourinary:  No dysuria, urinary retention or frequency Musculoskeletal:  No neck pain, back pain Integumentary: No rash, pruritus, skin lesions Neurological: as above Psychiatric: No depression, insomnia, anxiety Endocrine: No palpitations, fatigue, diaphoresis, mood swings, change in appetite, change in weight, increased thirst Hematologic/Lymphatic:  No anemia, purpura, petechiae. Allergic/Immunologic: no itchy/runny eyes, nasal  congestion, recent allergic reactions, rashes  PHYSICAL EXAM: Vitals:   09/25/18 0952  BP: 130/82  Pulse: (!) 102  SpO2: 99%   General: No acute distress Head:  Normocephalic/atraumatic Eyes: Fundoscopic exam shows bilateral sharp discs, no vessel changes, exudates, or hemorrhages Neck: supple, no paraspinal tenderness, full range of motion Back: No paraspinal tenderness Heart: regular rate and rhythm Lungs: Clear to auscultation bilaterally. Vascular: No carotid bruits. Skin/Extremities: No rash, no edema Neurological Exam: Mental status: alert and oriented to person, place, and time, no dysarthria or aphasia but appears to have difficulty finding words, saying "you know" a lot, which wife reports is not his baseline. Fund of knowledge is appropriate.  Remote memory intact.  Attention and concentration are normal.    Able to name objects and repeat phrases.  Montreal Cognitive Assessment  09/25/2018  Visuospatial/ Executive (0/5) 5  Naming (0/3) 3  Attention: Read list of digits (0/2) 2  Attention: Read list of letters (0/1) 1  Attention: Serial 7 subtraction starting at 100 (0/3) 3  Language: Repeat phrase (0/2) 2  Language : Fluency (0/1) 1  Abstraction (0/2) 2  Delayed Recall (0/5) 0  Orientation (0/6) 6  Total 25   Cranial nerves: CN I: not tested CN II: pupils equal, round and reactive to light, visual fields intact, fundi unremarkable. CN III, IV, VI:  full range of motion, no nystagmus, no ptosis CN V: facial sensation intact CN VII: upper and lower face symmetric CN VIII: hearing intact to finger rub CN IX, X: gag intact, uvula midline CN XI: sternocleidomastoid and trapezius muscles intact CN XII: tongue midline Bulk & Tone: normal, no fasciculations. Motor: 5/5 throughout with no pronator drift. Sensation: intact to light touch, cold, pin. Decreased vibration to knees bilaterally. No extinction to double simultaneous stimulation.  Romberg test negative Deep  Tendon Reflexes: +2 throughout, no ankle clonus Plantar responses: downgoing bilaterally Cerebellar: no incoordination on finger to nose, heel to shin. No dysdiadochokinesia Gait: narrow-based and steady, able to tandem walk adequately. Tremor: none  IMPRESSION: This is a  67 year old right-handed man with a history of  hypertension, hyperlipidemia, Factor V Leiden deficiency, DVT on Xarelto, presenting for evaluation of worsening memory. He is noted to have some difficulty communicating his thoughts, which his wife reports is not his baseline. She has been concerned about his language skills decreasing. His MOCA score today is 25/30, indicating mild cognitive impairment. Etiology of symptoms unclear, TSH and B12 normal. MRI brain with and without contrast will be ordered to assess for underlying structural abnormality. Primary progressive aphasia is considered with his language difficulties, concern has been raised about depression/mood, although he and his wife feel he is fine. Neuropsychological evaluation will be helpful to further delineate cognitive profile. He is agreeable to starting Donepezil 5mg  daily, side effects and expectations from the medication were discussed. We discussed their concern for statins causing memory issues, he can try holding it for 2 weeks, but if no improvement in symptoms, would restart, as we discussed the importance of control of vascular risk factors, physical exercise, and brain stimulation exercises for brain health. He will follow-up after the tests and knows to call for any changes.   Thank you for allowing me to participate in the care of this patient. Please do not hesitate to call for any questions or concerns.   [name], M.D.  CC: Dr. Yong Channel

## 2018-09-25 NOTE — Telephone Encounter (Signed)
  Follow up Call-  Call back number 09/24/2018  Post procedure Call Back phone  # [phone]  Permission to leave phone message Yes  Some recent data might be hidden     Patient questions:  Do you have a fever, pain , or abdominal swelling? No. Pain Score  0 *  Have you tolerated food without any problems? Yes.    Have you been able to return to your normal activities? Yes.    Do you have any questions about your discharge instructions: Diet   No. Medications  No. Follow up visit  No.  Do you have questions or concerns about your Care? No.  Actions: * If pain score is 4 or above: No action needed, pain <4.

## 2018-09-25 NOTE — Patient Instructions (Addendum)
1. Schedule MRI brain with and without contrast  We have sent a referral to [facility] for your MRI and they will call you directly to schedule your appt. They are located at [place]. If you need to contact them directly please call [phone].   2. Schedule Neurocognitive testing at [facility]  We have sent a referral to [facility].  Their office will be in touch to schedule your initial appointment.  If, for any reason, you need to contact them, their office telephone number is [phone]  3. Start Donepezil 5mg : Take 1/2 tablet daily for 2 weeks, then increase to 1 tablet daily 4. Follow-up after testing   RECOMMENDATIONS FOR ALL PATIENTS WITH MEMORY PROBLEMS: 1. Continue to exercise (Recommend 30 minutes of walking everyday, or 3 hours every week) 2. Increase social interactions - continue going to [place] and enjoy social gatherings with friends and family 3. Eat healthy, avoid fried foods and eat more fruits and vegetables 4. Maintain adequate blood pressure, blood sugar, and blood cholesterol level. Reducing the risk of stroke and cardiovascular disease also helps promoting better memory. 5. Avoid stressful situations. Live a simple life and avoid aggravations. Organize your time and prepare for the next day in anticipation. 6. Sleep well, avoid any interruptions of sleep and avoid any distractions in the bedroom that may interfere with adequate sleep quality 7. Avoid sugar, avoid sweets as there is a strong link between excessive sugar intake, diabetes, and cognitive impairment We discussed the Mediterranean diet, which has been shown to help patients reduce the risk of progressive memory disorders and reduces cardiovascular risk. This includes eating fish, eat fruits and green leafy vegetables, nuts like almonds and hazelnuts, walnuts, and also use olive oil. Avoid fast foods and fried foods as much as possible. Avoid sweets and sugar as sugar  use has been linked to worsening of memory function.

## 2018-09-25 NOTE — Progress Notes (Signed)
Subjective:   Rodney Scott is a 67 y.o. male who presents for Medicare Annual/Subsequent preventive examination.  Review of Systems:  No ROS.  Medicare Wellness Visit. Additional risk factors are reflected in the social history. Cardiac Risk Factors include: advanced age (>17men, >71 women) Patient lives with wife of 46 years and 76 year old granddaughter in a 2 story home. They have 1 dog and 2 cats. They have a son and a daughter. 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   Patient goes to bed around 9-9:30pm. Gets up around 5 am. Gets up about 1 time a night to go to the bathroom. No CPAP.    Objective:    Vitals: BP 124/84 (BP Location: Left Arm, Patient Position: Sitting, Cuff Size: Large)   Pulse 84   Ht 6\' 3"  (1.905 m)   Wt 229 lb 9.6 oz (104.1 kg)   SpO2 97%   BMI 28.70 kg/m   Body mass index is 28.7 kg/m.  Advanced Directives 09/25/2018 02/13/2015  Does Patient Have a Medical Advance Directive? No Yes;No  Would patient like information on creating a medical advance directive? No - Patient declined No - patient declined information    Tobacco Social History   Tobacco Use  Smoking Status Current Some Day Smoker  . Packs/day: 0.25  . Years: 45.00  . Pack years: 11.25  . Types: Cigarettes  . Last attempt to quit: 11/06/2014  . Years since quitting: 3.8  Smokeless Tobacco Never Used     Ready to quit: Not Answered Counseling given: Not Answered   Past Medical History:  Diagnosis Date  . Allergic rhinitis   . Allergy   . Clotting disorder ([place])   . DVT (deep venous thrombosis) ([place])   . Factor V deficiency ([place])   . Hyperlipidemia   . Hypertension   . Internal hemorrhoid 11/24/2009   No bleeding. Patient states thought was external.     . Osteoarthritis   . Right rotator cuff tear 12/07/2010   S/p surgery.    . Tubular adenoma of colon 11/2006   Past Surgical History:  Procedure Laterality Date  .  right rotator cuff repair    . SALIVARY STONE REMOVAL     Family History  Problem Relation Age of Onset  . Colon cancer Father 58  . Factor V Leiden deficiency Son   . Esophageal cancer Neg Hx   . Rectal cancer Neg Hx   . Stomach cancer Neg Hx    Social History   Socioeconomic History  . Marital status: Married    Spouse name: Not on file  . Number of children: Not on file  . Years of education: Not on file  . Highest education level: Not on file  Occupational History  . Not on file  Social Needs  . Financial resource strain: Not on file  . Food insecurity:    Worry: Not on file    Inability: Not on file  . Transportation needs:    Medical: Not on file    Non-medical: Not on file  Tobacco Use  . Smoking status: Current Some Day Smoker    Packs/day: 0.25    Years: 45.00    Pack years: 11.25    Types: Cigarettes    Last attempt to quit: 11/06/2014    Years since quitting: 3.8  . Smokeless tobacco: Never Used  Substance and Sexual Activity  . Alcohol use: Yes  Alcohol/week: 20.0 - 22.0 standard drinks    Types: 14 Cans of beer, 6 - 8 Standard drinks or equivalent per week  . Drug use: No  . Sexual activity: Yes  Lifestyle  . Physical activity:    Days per week: Not on file    Minutes per session: Not on file  . Stress: Not on file  Relationships  . Social connections:    Talks on phone: Not on file    Gets together: Not on file    Attends religious service: Not on file    Active member of club or organization: Not on file    Attends meetings of clubs or organizations: Not on file    Relationship status: Not on file  Other Topics Concern  . Not on file  Social History Narrative   Married (wife patient of Dr. Yong Channel). Son and daughter. 1 granddaughter.       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 benazepril (LOTENSIN) 20 MG tablet TAKE 1 TABLET BY MOUTH ONCE DAILY  . buPROPion (WELLBUTRIN XL) 150 MG 24 hr tablet Take 1 tablet (150 mg total) by mouth daily.  Marland Kitchen desvenlafaxine (PRISTIQ) 50 MG 24 hr tablet TAKE 1 TABLET BY MOUTH ONCE DAILY  . donepezil (ARICEPT) 5 MG tablet Take 1/2 tablet daily for 2 weeks, then increase to 1 tablet daily  . Loratadine (CLARITIN) 10 MG CAPS Take by mouth daily.  . rosuvastatin (CRESTOR) 40 MG tablet Take 1 tablet (40 mg total) by mouth daily.  Alveda Reasons 20 MG TABS tablet TAKE 1 TABLET BY MOUTH ONCE DAILY   No facility-administered encounter medications on file as of 09/25/2018.     Activities of Daily Living In your present state of health, do you have any difficulty performing the following activities: 09/25/2018  Hearing? N  Vision? N  Difficulty concentrating or making decisions? N  Walking or climbing stairs? N  Dressing or bathing? N  Doing errands, shopping? N  Preparing Food and eating ? N  Using the Toilet? N  In the past six months, have you accidently leaked urine? N  Do you have problems with loss of bowel control? N  Managing your Medications? N  Managing your Finances? N  Housekeeping or managing your Housekeeping? N  Some recent data might be hidden    Patient Care Team: Marin Olp, MD as PCP - General (Family Medicine)   Assessment:   This is a routine wellness examination for Rodney Scott.  Exercise Activities and Dietary recommendations Current Exercise Habits: The patient does not participate in regular exercise at present, Exercise limited by: None identified  Breakfast: an egg omelette, Coffee with Splenda  Lunch: Sometimes left overs, ham and cheese sandwich on rye bread, Sugar free soda  Dinner: Meat and 2 vegetables, pizza, drinks  water  Snacks occasionally Goals    . DIET - EAT MORE FRUITS AND VEGETABLES       Fall Risk Fall Risk  09/25/2018 09/25/2018 09/08/2017 06/05/2017  Falls in the past year? 1 1 No No  Number falls in past yr: 0 0 - -  Injury with Fall? 1  1 - -    Depression Screen PHQ 2/9 Scores 09/25/2018 06/23/2018 09/08/2017 06/05/2017  PHQ - 2 Score 0 1 0 0  PHQ- 9 Score 0 2 - -    Cognitive Function MMSE - Mini Mental State Exam 09/25/2018  Orientation to time 5  Orientation to Place 5  Registration 3  Attention/ Calculation 5  Recall 3  Language- name 2 objects 2  Language- repeat 1  Language- follow 3 step command 3  Language- read & follow direction 1  Write a sentence 1  Copy design 1  Total score 30   Montreal Cognitive Assessment  09/25/2018  Visuospatial/ Executive (0/5) 5  Naming (0/3) 3  Attention: Read list of digits (0/2) 2  Attention: Read list of letters (0/1) 1  Attention: Serial 7 subtraction starting at 100 (0/3) 3  Language: Repeat phrase (0/2) 2  Language : Fluency (0/1) 1  Abstraction (0/2) 2  Delayed Recall (0/5) 0  Orientation (0/6) 6  Total 25      Immunization History  Administered Date(s) Administered  . Influenza Split 07/26/2011, 08/07/2012  . Influenza Whole 07/31/2007, 07/15/2008, 07/07/2009, 06/29/2010  . Influenza, High Dose Seasonal PF 09/08/2017, 07/15/2018  . Influenza,inj,Quad PF,6+ Mos 08/20/2013, 07/26/2014, 06/15/2015, 07/05/2016  . Pneumococcal Conjugate-13 06/05/2017  . Pneumococcal Polysaccharide-23 06/22/2010, 07/15/2018  . Td 10/14/2001  . Tdap 01/03/2012      Screening Tests Health Maintenance  Topic Date Due  . Hepatitis C Screening  11/11/2109 (Originally 04-18-1951)  . COLONOSCOPY  09/24/2021  . TETANUS/TDAP  01/02/2022  . INFLUENZA VACCINE  Completed  . PNA vac Low Risk Adult  Completed       Plan:  Follow Up with PCP as Advised  I have personally reviewed and noted the following in the patient's chart:    . Medical and social history . Use of alcohol, tobacco or illicit drugs  . Current medications and supplements . Functional ability and status . Nutritional status . Physical activity . Advanced directives . List of other physicians . Vitals . Screenings to include cognitive, depression, and falls . Referrals and appointments  In addition, I have reviewed and discussed with patient certain preventive protocols, quality metrics, and best practice recommendations. A written personalized care plan for preventive services as well as general preventive health recommendations were provided to patient.     [place], [place]  [id number]

## 2018-09-25 NOTE — Progress Notes (Signed)
PCP notes: Last OV 07/15/2018   Health maintenance: Up to date   Abnormal screenings: None   Patient concerns: None   Nurse concerns:None   Next PCP appt: 03/12/2019

## 2018-09-25 NOTE — Progress Notes (Signed)
I have reviewed and agree with note, evaluation, plan.   Stephen Hunter, MD  

## 2018-09-25 NOTE — Patient Instructions (Signed)
Rodney Scott , Thank you for taking time to come for your Medicare Wellness Visit. I appreciate your ongoing commitment to your health goals. Please review the following plan we discussed and let me know if I can assist you in the future.   These are the goals we discussed: Goals    . DIET - EAT MORE FRUITS AND VEGETABLES       This is a list of the screening recommended for you and due dates:  Health Maintenance  Topic Date Due  .  Hepatitis C: One time screening is recommended by Center for Disease Control  (CDC) for  adults born from [birth year] through 1964.   11/11/2109*  . Colon Cancer Screening  09/24/2021  . Tetanus Vaccine  01/02/2022  . Flu Shot  Completed  . Pneumonia vaccines  Completed  *Topic was postponed. The date shown is not the original due date.   Preventive Care for Adults  A healthy lifestyle and preventive care can promote health and wellness. Preventive health guidelines for adults include the following key practices.  . A routine yearly physical is a good way to check with your health care provider about your health and preventive screening. It is a chance to share any concerns and updates on your health and to receive a thorough exam.  . Visit your dentist for a routine exam and preventive care every 6 months. Brush your teeth twice a day and floss once a day. Good oral hygiene prevents tooth decay and gum disease.  . The frequency of eye exams is based on your age, health, family medical history, use  of contact lenses, and other factors. Follow your health care provider's recommendations for frequency of eye exams.  . Eat a healthy diet. Foods like vegetables, fruits, whole grains, low-fat dairy products, and lean protein foods contain the nutrients you need without too many calories. Decrease your intake of foods high in solid fats, added sugars, and salt. Eat the right amount of calories for you. Get information about a proper diet from your health care provider,  if necessary.  . Regular physical exercise is one of the most important things you can do for your health. Most adults should get at least 150 minutes of moderate-intensity exercise (any activity that increases your heart rate and causes you to sweat) each week. In addition, most adults need muscle-strengthening exercises on 2 or more days a week.  Silver Sneakers may be a benefit available to you. To determine eligibility, you may visit the website: www.silversneakers.com or contact program at 272-113-6429 Mon-Fri between 8AM-8PM.   . Maintain a healthy weight. The body mass index (BMI) is a screening tool to identify possible weight problems. It provides an estimate of body fat based on height and weight. Your health care provider can find your BMI and can help you achieve or maintain a healthy weight.   For adults 20 years and older: ? A BMI below 18.5 is considered underweight. ? A BMI of 18.5 to 24.9 is normal. ? A BMI of 25 to 29.9 is considered overweight. ? A BMI of 30 and above is considered obese.   . Maintain normal blood lipids and cholesterol levels by exercising and minimizing your intake of saturated fat. Eat a balanced diet with plenty of fruit and vegetables. Blood tests for lipids and cholesterol should begin at age 74 and be repeated every 5 years. If your lipid or cholesterol levels are high, you are over 50, or you are at  high risk for heart disease, you may need your cholesterol levels checked more frequently. Ongoing high lipid and cholesterol levels should be treated with medicines if diet and exercise are not working.  . If you smoke, find out from your health care provider how to quit. If you do not use tobacco, please do not start.  . If you choose to drink alcohol, please do not consume more than 2 drinks per day. One drink is considered to be 12 ounces (355 mL) of beer, 5 ounces (148 mL) of wine, or 1.5 ounces (44 mL) of liquor.  . If you are 13-28 years old, ask  your health care provider if you should take aspirin to prevent strokes.  . Use sunscreen. Apply sunscreen liberally and repeatedly throughout the day. You should seek shade when your shadow is shorter than you. Protect yourself by wearing long sleeves, pants, a wide-brimmed hat, and sunglasses year round, whenever you are outdoors.  . Once a month, do a whole body skin exam, using a mirror to look at the skin on your back. Tell your health care provider of new moles, moles that have irregular borders, moles that are larger than a pencil eraser, or moles that have changed in shape or color.

## 2018-10-02 ENCOUNTER — Encounter: Payer: Self-pay | Admitting: Gastroenterology

## 2018-10-06 ENCOUNTER — Ambulatory Visit: Payer: Medicare Other | Admitting: Family Medicine

## 2018-10-06 ENCOUNTER — Ambulatory Visit: Payer: Self-pay | Admitting: Physician Assistant

## 2018-10-06 ENCOUNTER — Other Ambulatory Visit: Payer: Self-pay | Admitting: Family Medicine

## 2018-10-06 ENCOUNTER — Encounter: Payer: Self-pay | Admitting: Family Medicine

## 2018-10-06 VITALS — BP 124/80 | HR 103 | Temp 98.5°F | Ht 75.0 in | Wt 230.8 lb

## 2018-10-06 DIAGNOSIS — R059 Cough, unspecified: Secondary | ICD-10-CM

## 2018-10-06 DIAGNOSIS — R05 Cough: Secondary | ICD-10-CM | POA: Diagnosis not present

## 2018-10-06 DIAGNOSIS — J329 Chronic sinusitis, unspecified: Secondary | ICD-10-CM

## 2018-10-06 MED ORDER — AZITHROMYCIN 250 MG PO TABS: ORAL_TABLET | ORAL | 0 refills | Status: DC

## 2018-10-06 MED ORDER — IPRATROPIUM BROMIDE 0.06 % NA SOLN: 2.0000 | Freq: Four times a day (QID) | NASAL | 0 refills | Status: DC

## 2018-10-06 MED ORDER — METHYLPREDNISOLONE ACETATE 80 MG/ML IJ SUSP
80.0000 mg | Freq: Once | INTRAMUSCULAR | Status: AC
  Administered 2018-10-06: 80 mg via INTRAMUSCULAR

## 2018-10-06 NOTE — Patient Instructions (Signed)
Start the atrovent.  We will give you an injection of an anti-inflammatory steroid today.   Start the zpack if your symptoms worsen or do not improve in a few days.  Please stay well hydrated.  You can take tylenol as needed for low grade fever and pain.  Please let me know if your symptoms worsen or fail to improve.  Take care, Dr Jerline Pain

## 2018-10-06 NOTE — Progress Notes (Signed)
   Subjective:  Rodney Scott is a 67 y.o. male who presents today for same-day appointment with a chief complaint of cough.   HPI:  Cough, Acute problem Started 2 days ago. Worsened over that time.  Associated with ear pain/pressure, body aches, and rhinorrhea.  His grandson has been sick with similar symptoms.  Tried Sudafed last night with modest improvement in his symptoms.  No fevers.  No chills.  No other treatments tried.  No other obvious alleviating or aggravating factors.  ROS: Per HPI  PMH: He reports that he has been smoking cigarettes. He has a 11.25 pack-year smoking history. He has never used smokeless tobacco. He reports current alcohol use of about 20.0 - 22.0 standard drinks of alcohol per week. He reports that he does not use drugs.  Objective:  Physical Exam: BP 124/80 (BP Location: Left Arm, Patient Position: Sitting, Cuff Size: Large)   Pulse (!) 103   Temp 98.5 F (36.9 C) (Oral)   Ht 6\' 3"  (1.905 m)   Wt 230 lb 12 oz (104.7 kg)   SpO2 99%   BMI 28.84 kg/m   Gen: NAD, resting comfortably TMs with clear effusion bilaterally.  Nose mucosa erythematous and boggy bilaterally.  OP slightly erythematous with no exudate. CV: RRR with no murmurs appreciated Pulm: NWOB, CTAB with no crackles, wheezes, or rhonchi  Assessment/Plan:  Cough Likely secondary to viral URI. No signs of bacterial infection. Start atrovent for rhinorrhea/sinus congestion. Will give 80mg  IM depo-medrol. Sent in a "pocket prescription" for azithromycin with strict instruction to not start unless symptoms worsen or fail to improve within the next several days. Recommended tylenol as needed for low grade fever and pain. Encouraged good oral hydration. Return precautions reviewed. Follow up as needed.   Algis Greenhouse. Jerline Pain, MD 10/06/2018 11:42 AM

## 2018-10-15 ENCOUNTER — Other Ambulatory Visit: Payer: Self-pay

## 2018-10-19 ENCOUNTER — Other Ambulatory Visit: Payer: Self-pay | Admitting: Neurology

## 2018-10-19 ENCOUNTER — Ambulatory Visit
Admission: RE | Admit: 2018-10-19 | Discharge: 2018-10-19 | Disposition: A | Discharge status: Home / Self Care | Payer: Medicare HMO | Source: Ambulatory Visit | Attending: Neurology | Admitting: Neurology

## 2018-10-19 DIAGNOSIS — M795 Residual foreign body in soft tissue: Secondary | ICD-10-CM

## 2018-10-19 DIAGNOSIS — R413 Other amnesia: Secondary | ICD-10-CM | POA: Diagnosis not present

## 2018-10-19 DIAGNOSIS — Z01818 Encounter for other preprocedural examination: Secondary | ICD-10-CM | POA: Diagnosis not present

## 2018-10-19 DIAGNOSIS — Z136 Encounter for screening for cardiovascular disorders: Secondary | ICD-10-CM | POA: Diagnosis not present

## 2018-10-19 MED ORDER — GADOBENATE DIMEGLUMINE 529 MG/ML IV SOLN
20.0000 mL | Freq: Once | INTRAVENOUS | Status: AC | PRN
  Administered 2018-10-19: 20 mL via INTRAVENOUS

## 2018-10-19 NOTE — Progress Notes (Signed)
Received fax from [facility] stating that pt has the following appointments with Dr. Loni Beckwith:  10/26/2018 @ 11am - diagnostic clinic interview 10/26/2018 @ 1pm - Neuropsychological testing  11/12/2018 @ 10am - Feedback of results.   Fax requested that I provide last (3) clinic notes and CT/MRI report (if available) pt only seen once in office; 09/25/2018.  Notes printed and faxed to [phone].  Pt scheduled for MRI brain W&WO contrast later today at [facility].

## 2018-10-26 DIAGNOSIS — F39 Unspecified mood [affective] disorder: Secondary | ICD-10-CM | POA: Diagnosis not present

## 2018-10-26 DIAGNOSIS — R413 Other amnesia: Secondary | ICD-10-CM | POA: Diagnosis not present

## 2018-10-26 DIAGNOSIS — G3184 Mild cognitive impairment, so stated: Secondary | ICD-10-CM | POA: Diagnosis not present

## 2018-11-12 ENCOUNTER — Other Ambulatory Visit: Payer: Self-pay | Admitting: Family Medicine

## 2018-11-12 DIAGNOSIS — G3184 Mild cognitive impairment, so stated: Secondary | ICD-10-CM | POA: Diagnosis not present

## 2018-11-12 DIAGNOSIS — I69914 Frontal lobe and executive function deficit following unspecified cerebrovascular disease: Secondary | ICD-10-CM | POA: Diagnosis not present

## 2018-11-12 DIAGNOSIS — F39 Unspecified mood [affective] disorder: Secondary | ICD-10-CM | POA: Diagnosis not present

## 2018-11-12 DIAGNOSIS — R413 Other amnesia: Secondary | ICD-10-CM | POA: Diagnosis not present

## 2018-12-14 ENCOUNTER — Ambulatory Visit: Payer: Medicare HMO | Admitting: Neurology

## 2018-12-14 ENCOUNTER — Encounter: Payer: Self-pay | Admitting: Neurology

## 2018-12-14 ENCOUNTER — Other Ambulatory Visit: Payer: Self-pay

## 2018-12-14 VITALS — BP 112/66 | HR 81 | Ht 76.0 in | Wt 238.0 lb

## 2018-12-14 DIAGNOSIS — G3184 Mild cognitive impairment, so stated: Secondary | ICD-10-CM | POA: Diagnosis not present

## 2018-12-14 MED ORDER — DONEPEZIL HCL 5 MG PO TABS: ORAL_TABLET | ORAL | 3 refills | Status: DC

## 2018-12-14 NOTE — Patient Instructions (Signed)
1. Continue Donepezil 5mg  daily 2. Follow-up in 6 months or so, call for any changes   RECOMMENDATIONS FOR ALL PATIENTS WITH MEMORY PROBLEMS: 1. Continue to exercise (Recommend 30 minutes of walking everyday, or 3 hours every week) 2. Increase social interactions - continue going to [place] and enjoy social gatherings with friends and family 3. Eat healthy, avoid fried foods and eat more fruits and vegetables 4. Maintain adequate blood pressure, blood sugar, and blood cholesterol level. Reducing the risk of stroke and cardiovascular disease also helps promoting better memory. 5. Avoid stressful situations. Live a simple life and avoid aggravations. Organize your time and prepare for the next day in anticipation. 6. Sleep well, avoid any interruptions of sleep and avoid any distractions in the bedroom that may interfere with adequate sleep quality 7. Avoid sugar, avoid sweets as there is a strong link between excessive sugar intake, diabetes, and cognitive impairment The Mediterranean diet has been shown to help patients reduce the risk of progressive memory disorders and reduces cardiovascular risk. This includes eating fish, eat fruits and green leafy vegetables, nuts like almonds and hazelnuts, walnuts, and also use olive oil. Avoid fast foods and fried foods as much as possible. Avoid sweets and sugar as sugar use has been linked to worsening of memory function.  There is always a concern of gradual progression of memory problems. If this is the case, then we may need to adjust level of care according to patient needs. Support, both to the patient and caregiver, should then be put into place.

## 2018-12-14 NOTE — Progress Notes (Signed)
NEUROLOGY FOLLOW UP OFFICE NOTE  Rodney Scott [id number] 10/06/1951  HISTORY OF PRESENT ILLNESS: I had the pleasure of seeing Rodney Scott in follow-up in the neurology clinic on 12/14/2018.  The patient was last seen 3 months ago for mild cognitive impairment. He is alone in the office today. Records and images were personally reviewed where available. MOCA in December 2019 was 25/30. He had an MRI brain with and without contrast in January 2019 which I personally reviewed, no acute changes. There was mild diffuse atrophy, mild to moderate chronic microvascular disease. He underwent Neuropsychological testing at [facility] in January 2020 with results indicating very mild behaviorally observed expressive speech difficulty and select executive dysfunction, diagnosis of unspecified Mild Cognitive Impairment (possibly vascular), unspecified depressive disorder. It was noted that his only language deficit included behaviorally observed occasional use of filler words and difficulty verbally describing some experiences. All objective language testing was within normal limits, no expressive language order at this time. He was started on Donepezil 5mg  daily which he is tolerating without difficulties.  He is again noted to have difficulty finding words, saying "you know" several times in the visit. He denies getting lost driving but uses GPS more than he used to. 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 He rarely misses medications. He reports that he has stopped one of his depression medications. He states "I'm forgetful not depressed." He denies any new issues with caring for his grandchild, which sparked the initial referral. He states he forgets "stupid little things." No headaches, dizziness, vision changes, no falls.   History on Initial Assessment 09/25/2018: This is a 68 year old right-handed man with a history of hypertension, hyperlipidemia, Factor V Leiden  deficiency, DVT on Xarelto, presenting for evaluation of worsening memory. He and his wife started noticing changes over the past year. He states long-term memory is much better than remembering what he did yesterday. He misplaces things frequently at home, they have lived in the same house for 30 years and his wife has to search for things that have always been in the same place for years. He denies getting lost driving but uses his GPS a lot, he cannot remember the way to places like before. He procrastinates on bills but generally does not miss bills, his wife nods that he has. He occasionally forgets his medications. 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 His wife's major concern is how this has affected caring for their 17 year old granddaughter who has been living with them for the past 5 years. He forgets to feed her, which is new. He goes to a store and forgets what he is doing, she has to give him a pretty detailed note so he can remember events for the day. She has noticed his language skills have decreased markedly, he is not a very verbal person, but she has noticed more difficulty expressing his thoughts or finding words. He may have a thought but stumbles over the words. He repeats himself, one time last summer he told the same story 3-4 times within half an hour. He has always been easily frustrated, but she feels it is worse, such as when driving, "no one knows what they are doing." No paranoia or hallucinations.   He denies any headaches, dizziness, vision changes, neck/back pain, focal numbness/tingling/weakness, bowel/bladder dysfunction, or tremors. He has lost his sense of smell over the past year for no clear  reason. He denies any head injuries. His mother had dementia in her 36s. 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 When asked about mood, he states "I don't know." His wife does not think he is depressed, she is unsure why he is still taking the Wellbutrin and Pristiq, she feels Wellbutrin was started 20 years ago for smoking cessation.   PAST MEDICAL HISTORY: Past Medical History:  Diagnosis Date  . Allergic rhinitis   . Allergy   . Clotting disorder ([place])   . DVT (deep venous thrombosis) ([place])   . Factor V deficiency ([place])   . Hyperlipidemia   . Hypertension   . Internal hemorrhoid 11/24/2009   No bleeding. Patient states thought was external.     . Osteoarthritis   . Right rotator cuff tear 12/07/2010   S/p surgery.    . Tubular adenoma of colon 11/2006    MEDICATIONS: Current Outpatient Medications on File Prior to Visit  Medication Sig Dispense Refill  . benazepril (LOTENSIN) 20 MG tablet TAKE 1 TABLET BY MOUTH ONCE DAILY 90 tablet 2  . buPROPion (WELLBUTRIN XL) 150 MG 24 hr tablet Take 1 tablet (150 mg total) by mouth daily. 90 tablet 3  . desvenlafaxine (PRISTIQ) 50 MG 24 hr tablet TAKE 1 TABLET BY MOUTH ONCE DAILY 90 tablet 3  . donepezil (ARICEPT) 5 MG tablet Take 1/2 tablet daily for 2 weeks, then increase to 1 tablet daily 30 tablet 11  . Loratadine (CLARITIN) 10 MG CAPS Take by mouth daily.    . rosuvastatin (CRESTOR) 40 MG tablet Take 1 tablet (40 mg total) by mouth daily. 90 tablet 3  . XARELTO 20 MG TABS tablet TAKE 1 TABLET BY MOUTH ONCE DAILY 90 tablet 3   No current facility-administered medications on file prior to visit.     ALLERGIES: Allergies  Allergen Reactions  . Yellow Jacket Venom [Bee Venom] Shortness Of Breath    Had swelling localized, chest tightness, and shortness of breath  . Cephalexin     REACTION: solar rash    FAMILY HISTORY: Family History  Problem Relation Age of Onset  . Colon cancer Father 53  . Factor V Leiden deficiency Son   . Esophageal cancer Neg Hx   . Rectal cancer Neg Hx   . Stomach cancer Neg Hx      SOCIAL HISTORY: Social History   Socioeconomic History  . Marital status: Married    Spouse name: Not on file  . Number of children: Not on file  . Years of education: Not on file  . Highest education level: Not on file  Occupational History  . Not on file  Social Needs  . Financial resource strain: Not on file  . Food insecurity:    Worry: Not on file    Inability: Not on file  . Transportation needs:    Medical: Not on file    Non-medical: Not on file  Tobacco Use  . Smoking status: Current Some Day Smoker    Packs/day: 0.25    Years: 45.00    Pack years: 11.25    Types: Cigarettes    Last attempt to quit: 11/06/2014    Years since quitting: 4.1  . Smokeless tobacco: Never Used  Substance and Sexual Activity  . Alcohol use: Yes  Alcohol/week: 20.0 - 22.0 standard drinks    Types: 14 Cans of beer, 6 - 8 Standard drinks or equivalent per week  . Drug use: No  . Sexual activity: Yes  Lifestyle  . Physical activity:    Days per week: Not on file    Minutes per session: Not on file  . Stress: Not on file  Relationships  . Social connections:    Talks on phone: Not on file    Gets together: Not on file    Attends religious service: Not on file    Active member of club or organization: Not on file    Attends meetings of clubs or organizations: Not on file    Relationship status: Not on file  . Intimate partner violence:    Fear of current or ex partner: Not on file    Emotionally abused: Not on file    Physically abused: Not on file    Forced sexual activity: Not on file  Other Topics Concern  . Not on file  Social History Narrative   Married (wife patient of Dr. Yong Channel). Son and daughter. 1 granddaughter.       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: time around house, time with family, yardwork, some [place] in 2 story home with wife and  granddaughter, [employer] ge[employer] school graduate    REVIEW OF SYSTEMS: Constitutional: No fevers, chills, or sweats, no generalized fatigue, change in appetite Eyes: No visual changes, double vision, eye pain Ear, nose and throat: No hearing loss, ear pain, nasal congestion, sore throat Cardiovascular: No chest pain, palpitations Respiratory:  No shortness of breath at rest or with exertion, wheezes GastrointestinaI: No nausea, vomiting, diarrhea, abdominal pain, fecal incontinence Genitourinary:  No dysuria, urinary retention or frequency Musculoskeletal:  No neck pain, back pain Integumentary: No rash, pruritus, skin lesions Neurological: as above Psychiatric: No depression, insomnia, anxiety Endocrine: No palpitations, fatigue, diaphoresis, mood swings, change in appetite, change in weight, increased thirst Hematologic/Lymphatic:  No anemia, purpura, petechiae. Allergic/Immunologic: no itchy/runny eyes, nasal congestion, recent allergic reactions, rashes  PHYSICAL EXAM: Vitals:   12/14/18 1401  BP: 112/66  Pulse: 81  SpO2: 97%   General: No acute distress Head:  Normocephalic/atraumatic Neck: supple, no paraspinal tenderness, full range of motion Heart:  Regular rate and rhythm Lungs:  Clear to auscultation bilaterally Back: No paraspinal tenderness Skin/Extremities: No rash, no edema Neurological Exam: alert and oriented to person, place, and time. No aphasia or dysarthria but again noted to have some word-finding difficulty, saying "you know" frequently (similar to prior). Fund of knowledge is appropriate.  Remote memory intact.  Attention and concentration are normal.    Able to name objects and repeat phrases.  Montreal Cognitive Assessment  12/14/2018 09/25/2018  Visuospatial/ Executive (0/5) 4 5  Naming (0/3) 3 3  Attention: Read list of digits (0/2) 2 2  Attention: Read list of letters (0/1) 1 1  Attention: Serial 7 subtraction starting at 100 (0/3) 3 3  Language: Repeat  phrase (0/2) 2 2  Language : Fluency (0/1) 1 1  Abstraction (0/2) 2 2  Delayed Recall (0/5) 0 0  Orientation (0/6) 6 6  Total 24 25  Adjusted Score (based on education) 25 -   Cranial nerves: Pupils equal, round, reactive to light.  Extraocular movements intact with no nystagmus. Visual fields  full. Facial sensation intact. No facial asymmetry. Tongue, uvula, palate midline.  Motor: Bulk and tone normal, muscle strength 5/5 throughout with no pronator drift.  Sensation to light touch intact.  No extinction to double simultaneous stimulation.  Deep tendon reflexes 2+ throughout, toes downgoing.  Finger to nose testing intact.  Gait narrow-based and steady, able to tandem walk adequately.  Romberg negative.  IMPRESSION: This is a 68 yo RH man with a history of  hypertension, hyperlipidemia, Factor V Leiden deficiency, DVT on Xarelto, with mild cognitive impairment (possibly vascular). He is again noted to have some word-finding difficulty, however recent Neuropsychological testing showed all objective language testing within normal limits. Cognitive profile indicated select executive dysfunction and variable cognitive efficiency, mildly frontal-subcortical pattern possibly vascular in etiology. We discussed findings, including MRI brain showing mild to moderate chronic microvascular disease. Continue Donepezil 5mg  daily. We again discussed the importance of control of vascular risk factors, physical exercise, and brain stimulation exercises for brain health. He will follow-up in 6 months and knows to call for any changes.   Thank you for allowing me to participate in his care.  Please do not hesitate to call for any questions or concerns.  The duration of this appointment visit was 30 minutes of face-to-face time with the patient.  Greater than 50% of this time was spent in counseling, explanation of diagnosis, planning of further management, and coordination of care.   [name], M.D.   CC:  Dr. Yong Channel

## 2018-12-17 ENCOUNTER — Telehealth: Payer: Self-pay | Admitting: Family Medicine

## 2018-12-17 MED ORDER — OSELTAMIVIR PHOSPHATE 75 MG PO CAPS: 75.0000 mg | ORAL_CAPSULE | Freq: Every day | ORAL | 0 refills | Status: DC

## 2018-12-17 NOTE — Telephone Encounter (Signed)
I sent in tamiflu after getting message back form daughter- please inform patient

## 2018-12-17 NOTE — Telephone Encounter (Signed)
Please advise 

## 2018-12-17 NOTE — Telephone Encounter (Signed)
I would only plan treatment with Tamiflu if they are going to continue to watch the son at this point-if not treatment only if become ill.  How often were they with him?  When was the last exposure?

## 2018-12-17 NOTE — Telephone Encounter (Addendum)
Copied from [place] [id number]. Topic: General - Other >> Dec 17, 2018  2:33 PM Lennox Solders wrote: Reason for CRM: pt daughter stephanie who is also dr hunter patient  is calling and her son was dx with flu type A today and daughter would like to know if dr hunter would call in tamiflu for her father he is not having any symptoms . [facility] in [place]. Dad sometime keeps her son

## 2018-12-17 NOTE — Telephone Encounter (Signed)
See note

## 2018-12-19 ENCOUNTER — Other Ambulatory Visit: Payer: Self-pay | Admitting: Family Medicine

## 2019-03-12 ENCOUNTER — Other Ambulatory Visit: Payer: Self-pay

## 2019-03-12 ENCOUNTER — Encounter: Payer: Self-pay | Admitting: Family Medicine

## 2019-03-12 ENCOUNTER — Ambulatory Visit (INDEPENDENT_AMBULATORY_CARE_PROVIDER_SITE_OTHER): Payer: Medicare HMO | Admitting: Family Medicine

## 2019-03-12 VITALS — BP 108/73 | HR 80 | Temp 98.6°F | Ht 74.5 in | Wt 230.8 lb

## 2019-03-12 DIAGNOSIS — Z0000 Encounter for general adult medical examination without abnormal findings: Secondary | ICD-10-CM

## 2019-03-12 DIAGNOSIS — R739 Hyperglycemia, unspecified: Secondary | ICD-10-CM

## 2019-03-12 DIAGNOSIS — D6851 Activated protein C resistance: Secondary | ICD-10-CM | POA: Diagnosis not present

## 2019-03-12 DIAGNOSIS — F17200 Nicotine dependence, unspecified, uncomplicated: Secondary | ICD-10-CM

## 2019-03-12 DIAGNOSIS — F3342 Major depressive disorder, recurrent, in full remission: Secondary | ICD-10-CM

## 2019-03-12 DIAGNOSIS — E663 Overweight: Secondary | ICD-10-CM

## 2019-03-12 DIAGNOSIS — K4090 Unilateral inguinal hernia, without obstruction or gangrene, not specified as recurrent: Secondary | ICD-10-CM

## 2019-03-12 DIAGNOSIS — E785 Hyperlipidemia, unspecified: Secondary | ICD-10-CM | POA: Diagnosis not present

## 2019-03-12 DIAGNOSIS — Z125 Encounter for screening for malignant neoplasm of prostate: Secondary | ICD-10-CM

## 2019-03-12 DIAGNOSIS — G3184 Mild cognitive impairment, so stated: Secondary | ICD-10-CM

## 2019-03-12 LAB — COMPREHENSIVE METABOLIC PANEL
ALT: 23 U/L (ref 0–53)
AST: 17 U/L (ref 0–37)
Albumin: 3.9 g/dL (ref 3.5–5.2)
Alkaline Phosphatase: 58 U/L (ref 39–117)
BUN: 21 mg/dL (ref 6–23)
CO2: 28 mEq/L (ref 19–32)
Calcium: 9.2 mg/dL (ref 8.4–10.5)
Chloride: 106 mEq/L (ref 96–112)
Creatinine, Ser: 0.96 mg/dL (ref 0.40–1.50)
GFR: 78.08 mL/min (ref >60.00)
Glucose, Bld: 79 mg/dL (ref 70–99)
Potassium: 4.7 mEq/L (ref 3.5–5.1)
Sodium: 141 mEq/L (ref 135–145)
Total Bilirubin: 0.4 mg/dL (ref 0.2–1.2)
Total Protein: 6 g/dL (ref 6.0–8.3)

## 2019-03-12 LAB — POC URINALSYSI DIPSTICK (AUTOMATED)
Bilirubin, UA: NEGATIVE
Blood, UA: NEGATIVE
Glucose, UA: NEGATIVE
Ketones, UA: NEGATIVE
Leukocytes, UA: NEGATIVE
Nitrite, UA: NEGATIVE
Protein, UA: NEGATIVE
Spec Grav, UA: >=1.03 — AB (ref 1.010–1.025)
Urobilinogen, UA: 0.2 E.U./dL
pH, UA: 6 (ref 5.0–8.0)

## 2019-03-12 LAB — LIPID PANEL
Cholesterol: 172 mg/dL (ref 0–200)
HDL: 48.6 mg/dL (ref >39.00)
LDL Cholesterol: 96 mg/dL (ref 0–99)
NonHDL: 122.93
Total CHOL/HDL Ratio: 4
Triglycerides: 134 mg/dL (ref 0.0–149.0)
VLDL: 26.8 mg/dL (ref 0.0–40.0)

## 2019-03-12 LAB — CBC
HCT: 44 % (ref 39.0–52.0)
Hemoglobin: 15.4 g/dL (ref 13.0–17.0)
MCHC: 34.9 g/dL (ref 30.0–36.0)
MCV: 95.5 fl (ref 78.0–100.0)
Platelets: 201 10*3/uL (ref 150.0–400.0)
RBC: 4.6 Mil/uL (ref 4.22–5.81)
RDW: 13.1 % (ref 11.5–15.5)
WBC: 7.4 10*3/uL (ref 4.0–10.5)

## 2019-03-12 LAB — HEMOGLOBIN A1C: Hgb A1c MFr Bld: 6 % (ref 4.6–6.5)

## 2019-03-12 LAB — PSA: PSA: 0.4 ng/mL (ref 0.10–4.00)

## 2019-03-12 NOTE — Assessment & Plan Note (Addendum)
Noted again. Once again- Declines surgery referral- minimally bothering him. Can call in if he changes his mind. Discussed emergent precautions

## 2019-03-12 NOTE — Assessment & Plan Note (Signed)
Has complained of memory loss in past. MMSE 29/30 last visit. Recheck at [facility] each year. He states about the same -MMSE improved to 30/12 September 2018- I referred to neurology due to short term memory loss issues - saw Dr. Delice Lesch march 2020 diagnosed MCI - on aricept now with 6 month follow up planned - memory issues concern him like stating he forgot today was his anniversary

## 2019-03-12 NOTE — Addendum Note (Signed)
Addended by: Tomi Likens on: 03/12/2019 09:03 AM   Modules accepted: Orders

## 2019-03-12 NOTE — Patient Instructions (Addendum)
Please stop by lab before you go If you do not have mychart- we will call you about results within 5 business days of [place] receiving them.  If you have mychart- we will send your results within 3 business days of [place] receiving them.  If abnormal or we want to clarify a result, we will call or mychart you to make sure you receive the message.  If you have questions or concerns or don't hear within 5-7 days, please send [place] a message or call us.    STRONGLY encourage you to quit smoking- one of the best things you can do for your health

## 2019-03-12 NOTE — Progress Notes (Signed)
Phone: [phone]   Subjective:  Patient presents today for their annual physical. Chief complaint-noted.   See problem oriented charting- ROS- full  review of systems was completed and negative except for: memory issues   The following were reviewed and entered/updated in epic: Past Medical History:  Diagnosis Date  . Allergic rhinitis   . Allergy   . Clotting disorder ([place])   . DVT (deep venous thrombosis) ([place])   . Factor V deficiency ([place])   . Hyperlipidemia   . Hypertension   . Internal hemorrhoid 11/24/2009   No bleeding. Patient states thought was external.     . Osteoarthritis   . Right rotator cuff tear 12/07/2010   S/p surgery.    . Tubular adenoma of colon 11/2006   Patient Active Problem List   Diagnosis Date Noted  . MCI (mild cognitive impairment) 06/15/2015    Priority: High  . Current smoker 07/26/2014    Priority: High  . Factor V Leiden ([place]) 07/31/2007    Priority: High  . DVT, HX OF 07/31/2007    Priority: High  . BPH associated with nocturia 03/10/2018    Priority: Medium  . Hyperglycemia 12/05/2016    Priority: Medium  . Essential hypertension, benign 07/26/2014    Priority: Medium  . Depression, major, recurrent, in complete remission ([place]) 03/24/2009    Priority: Medium  . Hyperlipidemia 07/31/2007    Priority: Medium  . Left inguinal hernia 12/05/2016    Priority: Low  . 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 Osteoarthritis 07/15/2008    Priority: Low  . Venous (peripheral) insufficiency 12/11/2007    Priority: Low  . Allergic rhinitis 07/31/2007    Priority: Low  . History of colonic polyps 07/31/2007    Priority: Low   Past Surgical History:  Procedure Laterality Date  . right rotator cuff repair    . SALIVARY STONE REMOVAL      Family History  Problem Relation Age of Onset  . Colon cancer Father 69  . Factor V Leiden deficiency Son   . Esophageal cancer Neg Hx   . Rectal cancer Neg Hx   .  Stomach cancer Neg Hx     Medications- reviewed and updated Current Outpatient Medications  Medication Sig Dispense Refill  . benazepril (LOTENSIN) 20 MG tablet Take 1 tablet by mouth once daily 90 tablet 0  . buPROPion (WELLBUTRIN XL) 150 MG 24 hr tablet Take 1 tablet (150 mg total) by mouth daily. 90 tablet 3  . desvenlafaxine (PRISTIQ) 50 MG 24 hr tablet TAKE 1 TABLET BY MOUTH ONCE DAILY 90 tablet 3  . donepezil (ARICEPT) 5 MG tablet Take 1 tablet daily 90 tablet 3  . Loratadine (CLARITIN) 10 MG CAPS Take by mouth daily.    . rosuvastatin (CRESTOR) 40 MG tablet Take 1 tablet (40 mg total) by mouth daily. 90 tablet 3  . XARELTO 20 MG TABS tablet TAKE 1 TABLET BY MOUTH ONCE DAILY 90 tablet 3   No current facility-administered medications for this visit.     Allergies-reviewed and updated Allergies  Allergen Reactions  . Yellow Jacket Venom [Bee Venom] Shortness Of Breath    Had swelling localized, chest tightness, and shortness of breath  . Cephalexin     REACTION: solar rash    Social History   Social History Narrative   Married (wife patient of Dr. Yong Channel). Son and daughter. 1 granddaughter.       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 Oropharynx normal Neck: no thyromegaly CV: RRR no murmurs rubs or gallops Lungs: CTAB no crackles, wheeze, rhonchi Abdomen: soft/nontender/nondistended/normal bowel sounds. No rebound or guarding.  Ext: no edema Skin: warm, dry Neuro: grossly normal, moves all  extremities, PERRLA GU: left inguinal hernia noted   Assessment and Plan  68 y.o. male presenting for annual physical.  Health Maintenance counseling: 1. Anticipatory guidance: Patient counseled regarding regular dental exams -q4 months, eye exams -yearly or every other year advised (last year had been a few years),  avoiding smoking and second hand smoke , limiting alcohol to 2 beverages per day .   2. Risk factor reduction:  Advised patient of need for regular exercise and diet rich and fruits and vegetables to reduce risk of heart attack and stroke. Exercise- advised 150 minutes a week- he states trying to exercise and remains active around the house- rain has been a setback. Diet-reasonably healthy- down 8 lbs from last visit so has been doing a good job.  Wt Readings from Last 3 Encounters:  03/12/19 230 lb 12.8 oz (104.7 kg)  12/14/18 238 lb (108 kg)  10/06/18 230 lb 12 oz (104.7 kg)  3. Immunizations/screenings/ancillary studies- would defer shingrix due to covid 19 pandemic and potential overlap of SE and covid 19  Immunization History  Administered Date(s) Administered  . Influenza Split 07/26/2011, 08/07/2012  . Influenza Whole 07/31/2007, 07/15/2008, 07/07/2009, 06/29/2010  . Influenza, High Dose Seasonal PF 09/08/2017, 07/15/2018  . Influenza,inj,Quad PF,6+ Mos 08/20/2013, 07/26/2014, 06/15/2015, 07/05/2016  . Pneumococcal Conjugate-13 06/05/2017  . Pneumococcal Polysaccharide-23 06/22/2010, 07/15/2018  . Td 10/14/2001  . Tdap 01/03/2012  4. Prostate cancer screening-   Will continue to trend psa, low risk trend- will defer rectal. Mild BPH on past exams. Nocturia 1-2x a night. psa through age 75 Lab Results  Component Value Date   PSA 0.40 03/20/2018   PSA 0.39 11/28/2016   PSA 0.35 11/10/2015   5. Colon cancer screening - 09/2018 with 3 year follow up due to adenoma history  6. Skin cancer screening- no dermatologist. advised regular sunscreen use. Denies worrisome,  changing, or new skin lesions.  3. 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 Discussed importance of weight loss In light of being overweight Lab Results  Component Value Date   HGBA1C 5.8 03/20/2018   HTN- controlled on benazepril 20mg    HLD- controlled with LDL under 100 on crestor 40mg  daily   #Depression- remains controlled on pristiq a50 mgnd wellbutrin 150mg  XR  #smoker- 3-5 per day. Encouraged cessation- hes not ready to quit.   Future Appointments  Date Time Provider [place]  04/06/2019  8:30 AM Cameron Sprang, MD [facility] None  07/19/2019  4:00 PM Cameron Sprang, MD [facility] None  10/20/2019  1:00 PM [facility] HEALTH COACH [facility]   6 month check in Lab/Order associations: eggs and toast Preventative health care - Plan: CBC, Comprehensive metabolic panel, Lipid panel, PSA, Hemoglobin A1c, POCT Urinalysis Dipstick (Automated)  Overweight  Hyperglycemia - Plan: Hemoglobin A1c, POCT Urinalysis Dipstick (Automated)  Depression, major, recurrent, in complete remission (HCC), Chronic  Factor V Leiden ([place]) - Plan: CBC, Comprehensive metabolic panel  Hyperlipidemia, unspecified hyperlipidemia type - Plan: CBC, Comprehensive metabolic panel, Lipid panel  Screening for prostate cancer - Plan: PSA  Left inguinal hernia  MCI (mild cognitive impairment)  Current smoker - Plan: POCT Urinalysis Dipstick (Automated)  Return precautions advised.  [name], MD

## 2019-03-20 ENCOUNTER — Other Ambulatory Visit: Payer: Self-pay | Admitting: Family Medicine

## 2019-04-06 ENCOUNTER — Ambulatory Visit: Payer: Self-pay | Admitting: Neurology

## 2019-04-07 ENCOUNTER — Other Ambulatory Visit: Payer: Self-pay

## 2019-04-09 ENCOUNTER — Other Ambulatory Visit: Payer: Self-pay

## 2019-04-09 ENCOUNTER — Encounter: Payer: Self-pay | Admitting: Neurology

## 2019-04-09 ENCOUNTER — Ambulatory Visit (INDEPENDENT_AMBULATORY_CARE_PROVIDER_SITE_OTHER): Payer: Medicare HMO | Admitting: Neurology

## 2019-04-09 VITALS — BP 128/79 | HR 85 | Temp 97.3°F | Ht 75.0 in | Wt 233.2 lb

## 2019-04-09 DIAGNOSIS — G3184 Mild cognitive impairment, so stated: Secondary | ICD-10-CM | POA: Diagnosis not present

## 2019-04-09 MED ORDER — DONEPEZIL HCL 5 MG PO TABS: ORAL_TABLET | ORAL | 3 refills | Status: DC

## 2019-04-09 NOTE — Progress Notes (Signed)
NEUROLOGY FOLLOW UP OFFICE NOTE  Rodney Scott [id number] [birth year]  HISTORY OF PRESENT ILLNESS: I had the pleasure of seeing Rodney Scott in follow-up in the neurology clinic on 04/09/2019.  The patient was last seen 3 months ago for mild cognitive impairment. He is alone in the office today. [place] 25/30 in March 2020 (25/30 in December 2019. MRI brain with and without contrast in January 2019 no acute changes. There was mild diffuse atrophy, mild to moderate chronic microvascular disease. He underwent Neuropsychological testing at [facility] in January 2020 with results indicating very mild behaviorally observed expressive speech difficulty and select executive dysfunction, diagnosis of unspecified Mild Cognitive Impairment (possibly vascular), unspecified depressive disorder. It was noted that his only language deficit included behaviorally observed occasional use of filler words and difficulty verbally describing some experiences. All objective language testing was within normal limits, no expressive language order at this time. He was started on Donepezil 5mg  daily which he is tolerating without difficulties.  He feels his memory is okay, his family has not mentioned any concerns. He denies getting lost driving. He denies missing bills, he occasionally forgets his night medications. He is again noted to have the same speech pattern as previous visits, saying "you know" several times. He denies any headaches, dizziness, vision changes, falls. His feet feel itchy at night.   History on Initial Assessment 09/25/2018: This is a 68 year old right-handed man with a history of hypertension, hyperlipidemia, Factor V Leiden deficiency, DVT on Xarelto, presenting for evaluation of worsening memory. He and his wife started noticing changes over the past year. He states long-term memory is much better than remembering what he did yesterday. He misplaces things frequently at home, they have lived in the same  house for 30 years and his wife has to search for things that have always been in the same place for years. He denies getting lost driving but uses his GPS a lot, he cannot remember the way to places like before. He procrastinates on bills but generally does not miss bills, his wife nods that he has. He occasionally forgets his medications. 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 His wife's major concern is how this has affected caring for their 66 year old granddaughter who has been living with them for the past 5 years. He forgets to feed her, which is new. He goes to a store and forgets what he is doing, she has to give him a pretty detailed note so he can remember events for the day. She has noticed his language skills have decreased markedly, he is not a very verbal person, but she has noticed more difficulty expressing his thoughts or finding words. He may have a thought but stumbles over the words. He repeats himself, one time last summer he told the same story 3-4 times within half an hour. He has always been easily frustrated, but she feels it is worse, such as when driving, "no one knows what they are doing." No paranoia or hallucinations.   He denies any headaches, dizziness, vision changes, neck/back pain, focal numbness/tingling/weakness, bowel/bladder dysfunction, or tremors. He has lost his sense of smell over the past year for no clear reason. He denies any head injuries. His mother had dementia in her 25s. 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 When asked about mood, he states "I don't know." His wife does not think he is depressed, she is unsure why he is still taking the Wellbutrin and Pristiq, she feels Wellbutrin was started 20 years ago for smoking cessation.   PAST MEDICAL  HISTORY: Past Medical History:  Diagnosis Date   Allergic rhinitis    Allergy    Clotting disorder ([place])    DVT (deep venous thrombosis) ([place])    Factor V deficiency ([place])    Hyperlipidemia    Hypertension    Internal hemorrhoid 11/24/2009   No bleeding. Patient states thought was external.      Osteoarthritis    Right rotator cuff tear 12/07/2010   S/p surgery.     Tubular adenoma of colon 11/2006    MEDICATIONS: Current Outpatient Medications on File Prior to Visit  Medication Sig Dispense Refill   benazepril (LOTENSIN) 20 MG tablet Take 1 tablet by mouth once daily 90 tablet 0   buPROPion (WELLBUTRIN XL) 150 MG 24 hr tablet Take 1 tablet (150 mg total) by mouth daily. 90 tablet 3   desvenlafaxine (PRISTIQ) 50 MG 24 hr tablet TAKE 1 TABLET BY MOUTH ONCE DAILY 90 tablet 3   donepezil (ARICEPT) 5 MG tablet Take 1 tablet daily 90 tablet 3   Loratadine (CLARITIN) 10 MG CAPS Take by mouth daily.     rosuvastatin (CRESTOR) 40 MG tablet Take 1 tablet (40 mg total) by mouth daily. 90 tablet 3   XARELTO 20 MG TABS tablet TAKE 1 TABLET BY MOUTH ONCE DAILY 90 tablet 3   No current facility-administered medications on file prior to visit.     ALLERGIES: Allergies  Allergen Reactions   Yellow Jacket Venom [Bee Venom] Shortness Of Breath    Had swelling localized, chest tightness, and shortness of breath   Cephalexin     REACTION: solar rash    FAMILY HISTORY: Family History  Problem Relation Age of Onset   Colon cancer Father 20   Factor V Leiden deficiency Son    Esophageal cancer Neg Hx    Rectal cancer Neg Hx    Stomach cancer Neg Hx     SOCIAL HISTORY: Social History   Socioeconomic History   Marital status: Married    Spouse name: Not on file   Number of children: 2   Years of education: Not on file   Highest education level: Some college, no degree  Occupational History   Not on file  Social Needs   Financial resource strain: Not  on file   Food insecurity    Worry: Not on file    Inability: Not on file   Transportation needs    Medical: Not on file    Non-medical: Not on file  Tobacco Use   Smoking status: Current Some Day Smoker    Packs/day: 0.25    Years: 45.00    Pack years: 11.25    Types: Cigarettes    Last attempt to quit: 11/06/2014    Years since quitting: 4.4   Smokeless tobacco: Never Used  Substance and Sexual Activity   Alcohol use: Yes    Alcohol/week: 14.0 standard drinks    Types: 14 Standard drinks or equivalent per week   Drug use: No   Sexual activity: Yes  Lifestyle   Physical activity    Days per week: Not on file    Minutes per session: Not on file   Stress: Not on file  Relationships   Social Herbalist on phone: Not on file    Gets together: Not on file    Attends religious service: Not on file    Active member of club or organization: Not on file    Attends meetings of clubs or organizations: Not on file    Relationship status: Not on file   Intimate partner violence    Fear of current or ex partner: Not on file    Emotionally abused: Not on file    Physically abused: Not on file    Forced sexual activity: Not on file  Other Topics Concern   Not on file  Social History Narrative   Married (wife patient of Dr. Yong Channel). Son and daughter. 1 granddaughter.       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: time around house, time with family, yardwork, some cabinetry      Lives in 2 story home with wife and granddaughter, [employer] school graduate      Right handed     REVIEW OF SYSTEMS: Constitutional: No fevers, chills, or sweats, no generalized fatigue, change in appetite Eyes: No visual changes, double vision, eye pain Ear, nose and throat: No hearing loss, ear pain, nasal congestion, sore throat Cardiovascular: No chest pain,  palpitations Respiratory:  No shortness of breath at rest or with exertion, wheezes GastrointestinaI: No nausea, vomiting, diarrhea, abdominal pain, fecal incontinence Genitourinary:  No dysuria, urinary retention or frequency Musculoskeletal:  No neck pain, back pain Integumentary: No rash, pruritus, skin lesions Neurological: as above Psychiatric: No depression, insomnia, anxiety Endocrine: No palpitations, fatigue, diaphoresis, mood swings, change in appetite, change in weight, increased thirst Hematologic/Lymphatic:  No anemia, purpura, petechiae. Allergic/Immunologic: no itchy/runny eyes, nasal congestion, recent allergic reactions, rashes  PHYSICAL EXAM: Vitals:   04/09/19 1122  BP: 128/79  Pulse: 85  Temp: (!) 97.3 F (36.3 C)  SpO2: 98%   General: No acute distress Head:  Normocephalic/atraumatic Neck: supple, no paraspinal tenderness, full range of motion Heart:  Regular rate and rhythm Lungs:  Clear to auscultation bilaterally Back: No paraspinal tenderness Skin/Extremities: No rash, no edema Neurological Exam: alert and oriented to person, place, and time. No aphasia or dysarthria but again noted to have some word-finding difficulty, saying "you know" frequently (similar to prior). Fund of knowledge is appropriate.  Remote memory intact.  Attention and concentration are normal.    Able to name objects and repeat phrases.  Montreal Cognitive Assessment  04/09/2019 12/14/2018 09/25/2018  Visuospatial/ Executive (0/5) 5 4 5   Naming (0/3) 3 3 3   Attention: Read list of digits (0/2) 2 2 2   Attention: Read list of letters (0/1) 0 1 1  Attention: Serial 7 subtraction starting at 100 (0/3) 3 3 3   Language: Repeat phrase (0/2) 2 2 2   Language : Fluency (0/1) 1 1 1   Abstraction (0/2) 2 2 2   Delayed Recall (0/5) 0 0 0  Orientation (0/6) 6 6 6   Total 24 24 25   Adjusted Score (based on education) 25 25 -   Cranial nerves: Pupils equal, round, reactive to light.  Extraocular  movements intact with no nystagmus. Visual fields full. No facial asymmetry. Motor: Bulk and tone normal, muscle strength 5/5 throughout with no pronator drift.  Sensation to light touch intact.  No extinction to double simultaneous stimulation.  Deep tendon reflexes 2+ throughout,  toes downgoing.  Finger to nose testing intact.  Gait narrow-based and steady, able to tandem walk adequately.  Romberg negative.  IMPRESSION: This is a 68 yo RH man with a history of  hypertension, hyperlipidemia, Factor V Leiden deficiency, DVT on Xarelto, with mild cognitive impairment (possibly vascular). Neuropsychological testing in January 2020 showed  objective language testing within normal limits. Cognitive profile indicated select executive dysfunction and variable cognitive efficiency, mildly frontal-subcortical pattern possibly vascular in etiology. MRI brain showedmild to moderate chronic microvascular disease. Continue Donepezil 5mg  daily. Repeat Neuropsychological testing will be scheduled for January 2021 for interval follow-up. We again discussed the importance of control of vascular risk factors, physical exercise, and brain stimulation exercises for brain health. He will follow-up in 8 months and knows to call for any changes.   Thank you for allowing me to participate in his care.  Please do not hesitate to call for any questions or concerns.  The duration of this appointment visit was 30 minutes of face-to-face time with the patient.  Greater than 50% of this time was spent in counseling, explanation of diagnosis, planning of further management, and coordination of care.   [name], M.D.   CC: Dr. Yong Channel

## 2019-04-09 NOTE — Patient Instructions (Signed)
1. Schedule repeat Neurocognitive testing for January 2021  2. Continue Donepezil 5mg  daily  3. Follow-up after Neurocognitive testing  FALL PRECAUTIONS: Be cautious when walking. Scan the area for obstacles that may increase the risk of trips and falls. When getting up in the mornings, sit up at the edge of the bed for a few minutes before getting out of bed. Consider elevating the bed at the head end to avoid drop of blood pressure when getting up. Walk always in a well-lit room (use night lights in the walls). Avoid area rugs or power cords from appliances in the middle of the walkways. Use a walker or a cane if necessary and consider physical therapy for balance exercise. Get your eyesight checked regularly.  HOME SAFETY: Consider the safety of the kitchen when operating appliances like stoves, microwave oven, and blender. Consider having supervision and share cooking responsibilities until no longer able to participate in those. Accidents with firearms and other hazards in the house should be identified and addressed as well.  DRIVING: Regarding driving, in patients with progressive memory problems, driving will be impaired. We advise to have someone else do the driving if trouble finding directions or if minor accidents are reported. Independent driving assessment is available to determine safety of driving.  ABILITY TO BE LEFT ALONE: If patient is unable to contact 911 operator, consider using LifeLine, or when the need is there, arrange for someone to stay with patients. Smoking is a fire hazard, consider supervision or cessation. Risk of wandering should be assessed by caregiver and if detected at any point, supervision and safe proof recommendations should be instituted.  MEDICATION SUPERVISION: Inability to self-administer medication needs to be constantly addressed. Implement a mechanism to ensure safe administration of the medications.  RECOMMENDATIONS FOR ALL PATIENTS WITH MEMORY  PROBLEMS: 1. Continue to exercise (Recommend 30 minutes of walking everyday, or 3 hours every week) 2. Increase social interactions - continue going to [place] and enjoy social gatherings with friends and family 3. Eat healthy, avoid fried foods and eat more fruits and vegetables 4. Maintain adequate blood pressure, blood sugar, and blood cholesterol level. Reducing the risk of stroke and cardiovascular disease also helps promoting better memory. 5. Avoid stressful situations. Live a simple life and avoid aggravations. Organize your time and prepare for the next day in anticipation. 6. Sleep well, avoid any interruptions of sleep and avoid any distractions in the bedroom that may interfere with adequate sleep quality 7. Avoid sugar, avoid sweets as there is a strong link between excessive sugar intake, diabetes, and cognitive impairment The Mediterranean diet has been shown to help patients reduce the risk of progressive memory disorders and reduces cardiovascular risk. This includes eating fish, eat fruits and green leafy vegetables, nuts like almonds and hazelnuts, walnuts, and also use olive oil. Avoid fast foods and fried foods as much as possible. Avoid sweets and sugar as sugar use has been linked to worsening of memory function.  There is always a concern of gradual progression of memory problems. If this is the case, then we may need to adjust level of care according to patient needs. Support, both to the patient and caregiver, should then be put into place.

## 2019-06-19 ENCOUNTER — Other Ambulatory Visit: Payer: Self-pay | Admitting: Family Medicine

## 2019-07-18 ENCOUNTER — Other Ambulatory Visit: Payer: Self-pay | Admitting: Family Medicine

## 2019-07-19 ENCOUNTER — Ambulatory Visit: Payer: Self-pay | Admitting: Neurology

## 2019-09-02 DIAGNOSIS — Z716 Tobacco abuse counseling: Secondary | ICD-10-CM | POA: Diagnosis not present

## 2019-09-02 DIAGNOSIS — F17210 Nicotine dependence, cigarettes, uncomplicated: Secondary | ICD-10-CM | POA: Diagnosis not present

## 2019-09-13 ENCOUNTER — Other Ambulatory Visit: Payer: Self-pay

## 2019-09-13 ENCOUNTER — Encounter: Payer: Self-pay | Admitting: Family Medicine

## 2019-09-13 ENCOUNTER — Ambulatory Visit (INDEPENDENT_AMBULATORY_CARE_PROVIDER_SITE_OTHER): Payer: Medicare HMO | Admitting: Family Medicine

## 2019-09-13 VITALS — BP 132/86 | HR 78 | Temp 97.3°F | Ht 75.0 in | Wt 227.0 lb

## 2019-09-13 DIAGNOSIS — E785 Hyperlipidemia, unspecified: Secondary | ICD-10-CM

## 2019-09-13 DIAGNOSIS — I10 Essential (primary) hypertension: Secondary | ICD-10-CM

## 2019-09-13 DIAGNOSIS — F3342 Major depressive disorder, recurrent, in full remission: Secondary | ICD-10-CM

## 2019-09-13 DIAGNOSIS — D6851 Activated protein C resistance: Secondary | ICD-10-CM

## 2019-09-13 DIAGNOSIS — R739 Hyperglycemia, unspecified: Secondary | ICD-10-CM | POA: Diagnosis not present

## 2019-09-13 LAB — COMPREHENSIVE METABOLIC PANEL
ALT: 23 U/L (ref 0–53)
AST: 20 U/L (ref 0–37)
Albumin: 3.6 g/dL (ref 3.5–5.2)
Alkaline Phosphatase: 57 U/L (ref 39–117)
BUN: 21 mg/dL (ref 6–23)
CO2: 29 mEq/L (ref 19–32)
Calcium: 9.5 mg/dL (ref 8.4–10.5)
Chloride: 105 mEq/L (ref 96–112)
Creatinine, Ser: 0.88 mg/dL (ref 0.40–1.50)
GFR: 86.2 mL/min (ref >60.00)
Glucose, Bld: 87 mg/dL (ref 70–99)
Potassium: 4.8 mEq/L (ref 3.5–5.1)
Sodium: 140 mEq/L (ref 135–145)
Total Bilirubin: 0.4 mg/dL (ref 0.2–1.2)
Total Protein: 5.9 g/dL — ABNORMAL LOW (ref 6.0–8.3)

## 2019-09-13 LAB — LDL CHOLESTEROL, DIRECT: Direct LDL: 86 mg/dL

## 2019-09-13 LAB — HEMOGLOBIN A1C: Hgb A1c MFr Bld: 5.9 % (ref 4.6–6.5)

## 2019-09-13 MED ORDER — BUPROPION HCL ER (XL) 150 MG PO TB24: 150.0000 mg | ORAL_TABLET | Freq: Every day | ORAL | 3 refills | Status: DC

## 2019-09-13 MED ORDER — ROSUVASTATIN CALCIUM 40 MG PO TABS: 40.0000 mg | ORAL_TABLET | Freq: Every day | ORAL | 3 refills | Status: DC

## 2019-09-13 MED ORDER — DESVENLAFAXINE SUCCINATE ER 50 MG PO TB24: 50.0000 mg | ORAL_TABLET | Freq: Every day | ORAL | 3 refills | Status: DC

## 2019-09-13 MED ORDER — RIVAROXABAN 20 MG PO TABS: 20.0000 mg | ORAL_TABLET | Freq: Every day | ORAL | 3 refills | Status: DC

## 2019-09-13 MED ORDER — BENAZEPRIL HCL 20 MG PO TABS: 20.0000 mg | ORAL_TABLET | Freq: Every day | ORAL | 3 refills | Status: DC

## 2019-09-13 NOTE — Progress Notes (Signed)
Phone [phone] In person visit   Subjective:   Rodney Scott is a 68 y.o. year old very pleasant male patient who presents for/with See problem oriented charting Chief Complaint  Patient presents with  . Follow-up    6 month f/u hyperlipidemia    ROS- Review of Systems  Constitutional: Negative.   HENT: Positive for tinnitus (right ear 20 yrs).   Eyes: Negative.   Respiratory: Negative.   Cardiovascular: Positive for leg swelling (left leg swelling).  Gastrointestinal: Negative.   Genitourinary: Negative.   Musculoskeletal: Positive for joint pain (right elbow pain once a day in the morning).  Skin: Positive for itching (both ankles ithching and swelling) and rash.  Neurological: Negative.   Endo/Heme/Allergies: Bruises/bleeds easily.  Psychiatric/Behavioral: Positive for memory loss.    This visit occurred during the SARS-CoV-2 public health emergency.  Safety protocols were in place, including screening questions prior to the visit, additional usage of staff PPE, and extensive cleaning of exam room while observing appropriate contact time as indicated for disinfecting solutions.   Past Medical History-  Patient Active Problem List   Diagnosis Date Noted  . MCI (mild cognitive impairment) 06/15/2015    Priority: High  . Current smoker 07/26/2014    Priority: High  . Factor V Leiden ([place]) 07/31/2007    Priority: High  . DVT, HX OF 07/31/2007    Priority: High  . BPH associated with nocturia 03/10/2018    Priority: Medium  . Hyperglycemia 12/05/2016    Priority: Medium  . Essential hypertension, benign 07/26/2014    Priority: Medium  . Depression, major, recurrent, in complete remission ([place]) 03/24/2009    Priority: Medium  . Hyperlipidemia 07/31/2007    Priority: Medium  . Left inguinal hernia 12/05/2016    Priority: Low  . 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 Osteoarthritis 07/15/2008    Priority: Low  . Venous (peripheral)  insufficiency 12/11/2007    Priority: Low  . Allergic rhinitis 07/31/2007    Priority: Low  . History of colonic polyps 07/31/2007    Priority: Low    Medications- reviewed and updated Current Outpatient Medications  Medication Sig Dispense Refill  . benazepril (LOTENSIN) 20 MG tablet Take 1 tablet by mouth once daily 90 tablet 0  . buPROPion (WELLBUTRIN XL) 150 MG 24 hr tablet Take 1 tablet (150 mg total) by mouth daily. 90 tablet 3  . desvenlafaxine (PRISTIQ) 50 MG 24 hr tablet Take 1 tablet by mouth once daily 90 tablet 0  . donepezil (ARICEPT) 5 MG tablet Take 1 tablet daily 90 tablet 3  . Loratadine (CLARITIN) 10 MG CAPS Take by mouth daily.    . Multiple Vitamin (MULTIVITAMIN) capsule Take by mouth.    . rosuvastatin (CRESTOR) 40 MG tablet Take 1 tablet (40 mg total) by mouth daily. 90 tablet 3  . XARELTO 20 MG TABS tablet Take 1 tablet by mouth once daily 90 tablet 0   No current facility-administered medications for this visit.      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   #Smoker-3 to 5 cigarettes/day last visit now down to 2 a week- congratulated on progress! . He is not ready to quit smoking completely. He declines lung cancer screening. UA last visit. AAA screen September 2018   #Mild cognitive impairment-was started on Aricept 5 mg by Dr. Delice Lesch. He is doing well with this medicine- has not noted any changes or side effects.    #Factor V Leiden-patient remains on Xarelto long-term. Stable without evidence of recurrent DVT   # Hyperglycemia/overweight S:at  last visitast a1c slightly high- offered repeat - he agrees. Discussed importance of weight loss In light of being overweight-he has lost 6 pounds from last visit and congratulated him on that- he feels like being active at work again is helpful and not snacking  Lab Results  Component Value Date   HGBA1C 6.0 03/12/2019   HGBA1C 5.8 03/20/2018   HGBA1C 5.8 09/08/2017  A/P: Hopefully controlled-update A1c with labs today  # Hypertension  S:controlled on benazepril 20mg  A/P:   Stable. Continue current medications.    # Hyperlipidemia  S:controlled with LDL under 100 on crestor 40mg  daily  Lab Results  Component Value Date   CHOL 172 03/12/2019   HDL 48.60 03/12/2019   LDLCALC 96 03/12/2019   LDLDIRECT 102.0 09/08/2017   TRIG 134.0 03/12/2019   CHOLHDL 4 03/12/2019   A/P: Reasonable control with last LDL under 100-we discussed ideal goal under 70 so continue to work on lifestyle changes-continue current medication   # Depression S: remains controlled on pristiq 50 mg and wellbutrin 150mg  XR A/P: See flow sheets for PHQ 9 at one today-stable/well-controlled/full remission-continue current medicines  Recommended follow up: Return in about 6 months (around 03/12/2020) for physical or sooner if needed. Future Appointments  Date Time Provider [place]  10/20/2019  1:00 PM [facility] HEALTH COACH [facility]  11/29/2019 10:00 AM Cameron Sprang, MD [facility] None   Lab/Order associations:   ICD-10-CM   1. Factor V Leiden ([place])  D68.51   2. Hyperglycemia  R73.9   3. Hyperlipidemia, unspecified hyperlipidemia type  E78.5   4. Essential hypertension, benign  I10   5. Depression, major, recurrent, in complete remission ([place])  F33.42     No orders of the defined types were placed in this encounter.   Return precautions advised.  [name], MD

## 2019-09-13 NOTE — Patient Instructions (Addendum)
No changes today. Glad you are doing well!  Please stop by lab before you go If you do not have mychart- we will call you about results within 5 business days of [place] receiving them.  If you have mychart- we will send your results within 3 business days of [place] receiving them.  If abnormal or we want to clarify a result, we will call or mychart you to make sure you receive the message.  If you have questions or concerns or don't hear within 5-7 days, please send [place] a message or call us.     Recommended follow up: Return in about 6 months (around 03/12/2020) for physical or sooner if needed.

## 2019-09-22 ENCOUNTER — Encounter: Payer: Self-pay | Admitting: Family Medicine

## 2019-09-28 ENCOUNTER — Other Ambulatory Visit: Payer: Self-pay

## 2019-09-28 ENCOUNTER — Ambulatory Visit (INDEPENDENT_AMBULATORY_CARE_PROVIDER_SITE_OTHER): Payer: Medicare HMO

## 2019-09-28 DIAGNOSIS — Z0000 Encounter for general adult medical examination without abnormal findings: Secondary | ICD-10-CM | POA: Diagnosis not present

## 2019-09-28 NOTE — Patient Instructions (Addendum)
[name] , Thank you for taking time to come for your Medicare Wellness Visit. I appreciate your ongoing commitment to your health goals. Please review the following plan we discussed and let me know if I can assist you in the future.   Screening recommendations/referrals: Colorectal Screening: up to date; last colonoscopy 09/24/18 with Dr. Fuller Plan   Vision and Dental Exams: Recommended annual ophthalmology exams for early detection of glaucoma and other disorders of the eye Recommended annual dental exams for proper oral hygiene  Vaccinations: Influenza vaccine: completed 07/10/19 Pneumococcal vaccine: up to date; last 07/15/18  Tdap vaccine: up to date; last 01/03/12  Shingles vaccine:You have completed the first in the series of 2.  Second injection of Shingrix needed by 01/07/20  Advanced directives:  I have provided a copy for you to complete at home and have notarized. Once this is complete please bring a copy in to our office so we can scan it into your chart.  Goals: Recommend to drink at least 6-8 8oz glasses of water per day and consume a balanced diet rich in fresh fruits and vegetables.   Recommend to continue efforts to reduce smoking habits until no longer smoking. Smoking Cessation literature is attached.  Next appointment: Please schedule your Annual Wellness Visit with your Nurse Health Advisor in one year.  Preventive Care 44 Years and Older, Male Preventive care refers to lifestyle choices and visits with your health care provider that can promote health and wellness. What does preventive care include?  A yearly physical exam. This is also called an annual well check.  Dental exams once or twice a year.  Routine eye exams. Ask your health care provider how often you should have your eyes checked.  Personal lifestyle choices, including:  Daily care of your teeth and gums.  Regular physical activity.  Eating a healthy diet.  Avoiding tobacco and drug  use.  Limiting alcohol use.  Practicing safe sex.  Taking low doses of aspirin every day if recommended by your health care provider..  Taking vitamin and mineral supplements as recommended by your health care provider. What happens during an annual well check? The services and screenings done by your health care provider during your annual well check will depend on your age, overall health, lifestyle risk factors, and family history of disease. Counseling  Your health care provider may ask you questions about your:  Alcohol use.  Tobacco use.  Drug use.  Emotional well-being.  Home and relationship well-being.  Sexual activity.  Eating habits.  History of falls.  Memory and ability to understand (cognition).  Work and work Statistician. Screening  You may have the following tests or measurements:  Height, weight, and BMI.  Blood pressure.  Lipid and cholesterol levels. These may be checked every 5 years, or more frequently if you are over 26 years old.  Skin check.  Lung cancer screening. You may have this screening every year starting at age 48 if you have a 30-pack-year history of smoking and currently smoke or have quit within the past 15 years.  Fecal occult blood test (FOBT) of the stool. You may have this test every year starting at age 35.  Flexible sigmoidoscopy or colonoscopy. You may have a sigmoidoscopy every 5 years or a colonoscopy every 10 years starting at age 19.  Prostate cancer screening. Recommendations will vary depending on your family history and other risks.  Hepatitis C blood test.  Hepatitis B blood test.  Sexually transmitted disease (STD)  testing.  Diabetes screening. This is done by checking your blood sugar (glucose) after you have not eaten for a while (fasting). You may have this done every 1-3 years.  Abdominal aortic aneurysm (AAA) screening. You may need this if you are a current or former smoker.  Osteoporosis. You may  be screened starting at age 35 if you are at high risk. Talk with your health care provider about your test results, treatment options, and if necessary, the need for more tests. Vaccines  Your health care provider may recommend certain vaccines, such as:  Influenza vaccine. This is recommended every year.  Tetanus, diphtheria, and acellular pertussis (Tdap, Td) vaccine. You may need a Td booster every 10 years.  Zoster vaccine. You may need this after age 42.  Pneumococcal 13-valent conjugate (PCV13) vaccine. One dose is recommended after age 93.  Pneumococcal polysaccharide (PPSV23) vaccine. One dose is recommended after age 35. Talk to your health care provider about which screenings and vaccines you need and how often you need them. This information is not intended to replace advice given to you by your health care provider. Make sure you discuss any questions you have with your health care provider. Document Released: 10/27/2015 Document Revised: 06/19/2016 Document Reviewed: 08/01/2015 Elsevier Interactive Patient Education  2017 [employer].

## 2019-09-28 NOTE — Progress Notes (Signed)
This visit is being conducted via phone call due to the COVID-19 pandemic. This patient has given me verbal consent via phone to conduct this visit, patient states they are participating from their home address. Some vital signs may be absent or patient reported.   Patient identification: identified by name, DOB, and current address.  Location provider: [facility], Office Persons participating in the virtual visit: Denman George LPN, patient, and Dr. [name]     Subjective:   Rodney Scott is a 68 y.o. male who presents for Medicare Annual/Subsequent preventive examination.  Review of Systems:   Cardiac Risk Factors include: smoking/ tobacco exposure;advanced age (>28men, >25 women);hypertension;dyslipidemia    Objective:    Vitals: There were no vitals taken for this visit.  There is no height or weight on file to calculate BMI.  Advanced Directives 09/28/2019 04/09/2019 09/25/2018 02/13/2015  Does Patient Have a Medical Advance Directive? No No No Yes;No  Would patient like information on creating a medical advance directive? Yes (MAU/Ambulatory/Procedural Areas - Information given) - No - Patient declined No - patient declined information    Tobacco Social History   Tobacco Use  Smoking Status Former Smoker  . Packs/day: 0.25  . Years: 45.00  . Pack years: 11.25  . Types: Cigarettes  . Quit date: 08/07/2019  . Years since quitting: 0.1  Smokeless Tobacco Never Used     Counseling given: Yes   Clinical Intake:  Pre-visit preparation completed: Yes  Pain : No/denies pain  Diabetes: No  How often do you need to have someone help you when you read instructions, pamphlets, or other written materials from your doctor or pharmacy?: 1 - Never  Interpreter Needed?: No  Information entered by :: Denman George LPN  Past Medical History:  Diagnosis Date  . Allergic rhinitis   . Allergy   . Clotting disorder ([place])   . DVT (deep venous thrombosis) ([place])     . Factor V deficiency ([place])   . Hyperlipidemia   . Hypertension   . Internal hemorrhoid 11/24/2009   No bleeding. Patient states thought was external.     . Osteoarthritis   . Right rotator cuff tear 12/07/2010   S/p surgery.    . Tubular adenoma of colon 11/2006   Past Surgical History:  Procedure Laterality Date  . right rotator cuff repair    . SALIVARY STONE REMOVAL     Family History  Problem Relation Age of Onset  . Colon cancer Father 59  . Factor V Leiden deficiency Son   . Esophageal cancer Neg Hx   . Rectal cancer Neg Hx   . Stomach cancer Neg Hx    Social History   Socioeconomic History  . Marital status: Married    Spouse name: Not on file  . Number of children: 2  . Years of education: Not on file  . Highest education level: Some college, no degree  Occupational History  . Not on file  Tobacco Use  . Smoking status: Former Smoker    Packs/day: 0.25    Years: 45.00    Pack years: 11.25    Types: Cigarettes    Quit date: 08/07/2019    Years since quitting: 0.1  . Smokeless tobacco: Never Used  Substance and Sexual Activity  . Alcohol use: Yes    Alcohol/week: 14.0 standard drinks    Types: 14 Standard drinks or equivalent per week  . Drug use: No  . Sexual activity: Yes  Other Topics  Concern  . Not on file  Social History Narrative   Married (wife patient of Dr. Yong Channel). Son and daughter. 1 granddaughter.       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 Difficulty of Paying Living Expenses: Not on file  Food Insecurity:   . Worried About Charity fundraiser in the Last Year: Not on file  . Ran Out of Food in the  Last Year: Not on file  Transportation Needs:   . Lack of Transportation (Medical): Not on file  . Lack of Transportation (Non-Medical): Not on file  Physical Activity:   . Days of Exercise per Week: Not on file  . Minutes of Exercise per Session: Not on file  Stress:   . Feeling of Stress : Not on file  Social Connections:   . Frequency of Communication with Friends and Family: Not on file  . Frequency of Social Gatherings with Friends and Family: Not on file  . Attends Religious Services: Not on file  . Active Member of Clubs or Organizations: Not on file  . Attends Archivist Meetings: Not on file  . Marital Status: Not on file    Outpatient Encounter Medications as of 09/28/2019  Medication Sig  . benazepril (LOTENSIN) 20 MG tablet Take 1 tablet (20 mg total) by mouth daily.  Marland Kitchen buPROPion (WELLBUTRIN XL) 150 MG 24 hr tablet Take 1 tablet (150 mg total) by mouth daily.  Marland Kitchen desvenlafaxine (PRISTIQ) 50 MG 24 hr tablet Take 1 tablet (50 mg total) by mouth daily.  Marland Kitchen donepezil (ARICEPT) 5 MG tablet Take 1 tablet daily  . Loratadine (CLARITIN) 10 MG CAPS Take by mouth daily.  . Multiple Vitamin (MULTIVITAMIN) capsule Take by mouth.  . rivaroxaban (XARELTO) 20 MG TABS tablet Take 1 tablet (20 mg total) by mouth daily.  . rosuvastatin (CRESTOR) 40 MG tablet Take 1 tablet (40 mg total) by mouth daily.   No facility-administered encounter medications on file as of 09/28/2019.    Activities of Daily Living In your present state of health, do you have any difficulty performing the following activities: 09/28/2019 09/13/2019  Hearing? N N  Vision? N N  Difficulty concentrating or making decisions? Tempie Donning  Walking or climbing stairs? N N  Dressing or bathing? N N  Doing errands, shopping? N N  Preparing Food and eating ? N -  Using the Toilet? N -  In the past six months, have you accidently leaked urine? N -  Do you have problems with loss of bowel control? N -  Managing your  Medications? N -  Managing your Finances? N -  Comment assisted by spouse -  Housekeeping or managing your Housekeeping? N -  Some recent data might be hidden    Patient Care Team: Marin Olp, MD as PCP - General (Family Medicine)   Assessment:   This is a routine wellness examination for [place].  Exercise Activities and Dietary recommendations Current Exercise Habits: The patient has a physically strenuous job, but has no regular exercise apart from work.  Goals    .  DIET - EAT MORE FRUITS AND VEGETABLES    . Increase physical activity       Fall Risk Fall Risk  09/28/2019 09/13/2019 04/09/2019 03/12/2019 12/14/2018  Falls in the past year? 0 0 0 0 1  Comment - - - - pt tripped over pile of wood, suffered slight L knee injury  Number falls in past yr: - 0 0 0 0  Injury with Fall? 0 0 0 0 1  Follow up Falls evaluation completed;Education provided;Falls prevention discussed - - - -   Is the patient's home free of loose throw rugs in walkways, pet beds, electrical cords, etc?   yes      Grab bars in the bathroom? yes      Handrails on the stairs?   yes      Adequate lighting?   yes  Depression Screen PHQ 2/9 Scores 09/28/2019 09/13/2019 03/12/2019 09/25/2018  PHQ - 2 Score 0 1 1 0  PHQ- 9 Score - 1 1 0    Cognitive Function-followed by neurology; currently on Aricept  MMSE - Mini Mental State Exam 09/25/2018  Orientation to time 5  Orientation to Place 5  Registration 3  Attention/ Calculation 5  Recall 3  Language- name 2 objects 2  Language- repeat 1  Language- follow 3 step command 3  Language- read & follow direction 1  Write a sentence 1  Copy design 1  Total score 30   Montreal Cognitive Assessment  04/23/2019 12/14/2018 09/25/2018  Visuospatial/ Executive (0/5) 5 4 5   Naming (0/3) 3 3 3   Attention: Read list of digits (0/2) 2 2 2   Attention: Read list of letters (0/1) 0 1 1  Attention: Serial 7 subtraction starting at 100 (0/3) 3 3 3   Language: Repeat  phrase (0/2) 2 2 2   Language : Fluency (0/1) 1 1 1   Abstraction (0/2) 2 2 2   Delayed Recall (0/5) 0 0 0  Orientation (0/6) 6 6 6   Total 24 24 25   Adjusted Score (based on education) 25 25 -      Immunization History  Administered Date(s) Administered  . Influenza Split 07/26/2011, 08/07/2012  . Influenza Whole 07/31/2007, 07/15/2008, 07/07/2009, 06/29/2010  . Influenza, High Dose Seasonal PF 09/08/2017, 07/15/2018, 07/10/2019  . Influenza,inj,Quad PF,6+ Mos 08/20/2013, 07/26/2014, 06/15/2015, 07/05/2016  . Influenza-Unspecified 07/10/2019  . Pneumococcal Conjugate-13 06/05/2017  . Pneumococcal Polysaccharide-23 06/22/2010, 07/15/2018  . Td 10/14/2001  . Tdap 01/03/2012  . Zoster Recombinat (Shingrix) 07/10/2019    Qualifies for Shingles Vaccine? #1 of Shingrix completed; reminded patient to have 2nd injection   Screening Tests Health Maintenance  Topic Date Due  . Hepatitis C Screening  11/11/2109 (Originally May 05, 1951)  . COLONOSCOPY  09/24/2021  . TETANUS/TDAP  01/02/2022  . INFLUENZA VACCINE  Completed  . PNA vac Low Risk Adult  Completed   Cancer Screenings: Lung: Low Dose CT Chest recommended if Age 34-80 years, 30 pack-year currently smoking OR have quit w/in 15years. Patient does qualify. Patient would like to defer at this time.  Colorectal: colonoscopy 09/24/18 with Dr. Fuller Plan      Plan:  I have personally reviewed and addressed the Medicare Annual Wellness questionnaire and have noted the following in the patient's chart:  A. Medical and social history B. Use of alcohol, tobacco or illicit drugs  C. Current medications and supplements D. Functional ability and status E.  Nutritional status F.  Physical activity G. Advance directives H. List of other physicians I.  Hospitalizations, surgeries, and ER  visits in previous 12 months J.  [place] such as hearing and vision if needed, cognitive and depression L. Referrals, records requested, and  appointments- none   In addition, I have reviewed and discussed with patient certain preventive protocols, quality metrics, and best practice recommendations. A written personalized care plan for preventive services as well as general preventive health recommendations were provided to patient.   Signed,  Denman George, LPN  Nurse Health Advisor   Nurse Notes: no additional

## 2019-10-14 ENCOUNTER — Ambulatory Visit: Payer: Medicare HMO | Attending: Internal Medicine

## 2019-10-14 ENCOUNTER — Other Ambulatory Visit: Payer: Self-pay

## 2019-10-14 DIAGNOSIS — Z20828 Contact with and (suspected) exposure to other viral communicable diseases: Secondary | ICD-10-CM | POA: Diagnosis not present

## 2019-10-14 DIAGNOSIS — Z20822 Contact with and (suspected) exposure to covid-19: Secondary | ICD-10-CM

## 2019-10-15 LAB — NOVEL CORONAVIRUS, NAA: SARS-CoV-2, NAA: NOT DETECTED

## 2019-10-20 ENCOUNTER — Ambulatory Visit: Payer: Medicare HMO

## 2019-11-26 ENCOUNTER — Encounter: Payer: Self-pay | Admitting: Neurology

## 2019-11-29 ENCOUNTER — Telehealth (INDEPENDENT_AMBULATORY_CARE_PROVIDER_SITE_OTHER): Payer: BC Managed Care – PPO | Admitting: Neurology

## 2019-11-29 ENCOUNTER — Other Ambulatory Visit: Payer: Self-pay

## 2019-11-29 ENCOUNTER — Encounter: Payer: Self-pay | Admitting: Neurology

## 2019-11-29 VITALS — Ht 76.0 in | Wt 225.0 lb

## 2019-11-29 DIAGNOSIS — G3184 Mild cognitive impairment, so stated: Secondary | ICD-10-CM

## 2019-11-29 MED ORDER — DONEPEZIL HCL 5 MG PO TABS: ORAL_TABLET | ORAL | 3 refills | Status: DC

## 2019-11-29 NOTE — Progress Notes (Signed)
Telephone (Audio) Visit The purpose of this telephone visit is to provide medical care while limiting exposure to the novel coronavirus.    Consent was obtained for telephone visit:  Yes.   Answered questions that patient had about telehealth interaction:  Yes.   I discussed the limitations, risks, security and privacy concerns of performing an evaluation and management service by telephone. I also discussed with the patient that there may be a patient responsible charge related to this service. The patient expressed understanding and agreed to proceed.  Pt location: Home Physician Location: office Name of referring provider:  Marin Olp, MD I connected with .Rodney Scott at patients initiation/request on 11/29/2019 at 10:00 AM EST by telephone and verified that I am speaking with the correct person using two identifiers.  Pt MRN:  [id number] Pt DOB:  1951-01-20   History of Present Illness:  The patient had a telephone visit on 11/29/2019. Unable to connect via video due to technical difficulties. He was last seen 8 months ago in the neurology clinic for mild cognitive impairment. [place] 25/30 in March 2020. MRI brain in 2019 no acute changes, there was mild diffuse atrophy, mild to moderate chronic microvascular disease. 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 It was noted that his only language deficit included behaviorally observed occasional use of filler words and difficulty verbally describing some experiences. All objective language testing was within normal limits, no expressive language order at this time. He was started on Donepezil 5mg  daily, he continues on medication without side effects. When asked about his memory, he states "I  remembering things, I don't remember things." He would make a list for the grocery then could not find his list. He denies getting lost driving but does not remember all the places and forget where to go. He denies missing medications or bill payments. His wife tells him he repeats himself. He denies any word-finding difficulties. 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 He denies any headaches, dizziness, focal numbness/tingling/weakness, no falls. Sleep and mood are good.   History on Initial Assessment 09/25/2018: This is a 69 year old right-handed man with a history of hypertension, hyperlipidemia, Factor V Leiden deficiency, DVT on Xarelto, presenting for evaluation of worsening memory. He and his wife started noticing changes over the past year. He states long-term memory is much better than remembering what he did yesterday. He misplaces things frequently at home, they have lived in the same house for 30 years and his wife has to search for things that have always been in the same place for years. He denies getting lost driving but uses his GPS a lot, he cannot remember the way to places like before. He procrastinates on bills but generally does not miss bills, his wife nods that he has. He occasionally forgets his medications. 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 His wife's major concern is how this has affected caring for their 68 year old granddaughter who has been living with them for the past 5 years. He forgets to feed her, which is new. He goes to a store and forgets what he is doing, she has to give him a pretty detailed note so he can remember events for the day.  She has noticed his language skills have decreased markedly, he is not a very verbal person, but she has noticed more difficulty expressing his thoughts or finding words. He may have a thought but stumbles over the words. He repeats  himself, one time last summer he told the same story 3-4 times within half an hour. He has always been easily frustrated, but she feels it is worse, such as when driving, "no one knows what they are doing." No paranoia or hallucinations.   He denies any headaches, dizziness, vision changes, neck/back pain, focal numbness/tingling/weakness, bowel/bladder dysfunction, or tremors. He has lost his sense of smell over the past year for no clear reason. He denies any head injuries. His mother had dementia in her 84s. 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 When asked about mood, he states "I don't know." His wife does not think he is depressed, she is unsure why he is still taking the Wellbutrin and Pristiq, she feels Wellbutrin was started 20 years ago for smoking cessation.     MEDICATIONS: Current Outpatient Medications on File Prior to Visit  Medication Sig Dispense Refill  . benazepril (LOTENSIN) 20 MG tablet Take 1 tablet (20 mg total) by mouth daily. 90 tablet 3  . buPROPion (WELLBUTRIN XL) 150 MG 24 hr tablet Take 1 tablet (150 mg total) by mouth daily. 90 tablet 3  . desvenlafaxine (PRISTIQ) 50 MG 24 hr tablet Take 1 tablet (50 mg total) by mouth daily. 90 tablet 3  . donepezil (ARICEPT) 5 MG tablet Take 1 tablet daily 90 tablet 3  . Loratadine (CLARITIN) 10 MG CAPS Take by mouth daily.    . Multiple Vitamin (MULTIVITAMIN) capsule Take by mouth.    . rivaroxaban (XARELTO) 20 MG TABS tablet Take 1 tablet (20 mg total) by mouth daily. 90 tablet 3  . rosuvastatin (CRESTOR) 40 MG tablet Take 1 tablet (40 mg total) by mouth daily. 90 tablet 3   No current facility-administered medications on file prior to visit.    Observations/Objective:   Vitals:   11/26/19 1259  Weight: 225 lb (102.1 kg)  Height: 6\' 4"  (1.93 m)   Exam limited  due to nature of phone visit. Patient awake, alert, able to answer questions without dysarthria or confusion.   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 Neuropsychological testing in January 2020 showed objective language testing within normal limits. Cognitive profile indicated select executive dysfunction and variable cognitive efficiency, mildly frontal-subcortical pattern possibly vascular in etiology. MRI brain showed mild to moderate chronic microvascular disease. We had discussed repeating Neuropsychological testing for interval follow-up, this will be scheduled for him. Continue Donepezil 5mg  daily. Continue to monitor driving. He will follow-up in 6-8 months and knows to call for any changes.    Follow Up Instructions:   -I discussed the assessment and treatment plan with the patient. The patient was provided an opportunity to ask questions and all were answered. The patient agreed with the plan and demonstrated an understanding of the instructions.   The patient was advised to call back or seek an in-person evaluation if the symptoms worsen or if the condition fails to improve as anticipated.  Total Time spent in visit with the patient was:  6:30 minutes, of which 100% of the time was spent in counseling and/or coordinating care on the above.   Pt understands and agrees with the plan of care outlined.     Cameron Sprang, MD

## 2019-11-29 NOTE — Addendum Note (Signed)
Addended by: Jake Seats on: 11/29/2019 10:43 AM   Modules accepted: Orders

## 2020-01-03 ENCOUNTER — Encounter: Payer: Self-pay | Admitting: Counselor

## 2020-01-03 ENCOUNTER — Other Ambulatory Visit: Payer: Self-pay

## 2020-01-03 ENCOUNTER — Ambulatory Visit (INDEPENDENT_AMBULATORY_CARE_PROVIDER_SITE_OTHER): Payer: BC Managed Care – PPO | Admitting: Counselor

## 2020-01-03 ENCOUNTER — Ambulatory Visit: Payer: Medicare HMO

## 2020-01-03 DIAGNOSIS — G3184 Mild cognitive impairment, so stated: Secondary | ICD-10-CM | POA: Diagnosis not present

## 2020-01-03 DIAGNOSIS — R4701 Aphasia: Secondary | ICD-10-CM

## 2020-01-03 NOTE — Progress Notes (Signed)
   Psychometrist Note   Cognitive testing was administered to Rodney Scott by Lamar Benes, B.S. (Technician) under the supervision of Alphonzo Severance, Psy.D., ABN. [name] was able to tolerate all test procedures. Dr. Nicole Kindred met with the patient as needed to manage any emotional reactions to the testing procedures (if applicable). Rest breaks were offered.    The battery of tests administered was selected by Dr. Nicole Kindred with consideration to the patient's current level of functioning, the nature of his symptoms, emotional and behavioral responses during the interview, level of literacy, observed level of motivation/effort, and the nature of the referral question. This battery was communicated to the psychometrist. Communication between Dr. Nicole Kindred and the psychometrist was ongoing throughout the evaluation and Dr. Nicole Kindred was immediately accessible at all times. Dr. Nicole Kindred provided supervision to the technician on the date of this service, to the extent necessary to assure the quality of all services provided.    [name] will return in approximately one week for an interactive feedback session with Dr. Nicole Kindred, at which time male test performance, clinical impressions, and treatment recommendations will be reviewed in detail. The patient understands he can contact our office should he require our assistance before this time.   A total of 90 minutes of billable time were spent with Rodney Scott by the technician, including test administration and scoring time. Billing for these services is reflected in Dr. Les Pou note.   This note reflects time spent with the psychometrician and does not include test scores, clinical history, or any interpretations made by Dr. Nicole Kindred. The full report will follow in a separate note.

## 2020-01-03 NOTE — Progress Notes (Signed)
Rodney Scott  Patient Name: Rodney Scott MRN: [id number] Date of Birth: December 14, 1950 Age: 69 y.o. Education: 12 years  Referral Circumstances and Background Information  Rodney Scott is a 69 y.o., right-hand dominant, married man with a history of problems with memory and language over approximately the past 3 years. He had previous neuropsychological assessment (10/26/2018) that showed difficulties with memory and executive functioning but no abnormal language findings (although some qualitative changes were noticed). He has been following with Dr. Delice Lesch, who also noticed his tendency to use filler words and difficulties expressing himself verbally). On Interview, Rodney Scott reported that he is aware of his problems and he appreciates mainly memory difficulties, "If I don't write it down, I don't remember." In addition to mild memory problems, he has problems with multitasking. His wife was able to participate by phone and stated that his language problems were the first changes noticed, he has word finding problems, and he has a hard time expressing himself. He has also gotten quieter related to problems expressing himself. She doesn't notice him having any agrammatic speech but she may notice some simplification of sentence structure "to a small degree." She doesn't notice any problems making speech sounds or with articulation. He does have slow effortful speech. He is also forgetful but it doesn't sound like there is rapid forgetting of information. She thinks his problems are about static over the past year. He has some executive difficulties, he has a very hard time multitasking and she has started to write down things for him, for instance when caring for his grandson she will give him a list of things to do explicitly because he won't recall. He is absentminded and forgets things at the store unless she tells him explicitly and gives him a list or texts  him. They haven't noticed any hallucinations or delusions but he does have some apathy and tends to sit around much more than in the past. She described that as a marked difference.   With respect to mood, the patient reported that he is doing well and that he does not feel depressed. His wife does perceive him as "kind of dead" and lacking in spontaneity, she wonders if it is his medications, but she also stated it has been progressing over the course of the last year. For instance if his children come over for dinner, he may not speak with them the entire time, other than one or two words. He doesn't seem very engaged. It sounds like his energy is lower than in the past and he gets tired more than he used to be. He stated that he is sleeping well, he usually gets 8 hours of sleep a night. His wife stated that he often falls asleep on the couch before they go to bed. His appetite is normal for him.   With respect to function, the patient is still working full time and reported that it's going well. 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 He described his job as mainly hands on so it's possible if his problems are relatively focal at this point that they are minimally impactful. He is still driving and they haven't noticed any changes. He is managing his own medications and stated that he is reliable and his wife affirmed. He is still handy  around the house, he mows the lawn and fixes things at his normal level of skill. He is not particularly good at using computers or his smart phone although his wife stated that he has never been very facile. There have been changes with his cooking, his granddaughter used to enjoy spaghetti with garlic and it's not quite like it used to be. With respect to judgment and problem solving, his wife thinks there have been changes and that at  this point, he needs help with more complicated things. For example he was managing their whole life insurance policy and she realized that it was going to use up his benefits in a matter of years and had to get another policy. He still pays some of the bills and has been fairly reliable with that.   Past Medical History and Review of Relevant Studies   Patient Active Problem List   Diagnosis Date Noted  . BPH associated with nocturia 03/10/2018  . Hyperglycemia 12/05/2016  . Left inguinal hernia 12/05/2016  . MCI (mild cognitive impairment) 06/15/2015  . Essential hypertension, benign 07/26/2014  . Current smoker 07/26/2014  . Depression, major, recurrent, in complete remission ([place]) 03/24/2009  . 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 Osteoarthritis 07/15/2008  . Venous (peripheral) insufficiency 12/11/2007  . Hyperlipidemia 07/31/2007  . Factor V Leiden ([place]) 07/31/2007  . Allergic rhinitis 07/31/2007  . DVT, HX OF 07/31/2007  . History of colonic polyps 07/31/2007   Montreal Cognitive Assessment  04/23/2019 12/14/2018 09/25/2018  Visuospatial/ Executive (0/5) 5 4 5   Naming (0/3) 3 3 3   Attention: Read list of digits (0/2) 2 2 2   Attention: Read list of letters (0/1) 0 1 1  Attention: Serial 7 subtraction starting at 100 (0/3) 3 3 3   Language: Repeat phrase (0/2) 2 2 2   Language : Fluency (0/1) 1 1 1   Abstraction (0/2) 2 2 2   Delayed Recall (0/5) 0 0 0  Orientation (0/6) 6 6 6   Total 24 24 25   Adjusted Score (based on education) 25 25 -   Review of Neuroimaging and Relevant Studies:  The patient has an MRI of the brain from January 6th, 2020, that shows mild generalized volume loss in a nonspecific distribution that is not clearly out of proportion for someone his age. 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 There are some perivascular spaces scattered  throughout the basal ganglia.   The patient's previous testing report from Dr. Darol Destine at [facility] was reviewed. Dr. Darol Destine felt that the patient was mainly demonstrating mild executive difficulties. On my review of the test data, he does not have a complete and total storage problem but his memory test findings are such that I question if there isn't a primary memory problem in addition to the aforementioned executive issues. He also had some difficulties with working memory and processing speed.   Essentially normal elemental exam with Dr. Delice Lesch, whose notes were reviewed and are appreciated.   Current Outpatient Medications  Medication Sig Dispense Refill  . benazepril (LOTENSIN) 20 MG tablet Take 1 tablet (20 mg total) by mouth daily. 90 tablet 3  . buPROPion (WELLBUTRIN XL) 150 MG 24 hr tablet Take 1 tablet (150 mg total) by mouth daily. 90 tablet 3  . desvenlafaxine (PRISTIQ) 50 MG 24 hr tablet Take 1 tablet (50 mg total) by mouth daily. 90 tablet 3  . donepezil (ARICEPT) 5  MG tablet Take 1 tablet daily 90 tablet 3  . Loratadine (CLARITIN) 10 MG CAPS Take by mouth daily.    . Multiple Vitamin (MULTIVITAMIN) capsule Take by mouth.    . rivaroxaban (XARELTO) 20 MG TABS tablet Take 1 tablet (20 mg total) by mouth daily. 90 tablet 3  . rosuvastatin (CRESTOR) 40 MG tablet Take 1 tablet (40 mg total) by mouth daily. 90 tablet 3   No current facility-administered medications for this visit.    Family History  Problem Relation Age of Onset  . Colon cancer Father 26  . Factor V Leiden deficiency Son   . Esophageal cancer Neg Hx   . Rectal cancer Neg Hx   . Stomach cancer Neg Hx    Psychosocial History  Developmental, Educational and Employment History: Rodney Scott reported a normal childhood development and grew up in [place], [place]. He described himself as a slow reader and was not the best student, earning mainly C's, although he was never held back. He was  involved in special classses and math and english were his most challenging courses. 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 More recently, he has been employed at a maintenance man at the [employer] and is reportedly still doing well at work.   Psychiatric History: Rodney Scott has a remote history of depression and takes antidepressants. He and his wife both seemed to think that his mood is fine and expressed interest in discontinuing his antidepressants, which I encouraged them to discuss with his PCP. \  Substance Use History: He has a history of drinking 3-4 beers a day but curtailed his use in 2019 and now only has a beer every other day. He smoked for many years and quit in October, 2020.  Relationship History and Living Cimcumstances: Rodney Scott lives with his wife and his 76 year old granddaughter, who they have cared for related to their daughter in law having sustained a TBI and their son working nights.   Mental Status and Behavioral Observations  Sensorium/Arousal: The patient's level of arousal was awake and alert.  Orientation: Rodney Scott was fully oriented to person, place, time, and situation. He was aware of the current and past presidents.  Appearance: Dressed in appropriate, casual clothing.  Behavior: Rodney Scott was pleasant and appropriate.  Speech/language: Rodney Scott often used general words (e.g., "they," "those things") in place of specific words presumably related to word finding problems. He did have word finding problems in spontaneous speech. I did not notice any frank speech praxis errors, phonologic paraphasic errors, or semantic paraphasic errors. There was no frankly agrammatic speech.  Gait/Posture: Appeared normal on observation of ambulation within the clinic.  Movement: No overt signs/symptoms of movement disorder noted on observation, such as  tremors, hypomimia, etc.  Social Comportment: Pleasant and appropriate Mood: "Good" Affect: Mainly neutral Thought process/content: Rodney Scott did seem a bit circumstantial in his thought process at times but was not overtly disorganized and was able to provide much of his own history with minimal assistance. Thought content was pertinent to the topics that were discussed.  Safety: No thought of harming self or others identified at today's visit on direct questioning.  Insight: Fair, seems aware of his cognitive changes  Test Procedures  Wide Range Achievement Test - 4             Word Reading Neuropsychological Assessment Battery  List Learning  Story Learning  Daily Living Memory  Naming  Digit Span Repeatable Battery for the Assessment of Neuropsychological Status (Form A)  Figure Copy  Judgment of Line Orientation  Coding  Figure Recall NACC UDS-3 Neuropsychological Assessment Battery  Sentence Repetition  [employer] Test - Short Form The Dot Counting Test A Random Letter Test Controlled Oral Word Association (F-A-S) Semantic Fluency (Animals) Trail Making Test A & B Complex Ideational Material Clock Drawing Geriatric Depression Scale - Short Form  Plan  Rodney Scott was seen for a psychiatric diagnostic evaluation and neuropsychological testing. On initial impression, he does demonstrate qualitative language problems resembling what is often seen in early PPA with an agrammatic quality (e.g., difficulties formulating thoughts verbally, some word finding errors, using general as opposed to specific words) although his speech is currently fluent and preliminary review of test findings shows no clear pattern of impairment, per se. I do not think his predominate language problems are well described by vascular disease. Full and complete note with impressions, recommendations, and interpretation of test data to follow.   Viviano Simas Nicole Kindred, PsyD, [place] Clinical  Neuropsychologist  Informed Consent and Coding/Compliance  Risks and benefits of the evaluation were discussed with the patient as were the limits of confidentiality. I conducted a clinical interview and neuropsychological testing (more than two tests) with Rodney Scott and Rodney Scott, B.S. (Technician) assisted me in administering additional test procedures. The patient was able to tolerate the testing procedures and the patient (and/or family if applicable) is likely to benefit from further follow up to receive the diagnosis and treatment recommendations, which will be rendered at the next encounter. Billing below reflects technician time, my direct face-to-face time with the patient, time spent in test administration, and time spent in professional activities including but not limited to: neuropsychological test interpretation, integration of neuropsychological test data with clinical history, report preparation, treatment planning, care coordination, and review of diagnostically pertinent medical history or studies.   Services associated with this encounter: Clinical Interview [phone]) plus 60 minutes [id number]; Neuropsychological Evaluation by Professional)  60 minutes [id number]; Neuropsychological Evaluation by Professional, Adl.) 30 minutes [id number]; Test Administration by Professional) 30 minutes [id number]; Neuropsychological Testing by Technician) 60 minutes [id number]; Neuropsychological Testing by Technician, Adl.)

## 2020-01-06 NOTE — Progress Notes (Signed)
[facility]  Patient Name: Rodney Scott MRN: [id number] Date of Birth: July 07, 1951 Age: 69 y.o. Education: 12 years  Measurement properties of test scores: IQ, Index, and Standard Scores (SS): Mean = 100; Standard Deviation = 15 Scaled Scores (Ss): Mean = 10; Standard Deviation = 3 Z scores (Z): Mean = 0; Standard Deviation = 1 T scores (T); Mean = 50; Standard Deviation = 10  TEST SCORES:    Note: This summary of test scores accompanies the interpretive report and should not be considered in isolation without reference to the appropriate sections in the text. Test scores are relative to age, gender, and educational history as available and appropriate.   Performance Validity        A Random Letter Test Raw  Descriptor      Errors 0 Within Expectation  The Dot Counting Test: 12 Within Expectation      Expected Functioning        Wide Range Achievement Test: Standard/Scaled Score Percentile      Word Reading 104 49      Wechsler Adult Intelligence Scale - IV: Standard/Scaled Score Percentile      Information 1 <1      Attention/Processing Speed        Neuropsychological Assessment Battery (Attention Module, Form 1): T-score Percentile      Digits Forward 41 18      Digits Backwards 39 14      Repeatable Battery for the Assessment of Neuropsychological Status (Form A): Scaled Score Percentile      Coding 6 9      Language        Neuropsychological Assessment Battery (Language Module, Form 1): T-score Percentile      Oral Production 48 42      Naming 39 14      Verbal Fluency: T-score Percentile      Controlled Oral Word Association (F-A-S) 48 42      Semantic Fluency (Animals) 39 14      NACC UDS-3 Neuropsychological Battery T-score Percentile      Sentence Repetition 45 31      Northwestern Anagrams Test (Short Form) 57 76      Memory:        Neuropsychological Assessment Battery (Memory Module, Form 1): T-score  Percentile      List Learning           List A Immediate Recall   (3, 3, 6) 35 7         List B Immediate Recall   (3) 46 34         List A Short Delayed Recall   (0) 24 <1         List A Long Delayed Recall   (0) 30 2         List A Long Delayed Yes/No Recognition Hits   (10) -- 31         List A Long Delayed Yes/No Recognition False Alarms   (4) -- 42         List A Recognition Discriminability Index -- 34     Story Learning           Immediate Recall   (12, 25) 37 9         Delayed Recall   (10) 35 7      Daily Living Memory            Immediate Recall   (26,  12) 48 42          Delayed Recall   (2, 0) 19 <1          Recognition Hits -- 2      Repeatable Battery for the Assessment of Neuropsychological Status (Form A): Scaled Score Percentile         Figure Recall   (5) 4 2      Visuospatial/Constructional Functioning        Repeatable Battery for the Assessment of Neuropsychological Status (Form A): Standard/Scaled Score Percentile     Visuospatial/Constructional Index 96 39         Figure Copy 6 9         Judgment of Line Orientation --- >75      Executive Functioning        [employer] Test (MWCST): Standard/T-Score Percentile      Number of Categories Correct 60 85      Number of Perseverative Errors 57 76      Number of Total Errors 52 58      Percent Perseverative Errors 59 82  Executive Function Composite 114 83      Trail Making Test: T-Score Percentile      Part A 53 62      Part B 48 42      Boston Diagnostic Aphasia Exam: Raw Score Scaled Score      Complex Ideational Material 11 9      Clock Drawing Raw Score Descriptor      Command 10 999      Rating Scales         Raw Score Descriptor  Geriatric Depression Scale - Short Form 3 Within Normal Limits    Peter V. Nicole Kindred PsyD, [place] Clinical Neuropsychologist

## 2020-01-07 NOTE — Progress Notes (Signed)
[facility]  Patient Name: Rodney Scott MRN: [id number] Date of Birth: 11-22-1950 Age: 69 y.o. Education: 12 years  Clinical Impressions  Rodney Scott is a 69 y.o., right-hand dominant, married man with a history of memory and language problems over the past 3 years. He has somewhat slow effortful speech, uses filler words (e.g., "you know") frequently but does not have any aggrammatic speech, frank speech praxis problems, or articulatory groping. He was seen at [facility] neuropsychology just over a year ago by Dr. Darol Destine, whose impression was that of mainly subtle executive difficulties although on my review of the test data I question if there isn't a primary memory problem (he did also have weak scores with respect to processing speed and working memory). 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 He has no Parkinsonism on neurological exam.   On neuropsychological testing, there is objective evidence of changes with respect to memory affecting learning and retention of both verbal and visual information. This pattern is similar to the difficulties that Rodney Scott demonstrated at his last assessment albeit with possibly greater visual memory difficulties. He does retain some information across time and thus is not demonstrating a complete memory storage issue. Weak speed of processing was also noted. With respect to language, he does not have any abnormal test scores despite extended testing, although his performance was slightly lower from a raw score perspective on animal fluency, which may be significant given that one would expect practice effects. He performed well on all measures of executive function and screened negative for the presence of depression.   I find Rodney Scott qualitative presentation concerning for a developing progressive aphasia condition although as of yet, he is  not demonstrating any frank language impairments, including on extended testing of grammaticality and Engineer, technical sales (often an early feature in agrammatic presentations). He does continue to demonstrate impaired memory performance. There is no appreciable change from his past assessment though there are a few lower test scores.   Diagnostic Impressions: Mild Cognitive Impairment  Recommendations to be discussed with patient  Your performance and presentation today were very similar to your previous performance with Dr. Darol Destine at [facility]. That is to say, you demonstrated primarily memory problems but are still remembering some information and do not have a complete storage deficit. I do not think there are appreciable changes in your performance from then until now, although there were a few slightly lower scores in some areas. Your diagnosis thus remains the same, mild cognitive impairment.   The major difference between mild cognitive impairment (MCI) and dementia is in severity and potential prognosis. Once someone reaches a level of severity adequate to be diagnosed with a dementia, there is usually progression over time, though this may be years. On the other hand, mild cognitive impairment, while a significant risk for dementia in future, does not always progress to dementia, and in some instances stays the same or can even revert to normal. It is important to realize that if MCI is due to underlying Alzheimer's disease, it will most likely progress to dementia eventually. The rate of conversion to Alzheimer's dementia from amnestic MCI is about 15% per year versus the general population risk of conversion of 2% per year.   In your case, I am not entirely certain what is causing your mild cognitive impairment and think it is too early to tell with  confidence. Qualitatively you do have slow, effortful speech and that is the main symptom that you have noticed, which if it  worsens over time would be consistent with a Primary Progressive Aphasia. Primary progressive aphasia is a condition caused by neurodegenerative conditions, typically Alzheimer's disease or other proteinopathies involving the Tau protein when your difficulties are with expressive speech. The disease process starts out in a different part of the brain than in typical non-aphasic presentations, which causes a language predominant presentation as opposed to the classic memory predominant presentation.   At this point, engaging in healthy lifestyle changes and watching and waiting are likely the best things to do. These healthy lifestyle changes have been shown in some research studies to potentially Scott the progression of cognitive decline, if your decline is due to an underlying condition like Alzheimer's disease, and typically involve diet and exercise.   There is now good quality evidence from at least one large scale study that a modified mediterranean diet may help slow cognitive decline. This is known as the "MIND" diet. The Mind diet is not so much a specific diet as it is a set of recommendations for things that you should and should not eat.   Foods that are ENCOURAGED on the MIND Diet:  Green, leafy vegetables: Aim for six or more servings per week. This includes kale, spinach, cooked greens and salads.  All other vegetables: Try to eat another vegetable in addition to the green leafy vegetables at least once a day. It is best to choose non-starchy vegetables because they have a lot of nutrients with a low number of calories.  Berries: Eat berries at least twice a week. There is a plethora of research on strawberries, and other berries such as blueberries, raspberries and blackberries have also been found to have antioxidant and brain health benefits.  Nuts: Try to get five servings of nuts or more each week. The creators of the [place] don't specify what kind of nuts to consume, but it is  probably best to vary the type of nuts you eat to obtain a variety of nutrients. Peanuts are a legume and do not fall into this category.  Olive oil: Use olive oil as your main cooking oil. There may be other heart-healthy alternatives such as algae oil, though there is not yet sufficient research upon which to base a formal recommendation.  Whole grains: Aim for at least three servings daily. Choose minimally processed grains like oatmeal, quinoa, brown rice, whole-wheat pasta and 100% whole-wheat bread.  Fish: Eat fish at least once a week. It is best to choose fatty fish like salmon, sardines, trout, tuna and mackerel for their high amounts of omega-3 fatty acids.  Beans: Include beans in at least four meals every week. This includes all beans, lentils and soybeans.  Poultry: Try to eat chicken or [place] at least twice a week. Note that fried chicken is not encouraged on the MIND diet.  Wine: Aim for no more than one glass of alcohol daily. Both red and white wine may benefit the brain. However, much research has focused on the red wine compound resveratrol, which may help protect against Alzheimer's disease.  Foods that are DISCOURAGED on the MIND Diet: Butter and margarine: Try to eat less than 1 tablespoon (about 14 grams) daily. Instead, try using olive oil as your primary cooking fat, and dipping your bread in olive oil with herbs.  Cheese: The MIND diet recommends limiting your cheese consumption to less than  once per week.  Red meat: Aim for no more than three servings each week. This includes all beef, pork, lamb and products made from these meats.  [place] food: The MIND diet highly discourages fried food, especially the kind from fast-food restaurants. Limit your consumption to less than once per week.  Pastries and sweets: This includes most of the processed junk food and desserts you can think of. Ice cream, cookies, brownies, snack cakes, donuts, candy and more. Try to limit these to no  more than four times a week.  There is now good quality evidence from at least one large scale study that a modified mediterranean diet may help slow cognitive decline. This is known as the "MIND" diet. The Mind diet is not so much a specific diet as it is a set of recommendations for things that you should and should not eat.   Foods that are ENCOURAGED on the MIND Diet:  Green, leafy vegetables: Aim for six or more servings per week. This includes kale, spinach, cooked greens and salads.  All other vegetables: Try to eat another vegetable in addition to the green leafy vegetables at least once a day. It is best to choose non-starchy vegetables because they have a lot of nutrients with a low number of calories.  Berries: Eat berries at least twice a week. There is a plethora of research on strawberries, and other berries such as blueberries, raspberries and blackberries have also been found to have antioxidant and brain health benefits.  Nuts: Try to get five servings of nuts or more each week. The creators of the [place] don't specify what kind of nuts to consume, but it is probably best to vary the type of nuts you eat to obtain a variety of nutrients. Peanuts are a legume and do not fall into this category.  Olive oil: Use olive oil as your main cooking oil. There may be other heart-healthy alternatives such as algae oil, though there is not yet sufficient research upon which to base a formal recommendation.  Whole grains: Aim for at least three servings daily. Choose minimally processed grains like oatmeal, quinoa, brown rice, whole-wheat pasta and 100% whole-wheat bread.  Fish: Eat fish at least once a week. It is best to choose fatty fish like salmon, sardines, trout, tuna and mackerel for their high amounts of omega-3 fatty acids.  Beans: Include beans in at least four meals every week. This includes all beans, lentils and soybeans.  Poultry: Try to eat chicken or [place] at least twice a week.  Note that fried chicken is not encouraged on the MIND diet.  Wine: Aim for no more than one glass of alcohol daily. Both red and white wine may benefit the brain. However, much research has focused on the red wine compound resveratrol, which may help protect against Alzheimer's disease.  Foods that are DISCOURAGED on the MIND Diet: Butter and margarine: Try to eat less than 1 tablespoon (about 14 grams) daily. Instead, try using olive oil as your primary cooking fat, and dipping your bread in olive oil with herbs.  Cheese: The MIND diet recommends limiting your cheese consumption to less than once per week.  Red meat: Aim for no more than three servings each week. This includes all beef, pork, lamb and products made from these meats.  Mace[place] Pro food: The MIND diet highly discourages fried food, especially the kind from fast-food restaurants. Limit your consumption to less than once per week.  Pastries and sweets: This  includes most of the processed junk food and desserts you can think of. Ice cream, cookies, brownies, snack cakes, donuts, candy and more. Try to limit these to no more than four times a week.  The following compensatory strategies may be helpful for managing day-to-day memory symptoms:  Minimize distractions and interruptions to the extent possible. Be an active observer, present-minded, and focus attention. Focus on only one task for a period of time.   Get organized. Establish routines and stick to them. Make and use checklists.   Use external memory aids as needed, such as a planner and notebook. Repetition, written reminders, and keeping a calendar of appointments may be helpful.  Designate a place to keep your keys, wallet, cell phone, and other personal belongings.   Break down tasks into smaller steps to help get started and to keep from feeling overwhelmed.   Increase your success learning information by breaking it into manageable chunks, connecting it to previously learned  information, or forming associations with what you are trying to remember.   Test Findings  Test scores are summarized in additional documentation associated with this encounter. Test scores are relative to age, gender, and educational history as available and appropriate. There were no concerns about performance validity as all findings fell within normal expectations.   General Intellectual Functioning/Achievement:  Performance on single word reading was average which, in addition to his previous cognitive test scores, presents as a reasonable standard of comparison for Rodney Scott's cognitive test data .   Attention and Processing Efficiency: Performance on indicators of digit repetition was similar to his previous evaluation with low average scores on digit repetition forward and backward.   With respect to processing efficiency, timed number symbol coding was unusually low. He did better on simple numeric sequencing, which was average although that is an extremely easy task.   Language: Despite significant and extended language testing, Rodney Scott performance was within normal limits on all measures. Qualitatively, his speech was slow and effortful. Oral production in response to a picture stimulus was within normal limits. Visual object confrontation naming was low average but still within gross limits of normal. Ability to assemble syntactically correct subject/object sentences to describe a series of different cartoon scenes was high average on the [employer]. Sentence repetition was average with 4/5 correct. Generation of words in response to the letters F-A-S was average whereas his generation of words in response to the category prompt "animals" was low average.    Visuospatial Function: Rodney Scott demonstrated reasonable average range performance overall on the visuospatial/constructional index of the RBANS, although he did achieve an unusually low score on  figure copy due to completing the task in a rushed sloppy manner and omitting details. Judgment of angular line orientations was high average.   Learning and Memory: Memory measures suggested impaired encoding and retention of information across time affecting both verbal and visual material, which is concerning for developing storage problems. Nevertheless Rodney Scott is retaining some information and does not have a complete and total storage deficit.   In the verbal realm, he learned 3, 3, and 6 words of a 12-item word list across three learning trials followed by no words at delayed recall, which is impaired. He did better when asked to choose target words from amongst distractor alternate items with average discriminability. Memory for a short story was weak (at the margin of the low average and unusually low ranges) on initial learning trials with poor retention of information  and unusually low delayed recall. Nevertheless, he did retain some of this story over time. His memory for brief daily living type material was better on initial recall with an average score but he forgot nearly all of it and scored at an extremely low level with respect to delayed recall. In this case recognition cuing was not helpful and discrimination of the information from false choices was extremely low.   In the visual realm, delayed recall of a modestly complex geometric figure was extremely low.   Executive Functions: Performance on executive measures was within normal limits in all areas with in fact very good, high average range performance on the [employer]. Alternating sequencing of numbers and letters of the alphabet was average. Responding to a series of yes/no questions on a measure requiring him to attend to and reason with verbally presented information was average. Clock drawing was within normal limits with reasonable formation of the clock face, numbers, and correct hand  placement. Generation of words in response to the letters F-A-S was average.   Rating Scale(s): Rodney Scott screened negative for the presence of depression.   Rodney Scott Rodney Kindred PsyD, [name] Clinical Neuropsychologist

## 2020-01-10 ENCOUNTER — Encounter: Payer: BC Managed Care – PPO | Admitting: Counselor

## 2020-01-17 ENCOUNTER — Encounter: Payer: Self-pay | Admitting: Counselor

## 2020-01-17 ENCOUNTER — Other Ambulatory Visit: Payer: Self-pay

## 2020-01-17 ENCOUNTER — Ambulatory Visit (INDEPENDENT_AMBULATORY_CARE_PROVIDER_SITE_OTHER): Payer: Medicare HMO | Admitting: Counselor

## 2020-01-17 DIAGNOSIS — G3184 Mild cognitive impairment, so stated: Secondary | ICD-10-CM

## 2020-01-17 NOTE — Patient Instructions (Signed)
Your performance and presentation today were very similar to your previous performance with Dr. Darol Destine at [facility]. That is to say, you demonstrated primarily memory problems but are still remembering some information and do not have a complete storage deficit. I do not think there are appreciable changes in your performance from then until now, although there were a few slightly lower scores in some areas. Your diagnosis thus remains the same, mild cognitive impairment.   The major difference between mild cognitive impairment (MCI) and dementia is in severity and potential prognosis. Once someone reaches a level of severity adequate to be diagnosed with a dementia, there is usually progression over time, though this may be years. On the other hand, mild cognitive impairment, while a significant risk for dementia in future, does not always progress to dementia, and in some instances stays the same or can even revert to normal.It is important to realize that if MCI is due to underlying Alzheimer's disease, it will most likely progress to dementia eventually. The rate of conversion to Alzheimer's dementiafrom amnestic MCI is about 15% per year versus the general population risk of conversion of 2% per year.  In your case, I am not entirely certain what is causing your mild cognitive impairment and think it is too early to tell with confidence. Qualitatively you do have slow, effortful speech and that is the main symptom that you have noticed, which if it worsens over time would be consistent with a Primary Progressive Aphasia. Primary progressive aphasia is a condition caused by neurodegenerative conditions, typically Alzheimer's disease or other proteinopathies involving the Tau protein when your difficulties are with expressive speech. The disease process starts out in a different part of the brain than in typical non-aphasic presentations, which causes language predominant symptoms as  opposed to the classic memory predominant presentation.   At this point, engaging in healthy lifestyle changes and watching and waiting are likely the best things to do. These healthy lifestyle changes have been shown in some research studies to potentially delay the progression of cognitive decline, if your decline is due to an underlying condition like Alzheimer's disease, and typically involve diet and exercise.   There is now good quality evidence from at least one large scale study that a modified mediterranean diet may help slow cognitive decline. This is known as the "MIND" diet. The Mind diet is not so much a specific diet as it is a set of recommendations for things that you should and should not eat.   Foods that are ENCOURAGED on the MIND Diet:  Green, leafy vegetables: Aim for six or more servings per week. This includes kale, spinach, cooked greens and salads.  All other vegetables: Try to eat another vegetable in addition to the green leafy vegetables at least once a day. It is best to choose non-starchy vegetables because they have a lot of nutrients with a low number of calories.  Berries: Eat berries at least twice a week. There is a plethora of research on strawberries, and other berries such as blueberries, raspberries and blackberries have also been found to have antioxidant and brain health benefits.  Nuts: Try to get five servings of nuts or more each week. The creators of the [place] don't specify what kind of nuts to consume, but it is probably best to vary the type of nuts you eat to obtain a variety of nutrients. Peanuts are a legume and do not fall into this category.  Olive oil: Use olive  oil as your main cooking oil. There may be other heart-healthy alternatives such as algae oil, though there is not yet sufficient research upon which to base a formal recommendation.  Whole grains: Aim for at least three servings daily. Choose minimally processed grains like oatmeal,  quinoa, brown rice, whole-wheat pasta and 100% whole-wheat bread.  Fish: Eat fish at least once a week. It is best to choose fatty fish like salmon, sardines, trout, tuna and mackerel for their high amounts of omega-3 fatty acids.  Beans: Include beans in at least four meals every week. This includes all beans, lentils and soybeans.  Poultry: Try to eat chicken or [place] at least twice a week. Note that fried chicken is not encouraged on the MIND diet.  Wine: Aim for no more than one glass of alcohol daily. Both red and white wine may benefit the brain. However, much research has focused on the red wine compound resveratrol, which may help protect against Alzheimer's disease.  Foods that are DISCOURAGED on the MIND Diet: Butter and margarine: Try to eat less than 1 tablespoon (about 14 grams) daily. Instead, try using olive oil as your primary cooking fat, and dipping your bread in olive oil with herbs.  Cheese: The MIND diet recommends limiting your cheese consumption to less than once per week.  Red meat: Aim for no more than three servings each week. This includes all beef, pork, lamb and products made from these meats.  Maceo Pro food: The MIND diet highly discourages fried food, especially the kind from fast-food restaurants. Limit your consumption to less than once per week.  Pastries and sweets: This includes most of the processed junk food and desserts you can think of. Ice cream, cookies, brownies, snack cakes, donuts, candy and more. Try to limit these to no more than four times a week.  The following compensatory strategies may be helpful for managing day-to-day memory symptoms:  Minimize distractions and interruptions to the extent possible. Be an active observer, present-minded, and focus attention. Focus on only one task for a period of time.   Get organized. Establish routines and stick to them. Make and use checklists.   Use external memory aids as needed, such as a planner and  notebook. Repetition, written reminders, and keeping a calendar of appointments may be helpful.  Designate a place to keep your keys, wallet, cell phone, and other personal belongings.   Break down tasks into smaller steps to help get started and to keep from feeling overwhelmed.   Increase your success learning information by breaking it into manageable chunks, connecting it to previously learned information, or forming associations with what you are trying to remember.   Exercise is one of the best medicines for promoting health and maintaining cognitive fitness at all stages in life. Exercise probably has the largest documented effect on brain health and performance of any intervention. Studies have shown that even previously sedentary individuals who start exercising as late as age 24 show a significant survival benefit as compared to their non-exercising peers. In the [place], the current guidelines are for 30 minutes of moderate exercise per day, but increasing your activity level less than that may also be helpful. You do not have to get your 30 minutes of exercise in one shot and exercising for short periods of time spread throughout the day can be helpful. Go for several walks, learn to dance, or do something else you enjoy that gets your body moving. Of course, if you have  an underlying medical condition or there is any question about whether it is safe for you to exercise, you should consult a medical treatment provider prior to beginning exercise.

## 2020-01-17 NOTE — Progress Notes (Signed)
[facility]  I met with Rodney Scott to review the findings resulting from his neuropsychological evaluation. Since the last appointment, he has been about the same.Time was spent reviewing the impressions and recommendations that are detailed in the evaluation report. 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 Interventions provided during this encounter included psychoeducation, motivational enhancement, other topics as reflected in the patient instructions. I took time to explain the findings and answer all the patient's questions. I encouraged Rodney Scott to contact me should he have any further questions or if further follow up is desired.   Current Medications and Medical History   Current Outpatient Medications  Medication Sig Dispense Refill   benazepril (LOTENSIN) 20 MG tablet Take 1 tablet (20 mg total) by mouth daily. 90 tablet 3   buPROPion (WELLBUTRIN XL) 150 MG 24 hr tablet Take 1 tablet (150 mg total) by mouth daily. 90 tablet 3   desvenlafaxine (PRISTIQ) 50 MG 24 hr tablet Take 1 tablet (50 mg total) by mouth daily. 90 tablet 3   donepezil (ARICEPT) 5 MG tablet Take 1 tablet daily 90 tablet 3   Loratadine (CLARITIN) 10 MG CAPS Take by mouth daily.     Multiple Vitamin (MULTIVITAMIN) capsule Take by mouth.     rivaroxaban (XARELTO) 20 MG TABS tablet Take 1 tablet (20 mg total) by mouth daily. 90 tablet 3   rosuvastatin (CRESTOR) 40 MG tablet Take 1 tablet (40 mg total) by mouth daily. 90 tablet 3   No current facility-administered medications for this visit.    Patient Active Problem List   Diagnosis Date Noted   BPH associated with nocturia  03/10/2018   Hyperglycemia 12/05/2016   Left inguinal hernia 12/05/2016   MCI (mild cognitive impairment) 06/15/2015   Essential hypertension, benign 07/26/2014   Current smoker 07/26/2014   Depression, major, recurrent, in complete remission (Rodney Scott) 03/24/2009   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   Osteoarthritis 07/15/2008   Venous (peripheral) insufficiency 12/11/2007   Hyperlipidemia 07/31/2007   Factor V Leiden ([place]) 07/31/2007   Allergic rhinitis 07/31/2007   DVT, HX OF 07/31/2007   History of colonic polyps 07/31/2007    Mental Status and Behavioral Observations  Rodney Scott was available at the scheduled time for this telephonic appointment and was attended by his wife Rodney Scott. He was alert and oriented to all spheres, and participated actively in the clinical encounter. He continues to demonstrate similar language pattern to the last assessment, often saying "you know" and with some difficulties formulating thoughts at times. His thought process was logical, linear, and goal-oriented for the most part. Thought content was appropriate. There were no safety concerns identified at today's encounter, such as thoughts of harming self or others.   Plan  Feedback provided regarding the patient's neuropsychological evaluation. He and his wife presented as appreciative of the encounter, and will start implementing preventative strategies. I also counseled him on smoking cessation, he has quit many times but started back up and would like to quit again. Rodney Scott was encouraged to contact me if any questions arise or if further follow up is desired.   Rodney Scott Rodney Kindred, PsyD, ABN Clinical Neuropsychologist  Service(s) Provided at This Encounter: 50 minutes [phone]; Conjoint therapy  with patient present)

## 2020-03-07 NOTE — Patient Instructions (Addendum)
Please stop by lab before you go If you have mychart- we will send your results within 3 business days of [place] receiving them.  If you do not have mychart- we will call you about results within 5 business days of [place] receiving them.   We will call you within two weeks about your referral to dermatology Dr. Denna Haggard. If you do not hear within 3 weeks, give [place] a call.    depression In full remission-he would like to try to come off antidepressants-we will start by stopping Wellbutrin 150 mg-if he has recurrent depressive symptoms he should let me know immediately and specifically should call if he has any thoughts of self-harm  As always would encourage you to quit smoking!

## 2020-03-07 NOTE — Progress Notes (Signed)
Phone: [phone]   Subjective:  Patient presents today for their annual physical. Chief complaint-noted.   See problem oriented charting- ROS- full  review of systems was completed and negative  except for:  Memory loss, occasional aches and pains  The following were reviewed and entered/updated in epic: Past Medical History:  Diagnosis Date  . Allergic rhinitis   . Allergy   . Clotting disorder ([place])   . DVT (deep venous thrombosis) ([place])   . Factor V deficiency ([place])   . Hyperlipidemia   . Hypertension   . Internal hemorrhoid 11/24/2009   No bleeding. Patient states thought was external.     . Osteoarthritis   . Right rotator cuff tear 12/07/2010   S/p surgery.    . Tubular adenoma of colon 11/2006   Patient Active Problem List   Diagnosis Date Noted  . MCI (mild cognitive impairment) 06/15/2015    Priority: High  . Current smoker 07/26/2014    Priority: High  . Factor V Leiden ([place]) 07/31/2007    Priority: High  . DVT, HX OF 07/31/2007    Priority: High  . BPH associated with nocturia 03/10/2018    Priority: Medium  . Hyperglycemia 12/05/2016    Priority: Medium  . Essential hypertension, benign 07/26/2014    Priority: Medium  . Depression, major, recurrent, in complete remission ([place]) 03/24/2009    Priority: Medium  . Hyperlipidemia 07/31/2007    Priority: Medium  . Left inguinal hernia 12/05/2016    Priority: Low  . 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 Osteoarthritis 07/15/2008    Priority: Low  . Venous (peripheral) insufficiency 12/11/2007    Priority: Low  . Allergic rhinitis 07/31/2007    Priority: Low  . History of colonic polyps 07/31/2007    Priority: Low   Past Surgical History:  Procedure Laterality Date  . right rotator cuff repair    . SALIVARY STONE REMOVAL      Family History  Problem Relation Age of Onset  . Colon cancer Father 32  . Factor V Leiden deficiency Son   . Esophageal cancer Neg Hx   .  Rectal cancer Neg Hx   . Stomach cancer Neg Hx     Medications- reviewed and updated Current Outpatient Medications  Medication Sig Dispense Refill  . benazepril (LOTENSIN) 20 MG tablet Take 1 tablet (20 mg total) by mouth daily. 90 tablet 3  . desvenlafaxine (PRISTIQ) 50 MG 24 hr tablet Take 1 tablet (50 mg total) by mouth daily. 90 tablet 3  . donepezil (ARICEPT) 5 MG tablet Take 1 tablet daily 90 tablet 3  . Loratadine (CLARITIN) 10 MG CAPS Take by mouth daily.    . Multiple Vitamin (MULTIVITAMIN) capsule Take by mouth.    . rivaroxaban (XARELTO) 20 MG TABS tablet Take 1 tablet (20 mg total) by mouth daily. 90 tablet 3  . rosuvastatin (CRESTOR) 40 MG tablet Take 1 tablet (40 mg total) by mouth daily. 90 tablet 3   No current facility-administered medications for this visit.    Allergies-reviewed and updated Allergies  Allergen Reactions  . Yellow Jacket Venom [Bee Venom] Shortness Of Breath    Had swelling localized, chest tightness, and shortness of breath  . Cephalexin     REACTION: solar rash    Social History   Social History Narrative   Married (wife patient of Dr. Yong Channel). Son and daughter. 1 granddaughter.       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 Oropharynx normal Neck: no thyromegaly CV: RRR no murmurs rubs or gallops Lungs: CTAB no crackles, wheeze, rhonchi Abdomen: soft/nontender/nondistended/normal bowel sounds. No rebound or guarding.  Ext: no edema Skin: warm,  dry Neuro: grossly normal, moves all extremities, PERRLA    Assessment and Plan  69 y.o. male presenting for annual physical.  Health Maintenance counseling: 1. Anticipatory guidance: Patient counseled regarding regular dental exams -q6 months, eye exams - advised yearly- he agrees to update,  avoiding smoking and second hand smoke- see below , limiting alcohol to 2 beverages per day - about 2-3 beers a week.   2. Risk factor reduction:  Advised patient of need for regular exercise and diet rich and fruits and vegetables to reduce risk of heart attack and stroke. Exercise- trying to increase this- weight down slightly- trying to be more active at home. Diet-trying to eat a reasonably healthy diet.  Wt Readings from Last 3 Encounters:  03/15/20 223 lb (101.2 kg)  11/26/19 225 lb (102.1 kg)  09/13/19 227 lb (103 kg)  3. Immunizations/screenings/ancillary studies- up to date  Immunization History  Administered Date(s) Administered  . Influenza Split 07/26/2011, 08/07/2012  . Influenza Whole 07/31/2007, 07/15/2008, 07/07/2009, 06/29/2010  . Influenza, High Dose Seasonal PF 09/08/2017, 07/15/2018, 07/10/2019  . Influenza,inj,Quad PF,6+ Mos 08/20/2013, 07/26/2014, 06/15/2015, 07/05/2016  . Influenza-Unspecified 07/10/2019  . PFIZER SARS-COV-2 Vaccination 11/03/2019, 11/24/2019  . Pneumococcal Conjugate-13 06/05/2017  . Pneumococcal Polysaccharide-23 06/22/2010, 07/15/2018  . Td 10/14/2001  . Tdap 01/03/2012  . Zoster Recombinat (Shingrix) 07/10/2019, 09/29/2019  4. Prostate cancer screening- low risk prior PSA trend, trend these with labs  Lab Results  Component Value Date   PSA 0.40 03/12/2019   PSA 0.40 03/20/2018   PSA 0.39 11/28/2016   5. Colon cancer screening - 09/2018 with 3 year repeat planned 6. Skin cancer screening-has seen Dr. Denna Haggard in the past and wants to get plugged back in. advised regular sunscreen use. Denies worrisome, changing, or new skin lesions.  7. Current  smoker- 1/2 PPD increased from last visit. Declines lung cancer screening. AAA screen sept 2018. UA today. Declines lung cancer screening pgoram 8. STD screening - monogomous so declines  Status of chronic or acute concerns   #Mild cognitive impairment-continued mild memory issues. was started on Aricept 5 mg by Dr. Delice Lesch. He is doing well with this medicine   #hyperlipidemia S: Medication: Crestor 40Mg   Lab Results  Component Value Date   CHOL 172 03/12/2019   HDL 48.60 03/12/2019   LDLCALC 96 03/12/2019   LDLDIRECT 86.0 09/13/2019   TRIG 134.0 03/12/2019   CHOLHDL 4 03/12/2019   A/P: Reasonable control last check-ideally we would get LDL under 70 for primary prevention I do not feel strongly about adding Zetia  # Depression S: Medication: Bupropion 150Mg , Pristiq 50Mg  Depression screen [facility] 2/9 03/15/2020 09/28/2019 09/13/2019  Decreased Interest 0  0 0  Down, Depressed, Hopeless 0 0 1  PHQ - 2 Score 0 0 1  Altered sleeping 0 - 0  Tired, decreased energy 1 - 0  Change in appetite 0 - 0  Feeling bad or failure about yourself  0 - 0  Trouble concentrating 0 - 0  Moving slowly or fidgety/restless 0 - 0  Suicidal thoughts 0 - 0  PHQ-9 Score 1 - 1  Difficult doing work/chores Not difficult at all - Not difficult at all  A/P: depression In full remission-he would like to try to come off antidepressants-we will start by stopping Wellbutrin 150 mg-if he has recurrent depressive symptoms he should let me know immediately and specifically should call if he has any thoughts of self-harm  # Hyperglycemia/insulin resistance/prediabetes S:  Medication: none Exercise and diet- weight down slightly- working on activity Lab Results  Component Value Date   HGBA1C 5.9 09/13/2019   HGBA1C 6.0 03/12/2019   HGBA1C 5.8 03/20/2018   A/P: update a1c- hopefully remains controlled. Continue to work on lifestyle  #hypertension S: medication: Benazepril 20Mg  Home readings #s: does not check at  home.  BP Readings from Last 3 Encounters:  03/15/20 130/82  09/13/19 132/86  04/09/19 128/79  A/P:  Stable. Continue current medications.    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 Future Appointments  Date Time Provider [place]  06/05/2020  8:30 AM Cameron Sprang, MD [facility] None  01/18/2021  1:00 PM Hazle Coca, PhD [facility] None  01/18/2021  2:00 PM [facility] TE[facility] None  01/25/2021  2:30 PM Hazle Coca, PhD [facility] None    Lab/Order associations:   ICD-10-CM   1. Preventative health care  Z00.00 CBC with Differential/Platelet    Comprehensive metabolic panel    Lipid panel    PSA  2. Essential hypertension, benign  I10   3. Hyperlipidemia, unspecified hyperlipidemia type  E78.5 CBC with Differential/Platelet    Comprehensive metabolic panel    Lipid panel  4. Depression, major, recurrent, in complete remission ([place])  F33.42   5. Hyperglycemia  R73.9 Hemoglobin A1c  6. Screening for prostate cancer  Z12.5 PSA  7. Factor V Leiden ([place]) Chronic D68.51   8. History of colonic polyps  Z86.010   9. DVT, HX OF  Z86.718   10. Current smoker  F17.200 POCT Urinalysis Dipstick (Automated)  11. Screening exam for skin cancer  Z12.83 Ambulatory referral to Dermatology    No orders of the defined types were placed in this encounter.     Return precautions advised.  [name], MD

## 2020-03-15 ENCOUNTER — Ambulatory Visit (INDEPENDENT_AMBULATORY_CARE_PROVIDER_SITE_OTHER): Payer: Medicare HMO | Admitting: Family Medicine

## 2020-03-15 ENCOUNTER — Encounter: Payer: Self-pay | Admitting: Family Medicine

## 2020-03-15 ENCOUNTER — Other Ambulatory Visit: Payer: Self-pay

## 2020-03-15 VITALS — BP 130/82 | HR 74 | Temp 97.7°F | Ht 76.0 in | Wt 223.0 lb

## 2020-03-15 DIAGNOSIS — I10 Essential (primary) hypertension: Secondary | ICD-10-CM

## 2020-03-15 DIAGNOSIS — Z0000 Encounter for general adult medical examination without abnormal findings: Secondary | ICD-10-CM

## 2020-03-15 DIAGNOSIS — D6851 Activated protein C resistance: Secondary | ICD-10-CM

## 2020-03-15 DIAGNOSIS — Z86718 Personal history of other venous thrombosis and embolism: Secondary | ICD-10-CM

## 2020-03-15 DIAGNOSIS — Z86010 Personal history of colonic polyps: Secondary | ICD-10-CM

## 2020-03-15 DIAGNOSIS — R739 Hyperglycemia, unspecified: Secondary | ICD-10-CM

## 2020-03-15 DIAGNOSIS — F17200 Nicotine dependence, unspecified, uncomplicated: Secondary | ICD-10-CM | POA: Diagnosis not present

## 2020-03-15 DIAGNOSIS — Z125 Encounter for screening for malignant neoplasm of prostate: Secondary | ICD-10-CM | POA: Diagnosis not present

## 2020-03-15 DIAGNOSIS — F3342 Major depressive disorder, recurrent, in full remission: Secondary | ICD-10-CM

## 2020-03-15 DIAGNOSIS — Z1283 Encounter for screening for malignant neoplasm of skin: Secondary | ICD-10-CM

## 2020-03-15 DIAGNOSIS — E785 Hyperlipidemia, unspecified: Secondary | ICD-10-CM

## 2020-03-15 LAB — POC URINALSYSI DIPSTICK (AUTOMATED)
Bilirubin, UA: NEGATIVE
Blood, UA: NEGATIVE
Glucose, UA: NEGATIVE
Ketones, UA: NEGATIVE
Leukocytes, UA: NEGATIVE
Nitrite, UA: NEGATIVE
Protein, UA: NEGATIVE
Spec Grav, UA: 1.025 (ref 1.010–1.025)
Urobilinogen, UA: 0.2 E.U./dL
pH, UA: 6 (ref 5.0–8.0)

## 2020-03-15 LAB — CBC WITH DIFFERENTIAL/PLATELET
Basophils Absolute: 0.1 10*3/uL (ref 0.0–0.1)
Basophils Relative: 0.5 % (ref 0.0–3.0)
Eosinophils Absolute: 0.1 10*3/uL (ref 0.0–0.7)
Eosinophils Relative: 1.4 % (ref 0.0–5.0)
HCT: 44.6 % (ref 39.0–52.0)
Hemoglobin: 15.1 g/dL (ref 13.0–17.0)
Lymphocytes Relative: 24.8 % (ref 12.0–46.0)
Lymphs Abs: 2.5 10*3/uL (ref 0.7–4.0)
MCHC: 34 g/dL (ref 30.0–36.0)
MCV: 96 fl (ref 78.0–100.0)
Monocytes Absolute: 1.1 10*3/uL — ABNORMAL HIGH (ref 0.1–1.0)
Monocytes Relative: 10.7 % (ref 3.0–12.0)
Neutro Abs: 6.2 10*3/uL (ref 1.4–7.7)
Neutrophils Relative %: 62.6 % (ref 43.0–77.0)
Platelets: 191 10*3/uL (ref 150.0–400.0)
RBC: 4.64 Mil/uL (ref 4.22–5.81)
RDW: 13.7 % (ref 11.5–15.5)
WBC: 10 10*3/uL (ref 4.0–10.5)

## 2020-03-15 LAB — LIPID PANEL
Cholesterol: 157 mg/dL (ref 0–200)
HDL: 49.3 mg/dL (ref >39.00)
LDL Cholesterol: 88 mg/dL (ref 0–99)
NonHDL: 107.56
Total CHOL/HDL Ratio: 3
Triglycerides: 98 mg/dL (ref 0.0–149.0)
VLDL: 19.6 mg/dL (ref 0.0–40.0)

## 2020-03-15 LAB — COMPREHENSIVE METABOLIC PANEL
ALT: 20 U/L (ref 0–53)
AST: 18 U/L (ref 0–37)
Albumin: 4 g/dL (ref 3.5–5.2)
Alkaline Phosphatase: 56 U/L (ref 39–117)
BUN: 18 mg/dL (ref 6–23)
CO2: 28 mEq/L (ref 19–32)
Calcium: 9.4 mg/dL (ref 8.4–10.5)
Chloride: 106 mEq/L (ref 96–112)
Creatinine, Ser: 0.93 mg/dL (ref 0.40–1.50)
GFR: 80.75 mL/min (ref >60.00)
Glucose, Bld: 93 mg/dL (ref 70–99)
Potassium: 4.6 mEq/L (ref 3.5–5.1)
Sodium: 139 mEq/L (ref 135–145)
Total Bilirubin: 0.4 mg/dL (ref 0.2–1.2)
Total Protein: 5.9 g/dL — ABNORMAL LOW (ref 6.0–8.3)

## 2020-03-15 LAB — HEMOGLOBIN A1C: Hgb A1c MFr Bld: 5.9 % (ref 4.6–6.5)

## 2020-03-15 LAB — PSA: PSA: 0.38 ng/mL (ref 0.10–4.00)

## 2020-03-15 NOTE — Assessment & Plan Note (Signed)
#   Depression S: Medication: Bupropion 150Mg , Pristiq 50Mg  Depression screen [facility] 2/9 03/15/2020 09/28/2019 09/13/2019  Decreased Interest 0 0 0  Down, Depressed, Hopeless 0 0 1  PHQ - 2 Score 0 0 1  Altered sleeping 0 - 0  Tired, decreased energy 1 - 0  Change in appetite 0 - 0  Feeling bad or failure about yourself  0 - 0  Trouble concentrating 0 - 0  Moving slowly or fidgety/restless 0 - 0  Suicidal thoughts 0 - 0  PHQ-9 Score 1 - 1  Difficult doing work/chores Not difficult at all - Not difficult at all  A/P: depression In full remission-he would like to try to come off antidepressants-we will start by stopping Wellbutrin 150 mg-if he has recurrent depressive symptoms he should let me know immediately and specifically should call if he has any thoughts of self-harm

## 2020-03-15 NOTE — Addendum Note (Signed)
Addended by: Christiana Fuchs on: 03/15/2020 10:17 AM   Modules accepted: Orders

## 2020-05-03 ENCOUNTER — Ambulatory Visit: Payer: Medicare HMO | Admitting: Dermatology

## 2020-05-19 DIAGNOSIS — Z20828 Contact with and (suspected) exposure to other viral communicable diseases: Secondary | ICD-10-CM | POA: Diagnosis not present

## 2020-06-05 ENCOUNTER — Ambulatory Visit: Payer: Medicare HMO | Admitting: Neurology

## 2020-07-19 IMAGING — CR DG ORBITS FOR FOREIGN BODY
2 series · 2 of 2 positions shown · non-contrast
Comparison: None.

CLINICAL DATA: Metal working/exposure; previous history of foreign
body in eye. Clearance prior to MRI

EXAM:
ORBITS FOR FOREIGN BODY - 2 VIEW

[w orbit pa (1 of 2)]
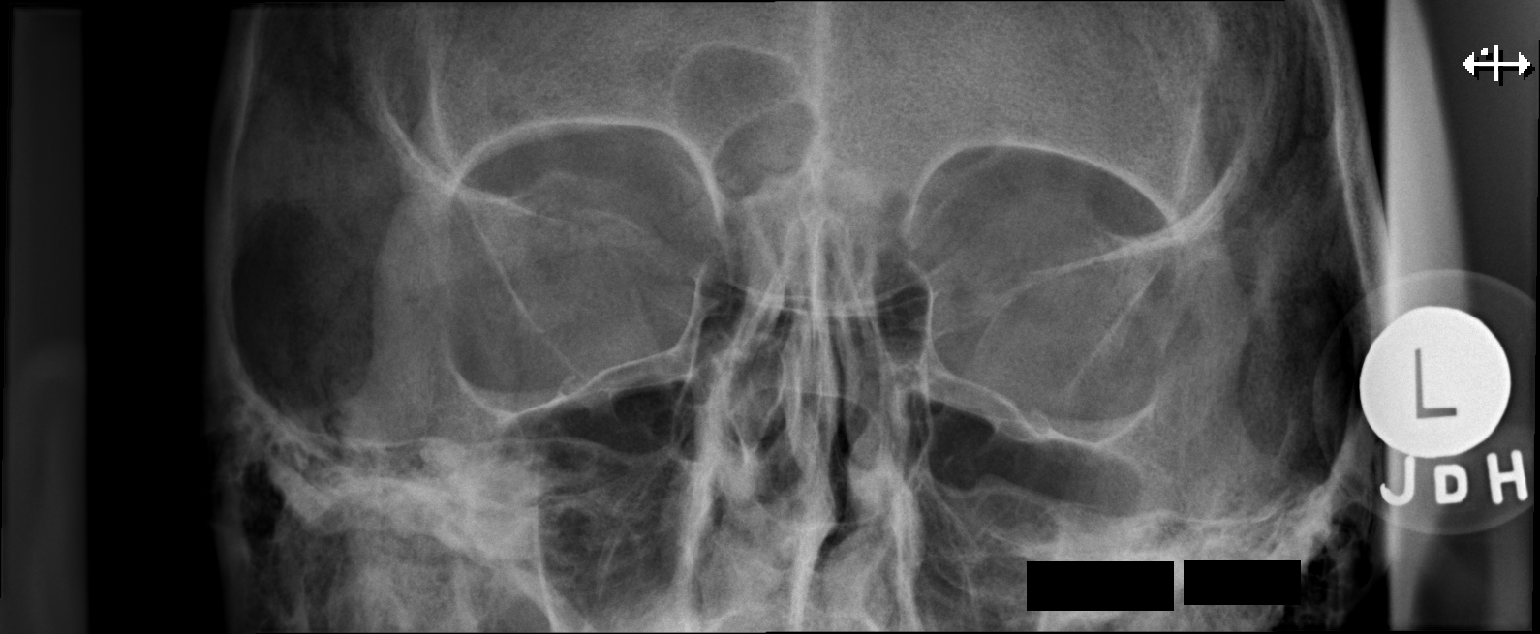

[w orbit pa (2 of 2)]
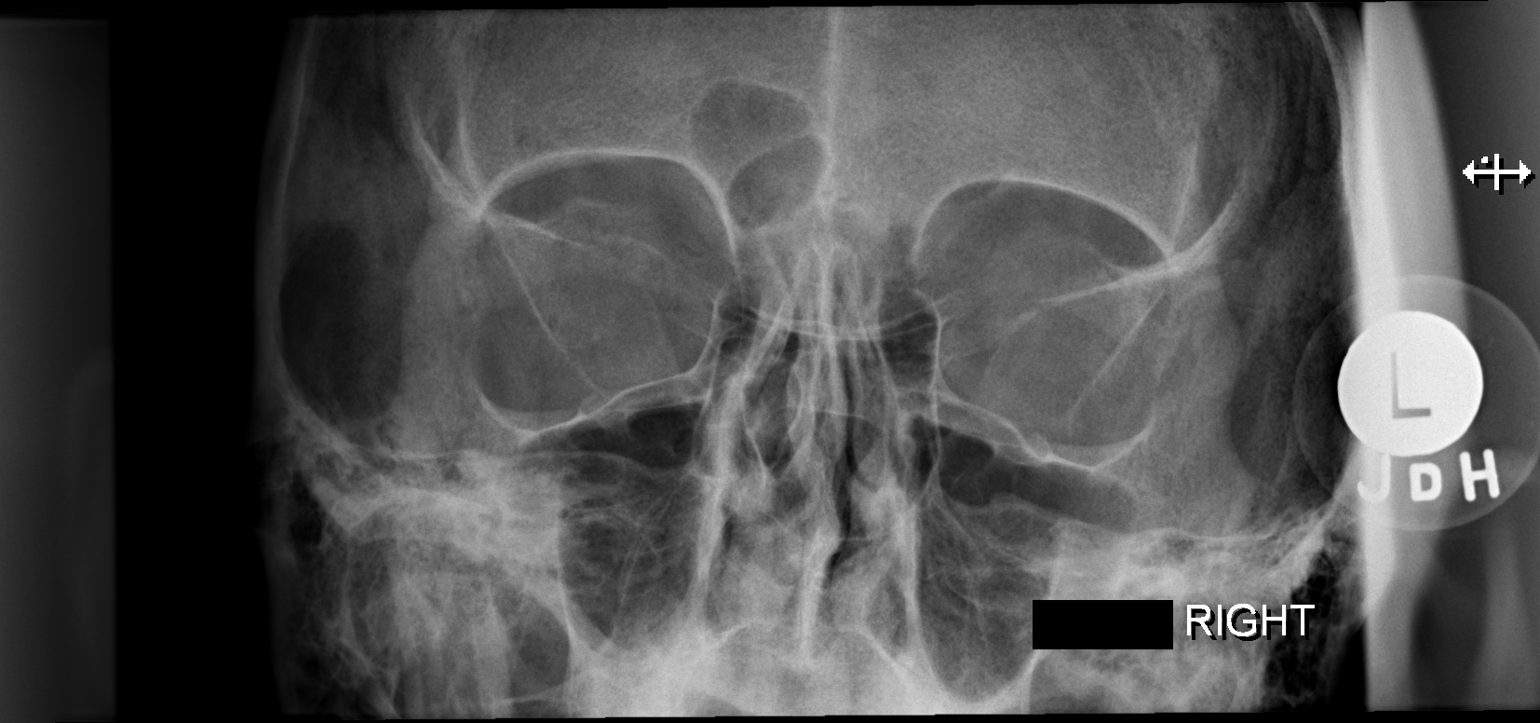

[2 of 2 positions shown; findings below may reference images not displayed]

FINDINGS: There is no evidence of metallic foreign body within the orbits. No
significant bone abnormality identified.
IMPRESSION: No evidence of metallic foreign body within the orbits.

## 2020-08-24 ENCOUNTER — Telehealth: Payer: Self-pay | Admitting: Family Medicine

## 2020-08-24 NOTE — Chronic Care Management (AMB) (Signed)
  Chronic Care Management   Note  08/24/2020 Name: Rodney Scott MRN: [id number] DOB: 19-Mar-1951  Rodney Scott is a 69 y.o. year old male who is a primary care patient of Marin Olp, MD. I reached out to Neysa Hotter by phone today in response to a referral sent by Rodney Scott PCP, Marin Olp, MD.   [name] was given information about Chronic Care Management services today including:  1. CCM service includes personalized support from designated clinical staff supervised by his physician, including individualized plan of care and coordination with other care providers 2. 24/7 contact phone numbers for assistance for urgent and routine care needs. 3. Service will only be billed when office clinical staff spend 20 minutes or more in a month to coordinate care. 4. Only one practitioner may furnish and bill the service in a calendar month. 5. The patient may stop CCM services at any time (effective at the end of the month) by phone call to the office staff.   Patient agreed to services and verbal consent obtained.   Follow up plan:   Lauretta Grill [facility] Scheduler

## 2020-08-28 ENCOUNTER — Encounter: Payer: Self-pay | Admitting: Neurology

## 2020-08-28 ENCOUNTER — Ambulatory Visit (INDEPENDENT_AMBULATORY_CARE_PROVIDER_SITE_OTHER): Payer: Medicare HMO | Admitting: Neurology

## 2020-08-28 ENCOUNTER — Other Ambulatory Visit: Payer: Self-pay

## 2020-08-28 VITALS — BP 116/76 | HR 82 | Ht 77.0 in | Wt 222.0 lb

## 2020-08-28 DIAGNOSIS — G3184 Mild cognitive impairment, so stated: Secondary | ICD-10-CM

## 2020-08-28 MED ORDER — DONEPEZIL HCL 10 MG PO TABS
10.0000 mg | ORAL_TABLET | Freq: Every day | ORAL | 3 refills | Status: DC
Start: 2020-08-28 — End: 2020-10-09

## 2020-08-28 NOTE — Progress Notes (Signed)
NEUROLOGY FOLLOW UP OFFICE NOTE  [name] [id number] [birth year]  HISTORY OF PRESENT ILLNESS: I had the pleasure of seeing Rodney Scott in follow-up in the neurology clinic on 08/28/2020.  The patient was last evaluated 9 months ago by telephone. Records and images were personally reviewed where available.  He underwent repeat Neuropsychological testing in March 2021 which did not show any appreciable change from prior testing with respect to memory affecting learning and retention of both verbal and visual information. Profile concerning for a developing progressive aphasia condition although he was not demonstrating frank language impairments on testing.   Since his last visit, he feels he is doing fine. His wife notes there has overall been a little change, but not a vast amount. She had to take over finances a couple of months ago because some bills were missed. He continues to manage his medications without issues. He continues to drive and uses his GPS a lot. His wife has noticed he is getting more quiet, they are at the table with family and he would not say a word. He lashes out a little when he does not remember conversations. He states his mood is okay, no paranoia or hallucinations. His wife also expressed concern about hearing loss. He denies any headaches, dizziness, focal numbness/tingling/weakness, bowel/bladder dysfunction. No falls.    History on Initial Assessment 09/25/2018: This is a 69 year old right-handed man with a history of hypertension, hyperlipidemia, Factor V Leiden deficiency, DVT on Xarelto, presenting for evaluation of worsening memory. He and his wife started noticing changes over the past year. He states long-term memory is much better than remembering what he did yesterday. He misplaces things frequently at home, they have lived in the same house for 30 years and his wife has to search for things that have always been in the same place for years. He denies  getting lost driving but uses his GPS a lot, he cannot remember the way to places like before. He procrastinates on bills but generally does not miss bills, his wife nods that he has. He occasionally forgets his medications. 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 His wife's major concern is how this has affected caring for their 31 year old granddaughter who has been living with them for the past 5 years. He forgets to feed her, which is new. He goes to a store and forgets what he is doing, she has to give him a pretty detailed note so he can remember events for the day. She has noticed his language skills have decreased markedly, he is not a very verbal person, but she has noticed more difficulty expressing his thoughts or finding words. He may have a thought but stumbles over the words. He repeats himself, one time last summer he told the same story 3-4 times within half an hour. He has always been easily frustrated, but she feels it is worse, such as when driving, "no one knows what they are doing." No paranoia or hallucinations.   He denies any headaches, dizziness, vision changes, neck/back pain, focal numbness/tingling/weakness, bowel/bladder dysfunction, or tremors. He has lost his sense of smell over the past year for no clear reason. He denies any head injuries. His mother had dementia in her 39s. 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 When asked about mood, he states "I don't know." His wife does not think he is depressed, she is unsure why he is still taking the Wellbutrin and Pristiq, she feels Wellbutrin was started 20 years ago for smoking cessation.    PAST MEDICAL HISTORY: Past Medical History:  Diagnosis Date  . Allergic rhinitis   . Allergy   . Clotting disorder ([place])   . DVT (deep  venous thrombosis) ([place])   . Factor V deficiency ([place])   . Hyperlipidemia   . Hypertension   . Internal hemorrhoid 11/24/2009   No bleeding. Patient states thought was external.     . Osteoarthritis   . Right rotator cuff tear 12/07/2010   S/p surgery.    . Tubular adenoma of colon 11/2006    MEDICATIONS: Current Outpatient Medications on File Prior to Visit  Medication Sig Dispense Refill  . benazepril (LOTENSIN) 20 MG tablet Take 1 tablet (20 mg total) by mouth daily. 90 tablet 3  . desvenlafaxine (PRISTIQ) 50 MG 24 hr tablet Take 1 tablet (50 mg total) by mouth daily. 90 tablet 3  . donepezil (ARICEPT) 5 MG tablet Take 1 tablet daily 90 tablet 3  . Loratadine (CLARITIN) 10 MG CAPS Take by mouth daily.    . Multiple Vitamin (MULTIVITAMIN) capsule Take by mouth.    . rivaroxaban (XARELTO) 20 MG TABS tablet Take 1 tablet (20 mg total) by mouth daily. 90 tablet 3  . rosuvastatin (CRESTOR) 40 MG tablet Take 1 tablet (40 mg total) by mouth daily. 90 tablet 3   No current facility-administered medications on file prior to visit.    ALLERGIES: Allergies  Allergen Reactions  . Yellow Jacket Venom [Bee Venom] Shortness Of Breath    Had swelling localized, chest tightness, and shortness of breath    FAMILY HISTORY: Family History  Problem Relation Age of Onset  . Colon cancer Father 66  . Factor V Leiden deficiency Son   . Esophageal cancer Neg Hx   . Rectal cancer Neg Hx   . Stomach cancer Neg Hx     SOCIAL HISTORY: Social History   Socioeconomic History  . Marital status: Married    Spouse name: Not on file  . Number of children: 2  . Years of education: Not on file  . Highest education level: Some college, no degree  Occupational History  . Not on file  Tobacco Use  . Smoking status: Current Every Day Smoker    Packs/day: 0.25    Years: 45.00    Pack years: 11.25    Types: Cigarettes  . Smokeless tobacco: Never Used  Vaping Use  . Vaping Use: Never used   Substance and Sexual Activity  . Alcohol use: Not Currently    Alcohol/week: 14.0 standard drinks    Types: 14 Standard drinks or equivalent per week  . Drug use: No  . Sexual activity: Yes  Other Topics Concern  . Not on file  Social History Narrative   Married (wife patient of Dr. Yong Channel). Son and daughter. 1 granddaughter.       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 Difficulty of Paying Living Expenses: Not on file  Food Insecurity:   . Worried About Charity fundraiser in the Last Year: Not on file  . Ran Out of Food in the Last Year: Not on file  Transportation Needs:   . Lack of Transportation (Medical): Not on file  . Lack of Transportation (Non-Medical): Not on file  Physical Activity:   . Days of Exercise per Week: Not on file  . Minutes of Exercise per Session: Not on file  Stress:   . Feeling of Stress : Not on file  Social Connections:   . Frequency of Communication with Friends and Family: Not on file  . Frequency of Social Gatherings with Friends and Family: Not on file  . Attends Religious Services: Not on file  . Active Member of Clubs or Organizations: Not on file  . Attends Archivist Meetings: Not on file  . Marital Status: Not on file  Intimate Partner Violence:   . Fear of Current or Ex-Partner: Not on file  . Emotionally Abused: Not on file  . Physically Abused: Not on file  . Sexually Abused: Not on file     PHYSICAL EXAM: Vitals:   08/28/20 1420  BP: 116/76  Pulse: 82  SpO2: 99%   General: No acute distress Head:  Normocephalic/atraumatic Skin/Extremities: No rash, no edema Neurological Exam:  alert and oriented to person, place, and time. No aphasia or dysarthria. Fund of knowledge is appropriate.  Recent and remote memory are impaired, 1/3 delayed recall. Attention and concentration are normal, 5/5 WORLD backwards. Able to name and repeat.  Cranial nerves: Pupils equal, round. Extraocular movements intact with no nystagmus. Visual fields full.  No facial asymmetry.  Motor: Bulk and tone normal, muscle strength 5/5 throughout with no pronator drift.   Finger to nose testing intact.  Gait narrow-based and steady, able to tandem walk adequately.  Romberg negative.   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 Repeat Neuropsychological testing in March 2021 was stable compared to prior testing a year ago. No significant aphasia on exam today, his wife mostly notices more memory changes. Increase Donepezil to 10mg  daily. Continue to monitor mood, Pristiq has helped, continue follow-up with Dr. Yong Channel. Continue to monitor driving. Follow-up in 6-8 months, they know to call for any changes.    Thank you for allowing me to participate in his care.  Please do not hesitate to call for any questions or concerns.   [name], M.D.   CC: Dr. Yong Channel

## 2020-08-28 NOTE — Patient Instructions (Signed)
1. Increase Donepezil to 10mg  daily  2. Recommend having a hearing evaluation  3. Continue monitor mood  4. Follow-up in 6-8 months, call for any changes   FALL PRECAUTIONS: Be cautious when walking. Scan the area for obstacles that may increase the risk of trips and falls. When getting up in the mornings, sit up at the edge of the bed for a few minutes before getting out of bed. Consider elevating the bed at the head end to avoid drop of blood pressure when getting up. Walk always in a well-lit room (use night lights in the walls). Avoid area rugs or power cords from appliances in the middle of the walkways. Use a walker or a cane if necessary and consider physical therapy for balance exercise. Get your eyesight checked regularly.  FINANCIAL OVERSIGHT: Supervision, especially oversight when making financial decisions or transactions is also recommended.  HOME SAFETY: Consider the safety of the kitchen when operating appliances like stoves, microwave oven, and blender. Consider having supervision and share cooking responsibilities until no longer able to participate in those. Accidents with firearms and other hazards in the house should be identified and addressed as well.  DRIVING: Regarding driving, in patients with progressive memory problems, driving will be impaired. We advise to have someone else do the driving if trouble finding directions or if minor accidents are reported. Independent driving assessment is available to determine safety of driving.  ABILITY TO BE LEFT ALONE: If patient is unable to contact 911 operator, consider using LifeLine, or when the need is there, arrange for someone to stay with patients. Smoking is a fire hazard, consider supervision or cessation. Risk of wandering should be assessed by caregiver and if detected at any point, supervision and safe proof recommendations should be instituted.  MEDICATION SUPERVISION: Inability to self-administer medication needs to  be constantly addressed. Implement a mechanism to ensure safe administration of the medications.  RECOMMENDATIONS FOR ALL PATIENTS WITH MEMORY PROBLEMS: 1. Continue to exercise (Recommend 30 minutes of walking everyday, or 3 hours every week) 2. Increase social interactions - continue going to [place] and enjoy social gatherings with friends and family 3. Eat healthy, avoid fried foods and eat more fruits and vegetables 4. Maintain adequate blood pressure, blood sugar, and blood cholesterol level. Reducing the risk of stroke and cardiovascular disease also helps promoting better memory. 5. Avoid stressful situations. Live a simple life and avoid aggravations. Organize your time and prepare for the next day in anticipation. 6. Sleep well, avoid any interruptions of sleep and avoid any distractions in the bedroom that may interfere with adequate sleep quality 7. Avoid sugar, avoid sweets as there is a strong link between excessive sugar intake, diabetes, and cognitive impairment The Mediterranean diet has been shown to help patients reduce the risk of progressive memory disorders and reduces cardiovascular risk. This includes eating fish, eat fruits and green leafy vegetables, nuts like almonds and hazelnuts, walnuts, and also use olive oil. Avoid fast foods and fried foods as much as possible. Avoid sweets and sugar as sugar use has been linked to worsening of memory function.  There is always a concern of gradual progression of memory problems. If this is the case, then we may need to adjust level of care according to patient needs. Support, both to the patient and caregiver, should then be put into place.

## 2020-09-21 NOTE — Progress Notes (Signed)
Phone [phone] In person visit   Subjective:   Rodney Scott is a 69 y.o. year old very pleasant male patient who presents for/with See problem oriented charting Chief Complaint  Patient presents with  . Hyperlipidemia  . Hyperglycemia  . Depression  . Hypertension    Not taking BP at home    This visit occurred during the SARS-CoV-2 public health emergency.  Safety protocols were in place, including screening questions prior to the visit, additional usage of staff PPE, and extensive cleaning of exam room while observing appropriate contact time as indicated for disinfecting solutions.   Past Medical History-  Patient Active Problem List   Diagnosis Date Noted  . MCI (mild cognitive impairment) 06/15/2015    Priority: High  . Current smoker 07/26/2014    Priority: High  . Factor V Leiden ([place]) 07/31/2007    Priority: High  . DVT, HX OF 07/31/2007    Priority: High  . BPH associated with nocturia 03/10/2018    Priority: Medium  . Hyperglycemia 12/05/2016    Priority: Medium  . Essential hypertension, benign 07/26/2014    Priority: Medium  . Depression, major, recurrent, in complete remission ([place]) 03/24/2009    Priority: Medium  . Hyperlipidemia 07/31/2007    Priority: Medium  . Senile purpura ([place]) 09/22/2020    Priority: Low  . Left inguinal hernia 12/05/2016    Priority: Low  . 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 Osteoarthritis 07/15/2008    Priority: Low  . Venous (peripheral) insufficiency 12/11/2007    Priority: Low  . Allergic rhinitis 07/31/2007    Priority: Low  . History of colonic polyps 07/31/2007    Priority: Low    Medications- reviewed and updated Current Outpatient Medications  Medication Sig Dispense Refill  . benazepril (LOTENSIN) 20 MG tablet Take 1 tablet (20 mg total) by mouth daily. 90 tablet 3  . desvenlafaxine (PRISTIQ) 50 MG 24 hr tablet Take 1 tablet (50 mg total) by mouth daily. 90 tablet 3  .  donepezil (ARICEPT) 10 MG tablet Take 1 tablet (10 mg total) by mouth at bedtime. 90 tablet 3  . Loratadine 10 MG CAPS Take by mouth daily.    . Multiple Vitamin (MULTIVITAMIN) capsule Take by mouth.    . rivaroxaban (XARELTO) 20 MG TABS tablet Take 1 tablet (20 mg total) by mouth daily. 90 tablet 3  . rosuvastatin (CRESTOR) 40 MG tablet Take 1 tablet (40 mg total) by mouth daily. 90 tablet 3   No current facility-administered medications for this visit.     Objective:  BP 132/86   Pulse 72   Temp 98.2 F (36.8 C) (Temporal)   Ht 6\' 5"  (1.956 m)   Wt 225 lb 3.2 oz (102.2 kg)   SpO2 99%   BMI 26.70 kg/m  Gen: NAD, resting comfortably CV: RRR no murmurs rubs or gallops Lungs: CTAB no crackles, wheeze, rhonchi Abdomen: soft/nontender/nondistended/normal bowel sounds. Ext: trace edema- not using compression stockings Skin: warm, dry     Assessment and Plan   #Mild congnitive impairment-continued mild memory issues.  Remains on Aricept now up to 10 mg through Dr. Delice Lesch.  Denies any current issues. Sees neuropsych back in April and Dr. Delice Lesch a few months later  #hyperlipidemia S: Medication: rosuvastatin 40Mg   Lab Results  Component Value Date   CHOL 157 03/15/2020   HDL 49.30 03/15/2020   LDLCALC 88 03/15/2020   LDLDIRECT 86.0 09/13/2019   TRIG 98.0 03/15/2020  CHOLHDL 3 03/15/2020   A/P: Considering patient is on medication for primary prevention only-continue Crestor 40 mg as he is tolerating this  # Hyperglycemia/insulin resistance/prediabetes S:  Medication: None Exercise and diet- still active with [employer] maintenance- keeps him moving. No intentional regular exercise and I encouraged him to start even if just walking Lab Results  Component Value Date   HGBA1C 5.9 03/15/2020   HGBA1C 5.9 09/13/2019   HGBA1C 6.0 03/12/2019   A/P: hopefully stable- update a1c today  # Depression S: Medication: Pristiq 50Mg , Wellbutrin 150 mg last visit-we stopped Wellbutrin per his  request Depression screen [facility] 2/9 09/22/2020 03/15/2020 09/28/2019  Decreased Interest 0 0 0  Down, Depressed, Hopeless 0 0 0  PHQ - 2 Score 0 0 0  Altered sleeping 0 0 -  Tired, decreased energy 1 1 -  Change in appetite 0 0 -  Feeling bad or failure about yourself  0 0 -  Trouble concentrating 0 0 -  Moving slowly or fidgety/restless 0 0 -  Suicidal thoughts 0 0 -  PHQ-9 Score 1 1 -  Difficult doing work/chores Not difficult at all Not difficult at all -  A/P: Depression remains in full remission despite stopping Wellbutrin- continue pristiq 50mg  for now. He had wanted to reduce doses previously but is ok maintaining at present- if stable at next visit consider trial 25mg  but I typically try to avoid reductions in winter months   #hypertension S: medication: benazepril 20Mg  BP Readings from Last 3 Encounters:  09/22/20 132/86  08/28/20 116/76  03/15/20 130/82  A/P: Reasonable control-continue current medication  #Factor V Leiden-patient with history of DVT and PE.  Remains on chronic Xarelto and doing well.  Continue current medication  #senile pupura- notes "thin skin" and easy bruising/bleeding. Xarelto and aging likely contribute  Recommended follow up: Return in about 6 months (around 03/23/2021) for physical or sooner if needed. Future Appointments  Date Time Provider [place]  10/11/2020  2:00 PM [facility] CCM PHARMACIST [facility]  01/18/2021  1:00 PM Hazle Coca, PhD [facility] None  01/18/2021  2:00 PM [facility]- NEUROPSYCH TECH [facility] None  01/25/2021  2:30 PM Hazle Coca, PhD [facility] None  05/09/2021 10:00 AM Cameron Sprang, MD [facility] None    Lab/Order associations:   ICD-10-CM   1. Essential hypertension, benign  V61 COMPLETE METABOLIC PANEL WITH GFR    CBC With Differential/Platelet  2. Depression, major, recurrent, in complete remission ([place])  F33.42   3. Hyperglycemia  R73.9 Hemoglobin A1c  4. Hyperlipidemia, unspecified hyperlipidemia type  E78.5  COMPLETE METABOLIC PANEL WITH GFR    CBC With Differential/Platelet  5. Factor V Leiden ([place])  D68.51   6. Senile purpura ([place])  D69.2    Return precautions advised.  [name], MD

## 2020-09-21 NOTE — Patient Instructions (Addendum)
Please stop by lab before you go If you have mychart- we will send your results within 3 business days of [place] receiving them.  If you do not have mychart- we will call you about results within 5 business days of [place] receiving them.  *please note we are currently using [facility] which has a longer processing time than [place] typically so labs may not come back as quickly as in the past *please also note that you will see labs on mychart as soon as they post. I will later go in and write notes on them- will say "notes from Dr. Yong Channel"  [facility] you have a wonderful holiday season!   No changes today. I know you have wanted to decrease depresison medicines but lets see how you do over next 6 months and then reconsider  Recommended follow up: Return in about 6 months (around 03/23/2021) for physical or sooner if needed.

## 2020-09-22 ENCOUNTER — Other Ambulatory Visit: Payer: Self-pay

## 2020-09-22 ENCOUNTER — Encounter: Payer: Self-pay | Admitting: Family Medicine

## 2020-09-22 ENCOUNTER — Ambulatory Visit (INDEPENDENT_AMBULATORY_CARE_PROVIDER_SITE_OTHER): Payer: BC Managed Care – PPO | Admitting: Family Medicine

## 2020-09-22 VITALS — BP 132/86 | HR 72 | Temp 98.2°F | Ht 77.0 in | Wt 225.2 lb

## 2020-09-22 DIAGNOSIS — F3342 Major depressive disorder, recurrent, in full remission: Secondary | ICD-10-CM | POA: Diagnosis not present

## 2020-09-22 DIAGNOSIS — E785 Hyperlipidemia, unspecified: Secondary | ICD-10-CM

## 2020-09-22 DIAGNOSIS — D692 Other nonthrombocytopenic purpura: Secondary | ICD-10-CM

## 2020-09-22 DIAGNOSIS — R739 Hyperglycemia, unspecified: Secondary | ICD-10-CM

## 2020-09-22 DIAGNOSIS — I10 Essential (primary) hypertension: Secondary | ICD-10-CM

## 2020-09-22 DIAGNOSIS — D6851 Activated protein C resistance: Secondary | ICD-10-CM | POA: Diagnosis not present

## 2020-09-22 HISTORY — DX: Other nonthrombocytopenic purpura: D69.2

## 2020-09-23 LAB — COMPLETE METABOLIC PANEL WITH GFR
AG Ratio: 2.1 (calc) (ref 1.0–2.5)
ALT: 24 U/L (ref 9–46)
AST: 20 U/L (ref 10–35)
Albumin: 4 g/dL (ref 3.6–5.1)
Alkaline phosphatase (APISO): 55 U/L (ref 35–144)
BUN: 19 mg/dL (ref 7–25)
CO2: 30 mmol/L (ref 20–32)
Calcium: 9.7 mg/dL (ref 8.6–10.3)
Chloride: 106 mmol/L (ref 98–110)
Creat: 0.87 mg/dL (ref 0.70–1.25)
GFR, Est African American: 103 mL/min/{1.73_m2} (ref >=60)
GFR, Est Non African American: 89 mL/min/{1.73_m2} (ref >=60)
Globulin: 1.9 g/dL (calc) (ref 1.9–3.7)
Glucose, Bld: 84 mg/dL (ref 65–99)
Potassium: 4.6 mmol/L (ref 3.5–5.3)
Sodium: 142 mmol/L (ref 135–146)
Total Bilirubin: 0.3 mg/dL (ref 0.2–1.2)
Total Protein: 5.9 g/dL — ABNORMAL LOW (ref 6.1–8.1)

## 2020-09-23 LAB — CBC WITH DIFFERENTIAL/PLATELET
Absolute Monocytes: 958 cells/uL — ABNORMAL HIGH (ref 200–950)
Basophils Absolute: 47 cells/uL (ref 0–200)
Basophils Relative: 0.5 %
Eosinophils Absolute: 149 cells/uL (ref 15–500)
Eosinophils Relative: 1.6 %
HCT: 45.5 % (ref 38.5–50.0)
Hemoglobin: 15.5 g/dL (ref 13.2–17.1)
Lymphs Abs: 2567 cells/uL (ref 850–3900)
MCH: 32.7 pg (ref 27.0–33.0)
MCHC: 34.1 g/dL (ref 32.0–36.0)
MCV: 96 fL (ref 80.0–100.0)
MPV: 9.8 fL (ref 7.5–12.5)
Monocytes Relative: 10.3 %
Neutro Abs: 5580 cells/uL (ref 1500–7800)
Neutrophils Relative %: 60 %
Platelets: 199 10*3/uL (ref 140–400)
RBC: 4.74 10*6/uL (ref 4.20–5.80)
RDW: 11.8 % (ref 11.0–15.0)
Total Lymphocyte: 27.6 %
WBC: 9.3 10*3/uL (ref 3.8–10.8)

## 2020-09-23 LAB — HEMOGLOBIN A1C
Hgb A1c MFr Bld: 5.6 % of total Hgb (ref <5.7)
Mean Plasma Glucose: 114 mg/dL
eAG (mmol/L): 6.3 mmol/L

## 2020-10-09 ENCOUNTER — Telehealth: Payer: Self-pay | Admitting: Neurology

## 2020-10-09 ENCOUNTER — Other Ambulatory Visit: Payer: Self-pay

## 2020-10-09 MED ORDER — DONEPEZIL HCL 10 MG PO TABS: 10.0000 mg | ORAL_TABLET | Freq: Every day | ORAL | 3 refills | Status: DC

## 2020-10-10 ENCOUNTER — Telehealth: Payer: Self-pay

## 2020-10-10 NOTE — Progress Notes (Signed)
Chronic Care Management Pharmacy Assistant   Name: Rodney Scott  MRN: [id number] DOB: Jul 29, 1951  Reason for Encounter: Medication Review/ Initial Visit     Rodney Scott,  69 y.o. , male presents for their Initial CCM visit with the clinical pharmacist In office.  PCP : Shelva Majestic, MD  Allergies:   Allergies  Allergen Reactions  . Yellow Jacket Venom [Bee Venom] Shortness Of Breath    Had swelling localized, chest tightness, and shortness of breath    Medications: Outpatient Encounter Medications as of 10/10/2020  Medication Sig  . benazepril (LOTENSIN) 20 MG tablet Take 1 tablet (20 mg total) by mouth daily.  Marland Kitchen desvenlafaxine (PRISTIQ) 50 MG 24 hr tablet Take 1 tablet (50 mg total) by mouth daily.  Marland Kitchen donepezil (ARICEPT) 10 MG tablet Take 1 tablet (10 mg total) by mouth at bedtime.  . Loratadine 10 MG CAPS Take by mouth daily.  . Multiple Vitamin (MULTIVITAMIN) capsule Take by mouth.  . rivaroxaban (XARELTO) 20 MG TABS tablet Take 1 tablet (20 mg total) by mouth daily.  . rosuvastatin (CRESTOR) 40 MG tablet Take 1 tablet (40 mg total) by mouth daily.   No facility-administered encounter medications on file as of 10/10/2020.    Current Diagnosis: Patient Active Problem List   Diagnosis Date Noted  . Senile purpura ([facility]) 09/22/2020  . BPH associated with nocturia 03/10/2018  . Hyperglycemia 12/05/2016  . Left inguinal hernia 12/05/2016  . MCI (mild cognitive impairment) 06/15/2015  . Essential hypertension, benign 07/26/2014  . Current smoker 07/26/2014  . Depression, major, recurrent, in complete remission ([facility]) 03/24/2009  . 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 Osteoarthritis 07/15/2008  . Venous (peripheral) insufficiency 12/11/2007  . Hyperlipidemia 07/31/2007  . Factor V Leiden ([facility]) 07/31/2007  . Allergic rhinitis 07/31/2007  . DVT, HX OF 07/31/2007  . History of colonic polyps 07/31/2007    Have you seen any other providers since  your last visit?           Patient stated he has not seen any other providers  Any changes in your medications or health?           Patient stated no changes in medications or health Any side effects from any medications?             Patient stated no side effects from any medications  Do you have an symptoms or problems not managed by your medications?               Patient stated No problems however he states he gets the sniffles with temperature change and takes otc medicine but lately it has not been doing as much Any concerns about your health right now?                Patient states no concerns about health at this moment Has your provider asked that you check blood pressure, blood sugar, or follow special diet at home?                Patient stated he does not check blood pressure or blood sugar and does not follow a special diet.  Do you get any type of exercise on a regular basis?                  Patient stated he does yard work and still works and goes walking. Can you think of a goal you would like to reach for your health?  Patient would like his memory to get better.  Do you have any problems getting your medications?              Patient stated he has no problems getting any medications.  Is there anything that you would like to discuss during the appointment?            Patient stated not at this moment.   Please bring medications and supplements to appointment  Georgiana Scott ,[facility] Clinical Pharmacist Assistant [phone]   Follow-Up:  Pharmacist Review

## 2020-10-11 ENCOUNTER — Ambulatory Visit: Payer: BC Managed Care – PPO

## 2020-10-11 ENCOUNTER — Other Ambulatory Visit: Payer: Self-pay

## 2020-10-11 DIAGNOSIS — I10 Essential (primary) hypertension: Secondary | ICD-10-CM

## 2020-10-11 DIAGNOSIS — F17200 Nicotine dependence, unspecified, uncomplicated: Secondary | ICD-10-CM

## 2020-10-11 DIAGNOSIS — E785 Hyperlipidemia, unspecified: Secondary | ICD-10-CM

## 2020-10-11 NOTE — Progress Notes (Signed)
Chronic Care Management Pharmacy Name: Rodney Scott     MRN: [id number]     DOB: Jul 30, 1951  Chief Complaint/ HPI Rodney Scott, 69 y.o., male, presents for their initial CCM visit with the clinical pharmacist in office.  PCP: Rodney Olp, MD Encounter Diagnoses  Name Primary?  . Hyperlipidemia, unspecified hyperlipidemia type Yes  . Essential hypertension, benign   . Current smoker     Office Visits:  09/22/2020 (PCP): stopped bupropion last visit 03/2020 per pt request, continued on pristiq 50 mg. Maintain pristiq throughout winter months - may consider $RemoveBefo'25mg'erMLNLJWdea$ . No intentional exercise, walking encouraged  Patient Active Problem List   Diagnosis Date Noted  . Senile purpura ([place]) 09/22/2020  . BPH associated with nocturia 03/10/2018  . Hyperglycemia 12/05/2016  . Left inguinal hernia 12/05/2016  . MCI (mild cognitive impairment) 06/15/2015  . Essential hypertension, benign 07/26/2014  . Current smoker 07/26/2014  . Depression, major, recurrent, in complete remission ([place]) 03/24/2009  . 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 Osteoarthritis 07/15/2008  . Venous (peripheral) insufficiency 12/11/2007  . Hyperlipidemia 07/31/2007  . Factor V Leiden ([place]) 07/31/2007  . Allergic rhinitis 07/31/2007  . DVT, HX OF 07/31/2007  . History of colonic polyps 07/31/2007   Past Surgical History:  Procedure Laterality Date  . right rotator cuff repair    . SALIVARY STONE REMOVAL     Family History  Problem Relation Age of Onset  . Colon cancer Father 15  . Factor V Leiden deficiency Son   . Esophageal cancer Neg Hx   . Rectal cancer Neg Hx   . Stomach cancer Neg Hx    Allergies  Allergen Reactions  . Yellow Jacket Venom [Bee Venom] Shortness Of Breath    Had swelling localized, chest tightness, and shortness of breath   Outpatient Encounter Medications as of 10/11/2020  Medication Sig  . benazepril (LOTENSIN) 20 MG tablet Take 1 tablet (20 mg total) by mouth  daily.  Marland Kitchen desvenlafaxine (PRISTIQ) 50 MG 24 hr tablet Take 1 tablet (50 mg total) by mouth daily.  Marland Kitchen donepezil (ARICEPT) 10 MG tablet Take 1 tablet (10 mg total) by mouth at bedtime.  . Multiple Vitamin (MULTIVITAMIN) capsule Take by mouth.  . rivaroxaban (XARELTO) 20 MG TABS tablet Take 1 tablet (20 mg total) by mouth daily.  . rosuvastatin (CRESTOR) 40 MG tablet Take 1 tablet (40 mg total) by mouth daily.  . Loratadine 10 MG CAPS Take by mouth daily.   No facility-administered encounter medications on file as of 10/11/2020.   Patient Care Team    Relationship Specialty Notifications Start End  Rodney Olp, MD PCP - General Family Medicine  07/26/14   Cameron Sprang, MD Consulting Physician Neurology  09/28/19   Madelin Rear, [facility] Pharmacist Pharmacist  08/24/20    Comment: [phone]   Current Diagnosis/Assessment: Goals Addressed            This Visit's Progress   . PharmD Care Plan       CARE PLAN ENTRY (see longitudinal plan of care for additional care plan information)  Current Barriers:  . Chronic Disease Management support, education, and care coordination needs related to Hypertension, Hyperlipidemia, and smoking cessation.   Hypertension BP Readings from Last 3 Encounters:  09/22/20 132/86  08/28/20 116/76  03/15/20 130/82   . Pharmacist Clinical Goal(s): o Over the next 365 days, patient will work with PharmD and providers to maintain BP goal <130/80 . Current regimen:  o Beneazapril 20  mg once daily . Interventions: o We discussed diet/exercise - Maintain a healthy weight and exercise regularly, as directed by your health care provider. Eat healthy foods, such as: Lean proteins, complex carbohydrates, fresh fruits and vegetables, low-fat dairy products, healthy fats . Patient self care activities - Over the next 365 days, patient will: o Check BP at least once every 1-2 weeks, document, and provide at future appointments o Ensure daily salt intake <  2300 mg/day  Hyperlipidemia Lab Results  Component Value Date/Time   LDLCALC 88 03/15/2020 10:07 AM   LDLDIRECT 86.0 09/13/2019 09:15 AM   . Pharmacist Clinical Goal(s): o Over the next 365 days, patient will work with PharmD and providers to maintain LDL goal < 100 . Current regimen:  o Rosuvastatin 40 mg once daily . Interventions: o Reviewed diet/exercise - see htn . Patient self care activities - Over the next 365 days, patient will: o Continue current management  Smoking cessation . Pharmacist Clinical Goal(s) o Over the next 90 days, patient will work with PharmD and providers to come up with plan to reduce number of cigarettes per day - current just shy one 1 pack ~18 cigarettes per day. . Current regimen:  o N/a . Interventions: o Counseled on smoking cessation  o Agreed and very much open to discuss at follow-up visit.  . Patient self care activities - Over the next 90 days, patient will: o Consider cutting down on packs per day - ideally would want to get to half pack per day then discuss bringing nicotine replacement therapies onboard  Medication management . Pharmacist Clinical Goal(s): o Over the next 365 days, patient will work with PharmD and providers to maintain optimal medication adherence . Current pharmacy: [facility] . Interventions o Comprehensive medication review performed. o Utilize [facility] pharmacy for medication synchronization, packaging and delivery . Patient self care activities - Over the next 365 days, patient will: o Take medications as prescribed o Report any questions or concerns to PharmD and/or provider(s) Initial goal documentation.      Hypertension   BP goal <130/80  BP Readings from Last 3 Encounters:  09/22/20 132/86  08/28/20 116/76  03/15/20 130/82    BMP Latest Ref Rng & Units 09/22/2020 03/15/2020 09/13/2019  Glucose 65 - 99 mg/dL 84 93 87  BUN 7 - 25 mg/dL $Remove'19 18 21  'TxErHpv$ Creatinine 0.70 - 1.25 mg/dL 0.87 0.93 0.88  BUN/Creat  Ratio 6 - 22 (calc) NOT APPLICABLE - -  Sodium 109 - 146 mmol/L 142 139 140  Potassium 3.5 - 5.3 mmol/L 4.6 4.6 4.8  Chloride 98 - 110 mmol/L 106 106 105  CO2 20 - 32 mmol/L $RemoveB'30 28 29  'OcluLGEx$ Calcium 8.6 - 10.3 mg/dL 9.7 9.4 9.5   Trying to limit salt, balanced intake of fruits and vegetables.  Reports to be walking few times a week for exercise, active with job [employer] and outside work. Patient checks BP at home infrequently. Recent home readings: n/a.  Denies dizziness. Patient is currently at goal on the following medications:  . Benazapril 20 mg once daily  We discussed diet/exercise - Maintain a healthy weight and exercise regularly, as directed by your health care provider. Eat healthy foods, such as: Lean proteins, complex carbohydrates, fresh fruits and vegetables, low-fat dairy products, healthy fats.  Plan  Continue current medications.  Hyperlipidemia   LDL goal < 100  Lipid Panel     Component Value Date/Time   CHOL 157 03/15/2020 1007   TRIG  98.0 03/15/2020 1007   HDL 49.30 03/15/2020 1007   LDLCALC 88 03/15/2020 1007   LDLDIRECT 86.0 09/13/2019 0915    Hepatic Function Latest Ref Rng & Units 09/22/2020 03/15/2020 09/13/2019  Total Protein 6.1 - 8.1 g/dL 5.9(L) 5.9(L) 5.9(L)  Albumin 3.5 - 5.2 g/dL - 4.0 3.6  AST 10 - 35 U/L $Remo'20 18 20  'ADzwn$ ALT 9 - 46 U/L $Remo'24 20 23  'zqEEN$ Alk Phosphatase 39 - 117 U/L - 56 57  Total Bilirubin 0.2 - 1.2 mg/dL 0.3 0.4 0.4  Bilirubin, Direct 0.0 - 0.3 mg/dL - - -    The 10-year ASCVD risk score Mikey Bussing DC Jr., et al., 2013) is: 21.4%   Values used to calculate the score:     Age: 63 years     Sex: Male     Is Non-Hispanic African American: No     Diabetic: No     Tobacco smoker: Yes     Systolic Blood Pressure: 035 mmHg     Is BP treated: Yes     HDL Cholesterol: 49.3 mg/dL     Total Cholesterol: 157 mg/dL   No side effects noted, tolerating well. Primary prevention. Patient is currently at goal the following medications:   . Rosuvastatin 40 mg once daily  Diet/exercise - see htn.   Plan  Continue current medications.  Nicotine Abuse     Social History   Tobacco Use  Smoking Status Current Every Day Smoker  . Packs/day: 0.25  . Years: 45.00  . Pack years: 11.25  . Types: Cigarettes  Smokeless Tobacco Never Used  previous medications: NRT (patches), bupropion, chantix.  Patient reports to be smoking almost pack per day ~18 cigarettes 10/11/2020. States feeling fine with breathing/no cough after 40+ years of smoking so risks of continuing seem unclear. A concern with quitting is weight gain.  Patient smokes Within 30 minutes of waking. Patient triggers include:driving, coffee driving and drinking coffee. On a scale of 1-10, reports MOTIVATION to quit is 5. Previous quit attempts: 3  Counseled on smoking cessation - agreed and very much open to discuss at follow-up visit.   Plan  Discuss smoking cessation at May 2022 f/u visit.   DVT, PE Hx. 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   Cost is ongoing concern. Denies any abnormal bruising, bleeding from nose or gums or blood in urine or stool.  Patient is currently controlled on the following medications:  . Xarelto 20 mg once daily   We discussed Xarelto patient assistance - will move forward with application.   Plan  Continue current medications.  Depression   PHQ9 SCORE ONLY 09/22/2020 03/15/2020 09/28/2019  PHQ-9 Total Score 1 1 0  Mild cognitive impairment - on donepezil followed by Dr. Tomi Likens. Previous medications: bupropion.  Denies side effects, reports ongoing benefit of taking medication. Patient is currently controlled on the following medications:  . Desvenlafaxine (Pristiq) 50 mg once daily   We discussed patient assistance - agreed to apply to Pristiq patient assistance application.  Plan  Continue current medications.  Vaccines   Immunization History  Administered Date(s) Administered  . Fluad Quad(high Dose 65+)  07/22/2020  . Influenza Split 07/26/2011, 08/07/2012  . Influenza Whole 07/31/2007, 07/15/2008, 07/07/2009, 06/29/2010  . Influenza, High Dose Seasonal PF 09/08/2017, 07/15/2018, 07/10/2019  . Influenza,inj,Quad PF,6+ Mos 08/20/2013, 07/26/2014, 06/15/2015, 07/05/2016  . Influenza-Unspecified 07/10/2019  . PFIZER SARS-COV-2 Vaccination 11/03/2019, 11/24/2019, 07/22/2020  . Pneumococcal Conjugate-13 06/05/2017  . Pneumococcal Polysaccharide-23 06/22/2010, 07/15/2018  .  Td 10/14/2001  . Tdap 01/03/2012  . Zoster Recombinat (Shingrix) 07/10/2019, 09/29/2019   Reviewed and discussed patient's vaccination history.    Plan  No recommendations at this time.   Medication Management / Care Coordination   Receives prescription medications from:  [place], [place] - [address] [place] [address] [place] [address] Phone: [phone] Fax: [phone]  [id number] - [place], [place] [address] [address] Phone: [phone] Fax: [phone]   Denies any issues with current medication management.   Plan  Continue current medication management strategy. ___________________________ SDOH (Social Determinants of Health) assessments performed: Yes. Future Appointments  Date Time Provider [place]  01/18/2021  1:00 PM Hazle Coca, PhD [facility] None  01/18/2021  2:00 PM [facility] NEUROPSYCH TECH [facility] None  01/25/2021  2:30 PM Hazle Coca, PhD [facility] None  02/27/2021  1:30 PM [facility] CCM PHARMACIST [facility]  04/09/2021 10:40 AM Rodney Olp, MD [facility]  05/09/2021 10:00 AM Cameron Sprang, MD [facility] None   Visit follow-up:  . CPA follow-up: PAP for Xarelto, Pristiq. Marland Kitchen RPH follow-up: 3-5 month telephone visit, ensure PAP complete, smoking cessation.  Madelin Rear, Pharm.D., BCGP Clinical Pharmacist [facility] [phone]

## 2020-10-11 NOTE — Patient Instructions (Signed)
[name],  Thank you for taking the time to review your medications with me today.  I have included our care plan/goals in the following pages. Please review and call me at [phone] with any questions!  Thanks! Ellin Mayhew, Pharm.D., BCGP Clinical Pharmacist [facility] [phone]  Goals Addressed            This Visit's Progress   . PharmD Care Plan       CARE PLAN ENTRY (see longitudinal plan of care for additional care plan information)  Current Barriers:  . Chronic Disease Management support, education, and care coordination needs related to Hypertension, Hyperlipidemia, and smoking cessation.   Hypertension BP Readings from Last 3 Encounters:  09/22/20 132/86  08/28/20 116/76  03/15/20 130/82   . Pharmacist Clinical Goal(s): o Over the next 365 days, patient will work with PharmD and providers to maintain BP goal <130/80 . Current regimen:  o Beneazapril 20 mg once daily . Interventions: o We discussed diet/exercise - Maintain a healthy weight and exercise regularly, as directed by your health care provider. Eat healthy foods, such as: Lean proteins, complex carbohydrates, fresh fruits and vegetables, low-fat dairy products, healthy fats . Patient self care activities - Over the next 365 days, patient will: o Check BP at least once every 1-2 weeks, document, and provide at future appointments o Ensure daily salt intake < 2300 mg/day  Hyperlipidemia Lab Results  Component Value Date/Time   LDLCALC 88 03/15/2020 10:07 AM   LDLDIRECT 86.0 09/13/2019 09:15 AM   . Pharmacist Clinical Goal(s): o Over the next 365 days, patient will work with PharmD and providers to maintain LDL goal < 100 . Current regimen:  o Rosuvastatin 40 mg once daily . Interventions: o Reviewed diet/exercise - see htn . Patient self care activities - Over the next 365 days, patient will: o Continue current management  Smoking  cessation . Pharmacist Clinical Goal(s) o Over the next 90 days, patient will work with PharmD and providers to come up with plan to reduce number of cigarettes per day - current just shy one 1 pack ~18 cigarettes per day. . Current regimen:  o N/a . Interventions: o Counseled on smoking cessation  o Agreed and very much open to discuss at follow-up visit.  . Patient self care activities - Over the next 90 days, patient will: o Consider cutting down on packs per day - ideally would want to get to half pack per day then discuss bringing nicotine replacement therapies onboard  Medication management . Pharmacist Clinical Goal(s): o Over the next 365 days, patient will work with PharmD and providers to maintain optimal medication adherence . Current pharmacy: [facility] . Interventions o Comprehensive medication review performed. o Utilize [facility] pharmacy for medication synchronization, packaging and delivery . Patient self care activities - Over the next 365 days, patient will: o Take medications as prescribed o Report any questions or concerns to PharmD and/or provider(s) Initial goal documentation.      The patient verbalized understanding of instructions provided today and agreed to receive a mailed copy of patient instruction and/or educational materials. Telephone follow up appointment with pharmacy team member scheduled for: See next appointment with "Care Management Staff" under "What's Next" below.   Smoking Tobacco Information, Adult Smoking tobacco can be harmful to your health. Tobacco contains a poisonous (toxic), colorless chemical called nicotine. Nicotine is addictive. It changes the brain and can make it hard to  stop smoking. Tobacco also has other toxic chemicals that can hurt your body and raise your risk of many cancers. How can smoking tobacco affect me? Smoking tobacco puts you at risk for:  Cancer. Smoking is most commonly associated with lung cancer, but can also  lead to cancer in other parts of the body.  Chronic obstructive pulmonary disease (COPD). This is a long-term lung condition that makes it hard to breathe. It also gets worse over time.  High blood pressure (hypertension), heart disease, stroke, or heart attack.  Lung infections, such as pneumonia.  Cataracts. This is when the lenses in the eyes become clouded.  Digestive problems. This may include peptic ulcers, heartburn, and gastroesophageal reflux disease (GERD).  Oral health problems, such as gum disease and tooth loss.  Loss of taste and smell. Smoking can affect your appearance by causing:  Wrinkles.  Yellow or stained teeth, fingers, and fingernails. Smoking tobacco can also affect your social life, because:  It may be challenging to find places to smoke when away from home. Many workplaces, [employer], hotels, and public places are tobacco-free.  Smoking is expensive. This is due to the cost of tobacco and the long-term costs of treating health problems from smoking.  Secondhand smoke may affect those around you. Secondhand smoke can cause lung cancer, breathing problems, and heart disease. Children of smokers have a higher risk for: ? Sudden infant death syndrome (SIDS). ? Ear infections. ? Lung infections. If you currently smoke tobacco, quitting now can help you:  Lead a longer and healthier life.  Look, smell, breathe, and feel better over time.  Save money.  Protect others from the harms of secondhand smoke. What actions can I take to prevent health problems? Quit smoking   Do not start smoking. Quit if you already do.  Make a plan to quit smoking and commit to it. Look for programs to help you and ask your health care provider for recommendations and ideas.  Set a date and write down all the reasons you want to quit.  Let your friends and family know you are quitting so they can help and support you. Consider finding friends who also want to quit. It  can be easier to quit with someone else, so that you can support each other.  Talk with your health care provider about using nicotine replacement medicines to help you quit, such as gum, lozenges, patches, sprays, or pills.  Do not replace cigarette smoking with electronic cigarettes, which are commonly called e-cigarettes. The safety of e-cigarettes is not known, and some may contain harmful chemicals.  If you try to quit but return to smoking, stay positive. It is common to slip up when you first quit, so take it one day at a time.  Be prepared for cravings. When you feel the urge to smoke, chew gum or suck on hard candy. Lifestyle  Stay busy and take care of your body.  Drink enough fluid to keep your urine pale yellow.  Get plenty of exercise and eat a healthy diet. This can help prevent weight gain after quitting.  Monitor your eating habits. Quitting smoking can cause you to have a larger appetite than when you smoke.  Find ways to relax. Go out with friends or family to a movie or a restaurant where people do not smoke.  Ask your health care provider about having regular tests (screenings) to check for cancer. This may include blood tests, imaging tests, and other tests.  Find ways  to manage your stress, such as meditation, yoga, or exercise. Where to find support To get support to quit smoking, consider:  Asking your health care provider for more information and resources.  Taking classes to learn more about quitting smoking.  Looking for local organizations that offer resources about quitting smoking.  Joining a support group for people who want to quit smoking in your local community.  Calling the smokefree.gov counselor helpline: 1-800-Quit-Now (819) 659-6145) Where to find more information You may find more information about quitting smoking from:  HelpGuide.org: www.helpguide.org  https://hall.com/: smokefree.gov  American Lung Association: www.lung.org Contact  a health care provider if you:  Have problems breathing.  Notice that your lips, nose, or fingers turn blue.  Have chest pain.  Are coughing up blood.  Feel faint or you pass out.  Have other health changes that cause you to worry. Summary  Smoking tobacco can negatively affect your health, the health of those around you, your finances, and your social life.  Do not start smoking. Quit if you already do. If you need help quitting, ask your health care provider.  Think about joining a support group for people who want to quit smoking in your local community. There are many effective programs that will help you to quit this behavior. This information is not intended to replace advice given to you by your health care provider. Make sure you discuss any questions you have with your health care provider. Document Revised: 06/25/2019 Document Reviewed: 10/15/2016 Elsevier Patient Education  2020 [employer].

## 2020-10-12 ENCOUNTER — Telehealth: Payer: Self-pay

## 2020-10-12 NOTE — Progress Notes (Unsigned)
    Chronic Care Management Pharmacy Assistant   Name: Rodney Scott  MRN: [id number] DOB: 02-Apr-1951  Reason for Encounter: Patient Assistance Application  PCP : Shelva Majestic, MD  Allergies:   Allergies  Allergen Reactions  . Yellow Jacket Venom [Bee Venom] Shortness Of Breath    Had swelling localized, chest tightness, and shortness of breath    Medications: Outpatient Encounter Medications as of 10/12/2020  Medication Sig  . benazepril (LOTENSIN) 20 MG tablet Take 1 tablet (20 mg total) by mouth daily.  Marland Kitchen desvenlafaxine (PRISTIQ) 50 MG 24 hr tablet Take 1 tablet (50 mg total) by mouth daily.  Marland Kitchen donepezil (ARICEPT) 10 MG tablet Take 1 tablet (10 mg total) by mouth at bedtime.  . Loratadine 10 MG CAPS Take by mouth daily.  . Multiple Vitamin (MULTIVITAMIN) capsule Take by mouth.  . rivaroxaban (XARELTO) 20 MG TABS tablet Take 1 tablet (20 mg total) by mouth daily.  . rosuvastatin (CRESTOR) 40 MG tablet Take 1 tablet (40 mg total) by mouth daily.   No facility-administered encounter medications on file as of 10/12/2020.    Current Diagnosis: Patient Active Problem List   Diagnosis Date Noted  . Senile purpura ([facility]) 09/22/2020  . BPH associated with nocturia 03/10/2018  . Hyperglycemia 12/05/2016  . Left inguinal hernia 12/05/2016  . MCI (mild cognitive impairment) 06/15/2015  . Essential hypertension, benign 07/26/2014  . Current smoker 07/26/2014  . Depression, major, recurrent, in complete remission ([facility]) 03/24/2009  . 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 Osteoarthritis 07/15/2008  . Venous (peripheral) insufficiency 12/11/2007  . Hyperlipidemia 07/31/2007  . Factor V Leiden ([facility]) 07/31/2007  . Allergic rhinitis 07/31/2007  . DVT, HX OF 07/31/2007  . History of colonic polyps 07/31/2007   Left message to inform patient that I will be mailing out the patient assistance application for xarelto and pre   Follow-Up:  {[facility] CPA Follow-up:24147}

## 2020-10-14 ENCOUNTER — Other Ambulatory Visit: Payer: Self-pay | Admitting: Family Medicine

## 2020-10-18 DIAGNOSIS — Z20822 Contact with and (suspected) exposure to covid-19: Secondary | ICD-10-CM | POA: Diagnosis not present

## 2020-10-21 DIAGNOSIS — Z1152 Encounter for screening for COVID-19: Secondary | ICD-10-CM | POA: Diagnosis not present

## 2020-10-21 DIAGNOSIS — Z20822 Contact with and (suspected) exposure to covid-19: Secondary | ICD-10-CM | POA: Diagnosis not present

## 2020-10-30 ENCOUNTER — Telehealth: Payer: Self-pay

## 2020-10-30 NOTE — Chronic Care Management (AMB) (Signed)
    Chronic Care Management Pharmacy Assistant   Name: Rodney Scott  MRN: [id number] DOB: Oct 22, 1950  Reason for Encounter: Patient Assistance Application  PCP : Marin Olp, MD  Allergies:   Allergies  Allergen Reactions  . Yellow Jacket Venom [Bee Venom] Shortness Of Breath    Had swelling localized, chest tightness, and shortness of breath    Medications: Outpatient Encounter Medications as of 10/30/2020  Medication Sig  . benazepril (LOTENSIN) 20 MG tablet Take 1 tablet by mouth once daily  . desvenlafaxine (PRISTIQ) 50 MG 24 hr tablet Take 1 tablet (50 mg total) by mouth daily.  Marland Kitchen donepezil (ARICEPT) 10 MG tablet Take 1 tablet (10 mg total) by mouth at bedtime.  . Loratadine 10 MG CAPS Take by mouth daily.  . Multiple Vitamin (MULTIVITAMIN) capsule Take by mouth.  . rivaroxaban (XARELTO) 20 MG TABS tablet Take 1 tablet (20 mg total) by mouth daily.  . rosuvastatin (CRESTOR) 40 MG tablet Take 1 tablet (40 mg total) by mouth daily.   No facility-administered encounter medications on file as of 10/30/2020.    Current Diagnosis: Patient Active Problem List   Diagnosis Date Noted  . Senile purpura ([place]) 09/22/2020  . BPH associated with nocturia 03/10/2018  . Hyperglycemia 12/05/2016  . Left inguinal hernia 12/05/2016  . MCI (mild cognitive impairment) 06/15/2015  . Essential hypertension, benign 07/26/2014  . Current smoker 07/26/2014  . Depression, major, recurrent, in complete remission ([place]) 03/24/2009  . 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 Osteoarthritis 07/15/2008  . Venous (peripheral) insufficiency 12/11/2007  . Hyperlipidemia 07/31/2007  . Factor V Leiden ([place]) 07/31/2007  . Allergic rhinitis 07/31/2007  . DVT, HX OF 07/31/2007  . History of colonic polyps 07/31/2007   I called and spoke with the patient to follow up on his patient assistance applications for Xarelto and Pristiq. Patient states he picked up the applications, he hasn't  filled them out yet. Patient states he will complete them and return them to the office sometime this week when the roads clear up.  April D Calhoun, [place] Pharmacist Assistant [phone]  Follow-Up:  Pharmacist Review

## 2020-11-10 ENCOUNTER — Other Ambulatory Visit: Payer: Self-pay

## 2020-11-10 ENCOUNTER — Ambulatory Visit (INDEPENDENT_AMBULATORY_CARE_PROVIDER_SITE_OTHER): Payer: BC Managed Care – PPO

## 2020-11-10 VITALS — BP 110/64 | HR 78 | Temp 98.0°F | Resp 20 | Wt 230.6 lb

## 2020-11-10 DIAGNOSIS — Z0000 Encounter for general adult medical examination without abnormal findings: Secondary | ICD-10-CM

## 2020-11-10 NOTE — Progress Notes (Signed)
Subjective:   Marta AntuKarl E Storck is a 70 y.o. male who presents for Medicare Annual/Subsequent preventive examination.  Review of Systems     Cardiac Risk Factors include: advanced age (>5355men, 45>65 women);hypertension;dyslipidemia;male gender;smoking/ tobacco exposure     Objective:    Today's Vitals   11/10/20 0759 11/10/20 0842  BP:  110/64  Pulse:  78  Resp:  20  Temp:  98 F (36.7 C)  SpO2:  99%  Weight:  230 lb 9.6 oz (104.6 kg)  PainSc: 5     Body mass index is 27.35 kg/m.  Advanced Directives 11/10/2020 08/28/2020 11/26/2019 09/28/2019 04/09/2019 09/25/2018 02/13/2015  Does Patient Have a Medical Advance Directive? No Yes No No No No Yes;No  Type of Advance Directive - Healthcare Power of CardwellAttorney;Living will - - - - -  Would patient like information on creating a medical advance directive? No - Patient declined - - Yes (MAU/Ambulatory/Procedural Areas - Information given) - No - Patient declined No - patient declined information    Current Medications (verified) Outpatient Encounter Medications as of 11/10/2020  Medication Sig  . benazepril (LOTENSIN) 20 MG tablet Take 1 tablet by mouth once daily  . desvenlafaxine (PRISTIQ) 50 MG 24 hr tablet Take 1 tablet (50 mg total) by mouth daily.  Marland Kitchen. donepezil (ARICEPT) 10 MG tablet Take 1 tablet (10 mg total) by mouth at bedtime.  . Loratadine 10 MG CAPS Take by mouth daily.  . Multiple Vitamin (MULTIVITAMIN) capsule Take by mouth.  . rivaroxaban (XARELTO) 20 MG TABS tablet Take 1 tablet (20 mg total) by mouth daily.  . rosuvastatin (CRESTOR) 40 MG tablet Take 1 tablet (40 mg total) by mouth daily.   No facility-administered encounter medications on file as of 11/10/2020.    Allergies (verified) Yellow jacket venom [bee venom]   History: Past Medical History:  Diagnosis Date  . Allergic rhinitis   . Allergy   . Clotting disorder (HCC)   . DVT (deep venous thrombosis) (HCC)   . Factor V deficiency (HCC)   .  Hyperlipidemia   . Hypertension   . Internal hemorrhoid 11/24/2009   No bleeding. Patient states thought was external.     . Osteoarthritis   . Right rotator cuff tear 12/07/2010   S/p surgery.    . Tubular adenoma of colon 11/2006   Past Surgical History:  Procedure Laterality Date  . right rotator cuff repair    . SALIVARY STONE REMOVAL     Family History  Problem Relation Age of Onset  . Colon cancer Father 760  . Factor V Leiden deficiency Son   . Esophageal cancer Neg Hx   . Rectal cancer Neg Hx   . Stomach cancer Neg Hx    Social History   Socioeconomic History  . Marital status: Married    Spouse name: Not on file  . Number of children: 2  . Years of education: Not on file  . Highest education level: Some college, no degree  Occupational History  . Not on file  Tobacco Use  . Smoking status: Current Every Day Smoker    Packs/day: 0.25    Years: 45.00    Pack years: 11.25    Types: Cigarettes  . Smokeless tobacco: Never Used  Vaping Use  . Vaping Use: Never used  Substance and Sexual Activity  . Alcohol use: Not Currently    Alcohol/week: 14.0 standard drinks    Types: 14 Standard drinks or equivalent per week  . Drug  use: No  . Sexual activity: Yes  Other Topics Concern  . Not on file  Social History Narrative   Married (wife patient of Dr. Yong Channel). Son and daughter. 1 granddaughter.       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 Difficulty of Paying Living Expenses: Not hard at all  Food Insecurity: No Food Insecurity  . Worried About Charity fundraiser in the Last Year: Never true  . Ran Out of Food in the  Last Year: Never true  Transportation Needs: No Transportation Needs  . Lack of Transportation (Medical): No  . Lack of Transportation (Non-Medical): No  Physical Activity: Inactive  . Days of Exercise per Week: 0 days  . Minutes of Exercise per Session: 0 min  Stress: No Stress Concern Present  . Feeling of Stress : Not at all  Social Connections: Moderately Isolated  . Frequency of Communication with Friends and Family: Twice a week  . Frequency of Social Gatherings with Friends and Family: Once a week  . Attends Religious Services: Never  . Active Member of Clubs or Organizations: No  . Attends Archivist Meetings: Never  . Marital Status: Married    Tobacco Counseling Ready to quit: Not Answered Counseling given: Not Answered   Clinical Intake:  Pre-visit preparation completed: Yes  Pain : 0-10 Pain Score: 5  Pain Type: Acute pain Pain Location: Head Pain Orientation: Left Pain Descriptors / Indicators: Aching Pain Onset: In the past 7 days Pain Frequency: Several days a week     BMI - recorded: 27.35 Nutritional Status: BMI 25 -29 Overweight Nutritional Risks: None Diabetes: No  How often do you need to have someone help you when you read instructions, pamphlets, or other written materials from your doctor or pharmacy?: 1 - Never  Diabetic?No  Interpreter Needed?: No  Information entered by :: Charlott Rakes, LPN   Activities of Daily Living In your present state of health, do you have any difficulty performing the following activities: 11/10/2020  Hearing? N  Vision? N  Difficulty concentrating or making decisions? Y  Comment short term memory issues  Walking or climbing stairs? N  Dressing or bathing? N  Doing errands, shopping? N  Preparing Food and eating ? N  Using the Toilet? N  In the past six months, have you accidently leaked urine? N  Do you have problems with loss of bowel control? N  Managing your Medications? N  Managing  your Finances? N  Housekeeping or managing your Housekeeping? N  Some recent data might be hidden    Patient Care Team: Marin Olp, MD as PCP - General (Family Medicine) Cameron Sprang, MD as Consulting Physician (Neurology) Madelin Rear, [facility] as Pharmacist (Pharmacist)  Indicate any recent Medical Services you may have received from other than [facility] providers in the past year (date may be approximate).     Assessment:   This is a routine wellness examination for Jedrek.  Hearing/Vision screen  Hearing Screening   125Hz  250Hz  500Hz  1000Hz  2000Hz  3000Hz  4000Hz  6000Hz  8000Hz   Right ear:  Left ear:           Comments: Pt states no hearing loss   Vision Screening Comments: Pt hasn't had an exams in a few years , encouraged to to follow up and the importance   Dietary issues and exercise activities discussed: Current Exercise Habits: The patient has a physically strenuous job, but has no regular exercise apart from work.  Goals    . DIET - EAT MORE FRUITS AND VEGETABLES    . Increase physical activity    . Patient Stated     More active    . PharmD Care Plan     CARE PLAN ENTRY (see longitudinal plan of care for additional care plan information)  Current Barriers:  . Chronic Disease Management support, education, and care coordination needs related to Hypertension, Hyperlipidemia, and smoking cessation.   Hypertension BP Readings from Last 3 Encounters:  09/22/20 132/86  08/28/20 116/76  03/15/20 130/82   . Pharmacist Clinical Goal(s): o Over the next 365 days, patient will work with PharmD and providers to maintain BP goal <130/80 . Current regimen:  o Beneazapril 20 mg once daily . Interventions: o We discussed diet/exercise - Maintain a healthy weight and exercise regularly, as directed by your health care provider. Eat healthy foods, such as: Lean proteins, complex carbohydrates, fresh fruits and vegetables, low-fat dairy products, healthy  fats . Patient self care activities - Over the next 365 days, patient will: o Check BP at least once every 1-2 weeks, document, and provide at future appointments o Ensure daily salt intake < 2300 mg/day  Hyperlipidemia Lab Results  Component Value Date/Time   LDLCALC 88 03/15/2020 10:07 AM   LDLDIRECT 86.0 09/13/2019 09:15 AM   . Pharmacist Clinical Goal(s): o Over the next 365 days, patient will work with PharmD and providers to maintain LDL goal < 100 . Current regimen:  o Rosuvastatin 40 mg once daily . Interventions: o Reviewed diet/exercise - see htn . Patient self care activities - Over the next 365 days, patient will: o Continue current management  Smoking cessation . Pharmacist Clinical Goal(s) o Over the next 90 days, patient will work with PharmD and providers to come up with plan to reduce number of cigarettes per day - current just shy one 1 pack ~18 cigarettes per day. . Current regimen:  o N/a . Interventions: o Counseled on smoking cessation  o Agreed and very much open to discuss at follow-up visit.  . Patient self care activities - Over the next 90 days, patient will: o Consider cutting down on packs per day - ideally would want to get to half pack per day then discuss bringing nicotine replacement therapies onboard  Medication management . Pharmacist Clinical Goal(s): o Over the next 365 days, patient will work with PharmD and providers to maintain optimal medication adherence . Current pharmacy: [facility] . Interventions o Comprehensive medication review performed. o Utilize [facility] pharmacy for medication synchronization, packaging and delivery . Patient self care activities - Over the next 365 days, patient will: o Take medications as prescribed o Report any questions or concerns to PharmD and/or provider(s) Initial goal documentation.      Depression Screen PHQ 2/9 Scores 11/10/2020 09/22/2020 03/15/2020 09/28/2019 09/13/2019 03/12/2019 09/25/2018  PHQ  - 2 Score 0 0 0 0 1 1 0  PHQ- 9 Score - 1 1 - 1 1 0    Fall Risk Fall Risk  11/10/2020 08/28/2020 11/26/2019 09/28/2019 09/13/2019  Falls in the past year? 0  0 0 0 0  Comment - - - - -  Number falls in past yr: 0 0 0 - 0  Injury with Fall? 0 0 0 0 0  Risk for fall due to : Impaired vision;Impaired balance/gait - - - -  Follow up Falls prevention discussed - - Falls evaluation completed;Education provided;Falls prevention discussed -    FALL RISK PREVENTION PERTAINING TO THE HOME:  Any stairs in or around the home? Yes  If so, are there any without handrails? No  Home free of loose throw rugs in walkways, pet beds, electrical cords, etc? Yes  Adequate lighting in your home to reduce risk of falls? Yes   ASSISTIVE DEVICES UTILIZED TO PREVENT FALLS:  Life alert? No  Use of a cane, walker or w/c? No  Grab bars in the bathroom? No  Shower chair or bench in shower? No  Elevated toilet seat or a handicapped toilet? No   TIMED UP AND GO:  Was the test performed? Yes .  Length of time to ambulate 10 feet:   sec.   Gait steady and fast without use of assistive device  Cognitive Function: MMSE - Mini Mental State Exam 09/25/2018  Orientation to time 5  Orientation to Place 5  Registration 3  Attention/ Calculation 5  Recall 3  Language- name 2 objects 2  Language- repeat 1  Language- follow 3 step command 3  Language- read & follow direction 1  Write a sentence 1  Copy design 1  Total score 30   Montreal Cognitive Assessment  04/23/2019 12/14/2018 09/25/2018  Visuospatial/ Executive (0/5) 5 4 5   Naming (0/3) 3 3 3   Attention: Read list of digits (0/2) 2 2 2   Attention: Read list of letters (0/1) 0 1 1  Attention: Serial 7 subtraction starting at 100 (0/3) 3 3 3   Language: Repeat phrase (0/2) 2 2 2   Language : Fluency (0/1) 1 1 1   Abstraction (0/2) 2 2 2   Delayed Recall (0/5) 0 0 0  Orientation (0/6) 6 6 6   Total 24 24 25   Adjusted Score (based on education) 25 25 -    6CIT Screen 11/10/2020  What Year? 0 points  What month? 0 points  Count back from 20 0 points  Months in reverse 0 points  Repeat phrase 0 points    Immunizations Immunization History  Administered Date(s) Administered  . Fluad Quad(high Dose 65+) 07/22/2020  . Influenza Split 07/26/2011, 08/07/2012  . Influenza Whole 07/31/2007, 07/15/2008, 07/07/2009, 06/29/2010  . Influenza, High Dose Seasonal PF 09/08/2017, 07/15/2018, 07/10/2019  . Influenza,inj,Quad PF,6+ Mos 08/20/2013, 07/26/2014, 06/15/2015, 07/05/2016  . Influenza-Unspecified 07/10/2019  . PFIZER(Purple Top)SARS-COV-2 Vaccination 11/03/2019, 11/24/2019, 07/22/2020  . Pneumococcal Conjugate-13 06/05/2017  . Pneumococcal Polysaccharide-23 06/22/2010, 07/15/2018  . Td 10/14/2001  . Tdap 01/03/2012  . Zoster Recombinat (Shingrix) 07/10/2019, 09/29/2019    TDAP status: Up to date  Flu Vaccine status: Up to date  Pneumococcal vaccine status: Up to date  Covid-19 vaccine status: Completed vaccines  Qualifies for Shingles Vaccine? Yes   Zostavax completed Yes   Shingrix Completed?: Yes  Screening Tests Health Maintenance  Topic Date Due  . Hepatitis C Screening  11/11/2109 (Originally 1951-03-18)  . COLONOSCOPY (Pts 45-79yrs Insurance coverage will need to be confirmed)  09/24/2021  . TETANUS/TDAP  01/02/2022  . INFLUENZA VACCINE  Completed  . COVID-19 Vaccine  Completed  . PNA vac Low Risk Adult  Completed    Health Maintenance  There are no preventive care  reminders to display for this patient.  Colorectal cancer screening: Type of screening: Colonoscopy. Completed 09/24/18. Repeat every 3 years   Additional Screening:  Hepatitis C Screening: does qualify;   Vision Screening: Recommended annual ophthalmology exams for early detection of glaucoma and other disorders of the eye. Is the patient up to date with their annual eye exam?  No  Who is the provider or what is the name of the office in which  the patient attends annual eye exams? Pt stated he will follow up     Dental Screening: Recommended annual dental exams for proper oral hygiene  Community Resource Referral / Chronic Care Management: CRR required this visit?  No   CCM required this visit?  No      Plan:     I have personally reviewed and noted the following in the patient's chart:   . Medical and social history . Use of alcohol, tobacco or illicit drugs  . Current medications and supplements . Functional ability and status . Nutritional status . Physical activity . Advanced directives . List of other physicians . Hospitalizations, surgeries, and ER visits in previous 12 months . Vitals . Screenings to include cognitive, depression, and falls . Referrals and appointments  In addition, I have reviewed and discussed with patient certain preventive protocols, quality metrics, and best practice recommendations. A written personalized care plan for preventive services as well as general preventive health recommendations were provided to patient.     Willette Brace, LPN   [id number]   Nurse Notes: Pt complains of headache on left side stating he believes it's sinus he has an upcoming appt with you on 11/13/20. Pt was told if his symptoms worsen to go to ER or urgent care.

## 2020-11-10 NOTE — Patient Instructions (Addendum)
Rodney Scott , Thank you for taking time to come for your Medicare Wellness Visit. I appreciate your ongoing commitment to your health goals. Please review the following plan we discussed and let me know if I can assist you in the future.   Screening recommendations/referrals: Colonoscopy: Done 09/24/18 Recommended yearly ophthalmology/optometry visit for glaucoma screening and checkup Recommended yearly dental visit for hygiene and checkup  Vaccinations: Influenza vaccine: Done 07/22/20 Pneumococcal vaccine: Up to date Tdap vaccine: Up to date Shingles vaccine: Completed 9/26, & 09/29/19   Covid-19: Completed 1/20, 2/10, & 07/22/20  Advanced directives: Advance directive discussed with you today. Even though you declined this today please call our office should you change your mind and we can give you the proper paperwork for you to fill out.  Conditions/risks identified: More active  Next appointment: Follow up in one year for your annual wellness visit.   Preventive Care 87 Years and Older, Male Preventive care refers to lifestyle choices and visits with your health care provider that can promote health and wellness. What does preventive care include?  A yearly physical exam. This is also called an annual well check.  Dental exams once or twice a year.  Routine eye exams. Ask your health care provider how often you should have your eyes checked.  Personal lifestyle choices, including:  Daily care of your teeth and gums.  Regular physical activity.  Eating a healthy diet.  Avoiding tobacco and drug use.  Limiting alcohol use.  Practicing safe sex.  Taking low doses of aspirin every day.  Taking vitamin and mineral supplements as recommended by your health care provider. What happens during an annual well check? The services and screenings done by your health care provider during your annual well check will depend on your age, overall health, lifestyle risk factors,  and family history of disease. Counseling  Your health care provider may ask you questions about your:  Alcohol use.  Tobacco use.  Drug use.  Emotional well-being.  Home and relationship well-being.  Sexual activity.  Eating habits.  History of falls.  Memory and ability to understand (cognition).  Work and work Statistician. Screening  You may have the following tests or measurements:  Height, weight, and BMI.  Blood pressure.  Lipid and cholesterol levels. These may be checked every 5 years, or more frequently if you are over 57 years old.  Skin check.  Lung cancer screening. You may have this screening every year starting at age 28 if you have a 30-pack-year history of smoking and currently smoke or have quit within the past 15 years.  Fecal occult blood test (FOBT) of the stool. You may have this test every year starting at age 41.  Flexible sigmoidoscopy or colonoscopy. You may have a sigmoidoscopy every 5 years or a colonoscopy every 10 years starting at age 54.  Prostate cancer screening. Recommendations will vary depending on your family history and other risks.  Hepatitis C blood test.  Hepatitis B blood test.  Sexually transmitted disease (STD) testing.  Diabetes screening. This is done by checking your blood sugar (glucose) after you have not eaten for a while (fasting). You may have this done every 1-3 years.  Abdominal aortic aneurysm (AAA) screening. You may need this if you are a current or former smoker.  Osteoporosis. You may be screened starting at age 62 if you are at high risk. Talk with your health care provider about your test results, treatment options, and if necessary, the need  for more tests. Vaccines  Your health care provider may recommend certain vaccines, such as:  Influenza vaccine. This is recommended every year.  Tetanus, diphtheria, and acellular pertussis (Tdap, Td) vaccine. You may need a Td booster every 10  years.  Zoster vaccine. You may need this after age 31.  Pneumococcal 13-valent conjugate (PCV13) vaccine. One dose is recommended after age 84.  Pneumococcal polysaccharide (PPSV23) vaccine. One dose is recommended after age 53. Talk to your health care provider about which screenings and vaccines you need and how often you need them. This information is not intended to replace advice given to you by your health care provider. Make sure you discuss any questions you have with your health care provider. Document Released: 10/27/2015 Document Revised: 06/19/2016 Document Reviewed: 08/01/2015 Elsevier Interactive Patient Education  2017 [place] Prevention in the Home Falls can cause injuries. They can happen to people of all ages. There are many things you can do to make your home safe and to help prevent falls. What can I do on the outside of my home?  Regularly fix the edges of walkways and driveways and fix any cracks.  Remove anything that might make you trip as you walk through a door, such as a raised step or threshold.  Trim any bushes or trees on the path to your home.  Use bright outdoor lighting.  Clear any walking paths of anything that might make someone trip, such as rocks or tools.  Regularly check to see if handrails are loose or broken. Make sure that both sides of any steps have handrails.  Any raised decks and porches should have guardrails on the edges.  Have any leaves, snow, or ice cleared regularly.  Use sand or salt on walking paths during winter.  Clean up any spills in your garage right away. This includes oil or grease spills. What can I do in the bathroom?  Use night lights.  Install grab bars by the toilet and in the tub and shower. Do not use towel bars as grab bars.  Use non-skid mats or decals in the tub or shower.  If you need to sit down in the shower, use a plastic, non-slip stool.  Keep the floor dry. Clean up any water that  spills on the floor as soon as it happens.  Remove soap buildup in the tub or shower regularly.  Attach bath mats securely with double-sided non-slip rug tape.  Do not have throw rugs and other things on the floor that can make you trip. What can I do in the bedroom?  Use night lights.  Make sure that you have a light by your bed that is easy to reach.  Do not use any sheets or blankets that are too big for your bed. They should not hang down onto the floor.  Have a firm chair that has side arms. You can use this for support while you get dressed.  Do not have throw rugs and other things on the floor that can make you trip. What can I do in the kitchen?  Clean up any spills right away.  Avoid walking on wet floors.  Keep items that you use a lot in easy-to-reach places.  If you need to reach something above you, use a strong step stool that has a grab bar.  Keep electrical cords out of the way.  Do not use floor polish or wax that makes floors slippery. If you must use wax, use  non-skid floor wax.  Do not have throw rugs and other things on the floor that can make you trip. What can I do with my stairs?  Do not leave any items on the stairs.  Make sure that there are handrails on both sides of the stairs and use them. Fix handrails that are broken or loose. Make sure that handrails are as long as the stairways.  Check any carpeting to make sure that it is firmly attached to the stairs. Fix any carpet that is loose or worn.  Avoid having throw rugs at the top or bottom of the stairs. If you do have throw rugs, attach them to the floor with carpet tape.  Make sure that you have a light switch at the top of the stairs and the bottom of the stairs. If you do not have them, ask someone to add them for you. What else can I do to help prevent falls?  Wear shoes that:  Do not have high heels.  Have rubber bottoms.  Are comfortable and fit you well.  Are closed at the  toe. Do not wear sandals.  If you use a stepladder:  Make sure that it is fully opened. Do not climb a closed stepladder.  Make sure that both sides of the stepladder are locked into place.  Ask someone to hold it for you, if possible.  Clearly mark and make sure that you can see:  Any grab bars or handrails.  First and last steps.  Where the edge of each step is.  Use tools that help you move around (mobility aids) if they are needed. These include:  Canes.  Walkers.  Scooters.  Crutches.  Turn on the lights when you go into a dark area. Replace any light bulbs as soon as they burn out.  Set up your furniture so you have a clear path. Avoid moving your furniture around.  If any of your floors are uneven, fix them.  If there are any pets around you, be aware of where they are.  Review your medicines with your doctor. Some medicines can make you feel dizzy. This can increase your chance of falling. Ask your doctor what other things that you can do to help prevent falls. This information is not intended to replace advice given to you by your health care provider. Make sure you discuss any questions you have with your health care provider. Document Released: 07/27/2009 Document Revised: 03/07/2016 Document Reviewed: 11/04/2014 Elsevier Interactive Patient Education  2017 [employer].

## 2020-11-12 NOTE — Patient Instructions (Signed)
Depression screen [facility] 2/9 11/10/2020 09/22/2020 03/15/2020  Decreased Interest 0 0 0  Down, Depressed, Hopeless 0 0 0  PHQ - 2 Score 0 0 0  Altered sleeping - 0 0  Tired, decreased energy - 1 1  Change in appetite - 0 0  Feeling bad or failure about yourself  - 0 0  Trouble concentrating - 0 0  Moving slowly or fidgety/restless - 0 0  Suicidal thoughts - 0 0  PHQ-9 Score - 1 1  Difficult doing work/chores - Not difficult at all Not difficult at all

## 2020-11-12 NOTE — Progress Notes (Signed)
Phone [phone] Virtual visit via Video note   Subjective:  Chief complaint: Chief Complaint  Patient presents with  . Headache    Has had this left sided headache for the last 4 or 5 days.    This visit type was conducted due to national recommendations for restrictions regarding the COVID-19 Pandemic (e.g. social distancing).  This format is felt to be most appropriate for this patient at this time balancing risks to patient and risks to population by having him in for in person visit.  No physical exam was performed (except for noted visual exam or audio findings with Telehealth visits).    Our team/I connected with Neysa Hotter at  8:40 AM EST by a video enabled telemedicine application ([email or web address] or caregility through epic) and verified that I am speaking with the correct person using two identifiers.  Location patient: [place] Location provider: [facility] [place], office Persons participating in the virtual visit:  patient  Our team/I discussed the limitations of evaluation and management by telemedicine and the availability of in person appointments. In light of current covid-19 pandemic, patient also understands that we are trying to protect them by minimizing in office contact if at all possible.  The patient expressed consent for telemedicine visit and agreed to proceed. Patient understands insurance will be billed.   Past Medical History-  Patient Active Problem List   Diagnosis Date Noted  . MCI (mild cognitive impairment) 06/15/2015    Priority: High  . Current smoker 07/26/2014    Priority: High  . Factor V Leiden ([place]) 07/31/2007    Priority: High  . DVT, HX OF 07/31/2007    Priority: High  . BPH associated with nocturia 03/10/2018    Priority: Medium  . Hyperglycemia 12/05/2016    Priority: Medium  . Essential hypertension, benign 07/26/2014    Priority: Medium  . Depression, major, recurrent, in complete remission ([place]) 03/24/2009    Priority: Medium  .  Hyperlipidemia 07/31/2007    Priority: Medium  . Senile purpura ([place]) 09/22/2020    Priority: Low  . Left inguinal hernia 12/05/2016    Priority: Low  . 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 Osteoarthritis 07/15/2008    Priority: Low  . Venous (peripheral) insufficiency 12/11/2007    Priority: Low  . Allergic rhinitis 07/31/2007    Priority: Low  . History of colonic polyps 07/31/2007    Priority: Low    Medications- reviewed and updated Current Outpatient Medications  Medication Sig Dispense Refill  . benazepril (LOTENSIN) 20 MG tablet Take 1 tablet by mouth once daily 90 tablet 0  . desvenlafaxine (PRISTIQ) 50 MG 24 hr tablet Take 1 tablet (50 mg total) by mouth daily. 90 tablet 3  . donepezil (ARICEPT) 10 MG tablet Take 1 tablet (10 mg total) by mouth at bedtime. 90 tablet 3  . Loratadine 10 MG CAPS Take by mouth daily.    . Multiple Vitamin (MULTIVITAMIN) capsule Take by mouth.    . rivaroxaban (XARELTO) 20 MG TABS tablet Take 1 tablet (20 mg total) by mouth daily. 90 tablet 3  . rosuvastatin (CRESTOR) 40 MG tablet Take 1 tablet (40 mg total) by mouth daily. 90 tablet 3   No current facility-administered medications for this visit.     Objective:  Ht 6\' 4"  (1.93 m)   Wt 235 lb (106.6 kg)   BMI 28.61 kg/m  self reported vitals Gen: NAD, resting comfortably Lungs: nonlabored, normal respiratory rate  Skin: appears  dry, no obvious rash Neuro: normal movement upper extremities- points to left side of face between nose and ear as source of pain     Assessment and Plan   Left Sided facial pain S: patient getting pain on left side of his face between his nostril and his left ear (cheek, below eye). No pain into lip/teeth or neck.  Describes as an aching sensation. No blurry vision above baseline. At first tylenol and walking around seemed to help. Happened 4 nights last week. On Sunday hit him sitting up watching football. Seemed like sinus  pressure at first- tried nasal spray OTC that did not help. Tylenol has remained helpful - kicks in within 30 minutes. Pain can be severe. No fall or injury. No recent dental work- other than a cleaning on 20th. No rash in area  More commonly happens at night and can wake him up but did occur in daytime yesterday. Commonly sleeping on that side when he wakes up or on his back.   Has not had much sinus congestion. No history of pain in this area. No history of headaches  Negative rapid test on Friday.   ROS- no fever/chills. No facial or extremity weakness. No slurred words or trouble swallowing. no blurry vision or double vision above baselien. No paresthesias. No confusion or word finding difficulties.   A/P: 70 year old male with left sided facial pain from nose to his ear. No changes in vision and no eye pain or dental pain- also had dental check just last week- doubt dental source. This sounds  Like trigeminal neuralgia- thrilled tylenol is helpful though. We will get MRI trigeminal nerve to further evaluate. Carbamazepine or oxcarbamezepine preferred but interaction with xarelto so we opted to try low dose gabapentin and asked him to update me Thursday or Friday. May need higher dose. Most of pain at night and may need to titrate dose up.  Unable to do full neuro exam due to video being method of visit. Did ask him to get PCR covid testing in addition to rapid as well as covid can cause headaches though I havent seen facial pain with it. Reports BP has been in good range when he has checked recently but did not have access to cuff at visit. Continue tylenol prn as helpful  # smoking under a pack a day- advised full cessation- not ready to quit  Recommended follow up:  As needed for acute concerns Future Appointments  Date Time Provider [place]  01/18/2021  1:00 PM Hazle Coca, PhD [facility] None  01/18/2021  2:00 PM [facility] [place] None  01/25/2021  2:30 PM Hazle Coca,  PhD [facility] None  02/27/2021  1:30 PM [facility] CCM PHARMACIST [facility]  04/09/2021 10:40 AM Marin Olp, MD [facility]  05/09/2021 10:00 AM Cameron Sprang, MD [facility] None  11/16/2021  8:45 AM [facility] HEALTH COACH [facility]    Lab/Order associations:   ICD-10-CM   1. Trigeminal neuralgia of left side of face  G50.0 MR FACE/TRIGEMINAL W/CM  2. Left facial pain  R51.9 MR FACE/TRIGEMINAL W/CM    Meds ordered this encounter  Medications  . gabapentin (NEURONTIN) 100 MG capsule    Sig: Take 1 capsule (100 mg total) by mouth 2 (two) times daily as needed.    Dispense:  60 capsule    Refill:  2   Time Spent: 30 minutes of total time (8:56 AM- 9:26 AM) was spent on the date of the encounter  performing the following actions: chart review prior to seeing the patient, obtaining history, performing a medically necessary exam, counseling on the treatment plan, placing orders, and documenting in our EHR.   Return precautions advised.  [name], MD

## 2020-11-13 ENCOUNTER — Telehealth (INDEPENDENT_AMBULATORY_CARE_PROVIDER_SITE_OTHER): Payer: BC Managed Care – PPO | Admitting: Family Medicine

## 2020-11-13 ENCOUNTER — Encounter: Payer: Self-pay | Admitting: Family Medicine

## 2020-11-13 VITALS — Ht 76.0 in | Wt 235.0 lb

## 2020-11-13 DIAGNOSIS — G500 Trigeminal neuralgia: Secondary | ICD-10-CM

## 2020-11-13 DIAGNOSIS — R519 Headache, unspecified: Secondary | ICD-10-CM

## 2020-11-13 MED ORDER — GABAPENTIN 100 MG PO CAPS: 100.0000 mg | ORAL_CAPSULE | Freq: Two times a day (BID) | ORAL | 2 refills | Status: DC | PRN

## 2020-11-19 ENCOUNTER — Other Ambulatory Visit: Payer: Self-pay | Admitting: Family Medicine

## 2020-11-25 ENCOUNTER — Other Ambulatory Visit: Payer: Self-pay | Admitting: Family Medicine

## 2020-11-29 ENCOUNTER — Other Ambulatory Visit: Payer: Self-pay

## 2020-11-29 ENCOUNTER — Ambulatory Visit (HOSPITAL_COMMUNITY)
Admission: RE | Admit: 2020-11-29 | Discharge: 2020-11-29 | Disposition: A | Discharge status: Home / Self Care | Payer: BC Managed Care – PPO | Source: Ambulatory Visit | Attending: Family Medicine | Admitting: Family Medicine

## 2020-11-29 DIAGNOSIS — M2669 Other specified disorders of temporomandibular joint: Secondary | ICD-10-CM | POA: Diagnosis not present

## 2020-11-29 DIAGNOSIS — M47812 Spondylosis without myelopathy or radiculopathy, cervical region: Secondary | ICD-10-CM | POA: Diagnosis not present

## 2020-11-29 DIAGNOSIS — G500 Trigeminal neuralgia: Secondary | ICD-10-CM | POA: Diagnosis not present

## 2020-11-29 DIAGNOSIS — R519 Headache, unspecified: Secondary | ICD-10-CM | POA: Diagnosis not present

## 2020-11-29 DIAGNOSIS — R6884 Jaw pain: Secondary | ICD-10-CM | POA: Diagnosis not present

## 2020-11-29 DIAGNOSIS — J3489 Other specified disorders of nose and nasal sinuses: Secondary | ICD-10-CM | POA: Diagnosis not present

## 2020-11-29 MED ORDER — GADOBUTROL 1 MMOL/ML IV SOLN
10.0000 mL | Freq: Once | INTRAVENOUS | Status: AC | PRN
  Administered 2020-11-29: 10 mL via INTRAVENOUS

## 2020-12-05 ENCOUNTER — Telehealth: Payer: Self-pay

## 2020-12-05 NOTE — Telephone Encounter (Signed)
Called and spoke with pt and MRI results reviewed, pt is not seeing any improvement so he will call the office to schedule a f/u appointment.

## 2020-12-05 NOTE — Telephone Encounter (Signed)
Patient is calling in stating that he has not heard from anyone about his MRI results.

## 2020-12-06 NOTE — Patient Instructions (Addendum)
Please stop by lab before you go If you have mychart- we will send your results within 3 business days of [place] receiving them.  If you do not have mychart- we will call you about results within 5 business days of [place] receiving them.  *please also note that you will see labs on mychart as soon as they post. I will later go in and write notes on them- will say "notes from Dr. Yong Channel"  Unclear cause of pain but possible trigeminal neuralgia-  Increase gabapentin to 200 mg three times a day as long as it doesn't make you too sleepy  We will call you within two weeks about your referral to neurology (you can also call them to see if you can get in since you are an established patient with Dr. Delice Lesch. If you do not hear within 2 weeks, give [place] a call.    Team please recheck blood pressure and update under regular vitals section if controlled. If not controlled encourage home monitoring and if >135/85 at home have him schedule follow up within 3-4 weeks and bring log of home blood pressures

## 2020-12-06 NOTE — Progress Notes (Signed)
Phone [phone] In person visit   Subjective:   Rodney Scott is a 70 y.o. year old very pleasant male patient who presents for/with See problem oriented charting Chief Complaint  Patient presents with   discuss mri   This visit occurred during the SARS-CoV-2 public health emergency.  Safety protocols were in place, including screening questions prior to the visit, additional usage of staff PPE, and extensive cleaning of exam room while observing appropriate contact time as indicated for disinfecting solutions.   Past Medical History-  Patient Active Problem List   Diagnosis Date Noted   MCI (mild cognitive impairment) 06/15/2015    Priority: High   Current smoker 07/26/2014    Priority: High   Factor V Leiden ([place]) 07/31/2007    Priority: High   DVT, HX OF 07/31/2007    Priority: High   BPH associated with nocturia 03/10/2018    Priority: Medium   Hyperglycemia 12/05/2016    Priority: Medium   Essential hypertension, benign 07/26/2014    Priority: Medium   Depression, major, recurrent, in complete remission ([place]) 03/24/2009    Priority: Medium   Hyperlipidemia 07/31/2007    Priority: Medium   Senile purpura ([place]) 09/22/2020    Priority: Low   Left inguinal hernia 12/05/2016    Priority: Low   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    Priority: Low   Osteoarthritis 07/15/2008    Priority: Low   Venous (peripheral) insufficiency 12/11/2007    Priority: Low   Allergic rhinitis 07/31/2007    Priority: Low   History of colonic polyps 07/31/2007    Priority: Low    Medications- reviewed and updated Current Outpatient Medications  Medication Sig Dispense Refill   benazepril (LOTENSIN) 20 MG tablet Take 1 tablet by mouth once daily 90 tablet 0   desvenlafaxine (PRISTIQ) 50 MG 24 hr tablet Take 1 tablet by mouth once daily 90 tablet 0   donepezil (ARICEPT) 10 MG tablet Take 1 tablet (10 mg total) by mouth at bedtime. 90 tablet 3    Loratadine 10 MG CAPS Take by mouth daily.     Multiple Vitamin (MULTIVITAMIN) capsule Take by mouth.     rosuvastatin (CRESTOR) 40 MG tablet Take 1 tablet by mouth once daily 90 tablet 0   XARELTO 20 MG TABS tablet Take 1 tablet by mouth once daily 90 tablet 0   gabapentin (NEURONTIN) 100 MG capsule Take 2 capsules (200 mg total) by mouth 3 (three) times daily. 180 capsule 2   No current facility-administered medications for this visit.     Objective:  BP 140/84    Pulse 76    Temp (!) 97 F (36.1 C) (Temporal)    Ht $R'6\' 4"'Fq$  (1.93 m)    Wt 230 lb (104.3 kg)    SpO2 98%    BMI 28.00 kg/m  Gen: NAD, resting comfortably CV: RRR no murmurs rubs or gallops Lungs: CTAB no crackles, wheeze, rhonchi Ext: no edema Skin: warm, dry Neuro: CN II-XII intact, sensation and reflexes normal throughout, 5/5 muscle strength in bilateral upper and lower extremities. Normal finger to nose. Normal rapid alternating movements. No pronator drift. Normal romberg. Normal gait. No pain along temporal artery     Assessment and Plan   #Left-sided facial pain/headache S: started in mid January. Patient presented with left-sided facial pain from between his left nose to left ear that could be rather severe. Was severe enough to wake him up from sleep. Had not had much sinus pressure or  dental pain. 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 We tried gabapentin before bed to see if this would be beneficial.   At last visit woke him up 4 nights within a week, in last week about 3x woke him up- pain has improved some. He is not confident that gabapentin is helping much- had interaction with carbamazepine and oxcarbamazepine. Not having many episodes in daytime- perhaps mild discomfort in day at times such as at this moment  (no pain over temporal artery noted). Without tylenol pain lasts 25 minutes perhaps 20 minutes with tylenol.  Pain has somewhat shifted- still in front of left ear but now radiates up beside the eye more to the left forehead.  No blurry vision. He holds the side of his face when occurs- its a sudden severe pain.   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 History somewhat challenging with mild cognitive impairment. Pain was from in front of ear to nose now more along left front of ear up to forehead- I do not strongly suspect temporal arteritis but will check labs esr, crp. 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 Future Appointments  Date Time Provider [place]  01/18/2021  1:00 PM Hazle Coca, PhD [facility] None  01/18/2021  2:00 PM [facility] NEUROPSYCH TECH [facility] None  01/25/2021  2:30 PM Hazle Coca, PhD [facility] None  02/27/2021  1:30 PM [facility] CCM PHARMACIST [facility]  04/09/2021 10:40 AM Marin Olp, MD [facility]  05/09/2021 10:00 AM Cameron Sprang, MD [facility] None  11/16/2021  8:45 AM [facility] HEALTH COACH [facility]   Lab/Order associations:   ICD-10-CM   1. Facial pain  R51.9 CBC with Differential/Platelet    Comprehensive metabolic panel    Sedimentation rate    C-reactive protein    Ambulatory referral to Neurology    CANCELED: Ambulatory referral to Neurology  2. Left-sided headache  R51.9 CBC with Differential/Platelet    Comprehensive metabolic panel    Sedimentation rate    C-reactive protein    Ambulatory referral to Neurology     CANCELED: Ambulatory referral to Neurology    Meds ordered this encounter  Medications   gabapentin (NEURONTIN) 100 MG capsule    Sig: Take 2 capsules (200 mg total) by mouth 3 (three) times daily.    Dispense:  180 capsule    Refill:  2     Time Spent: 32 minutes of total time (1:35 PM- 2:07 PM) was spent on the date of the encounter performing the following actions: chart review prior to seeing the patient, obtaining history, performing a medically necessary exam, counseling on the treatment plan, placing orders, and documenting in our EHR.   Return precautions advised.  [name], MD

## 2020-12-07 ENCOUNTER — Ambulatory Visit (INDEPENDENT_AMBULATORY_CARE_PROVIDER_SITE_OTHER): Payer: BC Managed Care – PPO | Admitting: Family Medicine

## 2020-12-07 ENCOUNTER — Telehealth: Payer: Self-pay

## 2020-12-07 ENCOUNTER — Encounter: Payer: Self-pay | Admitting: Family Medicine

## 2020-12-07 ENCOUNTER — Other Ambulatory Visit: Payer: Self-pay

## 2020-12-07 VITALS — BP 140/84 | HR 76 | Temp 97.0°F | Ht 76.0 in | Wt 230.0 lb

## 2020-12-07 DIAGNOSIS — R519 Headache, unspecified: Secondary | ICD-10-CM | POA: Diagnosis not present

## 2020-12-07 LAB — CBC WITH DIFFERENTIAL/PLATELET
Basophils Absolute: 0 10*3/uL (ref 0.0–0.1)
Basophils Relative: 0.5 % (ref 0.0–3.0)
Eosinophils Absolute: 0.1 10*3/uL (ref 0.0–0.7)
Eosinophils Relative: 1.8 % (ref 0.0–5.0)
HCT: 45.7 % (ref 39.0–52.0)
Hemoglobin: 15.5 g/dL (ref 13.0–17.0)
Lymphocytes Relative: 26.9 % (ref 12.0–46.0)
Lymphs Abs: 1.8 10*3/uL (ref 0.7–4.0)
MCHC: 33.9 g/dL (ref 30.0–36.0)
MCV: 96.6 fl (ref 78.0–100.0)
Monocytes Absolute: 0.8 10*3/uL (ref 0.1–1.0)
Monocytes Relative: 12.2 % — ABNORMAL HIGH (ref 3.0–12.0)
Neutro Abs: 3.8 10*3/uL (ref 1.4–7.7)
Neutrophils Relative %: 58.6 % (ref 43.0–77.0)
Platelets: 198 10*3/uL (ref 150.0–400.0)
RBC: 4.73 Mil/uL (ref 4.22–5.81)
RDW: 13.4 % (ref 11.5–15.5)
WBC: 6.6 10*3/uL (ref 4.0–10.5)

## 2020-12-07 LAB — COMPREHENSIVE METABOLIC PANEL
ALT: 17 U/L (ref 0–53)
AST: 15 U/L (ref 0–37)
Albumin: 3.9 g/dL (ref 3.5–5.2)
Alkaline Phosphatase: 60 U/L (ref 39–117)
BUN: 16 mg/dL (ref 6–23)
CO2: 32 mEq/L (ref 19–32)
Calcium: 9.6 mg/dL (ref 8.4–10.5)
Chloride: 106 mEq/L (ref 96–112)
Creatinine, Ser: 1.04 mg/dL (ref 0.40–1.50)
GFR: 73.56 mL/min (ref >60.00)
Glucose, Bld: 108 mg/dL — ABNORMAL HIGH (ref 70–99)
Potassium: 4.1 mEq/L (ref 3.5–5.1)
Sodium: 143 mEq/L (ref 135–145)
Total Bilirubin: 0.2 mg/dL (ref 0.2–1.2)
Total Protein: 6.5 g/dL (ref 6.0–8.3)

## 2020-12-07 LAB — C-REACTIVE PROTEIN: CRP: <1 mg/dL (ref 0.5–20.0)

## 2020-12-07 LAB — SEDIMENTATION RATE: Sed Rate: 13 mm/hr (ref 0–20)

## 2020-12-07 MED ORDER — GABAPENTIN 100 MG PO CAPS: 200.0000 mg | ORAL_CAPSULE | Freq: Three times a day (TID) | ORAL | 2 refills | Status: DC

## 2020-12-07 NOTE — Chronic Care Management (AMB) (Signed)
    Chronic Care Management Pharmacy Assistant   Name: Rodney Scott  MRN: [id number] DOB: 02-29-1951  Reason for Encounter: Patient Assistance Documentation  PCP : Rodney Olp, MD  Allergies:   Allergies  Allergen Reactions  . Yellow Jacket Venom [Bee Venom] Shortness Of Breath    Had swelling localized, chest tightness, and shortness of breath    Medications: Outpatient Encounter Medications as of 12/07/2020  Medication Sig  . benazepril (LOTENSIN) 20 MG tablet Take 1 tablet by mouth once daily  . desvenlafaxine (PRISTIQ) 50 MG 24 hr tablet Take 1 tablet by mouth once daily  . donepezil (ARICEPT) 10 MG tablet Take 1 tablet (10 mg total) by mouth at bedtime.  . gabapentin (NEURONTIN) 100 MG capsule Take 1 capsule (100 mg total) by mouth 2 (two) times daily as needed.  . Loratadine 10 MG CAPS Take by mouth daily.  . Multiple Vitamin (MULTIVITAMIN) capsule Take by mouth.  . rosuvastatin (CRESTOR) 40 MG tablet Take 1 tablet by mouth once daily  . XARELTO 20 MG TABS tablet Take 1 tablet by mouth once daily   No facility-administered encounter medications on file as of 12/07/2020.    Current Diagnosis: Patient Active Problem List   Diagnosis Date Noted  . Senile purpura ([place]) 09/22/2020  . BPH associated with nocturia 03/10/2018  . Hyperglycemia 12/05/2016  . Left inguinal hernia 12/05/2016  . MCI (mild cognitive impairment) 06/15/2015  . Essential hypertension, benign 07/26/2014  . Current smoker 07/26/2014  . Depression, major, recurrent, in complete remission ([place]) 03/24/2009  . 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 Osteoarthritis 07/15/2008  . Venous (peripheral) insufficiency 12/11/2007  . Hyperlipidemia 07/31/2007  . Factor V Leiden ([place]) 07/31/2007  . Allergic rhinitis 07/31/2007  . DVT, HX OF 07/31/2007  . History of colonic polyps 07/31/2007    I called Rodney Scott and Rodney Scott Patient Assistance Program to inquire on application for Xarelto. I was  informed this application was not sent to this program. I called and asked the patient, he states he does not wish to proceed with this application nor does he wish to proceed to apply for assistance for medication [name].  Rodney Scott, [place] Pharmacist Assistant [phone]   Follow-Up:  Patient Assistance Coordination and Pharmacist Review

## 2020-12-28 ENCOUNTER — Other Ambulatory Visit: Payer: Self-pay

## 2020-12-28 ENCOUNTER — Ambulatory Visit (INDEPENDENT_AMBULATORY_CARE_PROVIDER_SITE_OTHER): Payer: BC Managed Care – PPO | Admitting: Neurology

## 2020-12-28 ENCOUNTER — Encounter: Payer: Self-pay | Admitting: Neurology

## 2020-12-28 VITALS — BP 118/77 | HR 76 | Resp 18 | Ht 76.0 in | Wt 231.0 lb

## 2020-12-28 DIAGNOSIS — G3184 Mild cognitive impairment, so stated: Secondary | ICD-10-CM

## 2020-12-28 DIAGNOSIS — G500 Trigeminal neuralgia: Secondary | ICD-10-CM

## 2020-12-28 MED ORDER — DONEPEZIL HCL 10 MG PO TABS: 10.0000 mg | ORAL_TABLET | Freq: Every day | ORAL | 3 refills | Status: DC

## 2020-12-28 NOTE — Progress Notes (Signed)
NEUROLOGY FOLLOW UP OFFICE NOTE  DELWYN Scott [id number] 1951/02/08  HISTORY OF PRESENT ILLNESS: I had the pleasure of seeing Rodney Scott in follow-up in the neurology clinic on 12/28/2020.  The patient was last seen 4 months ago for Mild Cognitive Impairment. He presents today for an earlier visit for different symptoms of left facial pain. He is again accompanied by his wife who helps supplement the history today.  Records and images were personally reviewed where available. He started reporting left facial pain between his nostril and left ear/cheek in January 2022.   He was started on gabapentin which initially did not seem to help much, however with increase in dose to 200mg  TID, pain is improved. He mostly notices a dull ache sometimes. Sometimes the pain wakes him up in the middle of the night, he mostly notices the pain when he is at home sitting or laying down. If he does not take the gabapentin, sharp pain recurs. There are no clear triggers, he denies any tenderness to palpation, no sensitivity to light touch. He denies any jaw pain/pain when chewing. No vision changes. He denies any side effects on gabapentin, his wife feels he is a little drowsy but he continues to work without issues. His wife denies any bruxism but states he moves his mouth sometimes and he feels like he is eating rice crispies. Memory overall stable, his wife has noticed more "jumps" in memory loss, he would worsen then stabilize, then have another big jump and stabilize. He is scheduled for repeat Neurocognitive testing next month. He is on Donepezil 10mg  daily without side effects. He occasionally missed the mid-day dose of Gabapentin. He denies getting lost driving. Sleep is good.   I personally reviewed MRI face/trigeminal with and without contrast done 11/30/2020 which did not show any compressive lesion/vascular abnormality on the left. There were degenerative changes in the temporomandibular joints and cervical  spine.  Lab Results  Component Value Date   ESRSEDRATE 13 12/07/2020     History on Initial Assessment 09/25/2018: This is a 70 year old right-handed man with a history of hypertension, hyperlipidemia, Factor V Leiden deficiency, DVT on Xarelto, presenting for evaluation of worsening memory. He and his wife started noticing changes over the past year. He states long-term memory is much better than remembering what he did yesterday. He misplaces things frequently at home, they have lived in the same house for 30 years and his wife has to search for things that have always been in the same place for years. He denies getting lost driving but uses his GPS a lot, he cannot remember the way to places like before. He procrastinates on bills but generally does not miss bills, his wife nods that he has. He occasionally forgets his medications. 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 His wife's major concern is how this has affected caring for their 75 year old granddaughter who has been living with them for the past 5 years. He forgets to feed her, which is new. He goes to a store and forgets what he is doing, she has to give him a pretty detailed note so he can remember events for the day. She has noticed his language skills have decreased markedly, he is not a very verbal person, but she has noticed more difficulty expressing his thoughts or finding words. He may have a thought but stumbles over the words. He repeats himself, one time last summer he told the  same story 3-4 times within half an hour. He has always been easily frustrated, but she feels it is worse, such as when driving, "no one knows what they are doing." No paranoia or hallucinations.   He denies any headaches, dizziness, vision changes, neck/back pain, focal numbness/tingling/weakness, bowel/bladder dysfunction, or tremors. He has lost his sense of smell over the past year for no clear reason. He  denies any head injuries. His mother had dementia in her 50s. 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 When asked about mood, he states "I don't know." His wife does not think he is depressed, she is unsure why he is still taking the Wellbutrin and Pristiq, she feels Wellbutrin was started 20 years ago for smoking cessation.   PAST MEDICAL HISTORY: Past Medical History:  Diagnosis Date  . Allergic rhinitis   . Allergy   . Clotting disorder ([place])   . DVT (deep venous thrombosis) ([place])   . Factor V deficiency ([place])   . Hyperlipidemia   . Hypertension   . Internal hemorrhoid 11/24/2009   No bleeding. Patient states thought was external.     . Osteoarthritis   . Right rotator cuff tear 12/07/2010   S/p surgery.    . Tubular adenoma of colon 11/2006    MEDICATIONS: Current Outpatient Medications on File Prior to Visit  Medication Sig Dispense Refill  . benazepril (LOTENSIN) 20 MG tablet Take 1 tablet by mouth once daily 90 tablet 0  . desvenlafaxine (PRISTIQ) 50 MG 24 hr tablet Take 1 tablet by mouth once daily 90 tablet 0  . donepezil (ARICEPT) 10 MG tablet Take 1 tablet (10 mg total) by mouth at bedtime. 90 tablet 3  . gabapentin (NEURONTIN) 100 MG capsule Take 2 capsules (200 mg total) by mouth 3 (three) times daily. 180 capsule 2  . Loratadine 10 MG CAPS Take by mouth daily.    . Multiple Vitamin (MULTIVITAMIN) capsule Take by mouth.    . rosuvastatin (CRESTOR) 40 MG tablet Take 1 tablet by mouth once daily 90 tablet 0  . XARELTO 20 MG TABS tablet Take 1 tablet by mouth once daily 90 tablet 0   No current facility-administered medications on file prior to visit.    ALLERGIES: Allergies  Allergen Reactions  . Yellow Jacket Venom [Bee Venom] Shortness Of Breath    Had swelling localized, chest tightness, and shortness of  breath    FAMILY HISTORY: Family History  Problem Relation Age of Onset  . Colon cancer Father 50  . Factor V Leiden deficiency Son   . Esophageal cancer Neg Hx   . Rectal cancer Neg Hx   . Stomach cancer Neg Hx     SOCIAL HISTORY: Social History   Socioeconomic History  . Marital status: Married    Spouse name: Not on file  . Number of children: 2  . Years of education: Not on file  . Highest education level: Some college, no degree  Occupational History  . Not on file  Tobacco Use  . Smoking status: Current Every Day Smoker    Packs/day: 0.25    Years: 45.00    Pack years: 11.25    Types: Cigarettes  . Smokeless tobacco: Never Used  Vaping Use  . Vaping Use: Never used  Substance and Sexual Activity  .  Alcohol use: Not Currently    Alcohol/week: 14.0 standard drinks    Types: 14 Standard drinks or equivalent per week  . Drug use: No  . Sexual activity: Yes  Other Topics Concern  . Not on file  Social History Narrative   Married (wife patient of Dr. Yong Channel). Son and daughter. 1 granddaughter.       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 Difficulty of Paying Living Expenses: Not hard at all  Food Insecurity: No Food Insecurity  . Worried About Charity fundraiser in the Last Year: Never true  . Ran Out of Food in the Last Year: Never true  Transportation Needs: No Transportation Needs  . Lack of Transportation (Medical): No  . Lack of Transportation (Non-Medical): No  Physical Activity: Inactive  . Days of Exercise per Week: 0 days  . Minutes of Exercise per Session: 0 min  Stress: No Stress Concern Present  .  Feeling of Stress : Not at all  Social Connections: Moderately Isolated  . Frequency of Communication with Friends and Family: Twice a week  . Frequency of Social Gatherings with Friends and Family: Once a week  . Attends Religious Services: Never  . Active Member of Clubs or Organizations: No  . Attends Archivist Meetings: Never  . Marital Status: Married  Human resources officer Violence: Not At Risk  . Fear of Current or Ex-Partner: No  . Emotionally Abused: No  . Physically Abused: No  . Sexually Abused: No     PHYSICAL EXAM: Vitals:   12/28/20 1152  BP: 118/77  Pulse: 76  Resp: 18  SpO2: 96%   General: No acute distress Head:  Normocephalic/atraumatic Skin/Extremities: No rash, no edema Neurological Exam: alert and awake. Fund of knowledge is appropriate. Attention and concentration are normal.   Cranial nerves: Pupils equal, round. Extraocular movements intact with no nystagmus. Visual fields full. Facial sensation intact. Shallow left nasolabial fold, symmetric smile. Motor: Bulk and tone normal, muscle strength 5/5 throughout with no pronator drift.   Finger to nose testing intact.  Gait narrow-based and steady, able to tandem walk adequately.  Romberg negative.   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 He is scheduled for repeat Neuropsychological evaluation next month. Continue Donepezil 10mg  daily. He presents for new symptoms suggestive of trigeminal neuralgia on the left. MRI did not show any compressive lesion. We discussed symptomatic treatment, he has had improvement with gabapentin 200mg  TID, continue current dose. May uptitrate as tolerated. Follow-up as scheduled in July, they know to call for  any changes.  Thank you for allowing me to participate in his care.  Please do not hesitate to call for any questions or concerns.   Rodney Scott, M.D.   CC: Dr. Yong Channel

## 2020-12-28 NOTE — Patient Instructions (Signed)
Good to see you!  1. Continue Gabapentin 200mg  three times a day. Use an alarm to remember mid-day dose.  2. Continue all your other medications  3. Proceed with Neurocognitive testing scheduled in April, follow-up with me in July, call for any changes

## 2021-01-03 ENCOUNTER — Other Ambulatory Visit: Payer: Self-pay

## 2021-01-03 MED ORDER — DESVENLAFAXINE SUCCINATE ER 50 MG PO TB24: 50.0000 mg | ORAL_TABLET | Freq: Every day | ORAL | 2 refills | Status: DC

## 2021-01-03 MED ORDER — RIVAROXABAN 20 MG PO TABS: 20.0000 mg | ORAL_TABLET | Freq: Every day | ORAL | 0 refills | Status: DC

## 2021-01-18 ENCOUNTER — Encounter: Payer: Self-pay | Admitting: Psychology

## 2021-01-18 ENCOUNTER — Ambulatory Visit (INDEPENDENT_AMBULATORY_CARE_PROVIDER_SITE_OTHER): Payer: BC Managed Care – PPO | Admitting: Psychology

## 2021-01-18 ENCOUNTER — Other Ambulatory Visit: Payer: Self-pay

## 2021-01-18 ENCOUNTER — Ambulatory Visit: Payer: BC Managed Care – PPO | Admitting: Psychology

## 2021-01-18 DIAGNOSIS — G3184 Mild cognitive impairment, so stated: Secondary | ICD-10-CM | POA: Diagnosis not present

## 2021-01-18 DIAGNOSIS — R4189 Other symptoms and signs involving cognitive functions and awareness: Secondary | ICD-10-CM

## 2021-01-18 DIAGNOSIS — F3342 Major depressive disorder, recurrent, in full remission: Secondary | ICD-10-CM

## 2021-01-18 DIAGNOSIS — D6851 Activated protein C resistance: Secondary | ICD-10-CM

## 2021-01-18 NOTE — Progress Notes (Signed)
   Psychometrician Note   Cognitive testing was administered to Rodney Scott by Milana Kidney, B.S. (psychometrist) under the supervision of Dr. Christia Reading, Ph.D., licensed psychologist on 01/18/21. Rodney Scott did not appear overtly distressed by the testing session per behavioral observation or responses across self-report questionnaires. Rest breaks were offered.    The battery of tests administered was selected by Dr. Christia Reading, Ph.D. with consideration to Rodney Scott's current level of functioning, the nature of his symptoms, emotional and behavioral responses during interview, level of literacy, observed level of motivation/effort, and the nature of the referral question. This battery was communicated to the psychometrist. Communication between Dr. Christia Reading, Ph.D. and the psychometrist was ongoing throughout the evaluation and Dr. Christia Reading, Ph.D. was immediately accessible at all times. Dr. Christia Reading, Ph.D. provided supervision to the psychometrist on the date of this service to the extent necessary to assure the quality of all services provided.    Rodney Scott will return within approximately 1-2 weeks for an interactive feedback session with Dr. Melvyn Novas at which time his test performances, clinical impressions, and treatment recommendations will be reviewed in detail. Rodney Scott understands he can contact our office should he require our assistance before this time.  A total of 140 minutes of billable time were spent face-to-face with Rodney Scott by the psychometrist. This includes both test administration and scoring time. Billing for these services is reflected in the clinical report generated by Dr. Christia Reading, Ph.D.  This note reflects time spent with the psychometrician and does not include test scores or any clinical interpretations made by Dr. Melvyn Novas. The full report will follow in a separate note.

## 2021-01-18 NOTE — Progress Notes (Signed)
NEUROPSYCHOLOGICAL EVALUATION [place]  Date of Evaluation: January 18, 2021  Reason for Referral:   Rodney Scott is a 70 y.o. right-handed Caucasian male referred by [name], M.D., to characterize his current cognitive functioning and assist with diagnostic clarity and treatment planning in the context of prior diagnosis of mild cognitive impairment and concerns for worsening cognitive decline.   Assessment and Plan:   Clinical Impression(s): [name] pattern of performance is suggestive of a primary impairment surrounding all aspects of learning and memory. Writing samples also exhibited impairment with evidence for agrammatism (e.g., "Mom mad" or "Grill hot"). However, all other receptive and expressive language assessments were appropriate. A relative weakness was also exhibited across a computerized task assessing hypothesis testing and adaptability; however, performances across other tasks assessing executive functions were appropriate. Performance was also appropriate across processing speed, attention/concentration, and visuospatial abilities. [name] denied difficulties completing instrumental activities of daily living (ADLs) independently and continues to work without significant trouble. 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  Relative to his previous evaluation, memory performances were generally stable as primary impairments in verbal and visual memory were also seen across his March 2021 evaluation. Very mild declines were seen across discriminability of a previously learned list of words, as well as delayed recall of aspects of daily living (e.g., medication instructions; name, address, and phone number). An additional performance decline was exhibited  across the computerized card sorting task described above. Language assessments were generally stable (writing samples were not assessed during previous evaluations); however, improvements were seen across semantic fluency and confrontation naming tasks. Mild improvements were also seen across attention/concentration. Processing speed and visuospatial abilities were stable.   Overall, the etiology of cognitive dysfunction continues to be unclear at the present time. I agree with Dr. Nicole Kindred surrounding concerns of a degenerative process affecting memory processes. [name] was variably amnestic across verbal memory measures and typically performed poorly across yes/no recognition trials. Memory storage deficits do raise concerns for Alzheimer's disease given that this is the hallmark characteristic of this condition. However, it is important to highlight that other areas which can be implicated early on in Alzheimer's disease (i.e., semantic fluency and confrontation naming) actually saw decent improvements across the current evaluation rather than evidence of decline, which would be inconsistent with the typical trajectory of this condition. While I cannot rule out a primary progressive aphasia (PPA) presentation, I do not believe that current test results strongly suggest this presentation at the present time. Despite his writing samples being quite poor, all other receptive and expressive language performances were strong and have remained stable over the course of three neuropsychological evaluations. A PPA presentation is characterized by prominent and progressive language dysfunction, which does not align with reports during interview outside of his use of generic words as fillers in some sentences (e.g., "you know, you know..."). Ultimately, as these diseases can progress at variable rates, additional time is likely necessary before firm diagnostic conclusions can be made. I do not see compelling  evidence for Lewy body dementia or the behavioral variant of frontotemporal dementia presently. Continued medical monitoring will be important moving forward.  Recommendations: A repeat neuropsychological evaluation in 18-24 months (or sooner if functional decline is noted) is recommended to assess the trajectory of future cognitive decline should it occur. This  will also aid in future efforts towards improved diagnostic clarity.  [name] has already been prescribed medication commonly used to treat memory loss and concerns for a degenerative illness (donepezil/Aricept). He is encouraged to continue taking this medication as prescribed. It is important to highlight that if something like Alzheimer's disease is indeed present, no current treatment is able to stop or reverse cognitive decline. Medications are able to slow functional decline in some individuals.   Should there be a progression of his current deficits over time, [name] is unlikely to regain any independent living skills lost. Therefore, it is recommended that he remain as involved as possible in all aspects of household chores, finances, and medication management, with supervision to ensure adequate performance. He will likely benefit from the establishment and maintenance of a routine in order to maximize his functional abilities over time.  It will be important for [name] to have another person with him when in situations where he may need to process information, weigh the pros and cons of different options, and make decisions, in order to ensure that he fully understands and recalls all information to be considered.  If not already done, [name] and his family may want to discuss his wishes regarding durable power of attorney and medical decision making, so that he can have input into these choices. Additionally, they may wish to discuss future plans for caretaking and seek out community options for in  home/residential care should they become necessary.  [name] is encouraged to attend to lifestyle factors for brain health (e.g., regular physical exercise, good nutrition habits, regular participation in cognitively-stimulating activities, and general stress management techniques), which are likely to have benefits for both emotional adjustment and cognition. In fact, in addition to promoting good general health, regular exercise incorporating aerobic activities (e.g., brisk walking, jogging, cycling, etc.) has been demonstrated to be a very effective treatment for depression and stress, with similar efficacy rates to both antidepressant medication and psychotherapy. Optimal control of vascular risk factors (including safe cardiovascular exercise and adherence to dietary recommendations) is encouraged.   When learning new information, he would benefit from information being broken up into small, manageable pieces. He may also find it helpful to articulate the material in his own words and in a context to promote encoding at the onset of a new task. This material may need to be repeated multiple times to promote encoding.  Important information to remember should also be provided in written format in all instances. This should be organized and placed in a highly visible and commonly frequented area of his residence to help promote recall.   To address problems with fluctuating attention and mild executive dysfunction, he may wish to consider:   -Avoiding external distractions when needing to concentrate   -Limiting exposure to fast paced environments with multiple sensory demands   -Writing down complicated information and using checklists   -Attempting and completing one task at a time (i.e., no multi-tasking)   -Verbalizing aloud each step of a task to maintain focus   -Reducing the amount of information considered at one time  Review of Records:   Mr. Liddy completed a comprehensive  neuropsychological evaluation Howard Pouch, Ph.D.) on 10/26/2018. Results suggested select executive dysfunction mildly affecting memory performances. His mildly frontal-subcortical pattern was said to potentially relate to various cerebrovascular risk factors (hypertension, hyperlipidemia, historical DVT). Deficits were also said to potentially be influenced by alcohol abuse as Dr. Darol Destine reported a remote history of  Mr. Albrecht consuming 3-4 beers per day, stopping sometime in 2019. Continued monitoring was recommended, along with repeat testing in the future.   Mr. Feldhaus completed a comprehensive neuropsychological evaluation Alphonzo Severance, Psy.D.) on 01/03/2020. Results suggested changes with respect to memory affecting learning and retention of both verbal and visual information. This pattern was said to be similar to difficulties that Mr. Galer demonstrated during a prior evaluation in 2020, albeit with possibly greater visual memory difficulties. He did retain some information across time and thus was not demonstrating a complete memory storage issue. Diminished processing speed was also noticed. With respect to language, he did not have any abnormal test scores despite extended testing, although his performance was slightly lower from a raw score perspective on animal fluency, which may be significant given that one would expect practice effects. He performed well on all measures of executive function and screened negative for the presence of depression. Overall, Dr. Nicole Kindred found Mr. Stokely's qualitative presentation concerning for a developing primary progressive aphasia condition although at that time, he was not demonstrating any frank language impairments. He was ultimately diagnosed with a mild cognitive impairment. Repeat testing was recommended.   Mr. Ruben was seen by [facility] [name], M.D.) on 12/28/2020 for follow-up. He presented for an earlier appointment  due to ongoing symptoms of left facial pain. He started noticing left facial pain between his nostril and left ear/cheek in January 2022. He was started on gabapentin which initially did not seem to help much; however, with an increase in dose to 200mg  TID, his pain improved. He reported observing a dull ache which can sometimes wake him up in the middle of the night. However, symptoms are mostly noticed when he is at home sitting or laying down. There were said to be no clear triggers and he denied any tenderness to palpation or sensitivity to light touch. He also denied jaw pain when chewing. His wife denied any bruxism but stated that he moves his mouth sometimes and feels like he is eating Rice Crispies. Memory was said to be overall stable. However, his wife did report more "jumps" in memory loss where he would seemingly worsen and then stabilize. He is on donepezil 10mg  daily without side effects. ADLs were described as generally intact and no sleep/mood concerns were noted. Ultimately, Mr. Zellars was referred for a repeat neuropsychological evaluation to characterize his cognitive abilities and to assist with diagnostic clarity and treatment planning.   Brain MRI on 10/19/2018 revealed mild chronic microvascular ischemic changes. Cerebral volume was said to be normal. Trigeminal face MRI on 11/30/2020 noted some degenerative changes of the temporomandibular joints and cervical spine.   Past Medical History:  Diagnosis Date  . Allergic rhinitis 07/31/2007   Loratadine at times- but mild mental fog on this   . BPH associated with nocturia 03/10/2018  . Clotting disorder   . Essential hypertension, benign 07/26/2014   Benazepril 20mg    . Factor V Leiden 07/31/2007   Xarelto 20mg  daily. 2 DVT 2001, PE 2011.    Marland Kitchen History of colonic polyps 07/31/2007   02/2015 adenoma. 2019- adenoma again    . History of venous thrombosis and embolism 07/31/2007  . Hyperglycemia 12/05/2016   Very mild- low 100s  .  Hyperlipidemia   . Internal hemorrhoid 11/24/2009   No bleeding. Patient states thought was external.     . Left inguinal hernia 12/05/2016   Declines surgery referral- minimally bothering him. Can call in if he changes his  mind  . Major depressive disorder in full remission 03/24/2009   Wellbutrin xl 300mg -->150mg --> off 03/2020  pristiq 50mg .   In past-->remeron 15mg  mainly takes prn for sleep zoloft --> pristiq per sister who is psychiatrist apparetnly  . MCI (mild cognitive impairment) 06/15/2015  . Osteoarthritis   . Right rotator cuff tear 12/07/2010   S/p surgery.    . Senile purpura 09/22/2020  . 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 Tubular adenoma of colon 11/2006  . Venous (peripheral) insufficiency 12/11/2007   Trace to 1+. COmpression stocking on left. L >R swelling-history of 1st DVT in L.       Past Surgical History:  Procedure Laterality Date  . right rotator cuff repair    . SALIVARY STONE REMOVAL      Current Outpatient Medications:  .  benazepril (LOTENSIN) 20 MG tablet, Take 1 tablet by mouth once daily, Disp: 90 tablet, Rfl: 0 .  desvenlafaxine (PRISTIQ) 50 MG 24 hr tablet, Take 1 tablet (50 mg total) by mouth daily., Disp: 90 tablet, Rfl: 2 .  donepezil (ARICEPT) 10 MG tablet, Take 1 tablet (10 mg total) by mouth at bedtime., Disp: 90 tablet, Rfl: 3 .  gabapentin (NEURONTIN) 100 MG capsule, Take 2 capsules (200 mg total) by mouth 3 (three) times daily., Disp: 180 capsule, Rfl: 2 .  Loratadine 10 MG CAPS, Take by mouth daily., Disp: , Rfl:  .  Multiple Vitamin (MULTIVITAMIN) capsule, Take by mouth., Disp: , Rfl:  .  rivaroxaban (XARELTO) 20 MG TABS tablet, Take 1 tablet (20 mg total) by mouth daily., Disp: 90 tablet, Rfl: 0 .  rosuvastatin (CRESTOR) 40 MG tablet, Take 1 tablet by mouth once daily, Disp: 90 tablet, Rfl: 0  Clinical Interview:   The following information was obtained during a clinical interview with Mr. Eslick and his wife prior to  cognitive testing.  Cognitive Symptoms: Decreased short-term memory: Endorsed. He described primary difficulties surrounding him recalling the details of previous conversations, as well as the names of familiar individuals. His wife added that he has had a propensity to misplace objects (including putting items in the wrong location when emptying the dishwasher). She also noted that he has forgotten to turn off the stove a few times over recent months when making breakfast. Memory dysfunction was said to have been present since prior to his early 2020 evaluation. It was said to have gradually worsened over time. However, his wife specified that she has observed "chunks" of memory loss where Mr. Charette appears to exhibit more pronounced declines followed by some stability.  Decreased long-term memory: Denied. Decreased attention/concentration: Denied. Reduced processing speed: Endorsed. Difficulties with executive functions: Endorsed. He reported a longstanding weakness with mild disorganization which has been stable over time. His wife noted that he seems more indecisive and has trouble with planning and remembering upcoming appointments. Impulsivity was denied. His wife also reported some personality changes in that Mr. Bauer appears more quiet and disengaged, especially around family. This has been ongoing for the past year or so.  Difficulties with emotion regulation: Denied. Difficulties with receptive language: Denied. Difficulties with word finding: Endorsed. His wife noted that he will use filler words (i.e., "you know, you know") often while speaking due to experiencing word finding deficits. This was said to have mildly worsened during the past year.  Decreased visuoperceptual ability: Denied.  Difficulties completing ADLs: Largely denied. He manages his medications without noted difficulty. His wife described a few instances where he seemingly forgot about bills  which were due and needed  to be paid. They have been working on a method of paying bills immediately rather than putting them aside until the due date gets closer. His wife did add that she will manage any finances which are addressed to her. Ongoing driving related difficulties were denied.   Additional Medical History: History of traumatic brain injury/concussion: Denied. History of stroke: Denied. History of seizure activity: Denied. History of known exposure to toxins: Denied. Symptoms of chronic pain: Denied. Experience of frequent headaches/migraines: Denied. He acknowledged a remote history of headaches but stated that these have become very infrequent experiences since starting gabapentin.  Frequent instances of dizziness/vertigo: Denied.  Sensory changes: He has a history of hearing loss and will utilize hearing aids, especially for higher frequencies. He also reported a longstanding diminished sense of both taste and smell. The cause for this was unknown. No vision-related difficulties were reported.  Balance/coordination difficulties: Denied. He also denied any recent falls.  Other motor difficulties: Denied.  Sleep History: Estimated hours obtained each night: 7-8 hours.  Difficulties falling asleep: Denied. Difficulties staying asleep: Denied outside of occasionally waking to use the restroom.  Feels rested and refreshed upon awakening: Endorsed "for the most part."   History of snoring: Unclear.  History of waking up gasping for air: Denied. Witnessed breath cessation while asleep: Denied.  History of vivid dreaming: Denied. Excessive movement while asleep: Denied. Instances of acting out his dreams: Denied.  Psychiatric/Behavioral Health History: Depression: He initially denied a history of depression. His wife reminded him that he takes a low dose anti-depressant medication and has done so for many years. She noted being unclear if this medication was necessary at the present time. Mr. Griffie  described his current mood as "all right" and denied acute depressive symptoms. Current or remote suicidal ideation, intent, or plan was denied.  Anxiety: Denied. Mania: Denied. Trauma History: Denied. Visual/auditory hallucinations: Denied. Delusional thoughts: Denied.  Tobacco: Endorsed. He reported consuming around one "short" pack of cigarettes per day.  Alcohol: He reported an occasional beer in the evenings but generally infrequent alcohol consumption. Medical records do suggest a history of potential alcohol abuse where he was consuming 3-4 beers per night. Alcohol use was reportedly diminished in 2019.  Recreational drugs: Denied. Caffeine: He reported consuming 4-5 cups of coffee throughout the day.   Family History: Problem Relation Age of Onset  . Memory loss Mother   . Colon cancer Father 29  . Factor V Leiden deficiency Son   . Esophageal cancer Neg Hx   . Rectal cancer Neg Hx   . Stomach cancer Neg Hx    This information was confirmed by Mr. White.  Academic/Vocational History: Highest level of educational attainment: 12 years. He graduated high school and described himself as an average (C) student in academic settings. However, performances were somewhat subject dependant. For example, social studies represented a strength while math was a relative weakness. He reported engaging in summer school courses in middle school due to academic performance but could not recall what subject(s) this was for.  History of developmental delay: Denied. History of grade repetition: Denied. Enrollment in special education courses: Denied. History of LD/ADHD: Denied.  Employment: He currently works in Theatre manager for [employer]. He denied cognitive dysfunction impacting current work Systems analyst. His wife did note some trouble understanding instructions and navigating to and from job sites. 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   Evaluation Results:   Behavioral Observations: Mr. Sprinkle was accompanied by his wife, arrived to his appointment on time, and was appropriately dressed and groomed. He appeared alert and oriented. Observed gait and station were within normal limits. Gross motor functioning appeared intact upon informal observation and no abnormal movements (e.g., tremors) were noted. His affect was generally relaxed and positive, but did range appropriately given the subject being discussed during the clinical interview or the task at hand during testing procedures. Spontaneous speech was fluent and word finding difficulties were not observed during the clinical interview. Thought processes were coherent, organized, and normal in content. Insight into his cognitive difficulties was difficult to determine as Mr. Panagopoulos was either actively minimizing concerns or does have some limited insight into ongoing weaknesses, particularly surrounding memory. During testing, sustained attention was appropriate. Task engagement was adequate and he persisted when challenged. Overall, Mr. Cavenaugh was cooperative with the clinical interview and subsequent testing procedures.   Adequacy of Effort: The validity of neuropsychological testing is limited by the extent to which the individual being tested may be assumed to have exerted adequate effort during testing. Mr. Bromwell expressed his intention to perform to the best of his abilities and exhibited adequate task engagement and persistence. Scores across stand-alone and embedded performance validity measures were within expectation. As such, the results of the current evaluation are believed to be a valid representation of Mr. Wiltsie's current cognitive functioning.  Test Results: Mr. Nickerson was fully oriented at the time of the current evaluation.  Intellectual abilities based upon educational and vocational attainment were estimated  to be in the average range. Premorbid abilities were estimated to be within the average range based upon a single-word reading test.   Processing speed was below average to average. Basic attention was average. More complex attention (e.g., working memory) was also average. Executive functioning was below average to average except across a computerized task assessing hypothesis testing and adaptability. These performances were well below average to below average.  Assessed receptive language abilities were average. Likewise, Mr. Ahr did not exhibit any difficulties comprehending task instructions and answered all questions asked of him appropriately. Sentence repetition was average. Verbal fluency (i.e., phonemic and semantic) were average to above average. Content generation to a provided visual stimuli was average. Reading comprehension was average. Confrontation naming was average. Writing samples were exceptionally low with evidence for agrammatism. Provided sentences were accurate to the provided stimuli but did not reflect complete sentences (e.g., "Dog eating hot dog," "Mom mad," or "Grill hot").    Assessed visuospatial/visuoconstructional abilities were average.    Learning (i.e., encoding) of novel verbal and visual information was exceptionally low to below average. Spontaneous delayed recall (i.e., retrieval) of previously learned information was exceptionally low to well below average. Retention rates were 0% across a story learning task, 0% across a list learning task, and 150% (raw score of 3) across a shape learning task. However, the latter score was likely inflated by happenstance guessing. Performance across recognition tasks was exceptionally low to below average, suggesting some but minimal evidence for information consolidation.   Results of emotional screening instruments suggested that recent symptoms of generalized anxiety were in the minimal range, while symptoms of  depression were within normal limits. A screening instrument assessing recent sleep quality suggested the presence of minimal sleep dysfunction.  Tables of Scores:   Note: This summary of test scores accompanies the interpretive report and should not be considered in isolation  without reference to the appropriate sections in the text. Descriptors are based on appropriate normative data and may be adjusted based on clinical judgment. The terms "impaired" and "within normal limits (WNL)" are used when a more specific level of functioning cannot be determined. Descriptors refer to the current evaluation only.         Effort Testing:    DESCRIPTOR   March 2021 Current    ACS Word Choice: --- --- --- Below Expectation  Dot Counting Test: --- --- --- Within Expectation  NAB EVI: --- --- --- Within Expectation  D-KEFS Color Word Effort Index: --- --- --- Within Expectation        Orientation:       Raw Score Raw Score Percentile   NAB Orientation, Form 1 --- 29/29 --- ---        Cognitive Screening:             Raw Score Raw Score Percentile   SLUMS: --- 26/30 --- ---        Intellectual Functioning:             Standard Score Standard Score Percentile   Test of Premorbid Functioning: --- 99 47 Average        Memory:            NAB Memory Module, Form 1: T Score Standard Score/ T Score Percentile   Total Memory Index --- 65 1 Exceptionally Low  List Learning        Total Trials 1-3 35 15/36 (41) 18 Below Average    List B 46 4/12 (53) 62 Average    Short Delay Free Recall 24 0/12 (24) <1 Exceptionally Low    Long Delay Free Recall 30 0/12 (30) 2 Well Below Average    Retention Percentage --- 0 (15) <1 Exceptionally Low    Recognition Discriminability 34 %ile 3 (37) 9 Below Average  Shape Learning        Total Trials 1-3 --- 3/27 (19) <1 Exceptionally Low    Delayed Recall --- 3/9 (39) 14 Below Average    Retention Percentage --- 150 (67) 96 Well Above Average    Recognition  Discriminability --- 3 (32) 4 Well Below Average  Story Learning        Immediate Recall 37 36/80 (35) 7 Well Below Average    Delayed Recall 35 0/40 (33) 5 Well Below Average    Retention Percentage --- 0 (5) <1 Exceptionally Low  Daily Living Memory        Immediate Recall 35 33/51 (41) 18 Below Average    Delayed Recall 48 5/17 (26) 1 Exceptionally Low    Retention Percentage --- 33 (13) <1 Exceptionally Low    Recognition Hits 19 4/10 (12) <1 Exceptionally Low        Attention/Executive Function:            Trail Making Test (TMT): T Score Raw Score (T Score) Percentile     Part A 53 40 secs.,  0 errors (44) 27 Average    Part B 48 118 secs.,  1 error (44) 27 Average          Scaled Score Scaled Score Percentile   WAIS-IV Coding: --- 9 37 Average        NAB Attention Module, Form 1: T Score T Score Percentile     Digits Forward 41 54 66 Average    Digits Backwards 39 46 34 Average  D-KEFS Color-Word Interference Test: Raw Score Raw Score (Scaled Score) Percentile     Color Naming --- 39 secs. (7) 16 Below Average    Word Reading --- 28 secs. (8) 25 Average    Inhibition --- 86 secs. (7) 16 Below Average      Total Errors --- 1 error (11) 63 Average    Inhibition/Switching --- 101 secs. (7) 16 Below Average      Total Errors --- 6 errors (7) 16 Below Average        [employer] Test: Raw Score Raw Score Percentile     Categories (trials) --- 1 (64) 6-10 Well Below Average    Total Errors --- 32 9 Below Average    Perseverative Errors --- 17 18 Below Average    Non-Perseverative Errors --- 15 9 Below Average    Failure to Maintain Set --- 0 --- ---        Language:             Raw Score Raw Score Percentile   Sentence Repetition: --- 14/22 27 Average        Verbal Fluency Test: T Score Raw Score (T Score) Percentile     Phonemic Fluency (FAS) 48 35 (48) 42 Average    Animal Fluency 39 18 (60) 84 Above Average               NAB Language Module,  Form 1: T Score T Score Percentile     Oral Production 48 45 31 Average    Auditory Comprehension --- 58 79 Above Average    Naming 39 31/31 (57) 75 Above Average    Reading Comprehension --- 13/13 50 Average    Writing --- 24 <1 Exceptionally Low        Visuospatial/Visuoconstruction:       Raw Score Raw Score Percentile   Clock Drawing: --- 10/10 --- Within Normal Limits        NAB Spatial Module, Form 1: T Score T Score Percentile     Figure Drawing Copy --- 45 31 Average        Mood and Personality:       Raw Score Raw Score Percentile   Geriatric Depression Scale: --- 8 --- Within Normal Limits  Geriatric Anxiety Scale: --- 1 --- Minimal    Somatic --- 0 --- Minimal    Cognitive --- 0 --- Minimal    Affective --- 1 --- Minimal        Additional Questionnaires:       Raw Score Raw Score Percentile   PROMIS Sleep Disturbance Questionnaire: --- 13 --- None to Slight   Informed Consent and Coding/Compliance:   The current evaluation represents a clinical evaluation for the purposes previously outlined by the referral source and is in no way reflective of a forensic evaluation.   Mr. Hodapp was provided with a verbal description of the nature and purpose of the present neuropsychological evaluation. Also reviewed were the foreseeable risks and/or discomforts and benefits of the procedure, limits of confidentiality, and mandatory reporting requirements of this provider. The patient was given the opportunity to ask questions and receive answers about the evaluation. Oral consent to participate was provided by the patient.   This evaluation was conducted by Christia Reading, Ph.D., licensed clinical neuropsychologist. Mr. Maden completed a clinical interview with Dr. Melvyn Novas, billed as one unit [phone], and 140 minutes of cognitive testing and scoring, billed as one unit [phone] and four additional units [id number].  Psychometrist Milana Kidney, B.S., assisted Dr. Melvyn Novas with test administration  and scoring procedures. As a separate and discrete service, Dr. Melvyn Novas spent a total of 170 minutes in interpretation and report writing billed as one unit [phone] and two units [id number].

## 2021-01-25 ENCOUNTER — Encounter: Payer: Medicare HMO | Admitting: Psychology

## 2021-01-28 ENCOUNTER — Other Ambulatory Visit: Payer: Self-pay | Admitting: Family Medicine

## 2021-01-29 ENCOUNTER — Ambulatory Visit (INDEPENDENT_AMBULATORY_CARE_PROVIDER_SITE_OTHER): Payer: BC Managed Care – PPO | Admitting: Psychology

## 2021-01-29 ENCOUNTER — Other Ambulatory Visit: Payer: Self-pay

## 2021-01-29 DIAGNOSIS — G3184 Mild cognitive impairment, so stated: Secondary | ICD-10-CM

## 2021-01-29 NOTE — Progress Notes (Signed)
   Neuropsychology Feedback Session Rodney Scott. [facility]  Reason for Referral:   Rodney Scott a 70 y.o. right-handed Caucasian male referred by [name], M.D.,to characterize hiscurrent cognitive functioning and assist with diagnostic clarity and treatment planning in the context of prior diagnosis of mild cognitive impairment and concerns for worsening cognitive decline.   Feedback:   [name] completed a comprehensive neuropsychological evaluation on 01/18/2021. Please refer to that encounter for the full report and recommendations. Briefly, results suggested a primary impairment surrounding all aspects of learning and memory. Writing samples also exhibited impairment with evidence for agrammatism (e.g., "Mom mad" or "Grill hot"). However, all other receptive and expressive language assessments were appropriate. A relative weakness was also exhibited across a computerized task assessing hypothesis testing and adaptability; however, performances across other tasks assessing executive functions were appropriate. Relative to his previous evaluation, memory performances were generally stable as primary impairments in verbal and visual memory were also seen across his March 2021 evaluation. Very mild declines were seen across discriminability of a previously learned list of words, as well as delayed recall of aspects of daily living (e.g., medication instructions; name, address, and phone number). An additional performance decline was exhibited across the computerized card sorting task described above. Language assessments were generally stable (writing samples were not assessed during previous evaluations); however, improvements were seen across semantic fluency and confrontation naming tasks. Mild improvements were also seen across attention/concentration. Processing speed and visuospatial abilities were stable.   [name] was unaccompanied  during the current telephone call. He was within car at his work while I was within my office. I discussed the limitations of evaluation and management by telemedicine and the availability of in person appointments. [name] expressed his understanding and agreed to proceed. Content of the current session focused on the results of his neuropsychological evaluation. [name] was given the opportunity to ask questions and his questions were answered. He was encouraged to reach out should additional questions arise. A copy of his report was mailed at the conclusion of the visit.      Less than 16 minutes were spent conducting the current feedback session with [name].

## 2021-02-17 ENCOUNTER — Other Ambulatory Visit: Payer: Self-pay | Admitting: Family Medicine

## 2021-02-27 ENCOUNTER — Telehealth: Payer: BC Managed Care – PPO

## 2021-02-28 ENCOUNTER — Other Ambulatory Visit: Payer: Self-pay | Admitting: Family Medicine

## 2021-03-06 ENCOUNTER — Other Ambulatory Visit: Payer: Self-pay | Admitting: Family Medicine

## 2021-04-09 ENCOUNTER — Encounter: Payer: Self-pay | Admitting: Family Medicine

## 2021-04-09 ENCOUNTER — Other Ambulatory Visit: Payer: Self-pay

## 2021-04-09 ENCOUNTER — Ambulatory Visit (INDEPENDENT_AMBULATORY_CARE_PROVIDER_SITE_OTHER): Payer: BC Managed Care – PPO | Admitting: Family Medicine

## 2021-04-09 VITALS — BP 130/74 | HR 66 | Temp 97.3°F | Ht 76.0 in | Wt 219.8 lb

## 2021-04-09 DIAGNOSIS — R351 Nocturia: Secondary | ICD-10-CM | POA: Diagnosis not present

## 2021-04-09 DIAGNOSIS — F17200 Nicotine dependence, unspecified, uncomplicated: Secondary | ICD-10-CM

## 2021-04-09 DIAGNOSIS — E785 Hyperlipidemia, unspecified: Secondary | ICD-10-CM | POA: Diagnosis not present

## 2021-04-09 DIAGNOSIS — F3342 Major depressive disorder, recurrent, in full remission: Secondary | ICD-10-CM

## 2021-04-09 DIAGNOSIS — G3184 Mild cognitive impairment, so stated: Secondary | ICD-10-CM

## 2021-04-09 DIAGNOSIS — D6851 Activated protein C resistance: Secondary | ICD-10-CM

## 2021-04-09 DIAGNOSIS — R739 Hyperglycemia, unspecified: Secondary | ICD-10-CM

## 2021-04-09 LAB — CBC WITH DIFFERENTIAL/PLATELET
Basophils Absolute: 0.1 10*3/uL (ref 0.0–0.1)
Basophils Relative: 0.6 % (ref 0.0–3.0)
Eosinophils Absolute: 0.2 10*3/uL (ref 0.0–0.7)
Eosinophils Relative: 1.4 % (ref 0.0–5.0)
HCT: 45.7 % (ref 39.0–52.0)
Hemoglobin: 15.3 g/dL (ref 13.0–17.0)
Lymphocytes Relative: 22.7 % (ref 12.0–46.0)
Lymphs Abs: 2.7 10*3/uL (ref 0.7–4.0)
MCHC: 33.4 g/dL (ref 30.0–36.0)
MCV: 95.6 fl (ref 78.0–100.0)
Monocytes Absolute: 0.9 10*3/uL (ref 0.1–1.0)
Monocytes Relative: 7.8 % (ref 3.0–12.0)
Neutro Abs: 7.9 10*3/uL — ABNORMAL HIGH (ref 1.4–7.7)
Neutrophils Relative %: 67.5 % (ref 43.0–77.0)
Platelets: 194 10*3/uL (ref 150.0–400.0)
RBC: 4.78 Mil/uL (ref 4.22–5.81)
RDW: 13.3 % (ref 11.5–15.5)
WBC: 11.7 10*3/uL — ABNORMAL HIGH (ref 4.0–10.5)

## 2021-04-09 LAB — LIPID PANEL
Cholesterol: 163 mg/dL (ref 0–200)
HDL: 54.6 mg/dL (ref >39.00)
LDL Cholesterol: 79 mg/dL (ref 0–99)
NonHDL: 108.59
Total CHOL/HDL Ratio: 3
Triglycerides: 148 mg/dL (ref 0.0–149.0)
VLDL: 29.6 mg/dL (ref 0.0–40.0)

## 2021-04-09 LAB — COMPREHENSIVE METABOLIC PANEL
ALT: 17 U/L (ref 0–53)
AST: 16 U/L (ref 0–37)
Albumin: 4.1 g/dL (ref 3.5–5.2)
Alkaline Phosphatase: 57 U/L (ref 39–117)
BUN: 16 mg/dL (ref 6–23)
CO2: 27 mEq/L (ref 19–32)
Calcium: 9.6 mg/dL (ref 8.4–10.5)
Chloride: 103 mEq/L (ref 96–112)
Creatinine, Ser: 0.9 mg/dL (ref 0.40–1.50)
GFR: 87.29 mL/min (ref >60.00)
Glucose, Bld: 94 mg/dL (ref 70–99)
Potassium: 4.3 mEq/L (ref 3.5–5.1)
Sodium: 139 mEq/L (ref 135–145)
Total Bilirubin: 0.5 mg/dL (ref 0.2–1.2)
Total Protein: 6.2 g/dL (ref 6.0–8.3)

## 2021-04-09 LAB — POC URINALSYSI DIPSTICK (AUTOMATED)
Bilirubin, UA: NEGATIVE
Blood, UA: NEGATIVE
Glucose, UA: NEGATIVE
Ketones, UA: NEGATIVE
Leukocytes, UA: NEGATIVE
Nitrite, UA: NEGATIVE
Protein, UA: NEGATIVE
Spec Grav, UA: 1.01 (ref 1.010–1.025)
Urobilinogen, UA: 0.2 E.U./dL
pH, UA: 7 (ref 5.0–8.0)

## 2021-04-09 LAB — HEMOGLOBIN A1C: Hgb A1c MFr Bld: 6 % (ref 4.6–6.5)

## 2021-04-09 LAB — PSA: PSA: 0.6 ng/mL (ref 0.10–4.00)

## 2021-04-09 NOTE — Progress Notes (Signed)
Phone: [phone]   Subjective:  Patient presents today for their annual physical. Chief complaint-noted.   See problem oriented charting- ROS- full  review of systems was completed and negative  except for: memory changes, bleeds easily on xarelto  The following were reviewed and entered/updated in epic: Past Medical History:  Diagnosis Date   Allergic rhinitis 07/31/2007   Loratadine at times- but mild mental fog on this    BPH associated with nocturia 03/10/2018   Clotting disorder    Essential hypertension, benign 07/26/2014   Benazepril 20mg     Factor V Leiden 07/31/2007   Xarelto 20mg  daily. 2 DVT 2001, PE 2011.     History of colonic polyps 07/31/2007   02/2015 adenoma. 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 Patient states thought was external.      Left inguinal hernia 12/05/2016   Declines surgery referral- minimally bothering him. Can call in if he changes his mind   Major depressive disorder in full remission 03/24/2009   Wellbutrin xl 300mg -->150mg --> off 03/2020  pristiq 50mg .   In past-->remeron 15mg  mainly takes prn for sleep zoloft --> pristiq per sister who is psychiatrist apparetnly   MCI (mild cognitive impairment) 06/15/2015   Osteoarthritis    Right rotator cuff tear 12/07/2010   S/p surgery.     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 COmpression stocking on left. L >R swelling-history of 1st DVT in L.      Patient Active Problem List   Diagnosis Date Noted   MCI (mild cognitive impairment) 06/15/2015    Priority: High   Current smoker 07/26/2014    Priority: High   Factor V Leiden ([place]) 07/31/2007    Priority: High   History of venous thrombosis and embolism  07/31/2007    Priority: High   BPH associated with nocturia 03/10/2018    Priority: Medium   Hyperglycemia 12/05/2016    Priority: Medium   Essential hypertension, benign 07/26/2014    Priority: Medium   Major depressive disorder in full remission 03/24/2009    Priority: Medium   Hyperlipidemia 07/31/2007    Priority: Medium   Senile purpura 09/22/2020    Priority: Low   Left inguinal hernia 12/05/2016    Priority: Low   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    Priority: Low   Osteoarthritis 07/15/2008    Priority: Low   Venous (peripheral) insufficiency 12/11/2007    Priority: Low   Allergic rhinitis 07/31/2007    Priority: Low   History of colonic polyps 07/31/2007    Priority: Low   Past Surgical History:  Procedure Laterality Date   right rotator cuff repair     SALIVARY STONE REMOVAL      Family History  Problem Relation Age of Onset   Memory loss Mother    Colon cancer Father 41   Factor V Leiden deficiency Son    Esophageal cancer Neg Hx    Rectal cancer Neg Hx    Stomach cancer Neg Hx     Medications- reviewed and updated Current Outpatient Medications  Medication Sig Dispense Refill   benazepril (LOTENSIN) 20 MG tablet Take 1 tablet by mouth once daily 90 tablet 0   desvenlafaxine (PRISTIQ) 50 MG  24 hr tablet Take 1 tablet by mouth once daily 90 tablet 0   donepezil (ARICEPT) 10 MG tablet Take 1 tablet (10 mg total) by mouth at bedtime. 90 tablet 3   Loratadine 10 MG CAPS Take by mouth daily.     Multiple Vitamin (MULTIVITAMIN) capsule Take by mouth.     rosuvastatin (CRESTOR) 40 MG tablet Take 1 tablet by mouth once daily 90 tablet 0   XARELTO 20 MG TABS tablet Take 1 tablet by mouth once daily 90 tablet 0   gabapentin (NEURONTIN) 100 MG capsule Take 2 capsules (200 mg total) by mouth 3 (three) times daily. (Patient not taking: Reported on 04/09/2021) 180 capsule 2   No current facility-administered medications for this visit.     Allergies-reviewed and updated Allergies  Allergen Reactions   Yellow Jacket Venom [Bee Venom] Shortness Of Breath    Had swelling localized, chest tightness, and shortness of breath    Social History   Social History Narrative   Married (wife patient of Dr. Yong Channel). Son and daughter. 1 granddaughter.       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 Oropharynx normal Neck: no thyromegaly CV: RRR no murmurs rubs or gallops Lungs: CTAB no crackles, wheeze, rhonchi Abdomen: soft/nontender/nondistended/normal bowel sounds. No rebound or guarding.  Ext: trace edema Skin: warm, dry Neuro: grossly normal, moves all extremities, PERRLA   Assessment and Plan  70 y.o. male presenting for annual physical.  Health Maintenance counseling: 1. Anticipatory guidance: Patient counseled regarding regular dental exams -q6 months, eye exams - yearly,  avoiding smoking and second hand smoke- he continues to smoke , limiting alcohol to 2 beverages per day - maybe 1-2 beers a week.   2. Risk factor reduction:  Advised patient of need for regular exercise and diet rich and fruits and vegetables to reduce risk of heart attack and stroke. Exercise- active with work and at home including mowing yard with push mower- encouraged to remain active- consider exercise Diet-down 4 lbs form last year- trying to eat reasonably healthy.  Wt Readings from Last 3 Encounters:  04/09/21 219 lb 12.8 oz (99.7 kg)   12/28/20 231 lb (104.8 kg)  12/07/20 230 lb (104.3 kg)  3. Immunizations/screenings/ancillary studies Immunization History  Administered Date(s) Administered   Fluad Quad(high Dose 65+) 07/22/2020   Influenza Split 07/26/2011, 08/07/2012   Influenza Whole 07/31/2007, 07/15/2008, 07/07/2009, 06/29/2010   Influenza, High Dose Seasonal PF 09/08/2017, 07/15/2018, 07/10/2019   Influenza,inj,Quad PF,6+ Mos 08/20/2013, 07/26/2014, 06/15/2015, 07/05/2016   Influenza-Unspecified 07/10/2019   PFIZER(Purple Top)SARS-COV-2 Vaccination 11/03/2019, 11/24/2019, 07/22/2020, 03/03/2021   PNEUMOCOCCAL CONJUGATE-20 03/03/2021   Pneumococcal Conjugate-13 06/05/2017   Pneumococcal Polysaccharide-23 06/22/2010, 07/15/2018   Td 10/14/2001   Tdap 01/03/2012   Zoster Recombinat (Shingrix) 07/10/2019, 09/29/2019  4. Prostate cancer screening- nocturia oncea  night stable. Trend psa at least this  year  Lab Results  Component Value Date   PSA 0.38 03/15/2020   PSA 0.40 03/12/2019   PSA 0.40 03/20/2018   5. Colon cancer screening -09/2018 with 3 year repeat- due later this year 6. Skin cancer screening- Dr. Denna Haggard as needed. advised regular sunscreen use. Denies worrisome, changing, or new skin lesions.  7. current smoker- 1/2 PPD. Over 45 pack years. Declines lung cancer screening program. AAA screen sept 2018. UA today 8. STD screening - monogamous so not needed  Status of chronic or acute concerns   #Mild cognitive impairment-was started on Aricept 10 mg by Dr. Delice Lesch. He has tolerated medicine and will continue- has follow up next month with neurology  #facial pain from February visit/potential trigeminal neuralgia- was given gabapentin but has not bene having the same issues recently- continue to monitor  #hyperlipidemia S: Medication:rosuvastatin 40 mg  Lab Results  Component Value Date   CHOL 157 03/15/2020   HDL 49.30 03/15/2020   LDLCALC 88 03/15/2020   LDLDIRECT 86.0 09/13/2019   TRIG 98.0  03/15/2020   CHOLHDL 3 03/15/2020   A/P: hopefully stable- update lipids today. Continue current meds  # Depression S: Medication:pristiq 50 mg  Depression screen [facility] 2/9 04/09/2021 11/10/2020 09/22/2020  Decreased Interest 0 0 0  Down, Depressed, Hopeless 0 0 0  PHQ - 2 Score 0 0 0  Altered sleeping 0 - 0  Tired, decreased energy 0 - 1  Change in appetite 0 - 0  Feeling bad or failure about yourself  0 - 0  Trouble concentrating 0 - 0  Moving slowly or fidgety/restless 0 - 0  Suicidal thoughts 0 - 0  PHQ-9 Score 0 - 1  Difficult doing work/chores Not difficult at all - Not difficult at all   A/P: full remission/continue current medictions  # Hyperglycemia/insulin resistance/prediabetes S:  Medication: none Exercise and diet- see above Lab Results  Component Value Date   HGBA1C 5.6 09/22/2020   HGBA1C 5.9 03/15/2020   HGBA1C 5.9 09/13/2019   A/P: hopefully stable- update a1c today. Continue without meds as long as under 6  #hypertension S: medication: benazepril 20 mg BP Readings from Last 3 Encounters:  04/09/21 130/74  12/28/20 118/77  12/07/20 140/84  A/P: Stable. Continue current medications.   #factor V leiden- patient with history of DVT and PE- remains on chronic xarelto- doing well- continue curren tmeds. No SOB or leg swelling  Recommended follow up: Return in about 6 months (around 10/09/2021) for follow up- or sooner if needed. Future Appointments  Date Time Provider [place]  05/09/2021 10:00 AM Cameron Sprang, MD [facility] None  11/16/2021  8:45 AM [facility] HEALTH COACH [facility]   Lab/Order associations:NOT  fasting   ICD-10-CM   1. Nocturia  R35.1 PSA    2. Hyperlipidemia, unspecified hyperlipidemia type  E78.5 POCT Urinalysis Dipstick (Automated)    CBC with Differential/Platelet    Comprehensive metabolic panel    Lipid panel    3. Current smoker  F17.200 POCT Urinalysis Dipstick (Automated)    4. Hyperglycemia  R73.9 Hemoglobin A1c     5. MCI (mild cognitive impairment)  G31.84     6. Factor V Leiden ([place])  D68.51     7. Recurrent major depressive disorder, in full remission ([place])  F33.42       No orders of the defined types were placed in this encounter.   Return precautions advised.  [name], MD

## 2021-04-09 NOTE — Patient Instructions (Addendum)
Please stop by lab before you go- blood and urine If you have mychart- we will send your results within 3 business days of [place] receiving them.  If you do not have mychart- we will call you about results within 5 business days of [place] receiving them.  *please also note that you will see labs on mychart as soon as they post. I will later go in and write notes on them- will say "notes from Dr. Yong Channel"  The best thing you can do for your health is quitting smoking. You declined lung cancer screening today.   No other changes today- glad you are doing well  Recommended follow up: Return in about 6 months (around 10/09/2021) for follow up- or sooner if needed.

## 2021-04-09 NOTE — Addendum Note (Signed)
Addended by: Jacob Moores on: 04/09/2021 11:37 AM   Modules accepted: Orders

## 2021-05-04 ENCOUNTER — Other Ambulatory Visit: Payer: Self-pay | Admitting: Family Medicine

## 2021-05-09 ENCOUNTER — Other Ambulatory Visit: Payer: Self-pay

## 2021-05-09 ENCOUNTER — Encounter: Payer: Self-pay | Admitting: Neurology

## 2021-05-09 ENCOUNTER — Ambulatory Visit (INDEPENDENT_AMBULATORY_CARE_PROVIDER_SITE_OTHER): Payer: BC Managed Care – PPO | Admitting: Neurology

## 2021-05-09 VITALS — HR 77 | Ht 77.0 in | Wt 225.4 lb

## 2021-05-09 DIAGNOSIS — G3184 Mild cognitive impairment, so stated: Secondary | ICD-10-CM

## 2021-05-09 DIAGNOSIS — G500 Trigeminal neuralgia: Secondary | ICD-10-CM

## 2021-05-09 MED ORDER — DONEPEZIL HCL 10 MG PO TABS: 10.0000 mg | ORAL_TABLET | Freq: Every day | ORAL | 3 refills | Status: DC

## 2021-05-09 NOTE — Patient Instructions (Signed)
Good to see you. Continue Donepezil '10mg'$  daily. Follow-up in 6 months, call for any changes   FALL PRECAUTIONS: Be cautious when walking. Scan the area for obstacles that may increase the risk of trips and falls. When getting up in the mornings, sit up at the edge of the bed for a few minutes before getting out of bed. Consider elevating the bed at the head end to avoid drop of blood pressure when getting up. Walk always in a well-lit room (use night lights in the walls). Avoid area rugs or power cords from appliances in the middle of the walkways. Use a walker or a cane if necessary and consider physical therapy for balance exercise. Get your eyesight checked regularly.  FINANCIAL OVERSIGHT: Supervision, especially oversight when making financial decisions or transactions is also recommended as difficulties arise.  HOME SAFETY: Consider the safety of the kitchen when operating appliances like stoves, microwave oven, and blender. Consider having supervision and share cooking responsibilities until no longer able to participate in those. Accidents with firearms and other hazards in the house should be identified and addressed as well.  DRIVING: Regarding driving, in patients with progressive memory problems, driving will be impaired. We advise to have someone else do the driving if trouble finding directions or if minor accidents are reported. Independent driving assessment is available to determine safety of driving.  ABILITY TO BE LEFT ALONE: If patient is unable to contact 911 operator, consider using LifeLine, or when the need is there, arrange for someone to stay with patients. Smoking is a fire hazard, consider supervision or cessation. Risk of wandering should be assessed by caregiver and if detected at any point, supervision and safe proof recommendations should be instituted.  MEDICATION SUPERVISION: Inability to self-administer medication needs to be constantly addressed. Implement a mechanism  to ensure safe administration of the medications.  RECOMMENDATIONS FOR ALL PATIENTS WITH MEMORY PROBLEMS: 1. Continue to exercise (Recommend 30 minutes of walking everyday, or 3 hours every week) 2. Increase social interactions - continue going to [place] and enjoy social gatherings with friends and family 3. Eat healthy, avoid fried foods and eat more fruits and vegetables 4. Maintain adequate blood pressure, blood sugar, and blood cholesterol level. Reducing the risk of stroke and cardiovascular disease also helps promoting better memory. 5. Avoid stressful situations. Live a simple life and avoid aggravations. Organize your time and prepare for the next day in anticipation. 6. Sleep well, avoid any interruptions of sleep and avoid any distractions in the bedroom that may interfere with adequate sleep quality 7. Avoid sugar, avoid sweets as there is a strong link between excessive sugar intake, diabetes, and cognitive impairment We discussed the Mediterranean diet, which has been shown to help patients reduce the risk of progressive memory disorders and reduces cardiovascular risk. This includes eating fish, eat fruits and green leafy vegetables, nuts like almonds and hazelnuts, walnuts, and also use olive oil. Avoid fast foods and fried foods as much as possible. Avoid sweets and sugar as sugar use has been linked to worsening of memory function.      Mediterranean Diet  Why follow it? Research shows. Those who follow the Mediterranean diet have a reduced risk of heart disease  The diet is associated with a reduced incidence of Parkinson's and Alzheimer's diseases People following the diet may have longer life expectancies and lower rates of chronic diseases  The Dietary Guidelines for Americans recommends the Mediterranean diet as an eating plan to promote health and prevent  disease  What Is the Mediterranean Diet?  Healthy eating plan based on typical foods and recipes of Mediterranean-style  cooking The diet is primarily a plant based diet; these foods should make up a majority of meals   Starches - Plant based foods should make up a majority of meals - They are an important sources of vitamins, minerals, energy, antioxidants, and fiber - Choose whole grains, foods high in fiber and minimally processed items  - Typical grain sources include wheat, oats, barley, corn, brown rice, bulgar, farro, millet, polenta, couscous  - Various types of beans include chickpeas, lentils, fava beans, black beans, white beans   Fruits  Veggies - Large quantities of antioxidant rich fruits & veggies; 6 or more servings  - Vegetables can be eaten raw or lightly drizzled with oil and cooked  - Vegetables common to the traditional Mediterranean Diet include: artichokes, arugula, beets, broccoli, brussel sprouts, cabbage, carrots, celery, collard greens, cucumbers, eggplant, kale, leeks, lemons, lettuce, mushrooms, okra, onions, peas, peppers, potatoes, pumpkin, radishes, rutabaga, shallots, spinach, sweet potatoes, turnips, zucchini - Fruits common to the Mediterranean Diet include: apples, apricots, avocados, cherries, clementines, dates, figs, grapefruits, grapes, melons, nectarines, oranges, peaches, pears, pomegranates, strawberries, tangerines  Fats - Replace butter and margarine with healthy oils, such as olive oil, canola oil, and tahini  - Limit nuts to no more than a handful a day  - Nuts include walnuts, almonds, pecans, pistachios, pine nuts  - Limit or avoid candied, honey roasted or heavily salted nuts - Olives are central to the [employer] - can be eaten whole or used in a variety of dishes   Meats Protein - Limiting red meat: no more than a few times a month - When eating red meat: choose lean cuts and keep the portion to the size of deck of cards - Eggs: approx. 0 to 4 times a week  - Fish and lean poultry: at least 2 a week  - Healthy protein sources include, chicken, [place],  lean beef, lamb - Increase intake of seafood such as tuna, salmon, trout, mackerel, shrimp, scallops - Avoid or limit high fat processed meats such as sausage and bacon  Dairy - Include moderate amounts of low fat dairy products  - Focus on healthy dairy such as fat free yogurt, skim milk, low or reduced fat cheese - Limit dairy products higher in fat such as whole or 2% milk, cheese, ice cream  Alcohol - Moderate amounts of red wine is ok  - No more than 5 oz daily for women (all ages) and men older than age 3  - No more than 10 oz of wine daily for men younger than 45  Other - Limit sweets and other desserts  - Use herbs and spices instead of salt to flavor foods  - Herbs and spices common to the traditional Mediterranean Diet include: basil, bay leaves, chives, cloves, cumin, fennel, garlic, lavender, marjoram, mint, oregano, parsley, pepper, rosemary, sage, savory, sumac, tarragon, thyme   It's not just a diet, it's a lifestyle:  The Mediterranean diet includes lifestyle factors typical of those in the region  Foods, drinks and meals are best eaten with others and savored Daily physical activity is important for overall good health This could be strenuous exercise like running and aerobics This could also be more leisurely activities such as walking, housework, yard-work, or taking the stairs Moderation is the key; a balanced and healthy diet accommodates most foods and drinks Consider portion sizes and  frequency of consumption of certain foods   Meal Ideas & Options:  Breakfast:  Whole wheat toast or whole wheat English muffins with peanut butter & hard boiled egg Steel cut oats topped with apples & cinnamon and skim milk  Fresh fruit: banana, strawberries, melon, berries, peaches  Smoothies: strawberries, bananas, greek yogurt, peanut butter Low fat greek yogurt with blueberries and granola  Egg white omelet with spinach and mushrooms Breakfast couscous: whole wheat couscous,  apricots, skim milk, cranberries  Sandwiches:  Hummus and grilled vegetables (peppers, zucchini, squash) on whole wheat bread   Grilled chicken on whole wheat pita with lettuce, tomatoes, cucumbers or tzatziki  [place] salad on whole wheat bread: tuna salad made with greek yogurt, olives, red peppers, capers, green onions Garlic rosemary lamb pita: lamb sauted with garlic, rosemary, salt & pepper; add lettuce, cucumber, greek yogurt to pita - flavor with lemon juice and black pepper  Seafood:  Mediterranean grilled salmon, seasoned with garlic, basil, parsley, lemon juice and black pepper Shrimp, lemon, and spinach whole-grain pasta salad made with low fat greek yogurt  Seared scallops with lemon orzo  Seared tuna steaks seasoned salt, pepper, coriander topped with tomato mixture of olives, tomatoes, olive oil, minced garlic, parsley, green onions and cappers  Meats:  Herbed greek chicken salad with kalamata olives, cucumber, feta  Red bell peppers stuffed with spinach, bulgur, lean ground beef (or lentils) & topped with feta   Kebabs: skewers of chicken, tomatoes, onions, zucchini, squash  [place] burgers: made with red onions, mint, dill, lemon juice, feta cheese topped with roasted red peppers Vegetarian Cucumber salad: cucumbers, artichoke hearts, celery, red onion, feta cheese, tossed in olive oil & lemon juice  Hummus and whole grain pita points with a greek salad (lettuce, tomato, feta, olives, cucumbers, red onion) Lentil soup with celery, carrots made with vegetable broth, garlic, salt and pepper  Tabouli salad: parsley, bulgur, mint, scallions, cucumbers, tomato, radishes, lemon juice, olive oil, salt and pepper.

## 2021-05-09 NOTE — Progress Notes (Signed)
NEUROLOGY FOLLOW UP OFFICE NOTE  [name] RT:[id number] May 31, 1951  HISTORY OF PRESENT ILLNESS: I had the pleasure of seeing Rodney Scott in follow-up in the neurology clinic on 05/09/2021.  The patient was last seen 4 months ago for memory loss and trigeminal neuralgia. He is again accompanied by his wife who helps supplement the history today.  Records and images were personally reviewed where available.  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 He otherwise did well, still meeting criteria for Mild Neurocognitive disorder, with very mild declines compared to prior testing in 2021, improvements in some tasks. Etiology remains unclear, still concerning for a degenerative process affecting memory processes such as Alzheimer's disease however trajectory was inconsistent with the improvements noted on his testing. Primary progressive aphasia could not be ruled out, but test results did not strongly suggest this currently.  Since his last visit, he feels memory is the same. He makes a grocery list and cannot find the list. He continues to work but has to think where he has to go, even schools he has been to multiple times in the past. He would have to use his GPS. He denies missing medications. His wife notes he had missed a few bills and has taken over some. His wife notes he is a little bit more confused or forgetful, repeating a question. She has noticed he would sigh loudly, which he did not do in the past. He does not notice it. He denies feeling frustrated. His wife also notes some disinhibition, he would say off color/"dirty"/inappropriate jokes that he used to say in the 1970s, more noticeable the past 6-8 months. He does not think they are bad and would get upset when she alerts him. Sleep is  good. No paranoia or hallucinations. He denies any further facial pain so he stopped the gabapentin a few months ago.    History on Initial Assessment 09/25/2018: This is a 70 year old right-handed man with a history of hypertension, hyperlipidemia, Factor V Leiden deficiency, DVT on Xarelto, presenting for evaluation of worsening memory. He and his wife started noticing changes over the past year. He states long-term memory is much better than remembering what he did yesterday. He misplaces things frequently at home, they have lived in the same house for 30 years and his wife has to search for things that have always been in the same place for years. He denies getting lost driving but uses his GPS a lot, he cannot remember the way to places like before. He procrastinates on bills but generally does not miss bills, his wife nods that he has. He occasionally forgets his medications. 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 His wife's major concern is how this has affected caring for their 8 year old granddaughter who has been living with them for the past 5 years. He forgets to feed her, which is new. He goes to a store and forgets what he is doing, she has to give him a pretty detailed note so he can remember events for the day. She has noticed his language skills have decreased markedly, he is not a very verbal person, but she has noticed more difficulty expressing his thoughts or finding words. He may have a thought but stumbles over the words. He repeats himself, one time last summer  he told the same story 3-4 times within half an hour. He has always been easily frustrated, but she feels it is worse, such as when driving, "no one knows what they are doing." No paranoia or hallucinations.    He denies any headaches, dizziness, vision changes, neck/back pain, focal numbness/tingling/weakness, bowel/bladder dysfunction, or tremors. He has lost his sense of smell over  the past year for no clear reason. He denies any head injuries. His mother had dementia in her 66s. 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 When asked about mood, he states "I don't know." His wife does not think he is depressed, she is unsure why he is still taking the Wellbutrin and Pristiq, she feels Wellbutrin was started 20 years ago for smoking cessation.   Diagnostic Data: MRI brain with and without contrast in 10/2018 no acute changes, there is mild chronic microvascular disease.  MRI face/trigeminal with and without contrast done 11/30/2020 which did not show any compressive lesion/vascular abnormality on the left. There were degenerative changes in the temporomandibular joints and cervical spine.  [facility]psychological evaluation at [facility] in January 2020 indicated very mild behaviorally observed expressive speech difficulty and select executive dysfunction, diagnosis of unspecified Mild Cognitive Impairment (possibly vascular), unspecified depressive disorder. It was noted that his only language deficit included behaviorally observed occasional use of filler words and difficulty verbally describing some experiences. All objective language testing was within normal limits, no expressive language order at this time  Neuropsychological testing in March 2021 did not show any appreciable change from prior testing with respect to memory affecting learning and retention of both verbal and visual information. Profile concerning for a developing progressive aphasia condition although he was not demonstrating frank language impairments on testing.   Repeat Neuropsychological testing in April 2022 noted primary impairment surrounding all aspects of learning and memory, agrammatism, however all other receptive and expressive language assessments  were appropriate. He otherwise did well, still meeting criteria for Mild Neurocognitive disorder, with very mild declines compared to prior testing, improvements in some tasks. Etiology remains unclear, still concerning for a degenerative process affecting memory processes such as Alzheimer's disease however trajectory was inconsistent with the improvements noted on his testing. Primary progressive aphasia could not be ruled out, but test results did not strongly suggest this currently.   PAST MEDICAL HISTORY: Past Medical History:  Diagnosis Date   Allergic rhinitis 07/31/2007   Loratadine at times- but mild mental fog on this    BPH associated with nocturia 03/10/2018   Clotting disorder    Essential hypertension, benign 07/26/2014   Benazepril '20mg'$     Factor V Leiden 07/31/2007   Xarelto '20mg'$  daily. 2 DVT 2001, PE 2011.     History of colonic polyps 07/31/2007   02/2015 adenoma. 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 Patient states thought was external.      Left inguinal hernia 12/05/2016   Declines surgery referral- minimally bothering him. Can call in if he changes his mind   Major depressive disorder in full remission 03/24/2009   Wellbutrin xl '300mg'$ -->'150mg'$ --> off 03/2020  pristiq '50mg'$ .   In past-->remeron '15mg'$  mainly takes  prn for sleep zoloft --> pristiq per sister who is psychiatrist apparetnly   MCI (mild cognitive impairment) 06/15/2015   Osteoarthritis    Right rotator cuff tear 12/07/2010   S/p surgery.     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 COmpression stocking on left. L >R swelling-history of 1st DVT in L.       MEDICATIONS: Current Outpatient Medications on File Prior to Visit   Medication Sig Dispense Refill   benazepril (LOTENSIN) 20 MG tablet Take 1 tablet by mouth once daily 90 tablet 3   desvenlafaxine (PRISTIQ) 50 MG 24 hr tablet Take 1 tablet by mouth once daily 90 tablet 0   donepezil (ARICEPT) 10 MG tablet Take 1 tablet (10 mg total) by mouth at bedtime. 90 tablet 3   Loratadine 10 MG CAPS Take by mouth daily.     Multiple Vitamin (MULTIVITAMIN) capsule Take by mouth.     rosuvastatin (CRESTOR) 40 MG tablet Take 1 tablet by mouth once daily 90 tablet 0   XARELTO 20 MG TABS tablet Take 1 tablet by mouth once daily 90 tablet 0   No current facility-administered medications on file prior to visit.    ALLERGIES: Allergies  Allergen Reactions   Yellow Jacket Venom [Bee Venom] Shortness Of Breath    Had swelling localized, chest tightness, and shortness of breath    FAMILY HISTORY: Family History  Problem Relation Age of Onset   Memory loss Mother    Colon cancer Father 8   Factor V Leiden deficiency Son    Esophageal cancer Neg Hx    Rectal cancer Neg Hx    Stomach cancer Neg Hx     SOCIAL HISTORY: Social History   Socioeconomic History   Marital status: Married    Spouse name: Not on file   Number of children: 2   Years of education: 12   Highest education level: High school graduate  Occupational History   Occupation: Maintenance  Tobacco Use   Smoking status: Every Day    Packs/day: 1.00    Years: 45.00    Pack years: 45.00    Types: Cigarettes   Smokeless tobacco: Never  Vaping Use   Vaping Use: Never used  Substance and Sexual Activity   Alcohol use: Yes    Comment: rare alcohol consumption   Drug use: No   Sexual activity: Yes  Other Topics Concern   Not on file  Social History Narrative   Married (wife patient of Dr. Yong Channel). Son and daughter. 1 granddaughter.       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: time  around house, time with family, yardwork, some Walker in 2 story home with wife and granddaughter, [employer] geologist school graduate      Right handed    Social Determinants of Health   Financial Resource Strain: Low Risk    Difficulty of Paying Living Expenses: Not hard at all  Food Insecurity: No Food Insecurity   Worried About Charity fundraiser in the Last Year: Never true   Arboriculturist in the Last Year: Never true  Transportation Needs: No Transportation Needs   Lack of Transportation (Medical): No   Lack of Transportation (Non-Medical): No  Physical Activity: Inactive   Days of Exercise per Week: 0 days   Minutes of Exercise per Session: 0 min  Stress: No Stress Concern Present   Feeling of Stress : Not at all  Social Connections: Moderately Isolated   Frequency of Communication with Friends and Family: Twice a week   Frequency of Social Gatherings with Friends and Family: Once a week   Attends Religious Services: Never   Marine scientist or Organizations: No   Attends Music therapist: Never   Marital Status: Married  Human resources officer Violence: Not At Risk   Fear of Current or Ex-Partner: No   Emotionally Abused: No   Physically Abused: No   Sexually Abused: No     PHYSICAL EXAM: Vitals:   05/09/21 0947  Pulse: 77  SpO2: 96%   General: No acute distress Head:  Normocephalic/atraumatic Skin/Extremities: No rash, no edema Neurological Exam: alert and awake. No aphasia, speech today is fluent. No dysarthria. Fund of knowledge is appropriate.  Attention and concentration are normal.   Cranial nerves: Pupils equal, round. Extraocular movements intact with no nystagmus. Visual fields full.  No facial asymmetry.  Motor: Bulk and tone normal, muscle strength 5/5 throughout with no pronator drift.   Finger to nose testing intact.  Gait narrow-based and steady, no ataxia   IMPRESSION: This is a 70 yo RH man with a history of   hypertension, hyperlipidemia, Factor V Leiden deficiency, DVT on Xarelto, with mild cognitive impairment. He has had 3 Neuropsychological evaluations, we discussed most recent done 01/2021 with primary impairment in all aspects of learning and memory, agrammatism. However, he had improvement in some tasks, which is inconsistent with typical trajectory of Alzheimer's disease. Primary progressive aphasia could not be ruled out, but results did not strongly suggest this. We discussed that at this point there is a slowly progressive neurodegenerative process and he should continue Donepezil '10mg'$  daily, physical exercise, MIND diet. We discussed safety issues, his wife checks behind with medications and finances, monitor driving. His wife notes more disinhibition, can discuss SSRI adjustment with PCP. He has stopped gabapentin, no further facial pain. Follow-up in 6 months, call for any changes.   Thank you for allowing me to participate in his care.  Please do not hesitate to call for any questions or concerns.    [name], M.D.   CC: Dr. Yong Channel

## 2021-05-20 ENCOUNTER — Other Ambulatory Visit: Payer: Self-pay | Admitting: Family Medicine

## 2021-06-03 ENCOUNTER — Other Ambulatory Visit: Payer: Self-pay | Admitting: Family Medicine

## 2021-06-15 ENCOUNTER — Telehealth: Payer: Self-pay | Admitting: Pharmacist

## 2021-06-15 NOTE — Chronic Care Management (AMB) (Addendum)
    Chronic Care Management Pharmacy Assistant   Name: Rodney Scott  MRN: [id number] DOB: Feb 16, 1951   Reason for Encounter: General Adherence Call    Recent office visits:  04/09/2021 OV (PCP) Marin Olp, MD; no medication changes indicated.  Recent consult visits:  05/09/2021 OV (neurology) Cameron Sprang, MD; no medication changes  01/29/2021 OV (psychology) Hazle Coca, PhD; no medication changes indicated.  01/18/2021 OV (psychology) Hazle Coca, PhD; no medication changes indicated.  Hospital visits:  None in previous 6 months  Medications: Outpatient Encounter Medications as of 06/15/2021  Medication Sig   benazepril (LOTENSIN) 20 MG tablet Take 1 tablet by mouth once daily   desvenlafaxine (PRISTIQ) 50 MG 24 hr tablet Take 1 tablet by mouth once daily   donepezil (ARICEPT) 10 MG tablet Take 1 tablet (10 mg total) by mouth at bedtime.   Loratadine 10 MG CAPS Take by mouth daily.   Multiple Vitamin (MULTIVITAMIN) capsule Take by mouth.   rosuvastatin (CRESTOR) 40 MG tablet Take 1 tablet by mouth once daily   XARELTO 20 MG TABS tablet Take 1 tablet by mouth once daily   No facility-administered encounter medications on file as of 06/15/2021.   Patient Questions: Have you had any problems recently with your health? Patient states he has not had any problems recently with his health.  Have you had any problems with your pharmacy? Patient states he has not had any problems recently with his pharmacy.  What issues or side effects are you having with your medications? Patient states he has not had any issues or side effects with any of his medications.  What would you like me to pass along to Leata Mouse, CPP for him to help you with?  Patient does not have anything to pass along at this time.  What can we do to take care of you better? Patient does not have any suggestions.  Future Appointments  Date Time Provider [place]  10/17/2021  8:40 AM  Marin Olp, MD [facility]  11/09/2021  9:00 AM Rondel Jumbo, PA-C [facility] None  11/16/2021  8:45 AM [facility] HEALTH COACH [facility]  05/16/2022 10:00 AM Cameron Sprang, MD [facility] None     Star Rating Drugs: Benazepril 20 mg tablet last filled 03/08/2020 90 DS Rosuvastatin 40 mg tablet last filled 11/27/2020 90 DS  April D Calhoun, [place] Pharmacist Assistant [phone]   5 minutes spent in review, coordination, and documentation.  Reviewed by: Beverly Milch, PharmD Clinical Pharmacist [phone]

## 2021-08-19 ENCOUNTER — Other Ambulatory Visit: Payer: Self-pay | Admitting: Family Medicine

## 2021-09-01 ENCOUNTER — Other Ambulatory Visit: Payer: Self-pay | Admitting: Family Medicine

## 2021-09-24 ENCOUNTER — Encounter: Payer: Self-pay | Admitting: Gastroenterology

## 2021-09-28 NOTE — Progress Notes (Incomplete)
Phone [phone] In person visit   Subjective:   Rodney Scott is a 70 y.o. year old very pleasant male patient who presents for/with See problem oriented charting No chief complaint on file.   This visit occurred during the SARS-CoV-2 public health emergency.  Safety protocols were in place, including screening questions prior to the visit, additional usage of staff PPE, and extensive cleaning of exam room while observing appropriate contact time as indicated for disinfecting solutions.   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 90 tablet 3   Loratadine 10 MG CAPS Take by mouth daily.     Multiple Vitamin (MULTIVITAMIN) capsule Take by mouth.     rosuvastatin (CRESTOR) 40 MG tablet Take 1 tablet by mouth once daily 90 tablet 0   XARELTO 20 MG TABS tablet Take 1 tablet by mouth once daily 90 tablet 0   No current facility-administered medications for this visit.     Objective:   There were no vitals taken for this visit. Gen: NAD, resting comfortably CV: RRR no murmurs rubs or gallops Lungs: CTAB no crackles, wheeze, rhonchi Abdomen: soft/nontender/nondistended/normal bowel sounds. No rebound or guarding.  Ext: no edema Skin: warm, dry Neuro: grossly normal, moves all extremities  ***    Assessment and Plan   ***Medicare AWVS: 11/10/20   #Mild cognitive impairment-was started on Aricept 10 mg by Dr. Delice Lesch. He was doing well with this medicine   #facial pain from February visit/potential trigeminal neuralgia- was given gabapentin but had not bene having the same issues around the time- continued to monitor   #hyperlipidemia S: Medication:rosuvastatin 40 mg daily Lab Results  Component Value Date   CHOL 163 04/09/2021   HDL 54.60 04/09/2021   LDLCALC 79 04/09/2021   LDLDIRECT 86.0 09/13/2019   TRIG 148.0 04/09/2021   CHOLHDL 3 04/09/2021   A/P: ***  # Depression S: Medication:pristiq 50 mg daily Depression screen [facility] 2/9 04/09/2021 11/10/2020 09/22/2020  Decreased Interest 0 0 0  Down, Depressed, Hopeless 0 0 0  PHQ - 2 Score 0 0 0  Altered sleeping 0 - 0  Tired, decreased energy 0 - 1  Change in appetite 0 - 0  Feeling bad or failure about yourself  0 - 0  Trouble concentrating 0 - 0  Moving slowly or fidgety/restless 0 - 0  Suicidal thoughts 0 - 0  PHQ-9 Score 0 - 1  Difficult doing work/chores Not difficult at all - Not difficult at all   A/P: ***    #  Hyperglycemia/insulin resistance/prediabetes S:  Medication: none Exercise and diet- *** Lab Results  Component Value Date   HGBA1C 6.0 04/09/2021   HGBA1C 5.6 09/22/2020   HGBA1C 5.9 03/15/2020    A/P: ***  #hypertension S: medication: benazepril 20 mg daily Home readings #s: *** BP Readings from Last 3 Encounters:  04/09/21 130/74  12/28/20 118/77  12/07/20 140/84  A/P: ***  #factor V leiden- patient with history of DVT and PE- remained on chronic xarelto- doing well-  continue curren tmeds. No SOB or leg swelling  Health Maintenance Due  Topic Date Due   COVID-19 Vaccine (5 - Booster for Pfizer series) 04/28/2021   COLONOSCOPY (Pts 45-14yrs Insurance coverage will need to be confirmed)  09/24/2021   Recommended follow up: No follow-ups on file. Future Appointments  Date Time Provider [place]  10/17/2021  8:40 AM Marin Olp, MD [facility]  11/09/2021  9:00 AM Rondel Jumbo, PA-C [facility] None  11/16/2021  8:45 AM [facility] HEALTH COACH [facility]  05/16/2022 10:00 AM Cameron Sprang, MD [facility] None    Lab/Order associations: No diagnosis found.  No orders of the defined types were placed in this encounter.   I,Jada Bradford,acting as a scribe for [name], MD.,have documented all relevant documentation on the behalf of [name], MD,as directed by  [name], MD while in the presence of [name], MD.  *** Return precautions advised.  Burnett Corrente

## 2021-10-17 ENCOUNTER — Ambulatory Visit: Payer: BC Managed Care – PPO | Admitting: Family Medicine

## 2021-10-17 DIAGNOSIS — F3342 Major depressive disorder, recurrent, in full remission: Secondary | ICD-10-CM

## 2021-10-17 DIAGNOSIS — G3184 Mild cognitive impairment, so stated: Secondary | ICD-10-CM

## 2021-10-17 DIAGNOSIS — E785 Hyperlipidemia, unspecified: Secondary | ICD-10-CM

## 2021-10-17 DIAGNOSIS — I10 Essential (primary) hypertension: Secondary | ICD-10-CM

## 2021-10-17 DIAGNOSIS — R739 Hyperglycemia, unspecified: Secondary | ICD-10-CM

## 2021-11-09 ENCOUNTER — Ambulatory Visit (INDEPENDENT_AMBULATORY_CARE_PROVIDER_SITE_OTHER): Payer: BC Managed Care – PPO | Admitting: Physician Assistant

## 2021-11-09 ENCOUNTER — Other Ambulatory Visit: Payer: Self-pay

## 2021-11-09 ENCOUNTER — Encounter: Payer: Self-pay | Admitting: Physician Assistant

## 2021-11-09 VITALS — BP 108/71 | HR 63 | Ht 77.0 in | Wt 230.8 lb

## 2021-11-09 DIAGNOSIS — G3184 Mild cognitive impairment, so stated: Secondary | ICD-10-CM | POA: Diagnosis not present

## 2021-11-09 DIAGNOSIS — R413 Other amnesia: Secondary | ICD-10-CM | POA: Diagnosis not present

## 2021-11-09 MED ORDER — DONEPEZIL HCL 10 MG PO TABS: 10.0000 mg | ORAL_TABLET | Freq: Every day | ORAL | 3 refills | Status: DC

## 2021-11-09 NOTE — Progress Notes (Signed)
Assessment/Plan:   Mild Cognitive Impairment  MMSE today was 22/30, with very mild changes in testing.  Delayed recall 0/3.    Recommendations:  Discussed safety both in and out of the home.  Discussed the importance of regular daily schedule with inclusion of crossword puzzles to maintain brain function.  Continue to monitor mood by PCP Stay active at least 30 minutes at least 3 times a week.  Naps should be scheduled and should be no longer than 60 minutes and should not occur after 2 PM.  Mediterranean diet is recommended  Control cardiovascular risk factors  Continue donepezil 10 mg daily Side effects were discussed Follow up in  6 months after neurocognitive testing   Case discussed with Dr. Delice Lesch who agrees with the plan     Subjective:    Rodney Scott is a very pleasant 71 y.o. RH male with a history of hypertension, hyperlipidemia, factor V [place] deficiency, history of DVT on Xarelto, mild cognitive impairment seen today in follow up for memory loss. This patient is accompanied in the office by his who supplements the history.  Previous records as well as any outside records available were reviewed prior to todays visit.  Patient was last seen at our office on  05/09/21.  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  Etiology still remains unclear, still concerning for a degenerative process affecting the memory processes such as Alzheimer's disease, however the trajectory is inconsistent with the improvements noted on his testing.  PPA could not be ruled out, but the test results did not strongly suggest this at this time. Currently, he is on Aricept 10 mg daily,  tolerating well.    Since his last visit, the memory feels about the same,  but e may be asking the same questions more often, "frustrating me a little ".  His wife also notices some disinhibition but he is less verbal and does not initiate a conversation, at times "he gets hung up on it ".  He still makes a grocery list and then cannot find the list , so his wife has begun texting him what he needs there. When he is at the store but he cannot remember what to do there.  She defines it  as "problem solving being worse ".  For example, he was always leaving at 6:45 AM to pick up his granddaughter to take her to school, and instead, he left at 6:15 AM, and was not aware that was a mistake.  He repeated this behavior several times over the last 3 months.  He also leaves food box open and in "weird places ".  He takes things out of the dishwasher, and places then in different locations.  He had is toward container and was looking at him without reading the label, could not understand what it was inside.  He continues to work but has to think where he has to go, even if he has been there multiple times.  "He cannot remember the street or the building that he knew for long time ".  He has to use his GPS while driving.  His wife is in charge of the medications, after he is missing some doses.  His wife is in charge of  the finances. Sleep is good, although he had reports vivid dreams that he cannot remember ones waking up, denies REM behavior or sleepwalking.  No paranoia or hallucinations. He denies any headaches, dizziness, vision changes, neck or back pain, focal numbness, tingling, weakness, bladder or bowel dysfunction, or tremors.  He has lost his sense of smell and taste about 2 years ago for no clear reason.  No recent COVID       History on Initial Assessment 09/25/2018: This is a 71 year old right-handed man with a history of hypertension, hyperlipidemia, Factor V Leiden deficiency, DVT on Xarelto,  presenting for evaluation of worsening memory. He and his wife started noticing changes over the past year. He states long-term memory is much better than remembering what he did yesterday. He misplaces things frequently at home, they have lived in the same house for 30 years and his wife has to search for things that have always been in the same place for years. He denies getting lost driving but uses his GPS a lot, he cannot remember the way to places like before. He procrastinates on bills but generally does not miss bills, his wife nods that he has. He occasionally forgets his medications. 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 His wife's major concern is how this has affected caring for their 32 year old granddaughter who has been living with them for the past 5 years. He forgets to feed her, which is new. He goes to a store and forgets what he is doing, she has to give him a pretty detailed note so he can remember events for the day. She has noticed his language skills have decreased markedly, he is not a very verbal person, but she has noticed more difficulty expressing his thoughts or finding words. He may have a thought but stumbles over the words. He repeats himself, one time last summer he told the same story 3-4 times within half an hour. He has always been easily frustrated, but she feels it is worse, such as when driving, "no one knows what they are doing." No paranoia or hallucinations.    He denies any headaches, dizziness, vision changes, neck/back pain, focal numbness/tingling/weakness, bowel/bladder dysfunction, or tremors. He has lost his sense of smell over the past year for no clear reason. He denies any head injuries. His mother had dementia in her 54s. 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 When asked about mood, he states "I don't know." His wife does not think he is depressed, she is unsure why he is still taking the Wellbutrin and Pristiq, she feels Wellbutrin was started 20 years ago for smoking cessation.    Diagnostic Data: MRI brain with and without contrast in 10/2018 no acute changes, there is mild chronic microvascular disease.   MRI face/trigeminal with and without contrast done 11/30/2020 which did not show any compressive lesion/vascular abnormality on the left. There were degenerative changes in the temporomandibular joints and cervical spine.   Neuropsychological evaluation at [facility] in January 2020 indicated very mild behaviorally observed expressive speech difficulty and select executive dysfunction, diagnosis of unspecified Mild Cognitive Impairment (possibly vascular), unspecified depressive disorder. It was noted that his only language deficit included behaviorally observed occasional use of filler words and difficulty verbally describing some  experiences. All objective language testing was within normal limits, no expressive language order at this time   Neuropsychological testing in March 2021 did not show any appreciable change from prior testing with respect to memory affecting learning and retention of both verbal and visual information. Profile concerning for a developing progressive aphasia condition although he was not demonstrating frank language impairments on testing.    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 He otherwise did well, still meeting criteria for Mild Neurocognitive disorder, with very mild declines compared to prior testing, improvements in some tasks. Etiology remains unclear, still concerning for a degenerative process affecting memory processes such as Alzheimer's  disease however trajectory was inconsistent with the improvements noted on his testing. Primary progressive aphasia could not be ruled out, but test results did not strongly suggest this currently.   Neuropsychological Evaluation , Dr. Melvyn Novas  01/18/2021. " results suggested a primary impairment surrounding all aspects of learning and memory. Writing samples also exhibited impairment with evidence for agrammatism (e.g., "Mom mad" or "Grill hot"). However, all other receptive and expressive language assessments were appropriate. A relative weakness was also exhibited across a computerized task assessing hypothesis testing and adaptability; however, performances across other tasks assessing executive functions were appropriate. Relative to his previous evaluation, memory performances were generally stable as primary impairments in verbal and visual memory were also seen across his March 2021 evaluation. Very mild declines were seen across discriminability of a previously learned list of words, as well as delayed recall of aspects of daily living (e.g., medication instructions; name, address, and phone number). An additional performance decline was exhibited across the computerized card sorting task described above. Language assessments were generally stable (writing samples were not assessed during previous evaluations); however, improvements were seen across semantic fluency and confrontation naming tasks. Mild improvements were also seen across attention/concentration. Processing speed and visuospatial abilities were stable.   PREVIOUS MEDICATIONS:   CURRENT MEDICATIONS:  Outpatient Encounter Medications as of 11/09/2021  Medication Sig   benazepril (LOTENSIN) 20 MG tablet Take 1 tablet by mouth once daily   desvenlafaxine (PRISTIQ) 50 MG 24 hr tablet Take 1 tablet by mouth once daily   Loratadine 10 MG CAPS Take by mouth daily.   Multiple Vitamin (MULTIVITAMIN) capsule Take by mouth.   rosuvastatin  (CRESTOR) 40 MG tablet Take 1 tablet by mouth once daily   XARELTO 20 MG TABS tablet Take 1 tablet by mouth once daily   [DISCONTINUED] donepezil (ARICEPT) 10 MG tablet Take 1 tablet (10 mg total) by mouth at bedtime.   donepezil (ARICEPT) 10 MG tablet Take 1 tablet (10 mg total) by mouth at bedtime. Take one tablet daily   No facility-administered encounter medications on file as of 11/09/2021.     Objective:     PHYSICAL EXAMINATION:    VITALS:   Vitals:   11/09/21 0851  BP: 108/71  Pulse: 63  SpO2: 99%  Weight: 230 lb 12.8 oz (104.7 kg)  Height: _0  (1.956 m)    GEN:  The patient appears stated age and is in NAD. HEENT:  Normocephalic, atraumatic.   Neurological examination:  General: NAD, well-groomed, appears stated age. Orientation: The patient is alert. Oriented to person, place and date Cranial nerves: There is good facial symmetry.The speech is fluent and clear. No aphasia or dysarthria. Fund of knowledge is appropriate. Recent and remote memory  are impaired. Attention and concentration are reduced.  Able to name objects and repeat phrases.  Hearing is intact to conversational tone.    Sensation: Sensation is intact to light touch throughout Motor: Strength is at least antigravity x4. Tremors: none  DTR's 2/4 in [place] Cognitive Assessment  04/23/2019 12/14/2018 09/25/2018  Visuospatial/ Executive (0/5) _0 Naming (0/3) _1 Attention: Read list of digits (0/2) _2 Attention: Read list of letters (0/1) 0 1 1  Attention: Serial 7 subtraction starting at 100 (0/3) _3 Language: Repeat phrase (0/2) _4 Language : Fluency (0/1) _5 Abstraction (0/2) _6 Delayed Recall (0/5) 0 0 0  Orientation (0/6) _7 Total _8 Adjusted Score (based on education) 25 25 -   MMSE - Mini Mental State Exam 11/09/2021 09/25/2018  Orientation to time 5 5  Orientation to Place 5 5  Registration 3 3  Attention/ Calculation 2 5  Recall 0 3   Language- name 2 objects 2 2  Language- repeat 1 1  Language- follow 3 step command 1 3  Language- read & follow direction 1 1  Write a sentence 1 1  Copy design 1 1  Total score 22 30    No flowsheet data found.     Movement examination: Tone: There is normal tone in the UE/LE Abnormal movements:  no tremor.  No myoclonus.  No asterixis.   Coordination:  There is no decremation with RAM's. Normal finger to nose  Gait and Station: The patient has no difficulty arising out of a deep-seated chair without the use of the hands. The patient's stride length is good.  Gait is cautious and narrow.        Total time spent on today's visit was 40 minutes, including both face-to-face time and nonface-to-face time. Time included that spent on review of records (prior notes available to me/labs/imaging if pertinent), discussing treatment and goals, answering patient's questions and coordinating care.  Cc:  Marin Olp, MD Sharene Butters, PA-C

## 2021-11-09 NOTE — Patient Instructions (Signed)
Good to see you. Continue Donepezil 10mg  daily.  Follow-up in 6 months after neurocognitive testing , call for any changes   FALL PRECAUTIONS: Be cautious when walking. Scan the area for obstacles that may increase the risk of trips and falls. When getting up in the mornings, sit up at the edge of the bed for a few minutes before getting out of bed. Consider elevating the bed at the head end to avoid drop of blood pressure when getting up. Walk always in a well-lit room (use night lights in the walls). Avoid area rugs or power cords from appliances in the middle of the walkways. Use a walker or a cane if necessary and consider physical therapy for balance exercise. Get your eyesight checked regularly.  FINANCIAL OVERSIGHT: Supervision, especially oversight when making financial decisions or transactions is also recommended as difficulties arise.  HOME SAFETY: Consider the safety of the kitchen when operating appliances like stoves, microwave oven, and blender. Consider having supervision and share cooking responsibilities until no longer able to participate in those. Accidents with firearms and other hazards in the house should be identified and addressed as well.  DRIVING: Regarding driving, in patients with progressive memory problems, driving will be impaired. We advise to have someone else do the driving if trouble finding directions or if minor accidents are reported. Independent driving assessment is available to determine safety of driving.  ABILITY TO BE LEFT ALONE: If patient is unable to contact 911 operator, consider using LifeLine, or when the need is there, arrange for someone to stay with patients. Smoking is a fire hazard, consider supervision or cessation. Risk of wandering should be assessed by caregiver and if detected at any point, supervision and safe proof recommendations should be instituted.  MEDICATION SUPERVISION: Inability to self-administer medication needs to be constantly  addressed. Implement a mechanism to ensure safe administration of the medications.  RECOMMENDATIONS FOR ALL PATIENTS WITH MEMORY PROBLEMS: 1. Continue to exercise (Recommend 30 minutes of walking everyday, or 3 hours every week) 2. Increase social interactions - continue going to [place] and enjoy social gatherings with friends and family 3. Eat healthy, avoid fried foods and eat more fruits and vegetables 4. Maintain adequate blood pressure, blood sugar, and blood cholesterol level. Reducing the risk of stroke and cardiovascular disease also helps promoting better memory. 5. Avoid stressful situations. Live a simple life and avoid aggravations. Organize your time and prepare for the next day in anticipation. 6. Sleep well, avoid any interruptions of sleep and avoid any distractions in the bedroom that may interfere with adequate sleep quality 7. Avoid sugar, avoid sweets as there is a strong link between excessive sugar intake, diabetes, and cognitive impairment We discussed the Mediterranean diet, which has been shown to help patients reduce the risk of progressive memory disorders and reduces cardiovascular risk. This includes eating fish, eat fruits and green leafy vegetables, nuts like almonds and hazelnuts, walnuts, and also use olive oil. Avoid fast foods and fried foods as much as possible. Avoid sweets and sugar as sugar use has been linked to worsening of memory function.      Mediterranean Diet  Why follow it? Research shows Those who follow the Mediterranean diet have a reduced risk of heart disease  The diet is associated with a reduced incidence of Parkinson's and Alzheimer's diseases People following the diet may have longer life expectancies and lower rates of chronic diseases  The Dietary Guidelines for Americans recommends the Mediterranean diet as an eating plan  to promote health and prevent disease  What Is the Mediterranean Diet?  Healthy eating plan based on typical foods  and recipes of Mediterranean-style cooking The diet is primarily a plant based diet; these foods should make up a majority of meals   Starches - Plant based foods should make up a majority of meals - They are an important sources of vitamins, minerals, energy, antioxidants, and fiber - Choose whole grains, foods high in fiber and minimally processed items  - Typical grain sources include wheat, oats, barley, corn, brown rice, bulgar, farro, millet, polenta, couscous  - Various types of beans include chickpeas, lentils, fava beans, black beans, white beans   Fruits  Veggies - Large quantities of antioxidant rich fruits & veggies; 6 or more servings  - Vegetables can be eaten raw or lightly drizzled with oil and cooked  - Vegetables common to the traditional Mediterranean Diet include: artichokes, arugula, beets, broccoli, brussel sprouts, cabbage, carrots, celery, collard greens, cucumbers, eggplant, kale, leeks, lemons, lettuce, mushrooms, okra, onions, peas, peppers, potatoes, pumpkin, radishes, rutabaga, shallots, spinach, sweet potatoes, turnips, zucchini - Fruits common to the Mediterranean Diet include: apples, apricots, avocados, cherries, clementines, dates, figs, grapefruits, grapes, melons, nectarines, oranges, peaches, pears, pomegranates, strawberries, tangerines  Fats - Replace butter and margarine with healthy oils, such as olive oil, canola oil, and tahini  - Limit nuts to no more than a handful a day  - Nuts include walnuts, almonds, pecans, pistachios, pine nuts  - Limit or avoid candied, honey roasted or heavily salted nuts - Olives are central to the [employer] - can be eaten whole or used in a variety of dishes   Meats Protein - Limiting red meat: no more than a few times a month - When eating red meat: choose lean cuts and keep the portion to the size of deck of cards - Eggs: approx. 0 to 4 times a week  - Fish and lean poultry: at least 2 a week  - Healthy protein  sources include, chicken, [place], lean beef, lamb - Increase intake of seafood such as tuna, salmon, trout, mackerel, shrimp, scallops - Avoid or limit high fat processed meats such as sausage and bacon  Dairy - Include moderate amounts of low fat dairy products  - Focus on healthy dairy such as fat free yogurt, skim milk, low or reduced fat cheese - Limit dairy products higher in fat such as whole or 2% milk, cheese, ice cream  Alcohol - Moderate amounts of red wine is ok  - No more than 5 oz daily for women (all ages) and men older than age 96  - No more than 10 oz of wine daily for men younger than 7  Other - Limit sweets and other desserts  - Use herbs and spices instead of salt to flavor foods  - Herbs and spices common to the traditional Mediterranean Diet include: basil, bay leaves, chives, cloves, cumin, fennel, garlic, lavender, marjoram, mint, oregano, parsley, pepper, rosemary, sage, savory, sumac, tarragon, thyme   Its not just a diet, its a lifestyle:  The Mediterranean diet includes lifestyle factors typical of those in the region  Foods, drinks and meals are best eaten with others and savored Daily physical activity is important for overall good health This could be strenuous exercise like running and aerobics This could also be more leisurely activities such as walking, housework, yard-work, or taking the stairs Moderation is the key; a balanced and healthy diet accommodates most foods and  drinks Consider portion sizes and frequency of consumption of certain foods   Meal Ideas & Options:  Breakfast:  Whole wheat toast or whole wheat English muffins with peanut butter & hard boiled egg Steel cut oats topped with apples & cinnamon and skim milk  Fresh fruit: banana, strawberries, melon, berries, peaches  Smoothies: strawberries, bananas, greek yogurt, peanut butter Low fat greek yogurt with blueberries and granola  Egg white omelet with spinach and mushrooms Breakfast  couscous: whole wheat couscous, apricots, skim milk, cranberries  Sandwiches:  Hummus and grilled vegetables (peppers, zucchini, squash) on whole wheat bread   Grilled chicken on whole wheat pita with lettuce, tomatoes, cucumbers or tzatziki  [place] salad on whole wheat bread: tuna salad made with greek yogurt, olives, red peppers, capers, green onions Garlic rosemary lamb pita: lamb sauted with garlic, rosemary, salt & pepper; add lettuce, cucumber, greek yogurt to pita - flavor with lemon juice and black pepper  Seafood:  Mediterranean grilled salmon, seasoned with garlic, basil, parsley, lemon juice and black pepper Shrimp, lemon, and spinach whole-grain pasta salad made with low fat greek yogurt  Seared scallops with lemon orzo  Seared tuna steaks seasoned salt, pepper, coriander topped with tomato mixture of olives, tomatoes, olive oil, minced garlic, parsley, green onions and cappers  Meats:  Herbed greek chicken salad with kalamata olives, cucumber, feta  Red bell peppers stuffed with spinach, bulgur, lean ground beef (or lentils) & topped with feta   Kebabs: skewers of chicken, tomatoes, onions, zucchini, squash  [place] burgers: made with red onions, mint, dill, lemon juice, feta cheese topped with roasted red peppers Vegetarian Cucumber salad: cucumbers, artichoke hearts, celery, red onion, feta cheese, tossed in olive oil & lemon juice  Hummus and whole grain pita points with a greek salad (lettuce, tomato, feta, olives, cucumbers, red onion) Lentil soup with celery, carrots made with vegetable broth, garlic, salt and pepper  Tabouli salad: parsley, bulgur, mint, scallions, cucumbers, tomato, radishes, lemon juice, olive oil, salt and pepper.

## 2021-11-16 ENCOUNTER — Other Ambulatory Visit: Payer: Self-pay

## 2021-11-16 ENCOUNTER — Ambulatory Visit (INDEPENDENT_AMBULATORY_CARE_PROVIDER_SITE_OTHER): Payer: BC Managed Care – PPO

## 2021-11-16 VITALS — BP 120/68 | HR 79 | Temp 98.7°F | Wt 224.6 lb

## 2021-11-16 DIAGNOSIS — Z0000 Encounter for general adult medical examination without abnormal findings: Secondary | ICD-10-CM | POA: Diagnosis not present

## 2021-11-16 NOTE — Patient Instructions (Addendum)
[name] , Thank you for taking time to come for your Medicare Wellness Visit. I appreciate your ongoing commitment to your health goals. Please review the following plan we discussed and let me know if I can assist you in the future.   Screening recommendations/referrals: Colonoscopy: Done 09/24/18 repeat every 3 years  Recommended yearly ophthalmology/optometry visit for glaucoma screening and checkup Recommended yearly dental visit for hygiene and checkup  Vaccinations: Influenza vaccine: Done 08/01/21 repeat every year  Pneumococcal vaccine: Up to date Tdap vaccine: Done 01/03/12 repeat in 01/02/22 every 10 years Shingles vaccine: Completed 9/26, 09/23/19   Covid-19: Completed 1/20, 2/10, 07/22/20 & 03/03/21  Advanced directives: Copies in chart   Conditions/risks identified: Continue improving memory   Next appointment: Follow up in one year for your annual wellness visit.   Preventive Care 70 Years and Older, Male Preventive care refers to lifestyle choices and visits with your health care provider that can promote health and wellness. What does preventive care include? A yearly physical exam. This is also called an annual well check. Dental exams once or twice a year. Routine eye exams. Ask your health care provider how often you should have your eyes checked. Personal lifestyle choices, including: Daily care of your teeth and gums. Regular physical activity. Eating a healthy diet. Avoiding tobacco and drug use. Limiting alcohol use. Practicing safe sex. Taking low doses of aspirin every day. Taking vitamin and mineral supplements as recommended by your health care provider. What happens during an annual well check? The services and screenings done by your health care provider during your annual well check will depend on your age, overall health, lifestyle risk factors, and family history of disease. Counseling  Your health care provider may ask you questions about  your: Alcohol use. Tobacco use. Drug use. Emotional well-being. Home and relationship well-being. Sexual activity. Eating habits. History of falls. Memory and ability to understand (cognition). Work and work Statistician. Screening  You may have the following tests or measurements: Height, weight, and BMI. Blood pressure. Lipid and cholesterol levels. These may be checked every 5 years, or more frequently if you are over 70 years old. Skin check. Lung cancer screening. You may have this screening every year starting at age 70 if you have a 30-pack-year history of smoking and currently smoke or have quit within the past 15 years. Fecal occult blood test (FOBT) of the stool. You may have this test every year starting at age 70. Flexible sigmoidoscopy or colonoscopy. You may have a sigmoidoscopy every 5 years or a colonoscopy every 10 years starting at age 23. Prostate cancer screening. Recommendations will vary depending on your family history and other risks. Hepatitis C blood test. Hepatitis B blood test. Sexually transmitted disease (STD) testing. Diabetes screening. This is done by checking your blood sugar (glucose) after you have not eaten for a while (fasting). You may have this done every 1-3 years. Abdominal aortic aneurysm (AAA) screening. You may need this if you are a current or former smoker. Osteoporosis. You may be screened starting at age 70 if you are at high risk. Talk with your health care provider about your test results, treatment options, and if necessary, the need for more tests. Vaccines  Your health care provider may recommend certain vaccines, such as: Influenza vaccine. This is recommended every year. Tetanus, diphtheria, and acellular pertussis (Tdap, Td) vaccine. You may need a Td booster every 10 years. Zoster vaccine. You may need this after age 70. Pneumococcal 13-valent  conjugate (PCV13) vaccine. One dose is recommended after age 70. Pneumococcal  polysaccharide (PPSV23) vaccine. One dose is recommended after age 70. Talk to your health care provider about which screenings and vaccines you need and how often you need them. This information is not intended to replace advice given to you by your health care provider. Make sure you discuss any questions you have with your health care provider. Document Released: 10/27/2015 Document Revised: 06/19/2016 Document Reviewed: 08/01/2015 Elsevier Interactive Patient Education  2017 [place] Prevention in the Home Falls can cause injuries. They can happen to people of all ages. There are many things you can do to make your home safe and to help prevent falls. What can I do on the outside of my home? Regularly fix the edges of walkways and driveways and fix any cracks. Remove anything that might make you trip as you walk through a door, such as a raised step or threshold. Trim any bushes or trees on the path to your home. Use bright outdoor lighting. Clear any walking paths of anything that might make someone trip, such as rocks or tools. Regularly check to see if handrails are loose or broken. Make sure that both sides of any steps have handrails. Any raised decks and porches should have guardrails on the edges. Have any leaves, snow, or ice cleared regularly. Use sand or salt on walking paths during winter. Clean up any spills in your garage right away. This includes oil or grease spills. What can I do in the bathroom? Use night lights. Install grab bars by the toilet and in the tub and shower. Do not use towel bars as grab bars. Use non-skid mats or decals in the tub or shower. If you need to sit down in the shower, use a plastic, non-slip stool. Keep the floor dry. Clean up any water that spills on the floor as soon as it happens. Remove soap buildup in the tub or shower regularly. Attach bath mats securely with double-sided non-slip rug tape. Do not have throw rugs and other  things on the floor that can make you trip. What can I do in the bedroom? Use night lights. Make sure that you have a light by your bed that is easy to reach. Do not use any sheets or blankets that are too big for your bed. They should not hang down onto the floor. Have a firm chair that has side arms. You can use this for support while you get dressed. Do not have throw rugs and other things on the floor that can make you trip. What can I do in the kitchen? Clean up any spills right away. Avoid walking on wet floors. Keep items that you use a lot in easy-to-reach places. If you need to reach something above you, use a strong step stool that has a grab bar. Keep electrical cords out of the way. Do not use floor polish or wax that makes floors slippery. If you must use wax, use non-skid floor wax. Do not have throw rugs and other things on the floor that can make you trip. What can I do with my stairs? Do not leave any items on the stairs. Make sure that there are handrails on both sides of the stairs and use them. Fix handrails that are broken or loose. Make sure that handrails are as long as the stairways. Check any carpeting to make sure that it is firmly attached to the stairs. Fix any carpet that  is loose or worn. Avoid having throw rugs at the top or bottom of the stairs. If you do have throw rugs, attach them to the floor with carpet tape. Make sure that you have a light switch at the top of the stairs and the bottom of the stairs. If you do not have them, ask someone to add them for you. What else can I do to help prevent falls? Wear shoes that: Do not have high heels. Have rubber bottoms. Are comfortable and fit you well. Are closed at the toe. Do not wear sandals. If you use a stepladder: Make sure that it is fully opened. Do not climb a closed stepladder. Make sure that both sides of the stepladder are locked into place. Ask someone to hold it for you, if possible. Clearly  mark and make sure that you can see: Any grab bars or handrails. First and last steps. Where the edge of each step is. Use tools that help you move around (mobility aids) if they are needed. These include: Canes. Walkers. Scooters. Crutches. Turn on the lights when you go into a dark area. Replace any light bulbs as soon as they burn out. Set up your furniture so you have a clear path. Avoid moving your furniture around. If any of your floors are uneven, fix them. If there are any pets around you, be aware of where they are. Review your medicines with your doctor. Some medicines can make you feel dizzy. This can increase your chance of falling. Ask your doctor what other things that you can do to help prevent falls. This information is not intended to replace advice given to you by your health care provider. Make sure you discuss any questions you have with your health care provider. Document Released: 07/27/2009 Document Revised: 03/07/2016 Document Reviewed: 11/04/2014 Elsevier Interactive Patient Education  2017 [employer].

## 2021-11-16 NOTE — Progress Notes (Addendum)
Subjective:   Rodney Scott is a 71 y.o. male who presents for Medicare Annual/Subsequent preventive examination.  Review of Systems     Cardiac Risk Factors include: advanced age (>78men, >29 women);male gender;hypertension;dyslipidemia;smoking/ tobacco exposure     Objective:    Today's Vitals   11/16/21 0847  BP: 120/68  Pulse: 79  Temp: 98.7 F (37.1 C)  SpO2: 99%  Weight: 224 lb 9.6 oz (101.9 kg)   Body mass index is 26.63 kg/m.  Advanced Directives 11/16/2021 11/09/2021 05/09/2021 12/28/2020 11/10/2020 08/28/2020 11/26/2019  Does Patient Have a Medical Advance Directive? Yes No Yes Yes No Yes No  Type of Advance Directive [place];Living will -  Copy of [address] in Chart? Yes - validated most recent copy scanned in chart (See row information) - - - - - -  Would patient like information on creating a medical advance directive? - - - - No - Patient declined - -    Current Medications (verified) Outpatient Encounter Medications as of 11/16/2021  Medication Sig   benazepril (LOTENSIN) 20 MG tablet Take 1 tablet by mouth once daily   desvenlafaxine (PRISTIQ) 50 MG 24 hr tablet Take 1 tablet by mouth once daily   donepezil (ARICEPT) 10 MG tablet Take 1 tablet (10 mg total) by mouth at bedtime. Take one tablet daily   Loratadine 10 MG CAPS Take by mouth daily.   Multiple Vitamin (MULTIVITAMIN) capsule Take by mouth.   rosuvastatin (CRESTOR) 40 MG tablet Take 1 tablet by mouth once daily   XARELTO 20 MG TABS tablet Take 1 tablet by mouth once daily   No facility-administered encounter medications on file as of 11/16/2021.    Allergies (verified) Yellow jacket venom [bee venom]   History: Past Medical History:  Diagnosis Date   Allergic rhinitis 07/31/2007   Loratadine at times- but mild mental fog on this    BPH associated with nocturia 03/10/2018   Clotting disorder    Essential  hypertension, benign 07/26/2014   Benazepril 20mg     Factor V Leiden 07/31/2007   Xarelto 20mg  daily. 2 DVT 2001, PE 2011.     History of colonic polyps 07/31/2007   02/2015 adenoma. 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 Patient states thought was external.      Left inguinal hernia 12/05/2016   Declines surgery referral- minimally bothering him. Can call in if he changes his mind   Major depressive disorder in full remission 03/24/2009   Wellbutrin xl 300mg -->150mg --> off 03/2020  pristiq 50mg .   In past-->remeron 15mg  mainly takes prn for sleep zoloft --> pristiq per sister who is psychiatrist apparetnly   MCI (mild cognitive impairment) 06/15/2015   Osteoarthritis    Right rotator cuff tear 12/07/2010   S/p surgery.     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 COmpression stocking on left. L >R swelling-history of 1st DVT in L.      Past Surgical History:  Procedure Laterality Date   right rotator cuff repair     SALIVARY STONE REMOVAL     Family History  Problem Relation Age of Onset   Memory  loss Mother    Colon cancer Father 32   Factor V Leiden deficiency Son    Esophageal cancer Neg Hx    Rectal cancer Neg Hx    Stomach cancer Neg Hx    Social History   Socioeconomic History   Marital status: Married    Spouse name: Not on file   Number of children: 2   Years of education: 12   Highest education level: High school graduate  Occupational History   Occupation: Maintenance  Tobacco Use   Smoking status: Every Day    Packs/day: 1.00    Years: 45.00    Pack years: 45.00    Types: Cigarettes   Smokeless tobacco: Never   Tobacco comments:    Pt stated  about 15 a day   Vaping Use    Vaping Use: Never used  Substance and Sexual Activity   Alcohol use: Yes    Comment: rare alcohol consumption   Drug use: No   Sexual activity: Yes  Other Topics Concern   Not on file  Social History Narrative   Married (wife patient of Dr. Yong Channel). Son and daughter. 1 granddaughter.       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: time around house, time with family, yardwork, some [place] in 2 story home with wife and granddaughter, [employer] geologist school graduate      Right handed    Social Determinants of Health   Financial Resource Strain: Low Risk    Difficulty of Paying Living Expenses: Not hard at all  Food Insecurity: No Food Insecurity   Worried About Charity fundraiser in the Last Year: Never true   Arboriculturist in the Last Year: Never true  Transportation Needs: No Transportation Needs   Lack of Transportation (Medical): No   Lack of Transportation (Non-Medical): No  Physical Activity: Inactive   Days of Exercise per Week: 0 days   Minutes of Exercise per Session: 0 min  Stress: No Stress Concern Present   Feeling of Stress : Not at all  Social Connections: Moderately Isolated   Frequency of Communication with Friends and Family: Twice a week   Frequency of Social Gatherings with Friends and Family: More than three times a week   Attends Religious Services: Never   Marine scientist or Organizations: No   Attends Music therapist: Never   Marital Status: Married    Tobacco Counseling Ready to quit: Not Answered Counseling given: Not Answered Tobacco comments: Pt stated  about 15 a day    Clinical Intake:  Pre-visit preparation completed: Yes  Pain : No/denies pain     BMI - recorded: 26.63 Nutritional Status: BMI 25 -29 Overweight Nutritional Risks: None Diabetes: No  How often do you need to have someone help  you when you read instructions, pamphlets, or other written materials from your doctor or pharmacy?: 1 - Never  Diabetic?no  Interpreter Needed?: No  Information entered by :: Charlott Rakes, LPN   Activities of Daily Living In your present state of health, do you have any difficulty performing the following activities: 11/16/2021  Hearing? Y  Comment hearing aids  Vision? N  Difficulty concentrating or making decisions? Y  Walking or climbing stairs? N  Dressing or bathing? N  Doing errands, shopping? N  Preparing Food  and eating ? N  Using the Toilet? N  In the past six months, have you accidently leaked urine? N  Do you have problems with loss of bowel control? N  Managing your Medications? N  Managing your Finances? N  Housekeeping or managing your Housekeeping? N  Some recent data might be hidden    Patient Care Team: Marin Olp, MD as PCP - General (Family Medicine) Cameron Sprang, MD as Consulting Physician (Neurology) Madelin Rear, [facility] as Pharmacist (Pharmacist) Elease Hashimoto (Neurology)  Indicate any recent Medical Services you may have received from other than [facility] providers in the past year (date may be approximate).     Assessment:   This is a routine wellness examination for [place].  Hearing/Vision screen Hearing Screening - Comments:: Pt wears hearing aids  Vision Screening - Comments:: Encouraged to follow up with eye exams   Dietary issues and exercise activities discussed: Current Exercise Habits: The patient has a physically strenuous job, but has no regular exercise apart from work.   Goals Addressed             This Visit's Progress    Patient Stated       Continue improving memory        Depression Screen PHQ 2/9 Scores 11/16/2021 04/09/2021 11/10/2020 09/22/2020 03/15/2020 09/28/2019 09/13/2019  PHQ - 2 Score 0 0 0 0 0 0 1  PHQ- 9 Score - 0 - 1 1 - 1    Fall Risk Fall Risk  11/16/2021 11/09/2021 05/09/2021 04/09/2021 12/28/2020   Falls in the past year? 0 0 0 0 0  Comment - - - - -  Number falls in past yr: 0 0 0 0 0  Injury with Fall? 0 0 0 0 0  Risk for fall due to : Impaired vision - - No Fall Risks -  Follow up Falls prevention discussed - - Falls evaluation completed -    FALL RISK PREVENTION PERTAINING TO THE HOME:  Any stairs in or around the home? Yes  If so, are there any without handrails? No  Home free of loose throw rugs in walkways, pet beds, electrical cords, etc? Yes  Adequate lighting in your home to reduce risk of falls? Yes   ASSISTIVE DEVICES UTILIZED TO PREVENT FALLS:  Life alert? No  Use of a cane, walker or w/c? No  Grab bars in the bathroom? No  Shower chair or bench in shower? No  Elevated toilet seat or a handicapped toilet? No   TIMED UP AND GO:  Was the test performed? Yes .  Length of time to ambulate 10 feet: 10 sec.   Gait steady and fast without use of assistive device  Cognitive Function: MMSE - Mini Mental State Exam 11/09/2021 09/25/2018  Orientation to time 5 5  Orientation to Place 5 5  Registration 3 3  Attention/ Calculation 2 5  Recall 0 3  Language- name 2 objects 2 2  Language- repeat 1 1  Language- follow 3 step command 1 3  Language- read & follow direction 1 1  Write a sentence 1 1  Copy design 1 1  Total score 22 30   Montreal Cognitive Assessment  04/23/2019 12/14/2018 09/25/2018  Visuospatial/ Executive (0/5) 5 4 5   Naming (0/3) 3 3 3   Attention: Read list of digits (0/2) 2 2 2   Attention: Read list of letters (0/1) 0 1 1  Attention: Serial 7 subtraction starting at 100 (0/3) 3 3 3  Language: Repeat phrase (0/2) 2 2 2   Language : Fluency (0/1) 1 1 1   Abstraction (0/2) 2 2 2   Delayed Recall (0/5) 0 0 0  Orientation (0/6) 6 6 6   Total 24 24 25   Adjusted Score (based on education) 25 25 -   6CIT Screen 11/16/2021 11/10/2020  What Year? 0 points 0 points  What month? 0 points 0 points  What time? 0 points -  Count back from 20 0 points 0  points  Months in reverse 0 points 0 points  Repeat phrase 2 points 0 points  Total Score 2 -    Immunizations Immunization History  Administered Date(s) Administered   Fluad Quad(high Dose 65+) 07/22/2020, 07/23/2021, 08/01/2021   Influenza Split 07/26/2011, 08/07/2012   Influenza Whole 07/31/2007, 07/15/2008, 07/07/2009, 06/29/2010   Influenza, High Dose Seasonal PF 09/08/2017, 07/15/2018, 07/10/2019   Influenza,inj,Quad PF,6+ Mos 08/20/2013, 07/26/2014, 06/15/2015, 07/05/2016   Influenza-Unspecified 07/10/2019   PFIZER(Purple Top)SARS-COV-2 Vaccination 11/03/2019, 11/24/2019, 07/22/2020, 03/03/2021   PNEUMOCOCCAL CONJUGATE-20 03/03/2021   Pneumococcal Conjugate-13 06/05/2017   Pneumococcal Polysaccharide-23 06/22/2010, 07/15/2018   Td 10/14/2001   Tdap 01/03/2012   Zoster Recombinat (Shingrix) 07/10/2019, 09/29/2019    TDAP status: Up to date  Flu Vaccine status: Up to date  Pneumococcal vaccine status: Up to date  Covid-19 vaccine status: Completed vaccines  Qualifies for Shingles Vaccine? Yes   Zostavax completed Yes   Shingrix Completed?: Yes  Screening Tests Health Maintenance  Topic Date Due   COVID-19 Vaccine (5 - Booster for Pfizer series) 04/28/2021   COLONOSCOPY (Pts 45-10yrs Insurance coverage will need to be confirmed)  09/24/2021   Hepatitis C Screening  11/11/2109 (Originally 01/02/1969)   TETANUS/TDAP  01/02/2022   Pneumonia Vaccine 15+ Years old  Completed   INFLUENZA VACCINE  Completed   Zoster Vaccines- Shingrix  Completed   HPV VACCINES  Aged Out    Health Maintenance  Health Maintenance Due  Topic Date Due   COVID-19 Vaccine (5 - Booster for [place] series) 04/28/2021   COLONOSCOPY (Pts 45-61yrs Insurance coverage will need to be confirmed)  09/24/2021    Colorectal cancer screening: Type of screening: Colonoscopy. Completed 09/24/18. Repeat every 3 years   Additional Screening:  Hepatitis C Screening: does qualify;  Vision  Screening: Recommended annual ophthalmology exams for early detection of glaucoma and other disorders of the eye. Is the patient up to date with their annual eye exam?  No  Who is the provider or what is the name of the office in which the patient attends annual eye exams? Encouraged to follow up with eye exams  If pt is not established with a provider, would they like to be referred to a provider to establish care? No .   Dental Screening: Recommended annual dental exams for proper oral hygiene  Community Resource Referral / Chronic Care Management: CRR required this visit?  No   CCM required this visit?  No      Plan:     I have personally reviewed and noted the following in the patients chart:   Medical and social history Use of alcohol, tobacco or illicit drugs  Current medications and supplements including opioid prescriptions. Patient is not currently taking opioid prescriptions. Functional ability and status Nutritional status Physical activity Advanced directives List of other physicians Hospitalizations, surgeries, and ER visits in previous 12 months Vitals Screenings to include cognitive, depression, and falls Referrals and appointments  In addition, I have reviewed and discussed with patient certain preventive protocols, quality  metrics, and best practice recommendations. A written personalized care plan for preventive services as well as general preventive health recommendations were provided to patient.     Willette Brace, LPN   11/15/1171   Nurse Notes: None

## 2021-11-17 ENCOUNTER — Other Ambulatory Visit: Payer: Self-pay | Admitting: Family Medicine

## 2021-11-18 ENCOUNTER — Other Ambulatory Visit: Payer: Self-pay | Admitting: Family Medicine

## 2021-11-26 NOTE — Progress Notes (Signed)
Phone [phone] In person visit   Subjective:   Rodney Scott is a 71 y.o. year old very pleasant male patient who presents for/with See problem oriented charting Chief Complaint  Patient presents with   Follow-up   Hypertension   Hyperlipidemia   Depression   memory issues    Pt c/o not being able to remember things.   This visit occurred during the SARS-CoV-2 public health emergency.  Safety protocols were in place, including screening questions prior to the visit, additional usage of staff PPE, and extensive cleaning of exam room while observing appropriate contact time as indicated for disinfecting solutions.   Past Medical History-  Patient Active Problem List   Diagnosis Date Noted   MCI (mild cognitive impairment) 06/15/2015    Priority: High   Current smoker 07/26/2014    Priority: High   Factor V Leiden ([place]) 07/31/2007    Priority: High   History of venous thrombosis and embolism 07/31/2007    Priority: High   BPH associated with nocturia 03/10/2018    Priority: Medium    Hyperglycemia 12/05/2016    Priority: Medium    Essential hypertension, benign 07/26/2014    Priority: Medium    Major depressive disorder in full remission 03/24/2009    Priority: Medium    Hyperlipidemia 07/31/2007    Priority: Medium    Senile purpura 09/22/2020    Priority: Low   Left inguinal hernia 12/05/2016    Priority: Low   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 Take one  tablet daily 90 tablet 3   Loratadine 10 MG CAPS Take by mouth daily.     Multiple Vitamin (MULTIVITAMIN) capsule Take by mouth.     rosuvastatin (CRESTOR) 40 MG tablet Take 1 tablet by mouth once daily 90 tablet 0   XARELTO 20 MG TABS tablet Take 1 tablet by mouth once daily 90 tablet 0   desvenlafaxine (PRISTIQ) 50 MG 24 hr tablet Take 1 tablet (50 mg total) by mouth daily. 90 tablet 3   No current facility-administered medications for this visit.     Objective:  BP 126/88    Pulse (!) 57    Temp 97.9 F (36.6 C)    Ht 6\' 5"  (1.956 m)    Wt 222 lb (100.7 kg)    SpO2 98%    BMI 26.33 kg/m  Gen: NAD, resting comfortably CV: RRR no murmurs rubs or gallops. HR low 60s on my exam Lungs: CTAB no crackles, wheeze, rhonchi Ext: no edema Skin: warm, dry Neuro: short term memory issues noted- has good long term recall as to be expected    Assessment and Plan   #Mild cognitive impairment-was started on Aricept 10 mg by Dr. Delice Lesch. He is doing well with this medicinebut continues to be bothered by intermittent memory issues-we discussed this is to be expected despite the medicine-  -Last visit with neurology was on 11/09/2021 with no recent changes.  Other than mild decrease in MMSE. Continue current meds with stable state.   #smoking- 15 a day or so. AAA screen sept 2018. UA at next visit- at least yearly. . Declines lung cancer screening again.   #facial pain from February 2022 visit/potential trigeminal neuralgia- was given gabapentin originally but at last visit symptoms had improved and was not on medication-today reports no more issues    #hyperlipidemia S: Medication:rosuvastatin 40 mg  Lab Results  Component Value Date   CHOL 163 04/09/2021   HDL 54.60 04/09/2021   LDLCALC 79 04/09/2021   LDLDIRECT 86.0 09/13/2019   TRIG 148.0 04/09/2021   CHOLHDL 3 04/09/2021   A/P: Reasonable control for primary prevention-continue current medication-update lipid panel next visit   #  Depression S: Medication:pristiq 50 mg  Depression screen [facility] 2/9 12/05/2021 11/16/2021 04/09/2021  Decreased Interest 0 0 0  Down, Depressed, Hopeless 0 0 0  PHQ - 2 Score 0 0 0  Altered sleeping 0 - 0  Tired, decreased energy 0 - 0  Change in appetite 0 - 0  Feeling bad or failure about yourself  1 - 0  Trouble concentrating 1 - 0  Moving slowly or fidgety/restless 0 - 0  Suicidal thoughts 0 - 0  PHQ-9 Score 2 - 0  Difficult doing work/chores Not difficult at all - Not difficult at all  Some recent data might be hidden  A/P: In full remission-continue current medicine-issues noted are related to mild cognitive impairment  # Hyperglycemia/insulin resistance/prediabetes S:  Medication: none Exercise and diet- overall stable on weight . Active with work- encouraged regular exercise but he does wants to hold off. Tries to eat reasonably healthy Lab Results  Component Value Date   HGBA1C 6.0 04/09/2021   HGBA1C 5.6 09/22/2020   HGBA1C 5.9 03/15/2020   A/P: At risk for diabetes previously noted-due for repeat A1c-update with labs today. Work on lifestyle healthy eating/regular exercise/weight loss  #hypertension S: medication: benazepril 20 mg Home readings #s: does not usually check BP Readings from Last 3 Encounters:  12/05/21 126/88  11/16/21 120/68  11/09/21 108/71  A/P: Reasonable control on repeat (was drinking coffee in lobby as well)-continue current medication   #factor V leiden- patient with history of DVT and PE.  No shortness of breath or leg swelling or calf pain reported -Stable on chronic Xarelto-continue current medications.  #colonoscopy overdue- encouraged him to schedule also entered referral  Recommended follow up: No follow-ups on file. Future Appointments  Date Time Provider [place]  05/23/2022  8:30 AM Hazle Coca, PhD [facility] None  05/23/2022  9:30 AM [facility]SY[facility] TECH [facility] None  06/03/2022  2:30 PM Hazle Coca, PhD [facility] None   06/07/2022  9:00 AM Rondel Jumbo, PA-C [facility] None  11/29/2022  8:45 AM [facility] HEALTH COACH [facility]    Lab/Order associations:   ICD-10-CM   1. Hyperlipidemia, unspecified hyperlipidemia type  E78.5 CBC with Differential/Platelet    Comprehensive metabolic panel    2. Essential hypertension, benign  I10 CBC with Differential/Platelet    Comprehensive metabolic panel    3. Hyperglycemia  R73.9 Hemoglobin A1c    4. Depression, major, recurrent, in complete remission ([place])  F33.42     5. Factor V Leiden ([place]) Chronic D68.51     6. MCI (mild cognitive impairment)  G31.84     7. History of colonic polyps  Z86.010 Ambulatory referral to Gastroenterology      Meds ordered this encounter  Medications  desvenlafaxine (PRISTIQ) 50 MG 24 hr tablet    Sig: Take 1 tablet (50 mg total) by mouth daily.    Dispense:  90 tablet    Refill:  3   I,Jada Bradford,acting as a scribe for [name], MD.,have documented all relevant documentation on the behalf of [name], MD,as directed by  [name], MD while in the presence of [name], MD.  I, [name], MD, have reviewed all documentation for this visit. The documentation on 12/05/21 for the exam, diagnosis, procedures, and orders are all accurate and complete.   Return precautions advised.  [name], MD

## 2021-12-05 ENCOUNTER — Encounter: Payer: Self-pay | Admitting: Family Medicine

## 2021-12-05 ENCOUNTER — Ambulatory Visit (INDEPENDENT_AMBULATORY_CARE_PROVIDER_SITE_OTHER): Payer: BC Managed Care – PPO | Admitting: Family Medicine

## 2021-12-05 ENCOUNTER — Other Ambulatory Visit: Payer: Self-pay

## 2021-12-05 VITALS — BP 126/88 | HR 57 | Temp 97.9°F | Ht 77.0 in | Wt 222.0 lb

## 2021-12-05 DIAGNOSIS — R739 Hyperglycemia, unspecified: Secondary | ICD-10-CM | POA: Diagnosis not present

## 2021-12-05 DIAGNOSIS — E785 Hyperlipidemia, unspecified: Secondary | ICD-10-CM | POA: Diagnosis not present

## 2021-12-05 DIAGNOSIS — Z86010 Personal history of colonic polyps: Secondary | ICD-10-CM

## 2021-12-05 DIAGNOSIS — I10 Essential (primary) hypertension: Secondary | ICD-10-CM

## 2021-12-05 DIAGNOSIS — F3342 Major depressive disorder, recurrent, in full remission: Secondary | ICD-10-CM | POA: Diagnosis not present

## 2021-12-05 DIAGNOSIS — D6851 Activated protein C resistance: Secondary | ICD-10-CM | POA: Diagnosis not present

## 2021-12-05 DIAGNOSIS — G3184 Mild cognitive impairment, so stated: Secondary | ICD-10-CM | POA: Diagnosis not present

## 2021-12-05 LAB — COMPREHENSIVE METABOLIC PANEL
ALT: 20 U/L (ref 0–53)
AST: 20 U/L (ref 0–37)
Albumin: 3.9 g/dL (ref 3.5–5.2)
Alkaline Phosphatase: 48 U/L (ref 39–117)
BUN: 23 mg/dL (ref 6–23)
CO2: 31 mEq/L (ref 19–32)
Calcium: 9.8 mg/dL (ref 8.4–10.5)
Chloride: 107 mEq/L (ref 96–112)
Creatinine, Ser: 0.91 mg/dL (ref 0.40–1.50)
GFR: 85.75 mL/min (ref >60.00)
Glucose, Bld: 88 mg/dL (ref 70–99)
Potassium: 4.6 mEq/L (ref 3.5–5.1)
Sodium: 141 mEq/L (ref 135–145)
Total Bilirubin: 0.4 mg/dL (ref 0.2–1.2)
Total Protein: 6.3 g/dL (ref 6.0–8.3)

## 2021-12-05 LAB — CBC WITH DIFFERENTIAL/PLATELET
Basophils Absolute: 0.1 10*3/uL (ref 0.0–0.1)
Basophils Relative: 0.7 % (ref 0.0–3.0)
Eosinophils Absolute: 0.2 10*3/uL (ref 0.0–0.7)
Eosinophils Relative: 2.2 % (ref 0.0–5.0)
HCT: 43.4 % (ref 39.0–52.0)
Hemoglobin: 14.4 g/dL (ref 13.0–17.0)
Lymphocytes Relative: 26.7 % (ref 12.0–46.0)
Lymphs Abs: 2.4 10*3/uL (ref 0.7–4.0)
MCHC: 33.2 g/dL (ref 30.0–36.0)
MCV: 96.6 fl (ref 78.0–100.0)
Monocytes Absolute: 0.8 10*3/uL (ref 0.1–1.0)
Monocytes Relative: 9.2 % (ref 3.0–12.0)
Neutro Abs: 5.4 10*3/uL (ref 1.4–7.7)
Neutrophils Relative %: 61.2 % (ref 43.0–77.0)
Platelets: 210 10*3/uL (ref 150.0–400.0)
RBC: 4.49 Mil/uL (ref 4.22–5.81)
RDW: 13.4 % (ref 11.5–15.5)
WBC: 8.8 10*3/uL (ref 4.0–10.5)

## 2021-12-05 LAB — HEMOGLOBIN A1C: Hgb A1c MFr Bld: 6 % (ref 4.6–6.5)

## 2021-12-05 MED ORDER — DESVENLAFAXINE SUCCINATE ER 50 MG PO TB24: 50.0000 mg | ORAL_TABLET | Freq: Every day | ORAL | 3 refills | Status: DC

## 2021-12-05 NOTE — Patient Instructions (Addendum)
Call Dr. Fuller Plan to schedule your repeat colonoscopy [phone].  Trial nicotine patch 14 mg over the counter- wear one a day and that helps substitute for the cigarettes  Please stop by lab before you go If you have mychart- we will send your results within 3 business days of [place] receiving them.  If you do not have mychart- we will call you about results within 5 business days of [place] receiving them.  *please also note that you will see labs on mychart as soon as they post. I will later go in and write notes on them- will say "notes from Dr. Yong Channel" j  Recommended follow up: Return in about 6 months (around 06/04/2022) for physical or sooner if needed.

## 2022-02-17 ENCOUNTER — Other Ambulatory Visit: Payer: Self-pay | Admitting: Family Medicine

## 2022-02-17 ENCOUNTER — Other Ambulatory Visit: Payer: Self-pay | Admitting: Neurology

## 2022-02-19 ENCOUNTER — Other Ambulatory Visit: Payer: Self-pay | Admitting: *Deleted

## 2022-02-19 MED ORDER — BENAZEPRIL HCL 20 MG PO TABS: 20.0000 mg | ORAL_TABLET | Freq: Every day | ORAL | 3 refills | Status: DC

## 2022-03-09 ENCOUNTER — Other Ambulatory Visit: Payer: Self-pay | Admitting: Family Medicine

## 2022-04-22 ENCOUNTER — Ambulatory Visit (INDEPENDENT_AMBULATORY_CARE_PROVIDER_SITE_OTHER): Payer: BC Managed Care – PPO | Admitting: Family

## 2022-04-22 ENCOUNTER — Encounter: Payer: Self-pay | Admitting: Family

## 2022-04-22 VITALS — BP 110/72 | HR 61 | Temp 98.0°F | Ht 77.0 in | Wt 219.0 lb

## 2022-04-22 DIAGNOSIS — R21 Rash and other nonspecific skin eruption: Secondary | ICD-10-CM

## 2022-04-22 MED ORDER — HYDROXYZINE HCL 10 MG PO TABS: 10.0000 mg | ORAL_TABLET | Freq: Two times a day (BID) | ORAL | 0 refills | Status: DC | PRN

## 2022-04-22 MED ORDER — TRIAMCINOLONE ACETONIDE 0.5 % EX OINT: 1.0000 | TOPICAL_OINTMENT | Freq: Two times a day (BID) | CUTANEOUS | 0 refills | Status: DC

## 2022-04-22 NOTE — Progress Notes (Signed)
Patient ID: Rodney Scott, male    DOB: [birth year], 71 y.o.   MRN: [id number]  Chief Complaint  Patient presents with   Itching    Pt c/o wrist and ankles for about a week. Redness, bumps and itching but not painful. Has tried anti-itch creams which does help for a little while. Pt states he stays away from poison ivy and etc.     HPI: Dermatitis: Patient complains of a rash. Symptoms began 1 week ago. Reports red itchy bumps on bilateral ankles and wrists, denies exposure to poison ivy, but had been off last week and was working out in the yard. pt also reports very dry skin on both legs and ankles.   Assessment & Plan:  1. Skin rash tried using OTC hydrocortisone without much relief. Advised cleaning rash with soap & water, dry well, apply thin layer of Kenalog ointment and cover with a thick cream, e.g. generic Eucerin, Cetaphil, or CeraVe daily for 1 week, when rash is healed, continue to use cream 1-2 times per day.  - triamcinolone ointment (KENALOG) 0.5 %; Apply 1 Application topically 2 (two) times daily.  Dispense: 30 g; Refill: 0 - hydrOXYzine (ATARAX) 10 MG tablet; Take 1 tablet (10 mg total) by mouth 2 (two) times daily as needed for itching. May cause drowsiness.  Dispense: 30 tablet; Refill: 0   Subjective:    Outpatient Medications Prior to Visit  Medication Sig Dispense Refill   benazepril (LOTENSIN) 20 MG tablet Take 1 tablet (20 mg total) by mouth daily. 90 tablet 3   desvenlafaxine (PRISTIQ) 50 MG 24 hr tablet Take 1 tablet (50 mg total) by mouth daily. 90 tablet 3   donepezil (ARICEPT) 10 MG tablet TAKE 1 TABLET BY MOUTH AT BEDTIME 90 tablet 0   Loratadine 10 MG CAPS Take by mouth daily.     Multiple Vitamin (MULTIVITAMIN) capsule Take by mouth.     rosuvastatin (CRESTOR) 40 MG tablet Take 1 tablet by mouth once daily 90 tablet 0   XARELTO 20 MG TABS tablet Take 1 tablet by mouth once daily 90 tablet 0   No facility-administered medications prior to visit.    Past Medical History:  Diagnosis Date   Allergic rhinitis 07/31/2007   Loratadine at times- but mild mental fog on this    BPH associated with nocturia 03/10/2018   Clotting disorder    Essential hypertension, benign 07/26/2014   Benazepril '20mg'$     Factor V Leiden 07/31/2007   Xarelto '20mg'$  daily. 2 DVT 2001, PE 2011.     History of colonic polyps 07/31/2007   02/2015 adenoma. 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 Patient states thought was external.      Left inguinal hernia 12/05/2016   Declines surgery referral- minimally bothering him. Can call in if he changes his mind   Major depressive disorder in full remission 03/24/2009   Wellbutrin xl '300mg'$ -->'150mg'$ --> off 03/2020  pristiq '50mg'$ .   In past-->remeron '15mg'$  mainly takes prn for sleep zoloft --> pristiq per sister who is psychiatrist apparetnly   MCI (mild cognitive impairment) 06/15/2015   Osteoarthritis    Right rotator cuff tear 12/07/2010   S/p surgery.     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 COmpression stocking on left. L >R swelling-history of 1st DVT in L.      Past Surgical History:  Procedure Laterality Date   right rotator cuff repair     SALIVARY STONE REMOVAL     Allergies  Allergen Reactions   Yellow Jacket Venom [Bee Venom] Shortness Of Breath    Had swelling localized, chest tightness, and shortness of breath      Objective:    Physical Exam Vitals and nursing note reviewed.  Constitutional:      General: He is not in acute distress.    Appearance: Normal appearance.  HENT:     Head: Normocephalic.  Cardiovascular:     Rate and Rhythm: Normal rate and regular rhythm.  Pulmonary:     Effort: Pulmonary effort is  normal.     Breath sounds: Normal breath sounds.  Musculoskeletal:        General: Normal range of motion.     Cervical back: Normal range of motion.  Skin:    General: Skin is warm and dry.     Findings: Rash (erythema with a few small pinpoint red bumps on bilateral wrists and ankles) present.  Neurological:     Mental Status: He is alert and oriented to person, place, and time.  Psychiatric:        Mood and Affect: Mood normal.    BP 110/72 (BP Location: Left Arm, Patient Position: Sitting, Cuff Size: Large)   Pulse 61   Temp 98 F (36.7 C) (Temporal)   Ht '6\' 5"'$  (1.956 m)   Wt 219 lb (99.3 kg)   SpO2 97%   BMI 25.97 kg/m  Wt Readings from Last 3 Encounters:  04/22/22 219 lb (99.3 kg)  12/05/21 222 lb (100.7 kg)  11/16/21 224 lb 9.6 oz (101.9 kg)       Jeanie Sewer, NP

## 2022-04-22 NOTE — Patient Instructions (Signed)
It was very nice to see you today!   I have sent over a steroid ointment to use on your wrists and ankle rash.  After cleaning with soap and water and drying skin well, apply a thin layer of the ointment to the rash and cover with a thick cream - for example Eucerin, Cetaphil, or CeraVe for dry skin. Do this up to twice a day. When rash is healed, continue using the the thick cream on your skin 1-2 times per day to avoid dryness and peeling.  I also sent over Hydroxyzine pills to take as needed for the itching, these are a low dose, but could possibly cause drowsiness, if they do, ok to take just in evenings or at bedtime.  Call the office if the rash is not improving.      PLEASE NOTE:  If you had any lab tests please let us know if you have not heard back within a few days. You may see your results on MyChart before we have a chance to review them but we will give you a call once they are reviewed by [place]. If we ordered any referrals today, please let us know if you have not heard from their office within the next week.

## 2022-05-11 ENCOUNTER — Other Ambulatory Visit: Payer: Self-pay

## 2022-05-11 ENCOUNTER — Encounter (HOSPITAL_COMMUNITY): Payer: Self-pay

## 2022-05-11 ENCOUNTER — Emergency Department (HOSPITAL_COMMUNITY)
Admission: EM | Admit: 2022-05-11 | Discharge: 2022-05-11 | Disposition: A | Discharge status: Home / Self Care | Payer: BC Managed Care – PPO | Attending: Emergency Medicine | Admitting: Emergency Medicine

## 2022-05-11 DIAGNOSIS — T1501XA Foreign body in cornea, right eye, initial encounter: Secondary | ICD-10-CM | POA: Diagnosis not present

## 2022-05-11 DIAGNOSIS — H40033 Anatomical narrow angle, bilateral: Secondary | ICD-10-CM | POA: Diagnosis not present

## 2022-05-11 DIAGNOSIS — T1591XA Foreign body on external eye, part unspecified, right eye, initial encounter: Secondary | ICD-10-CM

## 2022-05-11 DIAGNOSIS — H2513 Age-related nuclear cataract, bilateral: Secondary | ICD-10-CM | POA: Diagnosis not present

## 2022-05-11 DIAGNOSIS — H539 Unspecified visual disturbance: Secondary | ICD-10-CM | POA: Diagnosis present

## 2022-05-11 DIAGNOSIS — Z7901 Long term (current) use of anticoagulants: Secondary | ICD-10-CM | POA: Diagnosis not present

## 2022-05-11 DIAGNOSIS — X58XXXA Exposure to other specified factors, initial encounter: Secondary | ICD-10-CM | POA: Insufficient documentation

## 2022-05-11 MED ORDER — TETRACAINE HCL 0.5 % OP SOLN
1.0000 [drp] | Freq: Once | OPHTHALMIC | Status: AC
  Administered 2022-05-11: 1 [drp] via OPHTHALMIC
  Filled 2022-05-11: qty 4

## 2022-05-11 MED ORDER — FLUORESCEIN SODIUM 1 MG OP STRP
1.0000 | ORAL_STRIP | Freq: Once | OPHTHALMIC | Status: AC
  Administered 2022-05-11: 1 via OPHTHALMIC
  Filled 2022-05-11: qty 1

## 2022-05-11 NOTE — ED Provider Notes (Signed)
[facility] EMERGENCY DEPARTMENT Provider Note   CSN: [id number] Arrival date & time: 05/11/22  1237     History  Chief Complaint  Patient presents with   Eye Problem    swelling    Rodney Scott is a 71 y.o. male who presents to the ED for evaluation of eye redness and discomfort that occurred after doing some carpentry 2 days ago on Thursday.  Patient states that he felt that something got into his eye.  He went home and tried to flush it with water but the foreign body sensation persisted.  He has had increased redness to the eye with mild irritation.  He also had some swelling periorbitally.  He has tried flushing it multiple times with saline but still feels lingering foreign body sensation.  Vision is overall unchanged.  He denies fever, chills, nausea, vomiting and diarrhea.   Eye Problem Associated symptoms: redness   Associated symptoms: no discharge        Home Medications Prior to Admission medications   Medication Sig Start Date End Date Taking? Authorizing Provider  benazepril (LOTENSIN) 20 MG tablet Take 1 tablet (20 mg total) by mouth daily. 02/19/22   Marin Olp, MD  desvenlafaxine (PRISTIQ) 50 MG 24 hr tablet Take 1 tablet (50 mg total) by mouth daily. 12/05/21   Marin Olp, MD  donepezil (ARICEPT) 10 MG tablet TAKE 1 TABLET BY MOUTH AT BEDTIME 02/18/22   Rondel Jumbo, PA-C  hydrOXYzine (ATARAX) 10 MG tablet Take 1 tablet (10 mg total) by mouth 2 (two) times daily as needed for itching. May cause drowsiness. 04/22/22   Jeanie Sewer, NP  Loratadine 10 MG CAPS Take by mouth daily.    [provider]  Multiple Vitamin (MULTIVITAMIN) capsule Take by mouth.    [provider]  rosuvastatin (CRESTOR) 40 MG tablet Take 1 tablet by mouth once daily 03/12/22   Marin Olp, MD  triamcinolone ointment (KENALOG) 0.5 % Apply 1 Application topically 2 (two) times daily. 04/22/22   Jeanie Sewer, NP  XARELTO 20 MG TABS tablet Take 1  tablet by mouth once daily 02/18/22   Marin Olp, MD      Allergies    Yellow jacket venom [bee venom]    Review of Systems   Review of Systems  Constitutional:  Negative for fever.  Eyes:  Positive for pain and redness. Negative for discharge and visual disturbance.  Cardiovascular:  Negative for chest pain.    Physical Exam Updated Vital Signs BP 121/88 (BP Location: Right Arm)   Pulse 66   Temp 98.1 F (36.7 C) (Oral)   Resp 18   Ht '6\' 4"'$  (1.93 m)   Wt 95.3 kg   SpO2 99%   BMI 25.56 kg/m  Physical Exam Vitals and nursing note reviewed.  Constitutional:      General: He is not in acute distress.    Appearance: He is not ill-appearing.  HENT:     Head: Atraumatic.  Eyes:     Conjunctiva/sclera: Conjunctivae normal.     Comments: Swelling noted to right periorbital region.  Scleral injection observed.  EOM intact without pain.  Eyes PERRL.  Obvious, small black foreign body noted within the cornea of right eye over iris  Cardiovascular:     Rate and Rhythm: Normal rate and regular rhythm.     Pulses: Normal pulses.     Heart sounds: No murmur heard. Pulmonary:     Effort: Pulmonary effort  is normal. No respiratory distress.     Breath sounds: Normal breath sounds.  Abdominal:     General: Abdomen is flat. There is no distension.     Palpations: Abdomen is soft.     Tenderness: There is no abdominal tenderness.  Musculoskeletal:        General: Normal range of motion.     Cervical back: Normal range of motion.  Skin:    General: Skin is warm and dry.     Capillary Refill: Capillary refill takes less than 2 seconds.  Neurological:     General: No focal deficit present.     Mental Status: He is alert.  Psychiatric:        Mood and Affect: Mood normal.     ED Results / Procedures / Treatments   Labs (all labs ordered are listed, but only abnormal results are displayed) Labs Reviewed - No data to display  EKG None  Radiology No results  found.  Procedures Procedures   Medications Ordered in ED Medications  tetracaine (PONTOCAINE) 0.5 % ophthalmic solution 1 drop (1 drop Left Eye Given 05/11/22 1452)  fluorescein ophthalmic strip 1 strip (1 strip Left Eye Given 05/11/22 1452)    ED Course/ Medical Decision Making/ A&P                           Medical Decision Making Risk Prescription drug management.   71 year old male presents to the emergency department for evaluation of foreign body sensation of the right eye.  Differentials include corneal ulcer, corneal abrasion, foreign body, bacterial conjunctivitis, viral conjunctivitis, allergic conjunctivitis.  Vitals are without acute abnormality.  On exam, the right eye has scleral injection with mild edema noted periorbitally.  There is a visible foreign body in the cornea just over the right iris.  It is small, black and possibly metal in origin.  Fluorescein stain without further evidence of abrasions or ulcers.  Slit-lamp was performed by my attending, Dr. Sabra Heck, and myself without additional foreign body or injury.  I requested consult with ophthalmology and spoke with Dr. Katy Fitch to request follow-up for patient, Monday.  He states that he is currently in office and would like patient to meet him there later this evening.  Discussed this with patient and he was provided address to ophthalmology office.  Advised to present to his appointment at 5 PM this evening for treatment.  Patient expresses understanding and is amenable to plan.  Discharged home in good condition.  Final Clinical Impression(s) / ED Diagnoses Final diagnoses:  Foreign body, eye, right, initial encounter    Rx / DC Orders ED Discharge Orders     None         Rodena Piety 05/11/22 1628    Noemi Chapel, MD 05/13/22 1130

## 2022-05-11 NOTE — ED Notes (Signed)
ED Provider at bedside. 

## 2022-05-11 NOTE — Discharge Instructions (Signed)
There is a visible foreign body in your right eye.  We talked to the ophthalmologist who will be in his office this evening and he would like you to meet him there at 5pm. He will help remove the foreign body and start you on antibiotics.

## 2022-05-11 NOTE — ED Triage Notes (Signed)
Pt reports right eye redness and eye swelling. Pt states it started Thursday. Pt has tried OTC saline eye drops but eye not improving. State he may have got something in it.

## 2022-05-14 DIAGNOSIS — H40033 Anatomical narrow angle, bilateral: Secondary | ICD-10-CM | POA: Diagnosis not present

## 2022-05-14 DIAGNOSIS — H2513 Age-related nuclear cataract, bilateral: Secondary | ICD-10-CM | POA: Diagnosis not present

## 2022-05-14 DIAGNOSIS — T1501XD Foreign body in cornea, right eye, subsequent encounter: Secondary | ICD-10-CM | POA: Diagnosis not present

## 2022-05-16 ENCOUNTER — Ambulatory Visit: Payer: BC Managed Care – PPO | Admitting: Neurology

## 2022-05-17 DIAGNOSIS — H2513 Age-related nuclear cataract, bilateral: Secondary | ICD-10-CM | POA: Diagnosis not present

## 2022-05-17 DIAGNOSIS — H40033 Anatomical narrow angle, bilateral: Secondary | ICD-10-CM | POA: Diagnosis not present

## 2022-05-17 DIAGNOSIS — T1501XD Foreign body in cornea, right eye, subsequent encounter: Secondary | ICD-10-CM | POA: Diagnosis not present

## 2022-05-23 ENCOUNTER — Ambulatory Visit: Payer: BC Managed Care – PPO

## 2022-05-23 ENCOUNTER — Encounter: Payer: Self-pay | Admitting: Psychology

## 2022-05-23 ENCOUNTER — Ambulatory Visit (INDEPENDENT_AMBULATORY_CARE_PROVIDER_SITE_OTHER): Payer: BC Managed Care – PPO | Admitting: Psychology

## 2022-05-23 DIAGNOSIS — G3184 Mild cognitive impairment, so stated: Secondary | ICD-10-CM

## 2022-05-23 DIAGNOSIS — R4189 Other symptoms and signs involving cognitive functions and awareness: Secondary | ICD-10-CM

## 2022-05-23 NOTE — Progress Notes (Signed)
   Psychometrician Note   Cognitive testing was administered to Rodney Scott by Cruzita Lederer, B.S. (psychometrist) under the supervision of Dr. Christia Reading, Ph.D., licensed psychologist on 05/23/2022. Rodney Scott did not appear overtly distressed by the testing session per behavioral observation or responses across self-report questionnaires. Rest breaks were offered.    The battery of tests administered was selected by Dr. Christia Reading, Ph.D. with consideration to Rodney Scott's current level of functioning, the nature of his symptoms, emotional and behavioral responses during interview, level of literacy, observed level of motivation/effort, and the nature of the referral question. This battery was communicated to the psychometrist. Communication between Dr. Christia Reading, Ph.D. and the psychometrist was ongoing throughout the evaluation and Dr. Christia Reading, Ph.D. was immediately accessible at all times. Dr. Christia Reading, Ph.D. provided supervision to the psychometrist on the date of this service to the extent necessary to assure the quality of all services provided.    Rodney Scott will return within approximately 1-2 weeks for an interactive feedback session with Dr. Melvyn Novas at which time his test performances, clinical impressions, and treatment recommendations will be reviewed in detail. Rodney Scott understands he can contact our office should he require our assistance before this time.  A total of 150 minutes of billable time were spent face-to-face with Rodney Scott by the psychometrist. This includes both test administration and scoring time. Billing for these services is reflected in the clinical report generated by Dr. Christia Reading, Ph.D.  This note reflects time spent with the psychometrician and does not include test scores or any clinical interpretations made by Dr. Melvyn Novas. The full report will follow in a separate note.

## 2022-05-23 NOTE — Progress Notes (Signed)
NEUROPSYCHOLOGICAL EVALUATION [place]. [facility] of Neurology  Date of Evaluation: May 23, 2022  Reason for Referral:   Rodney Scott is a 71 y.o. right-handed Caucasian male referred by  Rodney Butters, PA-C , to characterize his current cognitive functioning and assist with diagnostic clarity and treatment planning in the context of a history of mild cognitive impairment with significant memory loss and concerns for progressive cognitive decline.   Assessment and Plan:   Clinical Impression(s): Rodney Scott pattern of performance is suggestive of prominent impairment surrounding all aspects of verbal memory, as well as recognition/consolidation aspects of visual memory. Writing samples also exhibited impairment with evidence for agrammatism (e.g., "Rodney Scott pointing down" or "Rodney Scott biting toy"). However, other tasks assessing receptive and expressive language remained normatively appropriate. A relative weakness was exhibited across executive functioning, while performance variability was exhibited across processing speed. Performances were appropriate relative to age-matched peers across attention/concentration, visuospatial abilities and encoding/retrieval aspects of visual memory.   Relative to his previous evaluation in April 2022, mild performance declines were exhibited across processing speed, executive functioning, and verbal memory. Declines were also seen across complex attention, confrontation naming, and semantic fluency; however, these generally approached performances during his March 2021 evaluation and still remained adequate from a normative perspective. A mild improvement was seen learning and later identifying visual information; however, this could be influenced by happenstance guessing. Recognition/consolidation aspects of visual memory remained impaired. All other assessed domains exhibited stability.   Regarding activities of daily living  (ADLs), Rodney Scott continues to manage medications and drive without significant difficulty. His wife described a few instances where he seemingly forgot about bills which were due and needed to be paid; he attributed this to procrastination. He has noted some work performance issues related to memory loss. 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 However, he has moved somewhat closer to a dementia designation given mild evidence for cognitive and functional decline over the past 16 months.   The etiology continues to be fairly unclear. As stated in previous reports, Alzheimer's disease remains a concern. Despite some evidence to suggest adequate encoding (i.e., learning), retention rates were essentially amnestic across verbal tasks. He also performed poorly across all recognition tasks, even a visual task where retention rates were 75%. This suggests evidence for rapid forgetting and an evolving and already quite significant memory storage impairment, both of which are the hallmark characteristics of this illness. The fact that declines have been quite mild over time is certainly encouraging and would suggest a slowed disease process if truly present. Continued medical monitoring will be important moving forward.   Despite prior concerns, I continue to not see compelling cause for heightened concerns surrounding a primary progressive aphasia presentation. Cognitive and behavioral characteristics do not align with frontotemporal lobar degeneration, Lewy body disease, Parkinson's disease, or another more rare parkinsonian condition. His most recent brain MRI (2020) revealed mild microvascular ischemia, certainly not enough to account for ongoing memory impairment and mild decline.   Recommendations: A repeat neuropsychological evaluation in 18-24 months (or sooner if functional decline is noted) is recommended  to assess the trajectory of future cognitive decline should it occur. This will also aid in future efforts towards improved diagnostic clarity.   Rodney Scott has already been prescribed medication commonly used to treat memory loss and concerns for a degenerative illness (donepezil/Aricept). He is encouraged to continue taking this  medication as prescribed. It is important to highlight that if something like Alzheimer's disease is indeed present, no current treatment is able to stop or reverse cognitive decline. Medications are able to slow functional decline in some individuals.   Given that his last brain MRI was performed in 2020, repeat imaging would be beneficial to better understand any anatomical correlates for observed cognitive decline.    Should there be further progression of current deficits over time, Rodney Scott is unlikely to regain any independent living skills lost. Therefore, it is recommended that he remain as involved as possible in all aspects of household chores, finances, and medication management, with supervision to ensure adequate performance. He will likely benefit from the establishment and maintenance of a routine in order to maximize his functional abilities over time.   It will be important for Rodney Scott to have another person with him when in situations where he may need to process information, weigh the pros and cons of different options, and make decisions, in order to ensure that he fully understands and recalls all information to be considered. If not already done, Rodney Scott and his family may want to discuss his wishes regarding durable power of attorney and medical decision making, so that he can have input into these choices.    Rodney Scott is encouraged to attend to lifestyle factors for brain health (e.g., regular physical exercise, good nutrition habits, regular participation in cognitively-stimulating activities, and general stress management techniques),  which are likely to have benefits for both emotional adjustment and cognition. In fact, in addition to promoting good general health, regular exercise incorporating aerobic activities (e.g., brisk walking, jogging, cycling, etc.) has been demonstrated to be a very effective treatment for depression and stress, with similar efficacy rates to both antidepressant medication and psychotherapy. Optimal control of vascular risk factors (including safe cardiovascular exercise and adherence to dietary recommendations) is encouraged.    When learning new information, he would benefit from information being broken up into small, manageable pieces. He may also find it helpful to articulate the material in his own words and in a context to promote encoding at the onset of a new task. This material may need to be repeated multiple times to promote encoding.   Important information to remember should also be provided in written format in all instances. This should be organized and placed in a highly visible and commonly frequented area of his residence to help promote recall.    To address problems with executive dysfunction, he may wish to consider:   -Avoiding external distractions when needing to concentrate   -Limiting exposure to fast paced environments with multiple sensory demands   -Writing down complicated information and using checklists   -Attempting and completing one task at a time (i.e., no multi-tasking)   -Verbalizing aloud each step of a task to maintain focus   -Reducing the amount of information considered at one time  Review of Records:   Mr. Hardman completed a comprehensive neuropsychological evaluation Howard Pouch, Ph.D.) on 10/26/2018. Results suggested select executive dysfunction mildly affecting memory performances. His mildly frontal-subcortical pattern was said to potentially relate to various cerebrovascular risk factors (hypertension, hyperlipidemia, historical DVT). Deficits  were also said to potentially be influenced by alcohol abuse as Dr. Darol Destine reported a remote history of Mr. Hendrickson consuming 3-4 beers per day, stopping sometime in 2019. Continued monitoring was recommended, along with repeat testing in the future.    Mr. Stankey completed a comprehensive neuropsychological evaluation [facility]  Nicole Kindred, Psy.D.) on 01/03/2020. Results suggested changes with respect to memory affecting learning and retention of both verbal and visual information. This pattern was said to be similar to difficulties that Mr. Elson demonstrated during a prior evaluation in 2020, albeit with possibly greater visual memory difficulties. He did retain some information across time and thus was not demonstrating a complete memory storage issue. Diminished processing speed was also noticed. With respect to language, he did not have any abnormal test scores despite extended testing, although his performance was slightly lower from a raw score perspective on animal fluency, which may be significant given that one would expect practice effects. He performed well on all measures of executive functioning and screened negative for the presence of depression. Overall, Dr. Nicole Kindred found Mr. 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 He was ultimately diagnosed with a mild cognitive impairment. Repeat testing was recommended.    Mr. Gencarelli was seen by [facility] [name], M.D.) on 12/28/2020 for follow-up. He presented for an earlier appointment due to ongoing symptoms of left facial pain. He started noticing left facial pain between his nostril and left ear/cheek in January 2022. He was started on gabapentin which initially did not seem to help much; however, with an increase in dose to 212m TID, his pain improved. He reported observing a dull ache which can  sometimes wake him up in the middle of the night. However, symptoms are mostly noticed when he is at home sitting or laying down. There were said to be no clear triggers and he denied any tenderness to palpation or sensitivity to light touch. He also denied jaw pain when chewing. His wife denied any bruxism but stated that he moves his mouth sometimes and feels like he is eating Rice Crispies. Memory was said to be overall stable. However, his wife did report more "jumps" in memory loss where he would seemingly worsen and then stabilize. He is on donepezil 180mdaily without side effects. ADLs were described as generally intact and no sleep/mood concerns were noted. Ultimately, Mr. WoGlascoeas referred for a repeat neuropsychological evaluation to characterize his cognitive abilities and to assist with diagnostic clarity and treatment planning.   He completed a comprehensive neuropsychological evaluation with myself on 01/18/2021. Results suggested a primary impairment surrounding all aspects of learning and memory. Writing samples also exhibited impairment with evidence for agrammatism (e.g., "Mom mad" or "Grill hot"). However, all other receptive and expressive language assessments were appropriate. A relative weakness was also exhibited across a computerized task assessing hypothesis testing and adaptability; however, performances across other tasks assessing executive functions were appropriate. Performance was also appropriate across processing speed, attention/concentration, and visuospatial abilities. Relative to his previous evaluation, memory performances were generally stable as primary impairments in verbal and visual memory were also seen across his March 2021 evaluation. Very mild declines were seen across discriminability of a previously learned list of words, as well as delayed recall of aspects of daily living (e.g., medication instructions; name, address, and phone number). An additional  performance decline was exhibited across the computerized card sorting task described above. Language assessments were generally stable (writing samples were not assessed during previous evaluations); however, improvements were seen across semantic fluency and confrontation naming tasks. Mild improvements were also seen across attention/concentration. Processing speed and visuospatial abilities were stable. Given generally intact ADLs, he was said to continue to best meet diagnostic criteria  for a mild neurocognitive disorder ("mild cognitive impairment") at that time. Repeat testing was recommended.   He most recently met with [facility] Rodney Scott, [place]) on 11/09/2021 for follow-up. He has been tolerating Aricept well and symptoms were largely said to be stable.    Brain MRI on 10/19/2018 revealed mild chronic microvascular ischemic changes. Cerebral volume was said to be normal. Trigeminal face MRI on 11/30/2020 noted some degenerative changes of the temporomandibular joints and cervical spine.   Past Medical History:  Diagnosis Date   Allergic rhinitis 07/31/2007   Loratadine at times- but mild mental fog on this    BPH associated with nocturia 03/10/2018   Clotting disorder    Current smoker 07/26/2014   Has quit several several times and restarted. Most recently quit 11/06/14. Need to keep a close eye.    Essential hypertension, benign 07/26/2014   Benazepril 89m    Factor V Leiden 07/31/2007   Xarelto 241mdaily. 2 DVT 2001, PE 2011.     History of colonic polyps 07/31/2007   02/2015 adenoma. 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 Patient states thought was external.      Left inguinal hernia 12/05/2016   Declines surgery referral- minimally bothering him. Can call in if he changes his mind   Major depressive disorder in full  remission 03/24/2009   Wellbutrin xl 30040m>117mg--> off 03/2020  pristiq 17m37m In past-->remeron 15mg71mnly takes prn for sleep zoloft --> pristiq per sister who is psychiatrist apparetnly   MCI (mild cognitive impairment) 06/15/2015   Osteoarthritis    Right rotator cuff tear 12/07/2010   S/p surgery.     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 COmpression stocking on left. L >R swelling-history of 1st DVT in L.       Past Surgical History:  Procedure Laterality Date   right rotator cuff repair     SALIVARY STONE REMOVAL      Current Outpatient Medications:    benazepril (LOTENSIN) 20 MG tablet, Take 1 tablet (20 mg total) by mouth daily., Disp: 90 tablet, Rfl: 3   desvenlafaxine (PRISTIQ) 50 MG 24 hr tablet, Take 1 tablet (50 mg total) by mouth daily., Disp: 90 tablet, Rfl: 3   donepezil (ARICEPT) 10 MG tablet, TAKE 1 TABLET BY MOUTH AT BEDTIME, Disp: 90 tablet, Rfl: 0   hydrOXYzine (ATARAX) 10 MG tablet, Take 1 tablet (10 mg total) by mouth 2 (two) times daily as needed for itching. May cause drowsiness., Disp: 30 tablet, Rfl: 0   Loratadine 10 MG CAPS, Take by mouth daily., Disp: , Rfl:    Multiple Vitamin (MULTIVITAMIN) capsule, Take by mouth., Disp: , Rfl:    rosuvastatin (CRESTOR) 40 MG tablet, Take 1 tablet by mouth once daily, Disp: 90 tablet, Rfl: 0   triamcinolone ointment (KENALOG) 0.5 %, Apply 1 Application topically 2 (two) times daily., Disp: 30 g, Rfl: 0   XARELTO 20 MG TABS tablet, Take 1 tablet by mouth once daily, Disp: 90 tablet, Rfl: 0  Clinical Interview:   The following information was obtained during a clinical interview with Mr. WodhaMarxenhis wife prior to cognitive testing.  Cognitive Symptoms: Decreased short-term memory: Endorsed. He  previously described primary difficulties surrounding him recalling the details of  previous conversations, as well as the names of familiar individuals. His wife added that he has had a propensity to misplace objects (including putting items in the wrong location when emptying the dishwasher). She also noted that he has forgotten to turn off the stove a few times when making breakfast. These difficulties were said to be stable. Memory dysfunction was said to have been present since prior to his 2020 evaluation. Per his wife, this has progressively worsened over time, including the time since his most recent evaluation.  Decreased long-term memory: Denied. Decreased attention/concentration: Denied. Reduced processing speed: Endorsed. Difficulties with executive functions: Endorsed. He previously reported a longstanding weakness with mild disorganization which has been stable over time. His wife noted that he seems more indecisive and has trouble with planning and remembering upcoming appointments. Impulsivity was denied. His wife also reported some personality changes in that Mr. Molchan appears more quiet and disengaged, especially around family. This was said to be stable.  Difficulties with emotion regulation: Denied. Difficulties with receptive language: Denied. Difficulties with word finding: Endorsed. His wife previously noted that he will use filler words (i.e., "you know, you know") often while speaking due to experiencing word finding deficits. This was said to have progressively worsened since his previous evaluation.  Decreased visuoperceptual ability: Denied.   Difficulties completing ADLs: Largely denied. He continues to manage medications without significant difficulty. His wife described a few instances where he seemingly forgot about bills which were due and needed to be paid. They have been working on a method of paying bills immediately rather than putting them aside until the due date gets closer as he described a history of procrastination. Ongoing driving related  difficulties were denied.   Additional Medical History: History of traumatic brain injury/concussion: Denied. History of stroke: Denied. History of seizure activity: Denied. History of known exposure to toxins: Denied. Symptoms of chronic pain: Denied. Experience of frequent headaches/migraines: Denied. He previously acknowledged a remote history of headaches but stated that these have become infrequent experiences since starting gabapentin.  Frequent instances of dizziness/vertigo: Denied.   Sensory changes: He has a history of hearing loss and will utilize hearing aids, especially for higher frequencies. He also reported a longstanding diminished sense of both taste and smell. The cause for this was unknown. 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 He reported occasional instances of blurred vision as a result.  Balance/coordination difficulties: Denied. He also denied any recent falls.  Other motor difficulties: Denied.  Sleep History: Estimated hours obtained each night: 10-12 hours per his wife.  Difficulties falling asleep: Denied. Difficulties staying asleep: Denied outside of occasionally waking to use the restroom.  Feels rested and refreshed upon awakening: Endorsed "for the most part."    History of snoring: Unclear.  History of waking up gasping for air: Denied. Witnessed breath cessation while asleep: Denied.   History of vivid dreaming: Denied. Excessive movement while asleep: Denied. Instances of acting out his dreams: Denied.  Psychiatric/Behavioral Health History: Depression: He has a remote history of depressive symptoms in remission and reportedly took an anti-depressant medication at that time. No current medication intervention was reported. He described his current mood as "good" and denied acute depressive symptoms. Current or remote suicidal ideation, intent, or plan was denied.  Anxiety:  Denied. Mania: Denied. Trauma History: Denied. Visual/auditory hallucinations: Denied. Delusional thoughts: Denied.  Tobacco: Endorsed. He reported consuming one pack of cigarettes per day.  Alcohol: He reported an occasional beer in the evenings but generally infrequent alcohol consumption. Medical records do suggest a history of potential alcohol abuse where he was consuming 3-4 beers per night. Alcohol use was reportedly diminished in 2019.  Recreational drugs: Denied.  Family History: Problem Relation Age of Onset   Memory loss Mother    Colon cancer Father 9   Factor V Leiden deficiency Son    Esophageal cancer Neg Hx    Rectal cancer Neg Hx    Stomach cancer Neg Hx    This information was confirmed by Mr. Vanderploeg.  Academic/Vocational History: Highest level of educational attainment: 12 years. He graduated high school and described himself as an average (C) student in academic settings. However, performances were somewhat subject dependant. For example, social studies represented a strength while math was a relative weakness. He reported engaging in summer school courses in middle school due to academic performance but could not recall what subject(s) this was for.  History of developmental delay: Denied. History of grade repetition: Denied. Enrollment in special education courses: Denied. History of LD/ADHD: Denied.   Employment: He currently works in Theatre manager for [employer]. He did note that co-workers and supervisors have expressed "mild" concerns surrounding memory abilities affecting work Systems analyst. His wife previously noted some trouble understanding instructions and navigating to and from job sites. 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   Evaluation Results:   Behavioral Observations: Mr. Pascua was accompanied by his wife, arrived to his appointment on time, and was  appropriately dressed and groomed. He appeared alert and oriented. Observed gait and station were within normal limits. Gross motor functioning appeared intact upon informal observation and no abnormal movements (e.g., tremors) were noted. His affect was generally relaxed and positive, but did range appropriately given the subject being discussed during the clinical interview or the task at hand during testing procedures. Spontaneous speech was fluent and word finding difficulties were not observed during the clinical interview. Thought processes were coherent, organized, and normal in content. Insight into his cognitive difficulties appeared generally adequate.   During testing, sustained attention was appropriate. Task engagement was adequate and he persisted when challenged. Overall, Mr. Hommel was cooperative with the clinical interview and subsequent testing procedures.   Adequacy of Effort: The validity of neuropsychological testing is limited by the extent to which the individual being tested may be assumed to have exerted adequate effort during testing. Mr. Gadbois expressed his intention to perform to the best of his abilities and exhibited adequate task engagement and persistence. Scores across stand-alone and embedded performance validity measures were within expectation. As such, the results of the current evaluation are believed to be a valid representation of Mr. Villeda's current cognitive functioning.  Test Results: Mr. Weisinger was largely oriented at the time of the current evaluation. He was two days off when stating the current date and 75 minutes off when estimating the current time.   Intellectual abilities based upon educational and vocational attainment were estimated to be in the average range. Premorbid abilities were estimated to be within the average range based upon a single-word reading test.   Processing speed was variable, ranging from the well below average to average  normative ranges. Basic attention was average. More complex attention (e.g., working memory) was below average. Executive functioning was largely well below average  to below average. He also performed in the below average range across a task assessing safety and judgment.   Assessed receptive language abilities were above average. Likewise, Mr. Dilauro did not exhibit any difficulties comprehending task instructions and answered all questions asked of him appropriately. Assessed expressive language (e.g., verbal fluency and confrontation naming) was largely below average to average. He did exhibit an isolated weakness across writing samples. Agrammatism was exhibited (e.g., "Rodney Scott pointing down" or "Rodney Scott biting toy").   Assessed visuospatial/visuoconstructional abilities were variable but overall adequate, ranging from the below average to well above average normative ranges. Points were lost on his drawing of a clock due to the clock numbers being placed outside the clock face.    Learning (i.e., encoding) of novel verbal and visual information was below average. Spontaneous delayed recall (i.e., retrieval) of previously learned information was below average across a visual task but exceptionally low to well below average across all verbal tasks. Retention rates were 0% across a story learning task, 0% across a list learning task, 25% across a daily living task, and 75% across a shape learning task. Performance across recognition tasks was exceptionally low to well below average, suggesting negligible evidence for information consolidation.   Results of emotional screening instruments suggested that recent symptoms of generalized anxiety were in the minimal to mild range, while symptoms of depression were within normal limits. A screening instrument assessing recent sleep quality suggested the presence of minimal sleep dysfunction.  Tables of Scores:   Note: This summary of test scores accompanies the  interpretive report and should not be considered in isolation without reference to the appropriate sections in the text. Descriptors are based on appropriate normative data and may be adjusted based on clinical judgment. Terms such as "Within Normal Limits" and "Outside Normal Limits" are used when a more specific description of the test score cannot be determined. Descriptors refer to the current evaluation only.          Percentile - Normative Descriptor > 98 - Exceptionally High 91-97 - Well Above Average 75-90 - Above Average 25-74 - Average 9-24 - Below Average 2-8 - Well Below Average < 2 - Exceptionally Low          March 2021 April 2022 Current    Orientation:        Raw Score Raw Score Raw Score Percentile   NAB Orientation, Form 1 --- 29/29 26/29 --- ---         Cognitive Screening:        Raw Score Raw Score Raw Score Percentile   SLUMS: --- 26/30 21/30 --- ---         Intellectual Functioning:        Standard Score Standard Score Standard Score Percentile   Test of Premorbid Functioning: --- 99 90 25 Average         Memory:       NAB Memory Module, Form 1: T Score Standard Score/ T Score Standard Score/ T Score Percentile   Total Memory Index --- 65 71 3 Well Below Average  List Learning         Total Trials 1-3 35 15/36 (41) 15/36 (42) 21 Below Average    List B 46 4/12 (53) 0/12 (26) 1 Exceptionally Low    Short Delay Free Recall 24 0/12 (24) 2/12 (33) 5 Well Below Average    Long Delay Free Recall 30 0/12 (30) 0/12 (32) 4 Well Below Average    Retention Percentage ---  0 (<19) 0 (22) <1 Exceptionally Low    Recognition Discriminability 34 %ile 3 (37) 2 (36) 8 Well Below Average  Shape Learning         Total Trials 1-3 --- 3/27 (19) 10/27 (40) 16 Below Average    Delayed Recall --- 3/9 (39) 3/9 (40) 16 Below Average    Retention Percentage --- 150 (67) 75 (43) 25 Average    Recognition Discriminability --- 3 (32) 3 (31) 3 Well Below Average  Story Learning          Immediate Recall 37 36/80 (35) 41/80 (39) 14 Below Average    Delayed Recall 35 0/40 (33) 0/40 (34) 5 Well Below Average    Retention Percentage --- 0 (<19) 0 (<19) <1 Exceptionally Low  Daily Living Memory         Immediate Recall 35 33/51 (41) 31/51 (41) 18 Below Average    Delayed Recall 48 5/17 (26) 3/17 (21) <1 Exceptionally Low    Retention Percentage --- 33 (<19) 25 (<19) <1 Exceptionally Low    Recognition Hits 19 4/19 (<19) 3/10 (<19) <1 Exceptionally Low         Attention/Executive Function:       Trail Making Test (TMT): T Score Raw Score (T Score) Raw Score (T Score) Percentile     Part A 53 40 secs.,  0 errors (44) 37 secs.,  0 errors (50) 50 Average    Part B 48 118 secs.,  1 error (44) 143 secs., 1 error (42) 21 Below Average           Scaled Score Scaled Score Scaled Score Percentile   WAIS-IV Coding: --- _0 Average         NAB Attention Module, Form 1: T Score T Score T Score Percentile     Digits Forward 41 54 55 69 Average    Digits Backwards 39 46 39 14 Below Average         D-KEFS Color-Word Interference Test: Raw Score (Scaled Score) Raw Score (Scaled Score) Raw Score (Scaled Score) Percentile     Color Naming --- 39 secs. (7) 44 secs. (6) 9 Below Average    Word Reading --- 28 secs. (8) 37 secs. (4) 2 Well Below Average    Inhibition --- 86 secs. (7) 105 secs. (5) 5 Well Below Average      Total Errors --- 1 error (11) 0 errors (13) 84 Above Average    Inhibition/Switching --- 101 secs. (7) 123 secs. (4) 2 Well Below Average      Total Errors --- 6 errors (7) 6 errors (7) 16 Below Average         [place] Card Sorting Test: Raw Score Raw Score Raw Score Percentile     Categories (trials) --- 1 (64) 1 (64) 11-16 Below Average    Total Errors --- 32 30 16 Below Average    Perseverative Errors --- 17 12 50 Average    Non-Perseverative Errors --- _1 Well Below Average    Failure to Maintain Set --- 0 1 --- ---         NAB Executive  Functions Module, Form 1: T Score T Score T Score Percentile     Judgment --- --- 41 18 Below Average         Language:        Raw Score Raw Score Raw Score Percentile   Sentence Repetition: --- 14/22 13/22 11 Below Average  Verbal Fluency Test: T Score Raw Score (T Score) Raw Score (T Score) Percentile     Phonemic Fluency (FAS) 48 35 (48) 42 (56) 73 Average    Animal Fluency 39 18 (60) 16 (47) 38 Average          NAB Language Module, Form 1: T Score T Score T Score Percentile     Oral Production 48 45 53 62 Average    Auditory Comprehension --- 58 58 79 Above Average    Reading Comprehension --- 13/13 13/13 --- Within Normal Limits    Naming 39 31/31 (57) 28/31 (41) 18 Below Average    Writing --- 24 34 5 Well Below Average         Visuospatial/Visuoconstruction:        Raw Score Raw Score Raw Score Percentile   Clock Drawing: --- 10/10 8/10 --- Within Normal Limits         NAB Spatial Module, Form 1: T Score T Score T Score Percentile     Figure Drawing Copy --- 45 42 21 Below Average          Scaled Score Scaled Score Scaled Score Percentile   WAIS-IV Block Design: --- --- 14 68 Well Above Average         Mood and Personality:        Raw Score Raw Score Raw Score Percentile   Geriatric Depression Scale: --- 8 7 --- Within Normal Limits  Geriatric Anxiety Scale: --- 1 11 --- Minimal    Somatic --- 0 2 --- Minimal    Cognitive --- 0 5 --- Mild    Affective --- 1 4 --- Mild         Additional Questionnaires:        Raw Score Raw Score Raw Score Percentile   PROMIS Sleep Disturbance Questionnaire: --- 13 14 --- None to Slight   Informed Consent and Coding/Compliance:   The current evaluation represents a clinical evaluation for the purposes previously outlined by the referral source and is in no way reflective of a forensic evaluation.   Mr. Milbourne was provided with a verbal description of the nature and purpose of the present neuropsychological evaluation. Also  reviewed were the foreseeable risks and/or discomforts and benefits of the procedure, limits of confidentiality, and mandatory reporting requirements of this provider. The patient was given the opportunity to ask questions and receive answers about the evaluation. Oral consent to participate was provided by the patient.   This evaluation was conducted by Christia Reading, Ph.D., ABPP-CN, board certified clinical neuropsychologist. Mr. Henricks completed a clinical interview with Dr. Melvyn Novas, billed as one unit [phone], and 150 minutes of cognitive testing and scoring, billed as one unit [phone] and four additional units [id number]. Psychometrist Cruzita Lederer, B.S., assisted Dr. Melvyn Novas with test administration and scoring procedures. As a separate and discrete service, Dr. Melvyn Novas spent a total of 160 minutes in interpretation and report writing billed as one unit [phone] and two units [id number].

## 2022-05-24 ENCOUNTER — Other Ambulatory Visit: Payer: Self-pay | Admitting: Physician Assistant

## 2022-05-24 DIAGNOSIS — H2513 Age-related nuclear cataract, bilateral: Secondary | ICD-10-CM | POA: Diagnosis not present

## 2022-05-24 DIAGNOSIS — T1501XD Foreign body in cornea, right eye, subsequent encounter: Secondary | ICD-10-CM | POA: Diagnosis not present

## 2022-05-24 DIAGNOSIS — H40033 Anatomical narrow angle, bilateral: Secondary | ICD-10-CM | POA: Diagnosis not present

## 2022-05-27 NOTE — Telephone Encounter (Signed)
Enough given only until appt with Sharene Butters PA-C

## 2022-06-03 ENCOUNTER — Ambulatory Visit (INDEPENDENT_AMBULATORY_CARE_PROVIDER_SITE_OTHER): Payer: BC Managed Care – PPO | Admitting: Psychology

## 2022-06-03 DIAGNOSIS — G3184 Mild cognitive impairment, so stated: Secondary | ICD-10-CM | POA: Diagnosis not present

## 2022-06-03 NOTE — Progress Notes (Signed)
   Neuropsychology Feedback Session [name]. [place] [facility]  Reason for Referral:   Rodney Scott is a 71 y.o. right-handed Caucasian male referred by  Sharene Butters, PA-C , to characterize his current cognitive functioning and assist with diagnostic clarity and treatment planning in the context of a history of mild cognitive impairment with significant memory loss and concerns for progressive cognitive decline.   Feedback:   [name] completed a comprehensive neuropsychological evaluation on 05/23/2022. Please refer to that encounter for the full report and recommendations. Briefly, results suggested  prominent impairment surrounding all aspects of verbal memory, as well as recognition/consolidation aspects of visual memory. Writing samples also exhibited impairment with evidence for agrammatism; however, other tasks assessing receptive and expressive language remained normatively appropriate. A relative weakness was exhibited across executive functioning, while performance variability was exhibited across processing speed. Relative to his previous evaluation in April 2022, mild performance declines were exhibited across processing speed, executive functioning, and verbal memory. As stated in previous reports, Alzheimer's disease remains a concern. Despite some evidence to suggest adequate encoding (i.e., learning), retention rates were essentially amnestic across verbal tasks. He also performed poorly across all recognition tasks. This suggests evidence for rapid forgetting and an evolving and already quite significant memory storage impairment, both of which are the hallmark characteristics of this illness. The fact that declines have been quite mild over time is certainly encouraging and would suggest a slowed disease process if truly present.  [name] was accompanied by his wife during the current feedback session. Content of the current session  focused on the results of his neuropsychological evaluation. [name] was given the opportunity to ask questions and his questions were answered. He was encouraged to reach out should additional questions arise. A copy of his report was provided at the conclusion of the visit.      25 minutes were spent conducting the current feedback session with [name], billed as one unit [phone].

## 2022-06-04 ENCOUNTER — Other Ambulatory Visit: Payer: Self-pay | Admitting: Family Medicine

## 2022-06-07 ENCOUNTER — Encounter: Payer: Self-pay | Admitting: Physician Assistant

## 2022-06-07 ENCOUNTER — Ambulatory Visit (INDEPENDENT_AMBULATORY_CARE_PROVIDER_SITE_OTHER): Payer: BC Managed Care – PPO | Admitting: Physician Assistant

## 2022-06-07 VITALS — BP 87/54 | HR 67 | Resp 18 | Ht 76.0 in | Wt 219.0 lb

## 2022-06-07 DIAGNOSIS — G3184 Mild cognitive impairment, so stated: Secondary | ICD-10-CM | POA: Diagnosis not present

## 2022-06-07 DIAGNOSIS — F0280 Dementia in other diseases classified elsewhere without behavioral disturbance: Secondary | ICD-10-CM | POA: Diagnosis not present

## 2022-06-07 DIAGNOSIS — G309 Alzheimer's disease, unspecified: Secondary | ICD-10-CM | POA: Diagnosis not present

## 2022-06-07 NOTE — Patient Instructions (Signed)
Good to see you. Continue Donepezil '10mg'$  daily.  Follow-up in 6 months  Consider behavioral therapy   FALL PRECAUTIONS: Be cautious when walking. Scan the area for obstacles that may increase the risk of trips and falls. When getting up in the mornings, sit up at the edge of the bed for a few minutes before getting out of bed. Consider elevating the bed at the head end to avoid drop of blood pressure when getting up. Walk always in a well-lit room (use night lights in the walls). Avoid area rugs or power cords from appliances in the middle of the walkways. Use a walker or a cane if necessary and consider physical therapy for balance exercise. Get your eyesight checked regularly.  FINANCIAL OVERSIGHT: Supervision, especially oversight when making financial decisions or transactions is also recommended as difficulties arise.  HOME SAFETY: Consider the safety of the kitchen when operating appliances like stoves, microwave oven, and blender. Consider having supervision and share cooking responsibilities until no longer able to participate in those. Accidents with firearms and other hazards in the house should be identified and addressed as well.  DRIVING: Regarding driving, in patients with progressive memory problems, driving will be impaired. We advise to have someone else do the driving if trouble finding directions or if minor accidents are reported. Independent driving assessment is available to determine safety of driving.  ABILITY TO BE LEFT ALONE: If patient is unable to contact 911 operator, consider using LifeLine, or when the need is there, arrange for someone to stay with patients. Smoking is a fire hazard, consider supervision or cessation. Risk of wandering should be assessed by caregiver and if detected at any point, supervision and safe proof recommendations should be instituted.  MEDICATION SUPERVISION: Inability to self-administer medication needs to be constantly addressed. Implement a  mechanism to ensure safe administration of the medications.  RECOMMENDATIONS FOR ALL PATIENTS WITH MEMORY PROBLEMS: 1. Continue to exercise (Recommend 30 minutes of walking everyday, or 3 hours every week) 2. Increase social interactions - continue going to [place] and enjoy social gatherings with friends and family 3. Eat healthy, avoid fried foods and eat more fruits and vegetables 4. Maintain adequate blood pressure, blood sugar, and blood cholesterol level. Reducing the risk of stroke and cardiovascular disease also helps promoting better memory. 5. Avoid stressful situations. Live a simple life and avoid aggravations. Organize your time and prepare for the next day in anticipation. 6. Sleep well, avoid any interruptions of sleep and avoid any distractions in the bedroom that may interfere with adequate sleep quality 7. Avoid sugar, avoid sweets as there is a strong link between excessive sugar intake, diabetes, and cognitive impairment We discussed the Mediterranean diet, which has been shown to help patients reduce the risk of progressive memory disorders and reduces cardiovascular risk. This includes eating fish, eat fruits and green leafy vegetables, nuts like almonds and hazelnuts, walnuts, and also use olive oil. Avoid fast foods and fried foods as much as possible. Avoid sweets and sugar as sugar use has been linked to worsening of memory function.      Mediterranean Diet  Why follow it? Research shows. Those who follow the Mediterranean diet have a reduced risk of heart disease  The diet is associated with a reduced incidence of Parkinson's and Alzheimer's diseases People following the diet may have longer life expectancies and lower rates of chronic diseases  The Dietary Guidelines for Americans recommends the Mediterranean diet as an eating plan to promote health and  prevent disease  What Is the Mediterranean Diet?  Healthy eating plan based on typical foods and recipes of  Mediterranean-style cooking The diet is primarily a plant based diet; these foods should make up a majority of meals   Starches - Plant based foods should make up a majority of meals - They are an important sources of vitamins, minerals, energy, antioxidants, and fiber - Choose whole grains, foods high in fiber and minimally processed items  - Typical grain sources include wheat, oats, barley, corn, brown rice, bulgar, farro, millet, polenta, couscous  - Various types of beans include chickpeas, lentils, fava beans, black beans, white beans   Fruits  Veggies - Large quantities of antioxidant rich fruits & veggies; 6 or more servings  - Vegetables can be eaten raw or lightly drizzled with oil and cooked  - Vegetables common to the traditional Mediterranean Diet include: artichokes, arugula, beets, broccoli, brussel sprouts, cabbage, carrots, celery, collard greens, cucumbers, eggplant, kale, leeks, lemons, lettuce, mushrooms, okra, onions, peas, peppers, potatoes, pumpkin, radishes, rutabaga, shallots, spinach, sweet potatoes, turnips, zucchini - Fruits common to the Mediterranean Diet include: apples, apricots, avocados, cherries, clementines, dates, figs, grapefruits, grapes, melons, nectarines, oranges, peaches, pears, pomegranates, strawberries, tangerines  Fats - Replace butter and margarine with healthy oils, such as olive oil, canola oil, and tahini  - Limit nuts to no more than a handful a day  - Nuts include walnuts, almonds, pecans, pistachios, pine nuts  - Limit or avoid candied, honey roasted or heavily salted nuts - Olives are central to the [employer] - can be eaten whole or used in a variety of dishes   Meats Protein - Limiting red meat: no more than a few times a month - When eating red meat: choose lean cuts and keep the portion to the size of deck of cards - Eggs: approx. 0 to 4 times a week  - Fish and lean poultry: at least 2 a week  - Healthy protein sources include,  chicken, [place], lean beef, lamb - Increase intake of seafood such as tuna, salmon, trout, mackerel, shrimp, scallops - Avoid or limit high fat processed meats such as sausage and bacon  Dairy - Include moderate amounts of low fat dairy products  - Focus on healthy dairy such as fat free yogurt, skim milk, low or reduced fat cheese - Limit dairy products higher in fat such as whole or 2% milk, cheese, ice cream  Alcohol - Moderate amounts of red wine is ok  - No more than 5 oz daily for women (all ages) and men older than age 73  - No more than 10 oz of wine daily for men younger than 32  Other - Limit sweets and other desserts  - Use herbs and spices instead of salt to flavor foods  - Herbs and spices common to the traditional Mediterranean Diet include: basil, bay leaves, chives, cloves, cumin, fennel, garlic, lavender, marjoram, mint, oregano, parsley, pepper, rosemary, sage, savory, sumac, tarragon, thyme   It's not just a diet, it's a lifestyle:  The Mediterranean diet includes lifestyle factors typical of those in the region  Foods, drinks and meals are best eaten with others and savored Daily physical activity is important for overall good health This could be strenuous exercise like running and aerobics This could also be more leisurely activities such as walking, housework, yard-work, or taking the stairs Moderation is the key; a balanced and healthy diet accommodates most foods and drinks Consider portion sizes  and frequency of consumption of certain foods   Meal Ideas & Options:  Breakfast:  Whole wheat toast or whole wheat English muffins with peanut butter & hard boiled egg Steel cut oats topped with apples & cinnamon and skim milk  Fresh fruit: banana, strawberries, melon, berries, peaches  Smoothies: strawberries, bananas, greek yogurt, peanut butter Low fat greek yogurt with blueberries and granola  Egg white omelet with spinach and mushrooms Breakfast couscous: whole  wheat couscous, apricots, skim milk, cranberries  Sandwiches:  Hummus and grilled vegetables (peppers, zucchini, squash) on whole wheat bread   Grilled chicken on whole wheat pita with lettuce, tomatoes, cucumbers or tzatziki  [place] salad on whole wheat bread: tuna salad made with greek yogurt, olives, red peppers, capers, green onions Garlic rosemary lamb pita: lamb sauted with garlic, rosemary, salt & pepper; add lettuce, cucumber, greek yogurt to pita - flavor with lemon juice and black pepper  Seafood:  Mediterranean grilled salmon, seasoned with garlic, basil, parsley, lemon juice and black pepper Shrimp, lemon, and spinach whole-grain pasta salad made with low fat greek yogurt  Seared scallops with lemon orzo  Seared tuna steaks seasoned salt, pepper, coriander topped with tomato mixture of olives, tomatoes, olive oil, minced garlic, parsley, green onions and cappers  Meats:  Herbed greek chicken salad with kalamata olives, cucumber, feta  Red bell peppers stuffed with spinach, bulgur, lean ground beef (or lentils) & topped with feta   Kebabs: skewers of chicken, tomatoes, onions, zucchini, squash  [place] burgers: made with red onions, mint, dill, lemon juice, feta cheese topped with roasted red peppers Vegetarian Cucumber salad: cucumbers, artichoke hearts, celery, red onion, feta cheese, tossed in olive oil & lemon juice  Hummus and whole grain pita points with a greek salad (lettuce, tomato, feta, olives, cucumbers, red onion) Lentil soup with celery, carrots made with vegetable broth, garlic, salt and pepper  Tabouli salad: parsley, bulgur, mint, scallions, cucumbers, tomato, radishes, lemon juice, olive oil, salt and pepper.

## 2022-06-07 NOTE — Progress Notes (Signed)
Assessment/Plan:   Mild Cognitive Impairment due to Alzheimer's Disease  Rodney Scott is a very pleasant 71 y.o. RH male  with a history of hypertension, hyperlipidemia, Factor V Leiden deficiency, DVT on Xarelto,presenting today in follow-up for evaluation of memory loss. Patient is on donepezil 10 mg daily.  Today's MMSE is 29/30 with delayed recall 2/3 otherwise normal.  In January, his MMSE was 22/30.  He had repeat neurocognitive testing on 06/03/2022, yielding the same diagnosis, mild cognitive impairment likely due to Alzheimer's disease.  In view of these results, situational depression is suspected.  He wants to discuss this issue with his PCP.  His wife agrees that he may have some form of depression knowing that he has mild cognitive impairment likely due to Alzheimer's.   Recommendations:   Follow up in 6  months. Continue donepezil 10 mg daily Repeat neurocognitive testing in 18 to 24 months to assess the trajectory of future cognitive decline and diagnostic clarity. Repeat MRI of the brain to better understand any anatomical correlates for observed cognitive decline if clear from his ophthalmologist as he had a recent metallic foreign body in the right eye.  If there is residual, we will proceed with CT of the head instead Recommend good control of cardiovascular risk factors.  He has been informed of his low blood pressure today, may need to modify his blood pressure medication. Recommend behavioral therapy -psychotherapy, in view of likely situational depression due to the diagnosis of mild cognitive impairment due to Alzheimer's disease.    Subjective:   This patient is accompanied in the office by his wife who supplements the history. Previous records as well as any outside records available were reviewed prior to todays visit. Last MMSE in Jan 2023 was 22/30.  He is on donepezil 10 mg daily tolerating well.  Patient is currently on    Any changes in memory since last  visit? "A little worse"-wife reports.  "Many times I have to repeat the schedule for the day, and also, he is thoughts seem to be random, not connected to the context of the conversations ".  For example, yesterday they were discussing about dinner, and he participated by talking about food poisoning in the 1980s.  "When he goes into a store, he does not what I asked "(this is not new).  He also has difficulty remembering recent conversations.   repeats oneself?  Endorsed.  He may repeat the same story or questions. Patient lives with: Wife Disoriented when walking into a room?  Patient denies   Leaving objects in unusual places?  Places things in different places in the kitchen, but not outside of it Ambulates  with difficulty?   Patient denies   Recent falls?  Patient denies   Any head injuries?  Patient denies   History of seizures?   Patient denies   Wandering behavior?  Patient denies   Patient drives?  He has to rely on GPS, locally he is ok  Any mood changes since last visit?  Patient denies   Any worsening depression?:  Patient denies   Hallucinations?  Patient denies   Paranoia?  Patient denies   Patient reports that he sleeps well , but "a lot" up to 12 h during the weekend, without vivid dreams, REM behavior or sleepwalking   History of sleep apnea?  Patient denies   Any hygiene concerns?  Patient denies   Independent of bathing and dressing?  "The other day I told him that  I could not find a laser and he responded, what is a blazer" Does the patient needs help with medications? Patient in charge Who is in charge of the finances? Wife is in charge but they split some bills    Any changes in appetite?  Patient denies   Patient have trouble swallowing? Patient denies   Does the patient cook?  Patient denies   Any kitchen accidents such as leaving the stove on? Patient denies   Any headaches?  Patient denies   Double vision? Patient denies.  He had a recent presentation on 05/11/2022  to the ED due to right foreign body in the eye, metallic, this is being followed by his eye doctor. Any focal numbness or tingling?  Patient denies   Chronic back pain Patient denies   Unilateral weakness?  Patient denies   Any tremors?  Patient denies   Any history of anosmia? Endorsed, for the last 3 years Any incontinence of urine?  Patient denies   Any bowel dysfunction?   Patient denies        History on Initial Assessment 09/25/2018: This is a 71 year old right-handed man with a history of hypertension, hyperlipidemia, Factor V Leiden deficiency, DVT on Xarelto, presenting for evaluation of worsening memory. He and his wife started noticing changes over the past year. He states long-term memory is much better than remembering what he did yesterday. He misplaces things frequently at home, they have lived in the same house for 30 years and his wife has to search for things that have always been in the same place for years. He denies getting lost driving but uses his GPS a lot, he cannot remember the way to places like before. He procrastinates on bills but generally does not miss bills, his wife nods that he has. He occasionally forgets his medications. 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 His wife's major concern is how this has affected caring for their 50 year old granddaughter who has been living with them for the past 5 years. He forgets to feed her, which is new. He goes to a store and forgets what he is doing, she has to give him a pretty detailed note so he can remember events for the day. She has noticed his language skills have decreased markedly, he is not a very verbal person, but she has noticed more difficulty expressing his thoughts or finding words. He may have a thought but stumbles over the words. He repeats himself, one time last summer he told the same story 3-4 times within half an hour. He has always been easily frustrated, but she  feels it is worse, such as when driving, "no one knows what they are doing." No paranoia or hallucinations.    He denies any headaches, dizziness, vision changes, neck/back pain, focal numbness/tingling/weakness, bowel/bladder dysfunction, or tremors. He has lost his sense of smell over the past year for no clear reason. He denies any head injuries. His mother had dementia in her 49s. 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 When asked about mood, he states "I don't know." His wife does not think he is depressed, she is unsure why he is still taking the Wellbutrin and Pristiq, she feels Wellbutrin was started 20 years ago for smoking cessation.  Diagnostic Data: MRI brain with and without contrast in 10/2018 no acute changes, there is mild chronic microvascular disease.   MRI face/trigeminal with and without contrast done 11/30/2020 which did not show any compressive lesion/vascular abnormality on the left. There were degenerative changes in the temporomandibular joints and cervical spine.   Neuropsychological evaluation at [facility] in January 2020 indicated very mild behaviorally observed expressive speech difficulty and select executive dysfunction, diagnosis of unspecified Mild Cognitive Impairment (possibly vascular), unspecified depressive disorder. It was noted that his only language deficit included behaviorally observed occasional use of filler words and difficulty verbally describing some experiences. All objective language testing was within normal limits, no expressive language order at this time   Neuropsychological testing in March 2021 did not show any appreciable change from prior testing with respect to memory affecting learning and retention of both verbal and visual information. 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 He otherwise did well, still meeting criteria for Mild Neurocognitive disorder, with very mild declines compared to prior testing, improvements in some tasks. Etiology remains unclear, still concerning for a degenerative process affecting memory processes such as Alzheimer's disease however trajectory was inconsistent with the improvements noted on his testing. Primary progressive aphasia could not be ruled out, but test results did not strongly suggest this currently.       Neuropsychological Evaluation , Dr. Melvyn Novas  06/03/22. " Briefly, results suggested  prominent impairment surrounding all aspects of verbal memory, as well as recognition/consolidation aspects of visual memory. Writing samples also exhibited impairment with evidence for agrammatism; however, other tasks assessing receptive and expressive language remained normatively appropriate. A relative weakness was exhibited across executive functioning, while performance variability was exhibited across processing speed. Relative to his previous evaluation in April 2022, mild performance declines were exhibited across processing speed, executive functioning, and verbal memory. As stated in previous reports, Alzheimer's disease remains a concern. Despite some evidence to suggest adequate encoding (i.e., learning), retention rates were essentially amnestic across verbal tasks. He also performed poorly across all recognition tasks. This suggests evidence for rapid forgetting and an evolving and already quite significant memory storage impairment, both of which are the hallmark characteristics of this illness. The fact that declines have been quite  mild over time is certainly encouraging and would suggest a slowed disease process if truly present.     Past Medical History:  Diagnosis Date   Allergic rhinitis 07/31/2007   Loratadine at times- but mild mental fog on this    BPH associated with nocturia 03/10/2018   Clotting disorder    Current smoker 07/26/2014   Has quit several several times and restarted. Most recently quit 11/06/14. Need to keep a close eye.    Essential hypertension, benign 07/26/2014   Benazepril '20mg'$     Factor V Leiden 07/31/2007   Xarelto '20mg'$  daily. 2 DVT 2001, PE 2011.     History of colonic polyps 07/31/2007   02/2015 adenoma. 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 Patient states thought was external.      Left inguinal hernia 12/05/2016   Declines  surgery referral- minimally bothering him. Can call in if he changes his mind   Major depressive disorder in full remission 03/24/2009   Wellbutrin xl '300mg'$ -->'150mg'$ --> off 03/2020  pristiq '50mg'$ .   In past-->remeron '15mg'$  mainly takes prn for sleep zoloft --> pristiq per sister who is psychiatrist apparetnly   MCI (mild cognitive impairment) 06/15/2015   Osteoarthritis    Right rotator cuff tear 12/07/2010   S/p surgery.     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 COmpression stocking on left. L >R swelling-history of 1st DVT in L.        Past Surgical History:  Procedure Laterality Date   right rotator cuff repair     SALIVARY STONE REMOVAL       PREVIOUS MEDICATIONS:   CURRENT MEDICATIONS:  Outpatient Encounter Medications as of 06/07/2022  Medication Sig   benazepril (LOTENSIN) 20 MG tablet Take 1 tablet (20 mg total) by mouth daily.   desvenlafaxine (PRISTIQ) 50 MG 24  hr tablet Take 1 tablet (50 mg total) by mouth daily.   donepezil (ARICEPT) 10 MG tablet TAKE 1 TABLET BY MOUTH AT BEDTIME   hydrOXYzine (ATARAX) 10 MG tablet Take 1 tablet (10 mg total) by mouth 2 (two) times daily as needed for itching. May cause drowsiness.   Loratadine 10 MG CAPS Take by mouth daily.   Multiple Vitamin (MULTIVITAMIN) capsule Take by mouth.   rosuvastatin (CRESTOR) 40 MG tablet Take 1 tablet by mouth once daily   triamcinolone ointment (KENALOG) 0.5 % Apply 1 Application topically 2 (two) times daily.   XARELTO 20 MG TABS tablet Take 1 tablet by mouth once daily   No facility-administered encounter medications on file as of 06/07/2022.     Objective:     PHYSICAL EXAMINATION:    VITALS:   Vitals:   06/07/22 0845  BP: (!) 87/54  Pulse: 67  Resp: 18  SpO2: 98%  Weight: 219 lb (99.3 kg)  Height: '6\' 4"'$  (1.93 m)    GEN:  The patient appears stated age and is in NAD. HEENT:  Normocephalic, atraumatic.   Neurological examination:  General: NAD, well-groomed, appears stated age. Orientation: The patient is alert. Oriented to person, place and date Cranial nerves: There is good facial symmetry.The speech is fluent and clear. No aphasia or dysarthria. Fund of knowledge is appropriate. Recent memory impaired and remote memory is normal.  Attention and concentration are normal.  Able to name objects and repeat phrases.  Hearing is intact to conversational tone.    Sensation: Sensation is intact to light touch throughout Motor: Strength is at least antigravity x4. Tremors: none  DTR's 2/4 in UE/LE      04/23/2019    9:00 AM 12/14/2018    2:00 PM 09/25/2018   10:00 AM  Montreal Cognitive Assessment   Visuospatial/ Executive (0/5) '5 4 5  '$ Naming (0/3) '3 3 3  '$ Attention: Read list of digits (0/2) '2 2 2  '$ Attention: Read list of letters (0/1) 0 1 1  Attention: Serial 7 subtraction starting at 100 (0/3) '3 3 3  '$ Language: Repeat phrase (0/2) '2 2 2  '$ Language : Fluency  (0/1) '1 1 1  '$ Abstraction (0/2) '2 2 2  '$ Delayed Recall (0/5) 0 0 0  Orientation (0/6) '6 6 6  '$ Total '24 24 25  '$ Adjusted Score (based on education) 25 25  06/07/2022    9:00 AM 11/09/2021   12:00 PM 09/25/2018    1:22 PM  MMSE - Mini Mental State Exam  Orientation to time '5 5 5  '$ Orientation to Place '5 5 5  '$ Registration '3 3 3  '$ Attention/ Calculation '5 2 5  '$ Recall 2 0 3  Language- name 2 objects '2 2 2  '$ Language- repeat '1 1 1  '$ Language- follow 3 step command '3 1 3  '$ Language- read & follow direction '1 1 1  '$ Write a sentence '1 1 1  '$ Copy design '1 1 1  '$ Total score '29 22 30       '$ Movement examination: Tone: There is normal tone in the UE/LE Abnormal movements:  no tremor.  No myoclonus.  No asterixis.   Coordination:  There is no decremation with RAM's. Normal finger to nose  Gait and Station: The patient has no difficulty arising out of a deep-seated chair without the use of the hands. The patient's stride length is good.  Gait is cautious and narrow.   Thank you for allowing [place] the opportunity to participate in the care of this nice patient. Please do not hesitate to contact us for any questions or concerns.   Total time spent on today's visit was 32 minutes dedicated to this patient today, preparing to see patient, examining the patient, ordering tests and/or medications and counseling the patient, documenting clinical information in the EHR or other health record, independently interpreting results and communicating results to the patient/family, discussing treatment and goals, answering patient's questions and coordinating care.  Cc:  Marin Olp, MD  Sharene Butters 06/07/2022 12:51 PM

## 2022-06-14 ENCOUNTER — Encounter: Payer: Self-pay | Admitting: Family Medicine

## 2022-06-14 ENCOUNTER — Ambulatory Visit (INDEPENDENT_AMBULATORY_CARE_PROVIDER_SITE_OTHER): Payer: BC Managed Care – PPO | Admitting: Family Medicine

## 2022-06-14 DIAGNOSIS — E785 Hyperlipidemia, unspecified: Secondary | ICD-10-CM | POA: Diagnosis not present

## 2022-06-14 DIAGNOSIS — R739 Hyperglycemia, unspecified: Secondary | ICD-10-CM | POA: Diagnosis not present

## 2022-06-14 DIAGNOSIS — R351 Nocturia: Secondary | ICD-10-CM

## 2022-06-14 DIAGNOSIS — N401 Enlarged prostate with lower urinary tract symptoms: Secondary | ICD-10-CM

## 2022-06-14 LAB — URINALYSIS, ROUTINE W REFLEX MICROSCOPIC
Bilirubin Urine: NEGATIVE
Hgb urine dipstick: NEGATIVE
Ketones, ur: NEGATIVE
Leukocytes,Ua: NEGATIVE
Nitrite: NEGATIVE
RBC / HPF: NONE SEEN (ref 0–?)
Specific Gravity, Urine: 1.015 (ref 1.000–1.030)
Total Protein, Urine: NEGATIVE
Urine Glucose: NEGATIVE
Urobilinogen, UA: 0.2 (ref 0.0–1.0)
WBC, UA: NONE SEEN (ref 0–?)
pH: 7 (ref 5.0–8.0)

## 2022-06-14 LAB — CBC WITH DIFFERENTIAL/PLATELET
Basophils Absolute: 0 10*3/uL (ref 0.0–0.1)
Basophils Relative: 0.5 % (ref 0.0–3.0)
Eosinophils Absolute: 0.2 10*3/uL (ref 0.0–0.7)
Eosinophils Relative: 2.1 % (ref 0.0–5.0)
HCT: 43.3 % (ref 39.0–52.0)
Hemoglobin: 14.6 g/dL (ref 13.0–17.0)
Lymphocytes Relative: 27.4 % (ref 12.0–46.0)
Lymphs Abs: 2.3 10*3/uL (ref 0.7–4.0)
MCHC: 33.8 g/dL (ref 30.0–36.0)
MCV: 97.6 fl (ref 78.0–100.0)
Monocytes Absolute: 0.7 10*3/uL (ref 0.1–1.0)
Monocytes Relative: 8.5 % (ref 3.0–12.0)
Neutro Abs: 5.3 10*3/uL (ref 1.4–7.7)
Neutrophils Relative %: 61.5 % (ref 43.0–77.0)
Platelets: 182 10*3/uL (ref 150.0–400.0)
RBC: 4.43 Mil/uL (ref 4.22–5.81)
RDW: 13.5 % (ref 11.5–15.5)
WBC: 8.6 10*3/uL (ref 4.0–10.5)

## 2022-06-14 LAB — COMPREHENSIVE METABOLIC PANEL
ALT: 18 U/L (ref 0–53)
AST: 19 U/L (ref 0–37)
Albumin: 3.9 g/dL (ref 3.5–5.2)
Alkaline Phosphatase: 53 U/L (ref 39–117)
BUN: 19 mg/dL (ref 6–23)
CO2: 29 mEq/L (ref 19–32)
Calcium: 9.6 mg/dL (ref 8.4–10.5)
Chloride: 104 mEq/L (ref 96–112)
Creatinine, Ser: 0.88 mg/dL (ref 0.40–1.50)
GFR: 87.16 mL/min (ref >60.00)
Glucose, Bld: 87 mg/dL (ref 70–99)
Potassium: 4.2 mEq/L (ref 3.5–5.1)
Sodium: 139 mEq/L (ref 135–145)
Total Bilirubin: 0.3 mg/dL (ref 0.2–1.2)
Total Protein: 6.4 g/dL (ref 6.0–8.3)

## 2022-06-14 LAB — LIPID PANEL
Cholesterol: 157 mg/dL (ref 0–200)
HDL: 46.3 mg/dL (ref >39.00)
LDL Cholesterol: 84 mg/dL (ref 0–99)
NonHDL: 111.05
Total CHOL/HDL Ratio: 3
Triglycerides: 134 mg/dL (ref 0.0–149.0)
VLDL: 26.8 mg/dL (ref 0.0–40.0)

## 2022-06-14 LAB — HEMOGLOBIN A1C: Hgb A1c MFr Bld: 5.9 % (ref 4.6–6.5)

## 2022-06-14 NOTE — Progress Notes (Signed)
Phone: [phone]   Subjective:  Patient presents today for their annual physical. Chief complaint-noted.   See problem oriented charting- ROS- full  review of systems was completed and negative  except for: memory issues, seasonal allergies with occasional watery eyes  The following were reviewed and entered/updated in epic: Past Medical History:  Diagnosis Date   Allergic rhinitis 07/31/2007   Loratadine at times- but mild mental fog on this    BPH associated with nocturia 03/10/2018   Clotting disorder    Current smoker 07/26/2014   Has quit several several times and restarted. Most recently quit 11/06/14. Need to keep a close eye.    Essential hypertension, benign 07/26/2014   Benazepril '20mg'$     Factor V Leiden 07/31/2007   Xarelto '20mg'$  daily. 2 DVT 2001, PE 2011.     History of colonic polyps 07/31/2007   02/2015 adenoma. 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 Patient states thought was external.      Left inguinal hernia 12/05/2016   Declines surgery referral- minimally bothering him. Can call in if he changes his mind   Major depressive disorder in full remission 03/24/2009   Wellbutrin xl '300mg'$ -->'150mg'$ --> off 03/2020  pristiq '50mg'$ .   In past-->remeron '15mg'$  mainly takes prn for sleep zoloft --> pristiq per sister who is psychiatrist apparetnly   MCI (mild cognitive impairment) 06/15/2015   Osteoarthritis    Right rotator cuff tear 12/07/2010   S/p surgery.     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 COmpression stocking on left. L >R swelling-history of 1st DVT in L.      Patient Active Problem List   Diagnosis Date Noted   MCI (mild cognitive impairment) 06/15/2015    Priority: High    Current smoker 07/26/2014    Priority: High   Factor V Leiden ([place]) 07/31/2007    Priority: High   History of venous thrombosis and embolism 07/31/2007    Priority: High   BPH associated with nocturia 03/10/2018    Priority: Medium    Hyperglycemia 12/05/2016    Priority: Medium    Essential hypertension, benign 07/26/2014    Priority: Medium    Major depressive disorder in full remission 03/24/2009    Priority: Medium    Hyperlipidemia 07/31/2007    Priority: Medium    Senile purpura 09/22/2020    Priority: Low   Left inguinal hernia 12/05/2016    Priority: Low   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    Priority: Low   Osteoarthritis 07/15/2008    Priority: Low   Venous (peripheral) insufficiency 12/11/2007    Priority: Low   Allergic rhinitis 07/31/2007    Priority: Low   History of colonic polyps 07/31/2007    Priority: Low   Past Surgical History:  Procedure Laterality Date   right rotator cuff repair     SALIVARY STONE REMOVAL      Family History  Problem Relation Age of Onset   Memory loss Mother    Colon cancer Father 64   Factor V Leiden deficiency Son    Esophageal cancer Neg Hx    Rectal cancer Neg Hx    Stomach cancer Neg Hx     Medications- reviewed and  updated Current Outpatient Medications  Medication Sig Dispense Refill   benazepril (LOTENSIN) 20 MG tablet Take 1 tablet (20 mg total) by mouth daily. 90 tablet 3   desvenlafaxine (PRISTIQ) 50 MG 24 hr tablet Take 1 tablet (50 mg total) by mouth daily. 90 tablet 3   hydrOXYzine (ATARAX) 10 MG tablet Take 1 tablet (10 mg total) by mouth 2 (two) times daily as needed for itching. May cause drowsiness. 30 tablet 0   Loratadine 10 MG CAPS Take by mouth daily.     Multiple Vitamin (MULTIVITAMIN) capsule Take by mouth.     rosuvastatin (CRESTOR) 40 MG tablet Take 1 tablet by mouth once daily 90 tablet 0   XARELTO 20 MG TABS tablet Take 1 tablet by mouth once daily 90 tablet 0   donepezil (ARICEPT)  10 MG tablet TAKE 1 TABLET BY MOUTH AT BEDTIME 90 tablet 0   No current facility-administered medications for this visit.    Allergies-reviewed and updated Allergies  Allergen Reactions   Yellow Jacket Venom [Bee Venom] Shortness Of Breath    Had swelling localized, chest tightness, and shortness of breath    Social History   Social History Narrative   Married (wife patient of Dr. Yong Channel). Son and daughter. 1 granddaughter.       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 Oropharynx normal Neck: no thyromegaly CV: RRR no murmurs rubs or gallops Lungs: CTAB no crackles, wheeze, rhonchi Abdomen: soft/nontender/nondistended/normal bowel sounds. No rebound or guarding.  Ext: no edema Skin: warm, dry Neuro: grossly normal, moves all extremities, PERRLA    Assessment and Plan  71 y.o. male presenting for annual physical.  Health Maintenance counseling: 1. Anticipatory guidance: Patient counseled regarding regular dental exams -q6 months, eye exams -yearly (more regularly lately due to slight metal in the eye that they removed,  avoiding smoking and second hand smoke- see below , limiting alcohol to 2 beverages per day - almost never, no illicit drugs.   2. Risk factor reduction:  Advised patient of need for regular exercise and diet rich and fruits and vegetables to reduce risk of heart attack and stroke.  Exercise- still active with work and yardwork at home- no other exercises.  Diet/weight  management-weight overall stable- tries to eat reasonably healthy.  Wt Readings from Last 3 Encounters:  06/14/22 220 lb 12.8 oz (100.2 kg)  06/07/22 219 lb (99.3 kg)  05/11/22 210 lb (95.3 kg)  3. Immunizations/screenings/ancillary studies- plans for flu shot later in season, plans on covid shot when new one released Immunization History  Administered Date(s) Administered   Fluad Quad(high Dose 65+) 07/22/2020, 07/23/2021, 08/01/2021   Influenza Split 07/26/2011, 08/07/2012   Influenza Whole 07/31/2007, 07/15/2008, 07/07/2009, 06/29/2010   Influenza, High Dose Seasonal PF 09/08/2017, 07/15/2018, 07/10/2019   Influenza,inj,Quad PF,6+ Mos 08/20/2013, 07/26/2014, 06/15/2015, 07/05/2016   Influenza-Unspecified 07/10/2019  PFIZER(Purple Top)SARS-COV-2 Vaccination 11/03/2019, 11/24/2019, 07/22/2020, 03/03/2021   PNEUMOCOCCAL CONJUGATE-20 03/03/2021   Pneumococcal Conjugate-13 06/05/2017   Pneumococcal Polysaccharide-23 06/22/2010, 07/15/2018   Td 10/14/2001   Tdap 01/03/2012   Zoster Recombinat (Shingrix) 07/10/2019, 09/29/2019  4. Prostate cancer screening-  low risk prior prostate cancer screening trend- update 1 final - as long as PSA trend remains low risk Lab Results  Component Value Date   PSA 0.60 04/09/2021   PSA 0.38 03/15/2020   PSA 0.40 03/12/2019   5. Colon cancer screening - 2019 with 3 year repeat- he is due and encouraged him to follow up  6. Skin cancer screening- does not see dermatology. advised regular sunscreen use. Denies worrisome, changing, or new skin lesions.  7. Smoking associated screening (lung cancer screening, AAA screen 65-75, UA)- Current smoker- get UA. 45 pack years- opts out lung cancer screening. AAA screen negative in 2018.  8. STD screening - only active with wife  Status of chronic or acute concerns   #Mild cognitive impairment-was started on Aricept 10 mg by Dr. Delice Lesch. He is doing well with this medicine.   - repeat MRI of brain was  ordered  #Factor V Leiden- continues long term xarelto- no recurrence of clotting on medication- continue current meds  #hypertension S: medication: benazepril 20 mg -did have low BP at neurology- not clear what was happening at that time but improved today BP Readings from Last 3 Encounters:  06/14/22 110/74  06/07/22 (!) 87/54  05/11/22 121/88  A/P: Controlled. Continue current medications.   #hyperlipidemia S: Medication:rosuvastatin 40 mg  Lab Results  Component Value Date   CHOL 163 04/09/2021   HDL 54.60 04/09/2021   LDLCALC 79 04/09/2021   LDLDIRECT 86.0 09/13/2019   TRIG 148.0 04/09/2021   CHOLHDL 3 04/09/2021   A/P: LDL goal under 70- update Lipids today  # Hyperglycemia/insulin resistance/prediabetes S:  Medication: none Lab Results  Component Value Date   HGBA1C 6.0 12/05/2021   HGBA1C 6.0 04/09/2021   HGBA1C 5.6 09/22/2020  A/P: hopefully stable- update a1c today.   # Depression S: Medication: pristiq '50mg'$  . Denies depression or anhedonia other than discouraged at times by memory issues  A/P: full remission- continue current medications  #Itchiness diffusely- back in July was given low dose hydroxyzine- thankfully issue largely resolved- he does occasionally take one if recurrent symptoms. Loratadine in AM. No longer using triamcinolone cream.   Recommended follow up: Return in about 6 months (around 12/13/2022) for followup or sooner if needed.Schedule b4 you leave. Future Appointments  Date Time Provider [place]  11/29/2022  8:45 AM [facility] HEALTH COACH [facility]  12/06/2022  9:30 AM Shawn Route, Coralee Pesa, PA-C [facility] None   Lab/Order associations: NOT fasting   ICD-10-CM   1. Hyperglycemia  R73.9 HgB A1c    2. Hyperlipidemia, unspecified hyperlipidemia type  E78.5 CBC with Differential/Platelet    Comprehensive metabolic panel    Lipid panel    Urinalysis, Routine w reflex microscopic    3. BPH associated with nocturia  N40.1 PSA   R35.1        No orders of the defined types were placed in this encounter.   Return precautions advised.  [name], MD

## 2022-06-14 NOTE — Patient Instructions (Addendum)
[facility] contact Please call to schedule visit and/or procedure Address: [address], [place], [place] [address] Phone: [phone]   Strongly encourage quitting smoking-one of the best things you can do for your health  Please stop by lab before you go If you have mychart- we will send your results within 3 business days of [place] receiving them.  If you do not have mychart- we will call you about results within 5 business days of [place] receiving them.  *please also note that you will see labs on mychart as soon as they post. I will later go in and write notes on them- will say "notes from Dr. Yong Channel"    Recommended follow up: Return in about 6 months (around 12/13/2022) for followup or sooner if needed.Schedule b4 you leave.

## 2022-06-16 ENCOUNTER — Other Ambulatory Visit: Payer: Self-pay | Admitting: Family Medicine

## 2022-06-24 ENCOUNTER — Telehealth: Payer: Self-pay | Admitting: Family Medicine

## 2022-06-24 DIAGNOSIS — N401 Enlarged prostate with lower urinary tract symptoms: Secondary | ICD-10-CM

## 2022-06-24 NOTE — Telephone Encounter (Signed)
Future PSA ordered.

## 2022-06-24 NOTE — Telephone Encounter (Signed)
Called patient to schedule lab only visit to re-collect PSA as instructed by Keba. This lab will need to be ordered as future. Pt has been scheduled 09/14 @ 11:30am.

## 2022-06-27 ENCOUNTER — Other Ambulatory Visit (INDEPENDENT_AMBULATORY_CARE_PROVIDER_SITE_OTHER): Payer: BC Managed Care – PPO

## 2022-06-27 DIAGNOSIS — R351 Nocturia: Secondary | ICD-10-CM | POA: Diagnosis not present

## 2022-06-27 DIAGNOSIS — N401 Enlarged prostate with lower urinary tract symptoms: Secondary | ICD-10-CM

## 2022-06-27 LAB — PSA: PSA: 0.46 ng/mL (ref 0.10–4.00)

## 2022-07-02 DIAGNOSIS — H40033 Anatomical narrow angle, bilateral: Secondary | ICD-10-CM | POA: Diagnosis not present

## 2022-07-02 DIAGNOSIS — H2513 Age-related nuclear cataract, bilateral: Secondary | ICD-10-CM | POA: Diagnosis not present

## 2022-07-29 ENCOUNTER — Telehealth: Payer: Self-pay | Admitting: Physician Assistant

## 2022-07-29 DIAGNOSIS — R413 Other amnesia: Secondary | ICD-10-CM

## 2022-07-29 NOTE — Telephone Encounter (Signed)
Patient's wife called and said the patient finished his testing with the eye doctor, he doesn't have any metal in his eye.  Now, patient is ready to proceed with getting his MRI.

## 2022-07-30 NOTE — Telephone Encounter (Signed)
MRI of the brain ordered and sent to [facility]

## 2022-08-06 ENCOUNTER — Encounter: Payer: Self-pay | Admitting: Gastroenterology

## 2022-08-16 ENCOUNTER — Encounter: Payer: Self-pay | Admitting: Physician Assistant

## 2022-08-17 ENCOUNTER — Ambulatory Visit
Admission: RE | Admit: 2022-08-17 | Discharge: 2022-08-17 | Disposition: A | Discharge status: Home / Self Care | Payer: BC Managed Care – PPO | Source: Ambulatory Visit | Attending: Physician Assistant | Admitting: Physician Assistant

## 2022-08-17 DIAGNOSIS — R413 Other amnesia: Secondary | ICD-10-CM

## 2022-08-17 DIAGNOSIS — I6782 Cerebral ischemia: Secondary | ICD-10-CM | POA: Diagnosis not present

## 2022-08-17 MED ORDER — GADOPICLENOL 0.5 MMOL/ML IV SOLN
10.0000 mL | Freq: Once | INTRAVENOUS | Status: AC | PRN
  Administered 2022-08-17: 10 mL via INTRAVENOUS

## 2022-08-22 ENCOUNTER — Other Ambulatory Visit: Payer: Self-pay | Admitting: Physician Assistant

## 2022-08-22 NOTE — Progress Notes (Signed)
MRI brain Mild age related changes in the vessels, and mild atrophy, no acute findings, thanks

## 2022-09-01 ENCOUNTER — Other Ambulatory Visit: Payer: Self-pay | Admitting: Family Medicine

## 2022-09-09 ENCOUNTER — Ambulatory Visit
Admission: EM | Admit: 2022-09-09 | Discharge: 2022-09-09 | Disposition: A | Discharge status: Home / Self Care | Payer: BC Managed Care – PPO

## 2022-09-09 DIAGNOSIS — R519 Headache, unspecified: Secondary | ICD-10-CM | POA: Diagnosis not present

## 2022-09-09 DIAGNOSIS — R42 Dizziness and giddiness: Secondary | ICD-10-CM | POA: Diagnosis not present

## 2022-09-09 DIAGNOSIS — I959 Hypotension, unspecified: Secondary | ICD-10-CM | POA: Diagnosis not present

## 2022-09-09 DIAGNOSIS — R000 Tachycardia, unspecified: Secondary | ICD-10-CM | POA: Diagnosis not present

## 2022-09-09 DIAGNOSIS — R531 Weakness: Secondary | ICD-10-CM

## 2022-09-09 NOTE — ED Triage Notes (Signed)
Pt reports body aches, headache and weakness x 3 days.

## 2022-09-10 ENCOUNTER — Encounter (HOSPITAL_COMMUNITY): Payer: Self-pay

## 2022-09-10 ENCOUNTER — Emergency Department (HOSPITAL_COMMUNITY)
Admission: EM | Admit: 2022-09-10 | Discharge: 2022-09-10 | Disposition: A | Discharge status: Home / Self Care | Payer: BC Managed Care – PPO | Attending: Emergency Medicine | Admitting: Emergency Medicine

## 2022-09-10 ENCOUNTER — Emergency Department (HOSPITAL_COMMUNITY): Payer: BC Managed Care – PPO

## 2022-09-10 ENCOUNTER — Other Ambulatory Visit: Payer: Self-pay

## 2022-09-10 DIAGNOSIS — Z79899 Other long term (current) drug therapy: Secondary | ICD-10-CM | POA: Diagnosis not present

## 2022-09-10 DIAGNOSIS — E860 Dehydration: Secondary | ICD-10-CM | POA: Diagnosis not present

## 2022-09-10 DIAGNOSIS — M791 Myalgia, unspecified site: Secondary | ICD-10-CM | POA: Insufficient documentation

## 2022-09-10 DIAGNOSIS — Z7901 Long term (current) use of anticoagulants: Secondary | ICD-10-CM | POA: Insufficient documentation

## 2022-09-10 DIAGNOSIS — E039 Hypothyroidism, unspecified: Secondary | ICD-10-CM | POA: Diagnosis not present

## 2022-09-10 DIAGNOSIS — R000 Tachycardia, unspecified: Secondary | ICD-10-CM | POA: Insufficient documentation

## 2022-09-10 DIAGNOSIS — R519 Headache, unspecified: Secondary | ICD-10-CM | POA: Diagnosis not present

## 2022-09-10 DIAGNOSIS — Z1152 Encounter for screening for COVID-19: Secondary | ICD-10-CM | POA: Diagnosis not present

## 2022-09-10 DIAGNOSIS — I10 Essential (primary) hypertension: Secondary | ICD-10-CM | POA: Diagnosis not present

## 2022-09-10 DIAGNOSIS — R5381 Other malaise: Secondary | ICD-10-CM | POA: Diagnosis not present

## 2022-09-10 DIAGNOSIS — R5383 Other fatigue: Secondary | ICD-10-CM | POA: Diagnosis not present

## 2022-09-10 LAB — URINALYSIS, ROUTINE W REFLEX MICROSCOPIC
Bacteria, UA: NONE SEEN
Bilirubin Urine: NEGATIVE
Glucose, UA: NEGATIVE mg/dL
Hgb urine dipstick: NEGATIVE
Ketones, ur: 20 mg/dL — AB
Leukocytes,Ua: NEGATIVE
Nitrite: NEGATIVE
Protein, ur: 30 mg/dL — AB
Specific Gravity, Urine: 1.031 — ABNORMAL HIGH (ref 1.005–1.030)
pH: 5 (ref 5.0–8.0)

## 2022-09-10 LAB — LACTIC ACID, PLASMA
Lactic Acid, Venous: 1.8 mmol/L (ref 0.5–1.9)
Lactic Acid, Venous: 1.4 mmol/L (ref 0.5–1.9)

## 2022-09-10 LAB — COMPREHENSIVE METABOLIC PANEL
ALT: 15 U/L (ref 0–44)
AST: 15 U/L (ref 15–41)
Albumin: 3.7 g/dL (ref 3.5–5.0)
Alkaline Phosphatase: 53 U/L (ref 38–126)
Anion gap: 9 (ref 5–15)
BUN: 34 mg/dL — ABNORMAL HIGH (ref 8–23)
CO2: 25 mmol/L (ref 22–32)
Calcium: 9.3 mg/dL (ref 8.9–10.3)
Chloride: 105 mmol/L (ref 98–111)
Creatinine, Ser: 1.11 mg/dL (ref 0.61–1.24)
GFR, Estimated: >60 mL/min (ref >60)
Glucose, Bld: 94 mg/dL (ref 70–99)
Potassium: 4 mmol/L (ref 3.5–5.1)
Sodium: 139 mmol/L (ref 135–145)
Total Bilirubin: 0.6 mg/dL (ref 0.3–1.2)
Total Protein: 6.3 g/dL — ABNORMAL LOW (ref 6.5–8.1)

## 2022-09-10 LAB — CBC
HCT: 49 % (ref 39.0–52.0)
Hemoglobin: 16.5 g/dL (ref 13.0–17.0)
MCH: 32.4 pg (ref 26.0–34.0)
MCHC: 33.7 g/dL (ref 30.0–36.0)
MCV: 96.3 fL (ref 80.0–100.0)
Platelets: 208 10*3/uL (ref 150–400)
RBC: 5.09 MIL/uL (ref 4.22–5.81)
RDW: 12.7 % (ref 11.5–15.5)
WBC: 9.5 10*3/uL (ref 4.0–10.5)
nRBC: 0 % (ref 0.0–0.2)

## 2022-09-10 LAB — BASIC METABOLIC PANEL
Anion gap: 9 (ref 5–15)
BUN: 34 mg/dL — ABNORMAL HIGH (ref 8–23)
CO2: 25 mmol/L (ref 22–32)
Calcium: 9.6 mg/dL (ref 8.9–10.3)
Chloride: 104 mmol/L (ref 98–111)
Creatinine, Ser: 1.22 mg/dL (ref 0.61–1.24)
GFR, Estimated: >60 mL/min (ref >60)
Glucose, Bld: 132 mg/dL — ABNORMAL HIGH (ref 70–99)
Potassium: 4.1 mmol/L (ref 3.5–5.1)
Sodium: 138 mmol/L (ref 135–145)

## 2022-09-10 LAB — RESP PANEL BY RT-PCR (FLU A&B, COVID) ARPGX2
Influenza A by PCR: NEGATIVE
Influenza B by PCR: NEGATIVE
SARS Coronavirus 2 by RT PCR: NEGATIVE

## 2022-09-10 LAB — TSH: TSH: 1.314 u[IU]/mL (ref 0.350–4.500)

## 2022-09-10 LAB — APTT: aPTT: 34 seconds (ref 24–36)

## 2022-09-10 LAB — PROTIME-INR
INR: 1.5 — ABNORMAL HIGH (ref 0.8–1.2)
Prothrombin Time: 18.1 seconds — ABNORMAL HIGH (ref 11.4–15.2)

## 2022-09-10 MED ORDER — SODIUM CHLORIDE 0.9 % IV BOLUS
1000.0000 mL | Freq: Once | INTRAVENOUS | Status: AC
  Administered 2022-09-10: 1000 mL via INTRAVENOUS

## 2022-09-10 MED ORDER — SODIUM CHLORIDE 0.9 % IV BOLUS (SEPSIS)
1000.0000 mL | Freq: Once | INTRAVENOUS | Status: AC
  Administered 2022-09-10: 1000 mL via INTRAVENOUS

## 2022-09-10 NOTE — Discharge Instructions (Signed)
Do not take your benazepril until you follow-up with your family doctor.  You should be seen within the next 48 hours for a recheck  Drink plenty of clear liquids  Return to the emergency department immediately for severe or worsening symptoms

## 2022-09-10 NOTE — ED Provider Notes (Signed)
[facility]    CSN: [id number] Arrival date & time: 09/09/22  3151      History   Chief Complaint Chief Complaint  Patient presents with   Headache   Weakness    HPI Rodney Scott is a 71 y.o. male.   Patient presenting today with 3-day history of generalized weakness, headache, dizziness, body aches, feeling foggy headed.  He denies known visual change, speech or swallowing issues, extremity weakness numbness or tingling, chest pain, shortness of breath, palpitations, abdominal pain, nausea vomiting diarrhea, upper respiratory symptoms, cough.  So far not trying anything over-the-counter for symptoms.  Feels like he is eating and drinking normally for him.  No known sick contacts recently.  No new medication changes recently.  Complicated past medical history to include hypertension, hyperlipidemia, factor V Leiden with history of DVT and PE, venous insufficiency.  On Xarelto daily for his history of vascular issues.    Past Medical History:  Diagnosis Date   Allergic rhinitis 07/31/2007   Loratadine at times- but mild mental fog on this    BPH associated with nocturia 03/10/2018   Clotting disorder    Current smoker 07/26/2014   Has quit several several times and restarted. Most recently quit 11/06/14. Need to keep a close eye.    Essential hypertension, benign 07/26/2014   Benazepril '20mg'$     Factor V Leiden 07/31/2007   Xarelto '20mg'$  daily. 2 DVT 2001, PE 2011.     History of colonic polyps 07/31/2007   02/2015 adenoma. 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 Patient states thought was external.      Left inguinal hernia 12/05/2016   Declines surgery referral- minimally bothering him. Can call in if he changes his mind   Major depressive disorder in full remission 03/24/2009   Wellbutrin xl '300mg'$ -->'150mg'$ -->  off 03/2020  pristiq '50mg'$ .   In past-->remeron '15mg'$  mainly takes prn for sleep zoloft --> pristiq per sister who is psychiatrist apparetnly   MCI (mild cognitive impairment) 06/15/2015   Osteoarthritis    Right rotator cuff tear 12/07/2010   S/p surgery.     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 COmpression stocking on left. L >R swelling-history of 1st DVT in L.       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 Authorizing Provider  [facility] syringe  07/27/22  Yes [provider]  benazepril (LOTENSIN) 20 MG tablet Take 1 tablet (20 mg total) by mouth daily. 02/19/22   Marin Olp, MD  desvenlafaxine (PRISTIQ) 50 MG 24 hr tablet Take 1 tablet (50 mg total) by mouth daily. 12/05/21   Marin Olp, MD  donepezil (ARICEPT) 10 MG tablet TAKE 1 TABLET BY MOUTH AT BEDTIME 08/22/22 09/06/22  Rondel Jumbo, PA-C  hydrOXYzine (ATARAX) 10 MG tablet Take 1 tablet (10 mg total) by mouth 2 (two) times daily as needed  for itching. May cause drowsiness. 04/22/22   Jeanie Sewer, NP  Loratadine 10 MG CAPS Take by mouth daily.    [provider]  Multiple Vitamin (MULTIVITAMIN) capsule Take by mouth.    [provider]  rosuvastatin (CRESTOR) 40 MG tablet Take 1 tablet by mouth once daily 09/02/22   Marin Olp, MD  XARELTO 20 MG TABS tablet Take 1 tablet by mouth once daily 06/18/22   Marin Olp, MD    Family History Family History  Problem Relation Age of Onset   Memory loss Mother    Colon cancer Father 20   Factor V Leiden deficiency Son    Esophageal cancer Neg Hx    Rectal cancer Neg Hx    Stomach cancer Neg Hx     Social History Social History   Tobacco Use   Smoking status: Every Day    Packs/day: 1.00    Years: 45.00    Total pack years: 45.00    Types: Cigarettes   Smokeless tobacco: Never  Vaping Use   Vaping Use: Never used  Substance Use Topics   Alcohol use: Yes    Comment: rare alcohol consumption   Drug use: No     Allergies   Yellow jacket venom [bee venom]   Review of Systems Review of Systems PER HPI  Physical Exam Triage Vital Signs ED Triage Vitals  Enc Vitals Group     BP 09/09/22 0852 (!) 78/56     Pulse Rate 09/09/22 0852 (!) 106     Resp 09/09/22 0852 20     Temp 09/09/22 0852 97.8 F (36.6 C)     Temp Source 09/09/22 0852 Oral     SpO2 09/09/22 0852 96 %     Weight --      Height --      Head Circumference --      Peak Flow --      Pain Score 09/09/22 0901 0     Pain Loc --      Pain Edu? --      Excl. in [place]? --    No data found.  Updated Vital Signs BP (!) 78/56 (BP Location: Right Arm)   Pulse (!) 106   Temp 97.8 F (36.6 C) (Oral)   Resp 20   SpO2 96%   Visual Acuity Right Eye Distance:   Left Eye Distance:   Bilateral Distance:    Right Eye Near:   Left Eye Near:    Bilateral Near:      Exam abbreviated today as the decision was made early on in visit to go to the emergency department  for further evaluation Physical Exam Vitals and nursing note reviewed.  Constitutional:      Appearance: Normal appearance.  HENT:     Head: Atraumatic.     Mouth/Throat:     Mouth: Mucous membranes are moist.  Eyes:     Extraocular Movements: Extraocular  movements intact.     Conjunctiva/sclera: Conjunctivae normal.  Cardiovascular:     Rate and Rhythm: Regular rhythm. Tachycardia present.  Pulmonary:     Effort: Pulmonary effort is normal.     Breath sounds: Normal breath sounds.  Musculoskeletal:        General: Normal range of motion.     Cervical back: Normal range of motion and neck supple.  Skin:    General: Skin is warm and dry.  Neurological:     Gait: Gait normal.     Comments: Speaking slowly, unclear what his baseline is.  Does appear alert and oriented overall  Psychiatric:        Mood and Affect: Mood normal.        Thought Content: Thought content normal.        Judgment: Judgment normal.      UC Treatments / Results  Labs (all labs ordered are listed, but only abnormal results are displayed) Labs Reviewed - No data to display  EKG   Radiology No results found.  Procedures Procedures (including critical care time)  Medications Ordered in UC Medications - No data to display  Initial Impression / Assessment and Plan / UC Course  I have reviewed the triage vital signs and the nursing notes.  Pertinent labs & imaging results that were available during my care of the patient were reviewed by me and considered in my medical decision making (see chart for details).     Patient hypotensive despite multiple checks including manual throughout his time in clinic, tachycardic but otherwise vital signs within normal limits.  He does appear fatigued, slightly weak, speaking slowly and complains of headache, dizziness and significant generalized weakness.  Discussed strong recommendation for further evaluation in the emergency department given his unstable  vital signs and vague, potentially very dangerous symptoms that.  He is agreeable to this and plans to call his wife to transport him to the emergency department.  Declines EMS transport at this time.  Discussed at length that it was not safe for him to drive himself to the emergency department with his vital signs being this way.  Final Clinical Impressions(s) / UC Diagnoses   Final diagnoses:  Hypotension, unspecified hypotension type  Tachycardia  Generalized weakness  Acute nonintractable headache, unspecified headache type  Dizziness   Discharge Instructions   None    ED Prescriptions   None    PDMP not reviewed this encounter.   Merrie Roof Mark, [place] 09/10/22 [phone]

## 2022-09-10 NOTE — ED Triage Notes (Signed)
Pt presents with generalized malaise x 4 days.Pt was seen yesterday at [facility] and was referred to the ED. Pt denies fevers, N/V/D, cough or URI symptoms.

## 2022-09-10 NOTE — ED Provider Notes (Signed)
[facility] EMERGENCY DEPARTMENT Provider Note   CSN: [id number] Arrival date & time: 09/10/22  1356     History  Chief Complaint  Patient presents with   Fatigue    Rodney Scott is a 71 y.o. male.  HPI   This patient is a 71 year old male, he has a history of hypertension on Lotensin, he has a history of cholesterol on Crestor and has factor V Leiden deficiency taking Xarelto because he has a history of DVT and pulmonary embolism.  He drove himself to the urgent care yesterday because of fatigue, he was also complaining of some headaches and body aches at that time.  He was noted to be both tachycardic and hypotensive based on review of the medical record and they tried to call the paramedics to transport him to the ER but the patient refused at that time.  He presents today with the same symptoms stating that he has just no energy, he does not feel like he is in a pass out he has no chest pain or shortness of breath no coughing, no fever, no nausea vomiting or diarrhea, no blood in the stools, no swelling of the legs, no rashes of the skin, no tick bites, he denies any difficulty speaking.  He is concerned because he continues to feel generally weak.  He was told to come to the ER yesterday but he states he does not know why they wanted him to do that so he did not come but he comes today on his own volition.  Home Medications Prior to Admission medications   Medication Sig Start Date End Date Taking? Authorizing Provider  benazepril (LOTENSIN) 20 MG tablet Take 1 tablet (20 mg total) by mouth daily. 02/19/22  Yes Rodney Olp, MD  desvenlafaxine (PRISTIQ) 50 MG 24 hr tablet Take 1 tablet (50 mg total) by mouth daily. 12/05/21  Yes Rodney Olp, MD  donepezil (ARICEPT) 10 MG tablet TAKE 1 TABLET BY MOUTH AT BEDTIME 08/22/22 09/10/22 Yes Wertman, Rodney Pesa, PA-C  Loratadine 10 MG CAPS Take by mouth daily.   Yes [provider]  Multiple Vitamin (MULTIVITAMIN) capsule Take  by mouth.   Yes [provider]  rosuvastatin (CRESTOR) 40 MG tablet Take 1 tablet by mouth once daily 09/02/22  Yes Rodney Olp, MD  hydrOXYzine (ATARAX) 10 MG tablet Take 1 tablet (10 mg total) by mouth 2 (two) times daily as needed for itching. May cause drowsiness. Patient not taking: Reported on 09/10/2022 04/22/22   Jeanie Sewer, NP  ofloxacin (OCUFLOX) 0.3 % ophthalmic solution Place 1 drop into the right eye 4 (four) times daily. Patient not taking: Reported on 09/10/2022 05/24/22   [provider]  [facility] syringe  07/27/22   [provider]  XARELTO 20 MG TABS tablet Take 1 tablet by mouth once daily 06/18/22   Rodney Olp, MD      Allergies    Yellow jacket venom [bee venom]    Review of Systems   Review of Systems  All other systems reviewed and are negative.   Physical Exam Updated Vital Signs BP 105/61   Pulse 63   Temp 99.1 F (37.3 C) (Oral)   Resp 18   Ht 1.905 m ('6\' 3"'$ )   Wt 99.8 kg   SpO2 100%   BMI 27.50 kg/m  Physical Exam Vitals and nursing note reviewed.  Constitutional:      General: He is not in acute distress.    Appearance:  He is well-developed.  HENT:     Head: Normocephalic and atraumatic.     Mouth/Throat:     Pharynx: No oropharyngeal exudate.  Eyes:     General: No scleral icterus.       Right eye: No discharge.        Left eye: No discharge.     Conjunctiva/sclera: Conjunctivae normal.     Pupils: Pupils are equal, round, and reactive to light.  Neck:     Thyroid: No thyromegaly.     Vascular: No JVD.  Cardiovascular:     Rate and Rhythm: Normal rate and regular rhythm.     Heart sounds: Normal heart sounds. No murmur heard.    No friction rub. No gallop.  Pulmonary:     Effort: Pulmonary effort is normal. No respiratory distress.     Breath sounds: Normal breath sounds. No wheezing or rales.  Abdominal:     General: Bowel sounds are normal. There is no distension.     Palpations:  Abdomen is soft. There is no mass.     Tenderness: There is no abdominal tenderness.  Musculoskeletal:        General: No tenderness. Normal range of motion.     Cervical back: Normal range of motion and neck supple.     Right lower leg: No edema.     Left lower leg: No edema.  Lymphadenopathy:     Cervical: No cervical adenopathy.  Skin:    General: Skin is warm and dry.     Findings: No erythema or rash.  Neurological:     Mental Status: He is alert.     Coordination: Coordination normal.     Comments: Normal cranial nerves II through XII, normal speech, normal coordination, normal finger-nose-finger, normal strength in all 4 extremities.  Psychiatric:        Behavior: Behavior normal.     ED Results / Procedures / Treatments   Labs (all labs ordered are listed, but only abnormal results are displayed) Labs Reviewed  BASIC METABOLIC PANEL - Abnormal; Notable for the following components:      Result Value   Glucose, Bld 132 (*)    BUN 34 (*)    All other components within normal limits  URINALYSIS, ROUTINE W REFLEX MICROSCOPIC - Abnormal; Notable for the following components:   Color, Urine AMBER (*)    APPearance HAZY (*)    Specific Gravity, Urine 1.031 (*)    Ketones, ur 20 (*)    Protein, ur 30 (*)    All other components within normal limits  COMPREHENSIVE METABOLIC PANEL - Abnormal; Notable for the following components:   BUN 34 (*)    Total Protein 6.3 (*)    All other components within normal limits  PROTIME-INR - Abnormal; Notable for the following components:   Prothrombin Time 18.1 (*)    INR 1.5 (*)    All other components within normal limits  RESP PANEL BY RT-PCR (FLU A&B, COVID) ARPGX2  CULTURE, BLOOD (ROUTINE X 2)  CULTURE, BLOOD (ROUTINE X 2)  URINE CULTURE  CBC  LACTIC ACID, PLASMA  LACTIC ACID, PLASMA  APTT  TSH    EKG EKG Interpretation  Date/Time:  Tuesday September 10 2022 15:41:41 EST Ventricular Rate:  71 PR Interval:  168 QRS  Duration: 83 QT Interval:  358 QTC Calculation: 389 R Axis:   58 Text Interpretation: Sinus rhythm Normal ECG Confirmed by Noemi Chapel [phone]) on 09/10/2022 4:00:00 PM  Radiology DG  Chest Port 1 View  Result Date: 09/10/2022 CLINICAL DATA:  Malaise for the past 4 days.  Possible sepsis. EXAM: PORTABLE CHEST 1 VIEW COMPARISON:  CT chest dated June 23, 2010. FINDINGS: The heart size and mediastinal contours are within normal limits. Unchanged mild linear scarring in the peripheral right lower lobe. No focal consolidation, pleural effusion, or pneumothorax. The visualized skeletal structures are unremarkable. IMPRESSION: No active disease. Electronically Signed   By: Titus Dubin M.D.   On: 09/10/2022 15:52    Procedures Procedures    Medications Ordered in ED Medications  sodium chloride 0.9 % bolus 1,000 mL (0 mLs Intravenous Stopped 09/10/22 1642)  sodium chloride 0.9 % bolus 1,000 mL (1,000 mLs Intravenous New Bag/Given 09/10/22 1721)    ED Course/ Medical Decision Making/ A&P                           Medical Decision Making Amount and/or Complexity of Data Reviewed Labs: ordered. Radiology: ordered. ECG/medicine tests: ordered.   This patient presents to the ED for concern of neurolyse fatigue and weakness with abnormal vital signs, this involves an extensive number of treatment options, and is a complaint that carries with it a high risk of complications and morbidity.  The differential diagnosis includes possible sepsis, dehydration, medication overuse, hypothyroidism   Co morbidities that complicate the patient evaluation  History of hypertension, possibly some dementia, the patient is not a great historian   Additional history obtained:  Additional history obtained from electronic medical record External records from outside source obtained and reviewed including urgent care visit yesterday, had last been seen in the office by his family doctor in  September, had also been seen by neurology for his cognitive impairment.  No recent hospitalizations   Lab Tests:  I Ordered, and personally interpreted labs.  The pertinent results include: TSH normal, lactic acid normal, metabolic panel without significant findings, creatinine seems to be baseline, LFTs are normal, TSH is 1.3, CBC shows no anemia, no leukocytosis and the urinalysis reveals small ketones and protein consistent with patient's prior history, he had does appear little dehydrated.  He will need IV fluids which she was given   Imaging Studies ordered:  I ordered imaging studies including chest x-ray I independently visualized and interpreted imaging which showed no acute findings I agree with the radiologist interpretation   Cardiac Monitoring: / EKG:  The patient was maintained on a cardiac monitor.  I personally viewed and interpreted the cardiac monitored which showed an underlying rhythm of: Normal sinus rhythm   Consultations Obtained:  IN basket message to the patient's family doctor, Dr. Yong Channel to establish follow-up for him regarding this visit   Problem List / ED Course / Critical interventions / Medication management  IV fluids for mild dehydration I ordered medication including IV fluids for dehydration Reevaluation of the patient after these medicines showed that the patient improved, in fact the blood pressure has risen up to 105/61, he is afebrile with a pulse of 63 I have reviewed the patients home medicines and have made adjustments as needed   Social Determinants of Health:  Cognitive impairment, mild   Test / Admission - Considered:  Considered admission but the patient is very stable appearing labs are reassuring and he appears well for discharge         Final Clinical Impression(s) / ED Diagnoses Final diagnoses:  Other fatigue    Rx / DC Orders ED Discharge  Orders     None         Noemi Chapel, MD 09/10/22 1746

## 2022-09-11 ENCOUNTER — Encounter (HOSPITAL_COMMUNITY): Payer: Self-pay | Admitting: *Deleted

## 2022-09-11 ENCOUNTER — Other Ambulatory Visit: Payer: Self-pay | Admitting: Family Medicine

## 2022-09-11 ENCOUNTER — Encounter: Payer: Self-pay | Admitting: Gastroenterology

## 2022-09-11 ENCOUNTER — Other Ambulatory Visit: Payer: Self-pay

## 2022-09-11 ENCOUNTER — Emergency Department (HOSPITAL_COMMUNITY)
Admission: EM | Admit: 2022-09-11 | Discharge: 2022-09-11 | Disposition: A | Discharge status: Home / Self Care | Payer: BC Managed Care – PPO | Attending: Emergency Medicine | Admitting: Emergency Medicine

## 2022-09-11 DIAGNOSIS — R799 Abnormal finding of blood chemistry, unspecified: Secondary | ICD-10-CM

## 2022-09-11 DIAGNOSIS — I10 Essential (primary) hypertension: Secondary | ICD-10-CM | POA: Insufficient documentation

## 2022-09-11 DIAGNOSIS — Z79899 Other long term (current) drug therapy: Secondary | ICD-10-CM | POA: Insufficient documentation

## 2022-09-11 DIAGNOSIS — R7881 Bacteremia: Secondary | ICD-10-CM | POA: Insufficient documentation

## 2022-09-11 DIAGNOSIS — Z7901 Long term (current) use of anticoagulants: Secondary | ICD-10-CM | POA: Diagnosis not present

## 2022-09-11 DIAGNOSIS — F17210 Nicotine dependence, cigarettes, uncomplicated: Secondary | ICD-10-CM | POA: Diagnosis not present

## 2022-09-11 DIAGNOSIS — R7989 Other specified abnormal findings of blood chemistry: Secondary | ICD-10-CM | POA: Diagnosis present

## 2022-09-11 LAB — URINALYSIS, ROUTINE W REFLEX MICROSCOPIC
Bilirubin Urine: NEGATIVE
Glucose, UA: NEGATIVE mg/dL
Hgb urine dipstick: NEGATIVE
Ketones, ur: 20 mg/dL — AB
Leukocytes,Ua: NEGATIVE
Nitrite: NEGATIVE
Protein, ur: NEGATIVE mg/dL
Specific Gravity, Urine: 1.02 (ref 1.005–1.030)
pH: 5 (ref 5.0–8.0)

## 2022-09-11 LAB — SEDIMENTATION RATE: Sed Rate: 7 mm/hr (ref 0–16)

## 2022-09-11 LAB — URINE CULTURE: Culture: NO GROWTH

## 2022-09-11 LAB — COMPREHENSIVE METABOLIC PANEL
ALT: 14 U/L (ref 0–44)
AST: 16 U/L (ref 15–41)
Albumin: 3.4 g/dL — ABNORMAL LOW (ref 3.5–5.0)
Alkaline Phosphatase: 51 U/L (ref 38–126)
Anion gap: 9 (ref 5–15)
BUN: 31 mg/dL — ABNORMAL HIGH (ref 8–23)
CO2: 26 mmol/L (ref 22–32)
Calcium: 9.4 mg/dL (ref 8.9–10.3)
Chloride: 106 mmol/L (ref 98–111)
Creatinine, Ser: 1.02 mg/dL (ref 0.61–1.24)
GFR, Estimated: >60 mL/min (ref >60)
Glucose, Bld: 92 mg/dL (ref 70–99)
Potassium: 4.4 mmol/L (ref 3.5–5.1)
Sodium: 141 mmol/L (ref 135–145)
Total Bilirubin: 0.7 mg/dL (ref 0.3–1.2)
Total Protein: 6.2 g/dL — ABNORMAL LOW (ref 6.5–8.1)

## 2022-09-11 LAB — CBC WITH DIFFERENTIAL/PLATELET
Abs Immature Granulocytes: 0.03 10*3/uL (ref 0.00–0.07)
Basophils Absolute: 0.1 10*3/uL (ref 0.0–0.1)
Basophils Relative: 1 %
Eosinophils Absolute: 0.1 10*3/uL (ref 0.0–0.5)
Eosinophils Relative: 2 %
HCT: 46.2 % (ref 39.0–52.0)
Hemoglobin: 15.4 g/dL (ref 13.0–17.0)
Immature Granulocytes: 0 %
Lymphocytes Relative: 26 %
Lymphs Abs: 2.1 10*3/uL (ref 0.7–4.0)
MCH: 32.1 pg (ref 26.0–34.0)
MCHC: 33.3 g/dL (ref 30.0–36.0)
MCV: 96.3 fL (ref 80.0–100.0)
Monocytes Absolute: 1 10*3/uL (ref 0.1–1.0)
Monocytes Relative: 12 %
Neutro Abs: 4.9 10*3/uL (ref 1.7–7.7)
Neutrophils Relative %: 59 %
Platelets: 178 10*3/uL (ref 150–400)
RBC: 4.8 MIL/uL (ref 4.22–5.81)
RDW: 12.6 % (ref 11.5–15.5)
WBC: 8.2 10*3/uL (ref 4.0–10.5)
nRBC: 0 % (ref 0.0–0.2)

## 2022-09-11 LAB — LACTIC ACID, PLASMA
Lactic Acid, Venous: 1.6 mmol/L (ref 0.5–1.9)
Lactic Acid, Venous: 0.9 mmol/L (ref 0.5–1.9)

## 2022-09-11 LAB — C-REACTIVE PROTEIN: CRP: 0.5 mg/dL (ref <1.0)

## 2022-09-11 MED ORDER — CEFTRIAXONE SODIUM 1 G IJ SOLR
1.0000 g | Freq: Once | INTRAMUSCULAR | Status: AC
  Administered 2022-09-11: 1 g via INTRAMUSCULAR
  Filled 2022-09-11: qty 10

## 2022-09-11 MED ORDER — SODIUM CHLORIDE 0.9 % IV BOLUS
1000.0000 mL | Freq: Once | INTRAVENOUS | Status: AC
  Administered 2022-09-11: 1000 mL via INTRAVENOUS

## 2022-09-11 NOTE — Discharge Instructions (Signed)
We evaluated you for your positive blood cultures.  Your testing in the emergency department was reassuring and overall negative.  We discussed staying in the hospital versus going home.  Since you feel well, you preferred to go home.  We have given you a dose of antibiotics in the emergency department.  We will call you if your blood cultures come back positive again or any other bacteria grow from them.  I think it is most likely that this was a contaminant.  Please come back to the hospital if you experience any worsening symptoms like feeling very weak, nausea or vomiting, racing heartbeat, fevers or chills, abdominal pain, or any symptom that is concerning

## 2022-09-11 NOTE — ED Triage Notes (Signed)
Pt was seen here yesterday and had blood cultures drawn that came back positive  Pt originally came here cause urgent care sent him here due to generalized malaise x 4 days  Pt states he feels a little better today

## 2022-09-11 NOTE — ED Provider Notes (Signed)
[facility] EMERGENCY DEPARTMENT Provider Note  CSN: [id number] Arrival date & time: 09/11/22 4098  Chief Complaint(s) Abnormal Lab  HPI Rodney Scott is a 71 y.o. male history of factor V Leiden on Xarelto, hypertension, hyperlipidemia, current smoker presenting with abnormal lab.  Patient reports since Sunday he has had significant weakness and fatigue.  He first went to an urgent care Monday where they noted his blood pressure was low and he had a fast heart rate, was advised to come to the emergency department but did not come until yesterday.  Yesterday he came to the emergency department and had a fast heart rate and mildly low blood pressure which improved with fluids.  His testing was overall reassuring and he was discharged with a blood cultures were obtained.  Blood cultures turned with gram-positive rods.  Patient reports mild ongoing fatigue but reports that overall he feels a lot better than when this began.  Has not been on any antibiotics..  He denies any recent fevers or chills.  He denies any nausea, vomiting, abdominal pain, dysuria, chest pain, cough, headaches, neck stiffness.  Denies IV drug use or any recent infusions.  Does not have any central access.   Past Medical History Past Medical History:  Diagnosis Date   Allergic rhinitis 07/31/2007   Loratadine at times- but mild mental fog on this    BPH associated with nocturia 03/10/2018   Clotting disorder    Current smoker 07/26/2014   Has quit several several times and restarted. Most recently quit 11/06/14. Need to keep a close eye.    Essential hypertension, benign 07/26/2014   Benazepril 56m    Factor V Leiden 07/31/2007   Xarelto 223mdaily. 2 DVT 2001, PE 2011.     History of colonic polyps 07/31/2007   02/2015 adenoma. 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 Patient states thought was external.      Left inguinal hernia 12/05/2016   Declines surgery referral- minimally bothering him. Can call in if he changes his mind   Major depressive disorder in full remission 03/24/2009   Wellbutrin xl 30045m>178mg--> off 03/2020  pristiq 78m74m In past-->remeron 15mg9mnly takes prn for sleep zoloft --> pristiq per sister who is psychiatrist apparetnly   MCI (mild cognitive impairment) 06/15/2015   Osteoarthritis    Right rotator cuff tear 12/07/2010   S/p surgery.     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 COmpression stocking on left. L >R swelling-history of 1st DVT in L.      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 Authorizing Provider  benazepril (LOTENSIN) 20 MG tablet Take 1 tablet (20 mg total) by mouth daily. 02/19/22  Yes Marin Olp, MD  desvenlafaxine (PRISTIQ) 50 MG 24 hr tablet Take 1 tablet (50 mg total) by mouth daily. 12/05/21  Yes Marin Olp, MD  donepezil (ARICEPT) 10 MG tablet TAKE 1 TABLET BY MOUTH AT BEDTIME 08/22/22 09/11/22 Yes Wertman, Coralee Pesa, PA-C  Loratadine  10 MG CAPS Take by mouth daily.   Yes [provider]  Multiple Vitamin (MULTIVITAMIN) capsule Take by mouth.   Yes [provider]  rosuvastatin (CRESTOR) 40 MG tablet Take 1 tablet by mouth once daily 09/02/22  Yes Marin Olp, MD  XARELTO 20 MG TABS tablet Take 1 tablet by mouth once daily 09/11/22  Yes Marin Olp, MD  hydrOXYzine (ATARAX) 10 MG tablet Take 1 tablet (10 mg total) by mouth 2 (two) times daily as needed for itching. May cause drowsiness. Patient not taking: Reported on 09/10/2022 04/22/22   Jeanie Sewer, NP  ofloxacin (OCUFLOX) 0.3 % ophthalmic solution Place 1 drop into the right eye 4 (four) times daily. Patient not taking: Reported on 09/10/2022 05/24/22   [provider]  [facility] syringe  07/27/22   [provider]                                                                                                                                    Past Surgical History Past Surgical History:  Procedure Laterality Date   right rotator cuff repair     SALIVARY STONE REMOVAL     Family History Family History  Problem Relation Age of Onset   Memory loss Mother    Colon cancer Father 81   Factor V Leiden deficiency Son    Esophageal cancer Neg Hx    Rectal cancer Neg Hx    Stomach cancer Neg Hx     Social History Social History   Tobacco Use   Smoking status: Every Day    Packs/day: 1.00    Years: 45.00    Total pack years: 45.00    Types: Cigarettes   Smokeless tobacco: Never  Vaping Use   Vaping Use: Never used  Substance Use Topics   Alcohol use: Yes    Comment: rare alcohol consumption   Drug use: No   Allergies Yellow jacket venom [bee venom]  Review of Systems Review of Systems  All other systems reviewed and are negative.   Physical Exam Vital Signs  I have reviewed the triage vital signs BP 120/79   Pulse 63   Temp 98.1 F (36.7 C) (Oral)   Resp 20   Ht _0  (1.905 m)   Wt 99.8 kg    SpO2 98%   BMI 27.50 kg/m  Physical Exam Vitals and nursing note reviewed.  Constitutional:  General: He is not in acute distress.    Appearance: Normal appearance.  HENT:     Mouth/Throat:     Mouth: Mucous membranes are moist.  Eyes:     Conjunctiva/sclera: Conjunctivae normal.  Cardiovascular:     Rate and Rhythm: Normal rate and regular rhythm.     Heart sounds: No murmur heard. Pulmonary:     Effort: Pulmonary effort is normal. No respiratory distress.     Breath sounds: Normal breath sounds.  Abdominal:     General: Abdomen is flat.     Palpations: Abdomen is soft.     Tenderness: There is no abdominal tenderness.  Musculoskeletal:     Right lower leg: No edema.     Left lower leg: No edema.  Skin:    General: Skin is warm and dry.     Capillary Refill: Capillary refill takes less than 2 seconds.  Neurological:     Mental Status: He is alert and oriented to person, place, and time. Mental status is at baseline.  Psychiatric:        Mood and Affect: Mood normal.        Behavior: Behavior normal.     ED Results and Treatments Labs (all labs ordered are listed, but only abnormal results are displayed) Labs Reviewed  COMPREHENSIVE METABOLIC PANEL - Abnormal; Notable for the following components:      Result Value   BUN 31 (*)    Total Protein 6.2 (*)    Albumin 3.4 (*)    All other components within normal limits  URINALYSIS, ROUTINE W REFLEX MICROSCOPIC - Abnormal; Notable for the following components:   APPearance HAZY (*)    Ketones, ur 20 (*)    All other components within normal limits  CULTURE, BLOOD (ROUTINE X 2)  CULTURE, BLOOD (ROUTINE X 2)  URINE CULTURE  CBC WITH DIFFERENTIAL/PLATELET  LACTIC ACID, PLASMA  LACTIC ACID, PLASMA  SEDIMENTATION RATE  C-REACTIVE PROTEIN                                                                                                                          Radiology DG Chest Port 1 View  Result Date:  09/10/2022 CLINICAL DATA:  Malaise for the past 4 days.  Possible sepsis. EXAM: PORTABLE CHEST 1 VIEW COMPARISON:  CT chest dated June 23, 2010. FINDINGS: The heart size and mediastinal contours are within normal limits. Unchanged mild linear scarring in the peripheral right lower lobe. No focal consolidation, pleural effusion, or pneumothorax. The visualized skeletal structures are unremarkable. IMPRESSION: No active disease. Electronically Signed   By: Titus Dubin M.D.   On: 09/10/2022 15:52    Pertinent labs & imaging results that were available during my care of the patient were reviewed by me and considered in my medical decision making (see MDM for details).  Medications Ordered in ED Medications  cefTRIAXone (ROCEPHIN) injection 1 g (has no administration in time range)  sodium chloride 0.9 %  bolus 1,000 mL (0 mLs Intravenous Stopped 09/11/22 1054)                                                                                                                                     Procedures Procedures  (including critical care time)  Medical Decision Making / ED Course   MDM:  71 year old male presenting to the emergency department with abnormal lab.  Reviewed blood culture data and previous records.  Patient overall well-appearing and his exam is unremarkable.  Although patient is well-appearing, reviewed previous records which show that he had hypotension Monday, Tuesday with tachycardia.  Also appears he was complaining of bodyaches.  If patient is bacteremic the source is unclear as he is almost no symptoms.  Will obtain repeat laboratory testing and obtain repeat blood cultures.  Patient is very well-appearing today and he reports his symptoms have all improved and he has not been on antibiotics, so if his workup is all entirely reassuring may still be able to be discharged.  Clinical Course as of 09/11/22 1240  Wed Sep 11, 2022  1236 Workup essentially normal.  No  elevated WBC count.  ESR and CRP negative.  Normal lactate.  Urinalysis reassuring.  Patient has had normal vitals the whole time he has been here.  Given extremely reassuring workup and patient feeling mostly at baseline, suspect blood culture could have been contaminant, only 1 of 2 cultures returned positive.  Discussed admission versus discharge with the patient.  Offered admission given age and earlier symptoms, he reports that since his symptoms have already improved without treatment and he currently feels well, he would much rather prefer to be discharged and request discharge.  Will discharge but will cover with ceftriaxone.  Repeat cultures were obtained today.  Patient knows that he may still be called back if his culture again returns positive or any other growth occurs. [WS]    Clinical Course User Index [WS] Cristie Hem, MD     Additional history obtained: -Additional history obtained from family -External records from outside source obtained and reviewed including: Chart review including previous notes, labs, imaging, consultation notes including ED visit yesterday   Lab Tests: -I ordered, reviewed, and interpreted labs.   The pertinent results include:   Labs Reviewed  COMPREHENSIVE METABOLIC PANEL - Abnormal; Notable for the following components:      Result Value   BUN 31 (*)    Total Protein 6.2 (*)    Albumin 3.4 (*)    All other components within normal limits  URINALYSIS, ROUTINE W REFLEX MICROSCOPIC - Abnormal; Notable for the following components:   APPearance HAZY (*)    Ketones, ur 20 (*)    All other components within normal limits  CULTURE, BLOOD (ROUTINE X 2)  CULTURE, BLOOD (ROUTINE X 2)  URINE CULTURE  CBC WITH DIFFERENTIAL/PLATELET  LACTIC ACID, PLASMA  LACTIC ACID, PLASMA  SEDIMENTATION RATE  C-REACTIVE PROTEIN    Notable for normal ESR, CRP, WBC   Medicines ordered and prescription drug management: Meds ordered this encounter   Medications   sodium chloride 0.9 % bolus 1,000 mL   cefTRIAXone (ROCEPHIN) injection 1 g    Order Specific Question:   Antibiotic Indication:    Answer:   Bacteremia    -I have reviewed the patients home medicines and have made adjustments as needed  Co morbidities that complicate the patient evaluation  Past Medical History:  Diagnosis Date   Allergic rhinitis 07/31/2007   Loratadine at times- but mild mental fog on this    BPH associated with nocturia 03/10/2018   Clotting disorder    Current smoker 07/26/2014   Has quit several several times and restarted. Most recently quit 11/06/14. Need to keep a close eye.    Essential hypertension, benign 07/26/2014   Benazepril 41m    Factor V Leiden 07/31/2007   Xarelto 292mdaily. 2 DVT 2001, PE 2011.     History of colonic polyps 07/31/2007   02/2015 adenoma. 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 Patient states thought was external.      Left inguinal hernia 12/05/2016   Declines surgery referral- minimally bothering him. Can call in if he changes his mind   Major depressive disorder in full remission 03/24/2009   Wellbutrin xl 30015m>156mg--> off 03/2020  pristiq 56m47m In past-->remeron 15mg77mnly takes prn for sleep zoloft --> pristiq per sister who is psychiatrist apparetnly   MCI (mild cognitive impairment) 06/15/2015   Osteoarthritis    Right rotator cuff tear 12/07/2010   S/p surgery.     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 COmpression stocking on left. L >R swelling-history of 1st DVT in L.         Dispostion: Disposition decision including need for hospitalization was considered, and patient discharged from emergency  department.    Final Clinical Impression(s) / ED Diagnoses Final diagnoses:  Contamination of blood culture  Blood bacterial culture positive     This chart was dictated using voice recognition software.  Despite best efforts to proofread,  errors can occur which can change the documentation meaning.    SchevCristie Hem11/29/23 1240

## 2022-09-12 ENCOUNTER — Ambulatory Visit: Payer: BC Managed Care – PPO | Admitting: Gastroenterology

## 2022-09-12 LAB — URINE CULTURE: Culture: NO GROWTH

## 2022-09-12 LAB — CULTURE, BLOOD (ROUTINE X 2)

## 2022-09-13 ENCOUNTER — Ambulatory Visit: Payer: BC Managed Care – PPO | Admitting: Family Medicine

## 2022-09-13 ENCOUNTER — Ambulatory Visit (INDEPENDENT_AMBULATORY_CARE_PROVIDER_SITE_OTHER): Payer: BC Managed Care – PPO | Admitting: Family Medicine

## 2022-09-13 ENCOUNTER — Encounter: Payer: Self-pay | Admitting: Family Medicine

## 2022-09-13 ENCOUNTER — Telehealth (HOSPITAL_BASED_OUTPATIENT_CLINIC_OR_DEPARTMENT_OTHER): Payer: Self-pay | Admitting: *Deleted

## 2022-09-13 VITALS — BP 112/65 | HR 82 | Temp 98.8°F | Ht 75.0 in | Wt 222.0 lb

## 2022-09-13 DIAGNOSIS — E785 Hyperlipidemia, unspecified: Secondary | ICD-10-CM

## 2022-09-13 DIAGNOSIS — D6851 Activated protein C resistance: Secondary | ICD-10-CM | POA: Diagnosis not present

## 2022-09-13 DIAGNOSIS — F17200 Nicotine dependence, unspecified, uncomplicated: Secondary | ICD-10-CM | POA: Diagnosis not present

## 2022-09-13 DIAGNOSIS — E860 Dehydration: Secondary | ICD-10-CM | POA: Diagnosis not present

## 2022-09-13 DIAGNOSIS — I10 Essential (primary) hypertension: Secondary | ICD-10-CM

## 2022-09-13 DIAGNOSIS — F3342 Major depressive disorder, recurrent, in full remission: Secondary | ICD-10-CM

## 2022-09-13 DIAGNOSIS — G3184 Mild cognitive impairment, so stated: Secondary | ICD-10-CM | POA: Diagnosis not present

## 2022-09-13 NOTE — Progress Notes (Signed)
Phone [phone] In person visit   Subjective:   Rodney Scott is a 71 y.o. 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    Priority: High   BPH associated with nocturia 03/10/2018    Priority: Medium    Hyperglycemia 12/05/2016    Priority: Medium    Essential hypertension, benign 07/26/2014    Priority: Medium    Major depressive disorder in full remission 03/24/2009    Priority: Medium    Hyperlipidemia 07/31/2007    Priority: Medium    Senile purpura 09/22/2020    Priority: Low   Left inguinal hernia 12/05/2016    Priority: Low   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 90 tablet 3   desvenlafaxine (PRISTIQ) 50 MG 24 hr tablet Take 1 tablet (50 mg total) by mouth daily. 90 tablet 3   donepezil (ARICEPT) 10 MG tablet Take 10 mg by mouth at bedtime.     Loratadine 10 MG CAPS Take by mouth daily.     Multiple Vitamin (MULTIVITAMIN) capsule Take by mouth.     rosuvastatin (CRESTOR) 40 MG tablet Take 1 tablet by mouth once daily 90 tablet 0   XARELTO 20 MG TABS tablet Take 1 tablet by mouth once daily 90 tablet 0   No current  facility-administered medications for this visit.     Objective:  BP 112/65 (BP Location: Right Arm, Patient Position: Sitting)   Pulse 82   Temp 98.8 F (37.1 C) (Temporal)   Ht '6\' 3"'$  (1.905 m)   Wt 222 lb (100.7 kg)   SpO2 96%   BMI 27.75 kg/m  Gen: NAD, resting comfortably CV: RRR no murmurs rubs or gallops Lungs: CTAB no crackles, wheeze, rhonchi Ext: trace edema (not wearing compression hose like he has in the past- doesn't like putting them on) Skin: warm, dry Neuro: able to answer most questions- we still signed proxy for wife access to mychart    Assessment and Plan   # ED follow-up S: Patient was seen in urgent care earlier this week and had slightly low blood pressure and slight tachycardia.  Presented to the emergency department the following day on 09/10/22 with fatigue and no other symptoms.  He was given some IV fluids for borderline blood pressure and plan was to hold benazepril until following up with me.  ED physician Dr. Sabra Heck was kind enough to reach out to have our office schedule follow-up for him (particularly in light of mild cognitive impairment). Negative respiratory viral panel  Lab work was reassuring including normal TSH, lactic acid, creatinine, liver function, CBC  without anemia or leukocytosis.  Urinalysis with small ketones and protein consistent with prior checks-did appear mildly dehydrated.  Chest x-ray was reassuring.  Cardiac monitoring without any abnormal underlying rhythm-appear to be in sinus rhythm  He was called back the following day due to an abnormal lab with positive blood culture.  They also mentioned possible body aches on this visit-patient reported symptoms had improved by time of that visit.  Sedimentation rate and CRP were not elevated and lactic acid was reassuring again.  Urinalysis was reassuring on this trip.  It was thought that blood culture 1 of 2 was contaminated (grew Bacillus species)-option of admission versus discharge was  given the patient-he opted for discharge-they did cover with a dose of ceftriaxone and repeated blood cultures which have been negative to date.  No outpatient antibiotics were given A/P: Patient seems to have been dehydrated-I think this was causing him to have fatigue and not feeling overall well-improved with IV fluids and holding blood pressure medicine.  Blood pressure does seem to still be on the lower end-see hypertension section for plan -Encouraged him to remain well-hydrated.  Also have some venous insufficiency-encouraged him to wear compression stockings  - Did have a call back to the emergency department for positive blood culture but this appears to have been a contaminant-repeat blood culture was negative so far to date and only 1 of 2 initial blood cultures were positive - Patient should return to care if new or worsening symptoms  # Mild cognitive impairment-patient remains on donepezil-team erroneously remove this from chart but they added it back in manually-we discussed continuing current medication.  I am thankful for Dr. Martinique of the emergency department helping coordinate care for patient with mild cognitive impairment - We also signed proxy forms for patient's wife to be able to access the chart on his behalf-she was not present today but sent the paperwork   # Factor V deficiency-has been well maintained/controlled without DVT/PE on Xarelto 20 mg-continue current medication -Has a fair amount of bruising with this as he is very active-requests to take every other day and I advised against  #Current smoker- not ready to quit but strongly encouraged.   #hypertension S: medication: benazepril 20 mg currently on hold-no current medication BP Readings from Last 3 Encounters:  09/13/22 112/65  09/11/22 117/85  09/10/22 (!) 125/110  A/P: Blood pressure running low normal even without benazepril- From AVS "  Patient Instructions  Stay off benazepril 20 mg  Come to see me  again in 2-3 weeks and let me recheck blood pressure and make sure has not elevated- for now it seems you do not need the medicine- if I restart it could run pressure too low at current time - another idea if you would monitor blood pressure is to check daily over next 2-3 weeks and then update me with readings and I can tell you whether to restart or not  Recommended follow up: Return in about 2 weeks (around 09/27/2022) for followup or sooner if needed.Schedule b4 you leave. "   #hyperlipidemia S: Medication: Rosuvastatin 40 mg Lab Results  Component Value Date   CHOL 157 06/14/2022   HDL 46.30 06/14/2022   LDLCALC 84 06/14/2022   LDLDIRECT 86.0 09/13/2019   TRIG 134.0 06/14/2022   CHOLHDL 3 06/14/2022   A/P: Reasonably well-controlled for age-continue current medication  # Depression S: Medication:desvenlafaxine 50 mg     09/13/2022    4:05 PM 04/22/2022    4:20 PM  12/05/2021   10:45 AM  Depression screen PHQ 2/9  Decreased Interest 0 1 0  Down, Depressed, Hopeless 0 2 0  PHQ - 2 Score 0 3 0  Altered sleeping 0 1 0  Tired, decreased energy 0 1 0  Change in appetite 0 0 0  Feeling bad or failure about yourself  0 1 1  Trouble concentrating 0 0 1  Moving slowly or fidgety/restless 0 0 0  Suicidal thoughts 0 0 0  PHQ-9 Score 0 6 2  Difficult doing work/chores Not difficult at all Somewhat difficult Not difficult at all   A/P: Depression appears well-controlled-continue current medication   Recommended follow up: Return in about 2 weeks (around 09/27/2022) for followup or sooner if needed.Schedule b4 you leave. Future Appointments  Date Time Provider [place]  09/26/2022  3:20 PM Marin Olp, MD [facility]  10/18/2022  9:30 AM Zehr, Laban Emperor, PA-C [facility]  11/29/2022  8:45 AM [facility] HEALTH COACH [facility]  12/06/2022  9:30 AM Rondel Jumbo, PA-C [facility] None  12/13/2022  2:20 PM Yong Channel Brayton Mars, MD [facility]   Lab/Order associations:    ICD-10-CM   1. Dehydration  E86.0     2. Essential hypertension, benign  I10     3. Hyperlipidemia, unspecified hyperlipidemia type  E78.5     4. MCI (mild cognitive impairment)  G31.84     5. Current smoker  F17.200     6. Factor V Leiden ([place])  D68.51     7. Recurrent major depressive disorder, in full remission ([place])  F33.42      Return precautions advised.  [name], MD

## 2022-09-13 NOTE — Patient Instructions (Addendum)
Stay off benazepril 20 mg  Come to see me again in 2-3 weeks and let me recheck blood pressure and make sure has not elevated- for now it seems you do not need the medicine- if I restart it could run pressure too low at current time - another idea if you would monitor blood pressure is to check daily over next 2-3 weeks and then update me with readings and I can tell you whether to restart or not  Recommended follow up: Return in about 2 weeks (around 09/27/2022) for followup or sooner if needed.Schedule b4 you leave.

## 2022-09-13 NOTE — Telephone Encounter (Signed)
Post ED Visit - Positive Culture Follow-up  Culture report reviewed by antimicrobial stewardship pharmacist: [place] Team '[]'$  Elenor Quinones, Pharm.D. '[]'$  Heide Guile, Pharm.D., BCPS AQ-ID '[]'$  Parks Neptune, Pharm.D., BCPS '[]'$  Alycia Rossetti, Pharm.D., BCPS '[]'$  [place], Pharm.D., BCPS, AAHIVP '[]'$  Legrand Como, Pharm.D., BCPS, AAHIVP '[]'$  Salome Arnt, PharmD, BCPS '[]'$  Johnnette Gourd, PharmD, BCPS '[]'$  Hughes Better, PharmD, BCPS '[]'$  Leeroy Cha, PharmD '[]'$  Laqueta Linden, PharmD, BCPS '[]'$  Albertina Parr, PharmD  [place] Team '[]'$  Leodis Sias, PharmD '[]'$  Lindell Spar, PharmD '[]'$  Royetta Asal, PharmD '[]'$  Graylin Shiver, Rph '[]'$  Rema Fendt) Glennon Mac, PharmD '[]'$  Arlyn Dunning, PharmD '[]'$  Netta Cedars, PharmD '[]'$  Dia Sitter, PharmD '[]'$  Leone Haven, PharmD '[]'$  Gretta Arab, PharmD '[]'$  Theodis Shove, PharmD '[]'$  Peggyann Juba, PharmD '[]'$  Reuel Boom, PharmD   Positive blood culture Repeat blood cutures noegative,  and no further patient follow-up is required at this time  Bertis Ruddy, [place] 09/13/2022, 10:25 AM

## 2022-09-15 LAB — CULTURE, BLOOD (ROUTINE X 2)
Culture: NO GROWTH
Special Requests: ADEQUATE

## 2022-09-16 ENCOUNTER — Encounter: Payer: Self-pay | Admitting: Physician Assistant

## 2022-09-16 LAB — CULTURE, BLOOD (ROUTINE X 2)
Culture: NO GROWTH
Culture: NO GROWTH
Special Requests: ADEQUATE

## 2022-09-25 ENCOUNTER — Encounter: Payer: Self-pay | Admitting: Family Medicine

## 2022-09-26 ENCOUNTER — Encounter: Payer: Self-pay | Admitting: Family Medicine

## 2022-09-26 ENCOUNTER — Telehealth: Payer: Self-pay

## 2022-09-26 ENCOUNTER — Ambulatory Visit (INDEPENDENT_AMBULATORY_CARE_PROVIDER_SITE_OTHER): Payer: BC Managed Care – PPO | Admitting: Family Medicine

## 2022-09-26 VITALS — BP 110/68 | HR 77 | Temp 98.0°F | Ht 75.0 in | Wt 219.8 lb

## 2022-09-26 DIAGNOSIS — F3342 Major depressive disorder, recurrent, in full remission: Secondary | ICD-10-CM

## 2022-09-26 DIAGNOSIS — I10 Essential (primary) hypertension: Secondary | ICD-10-CM | POA: Diagnosis not present

## 2022-09-26 DIAGNOSIS — G3184 Mild cognitive impairment, so stated: Secondary | ICD-10-CM | POA: Diagnosis not present

## 2022-09-26 DIAGNOSIS — E785 Hyperlipidemia, unspecified: Secondary | ICD-10-CM | POA: Diagnosis not present

## 2022-09-26 MED ORDER — DESVENLAFAXINE SUCCINATE ER 25 MG PO TB24: 25.0000 mg | ORAL_TABLET | Freq: Every day | ORAL | 3 refills | Status: DC

## 2022-09-26 NOTE — Patient Instructions (Addendum)
Tdap (tetanus shot)- recommend at the pharmacy  Blood pressure controlled off medicine- stay off benazepril- remove from medication list -wife will place in separate area at home so he does not take  Lets trial desvenlafaxine/pristiq at lower dose 25 mg to see if maybe medicine is causing some of the tiredness/sleepiness especially with reassuring labs recently. If symptoms worsen- lets  sit back down  Recommended follow up: Return in about 2 months (around 11/27/2022) for followup or sooner if needed.Schedule b4 you leave. Can reschedule visit at the desk from march

## 2022-09-26 NOTE — Telephone Encounter (Signed)
        Patient  visited [facility] on 09/11/2022  for contamination of blood culture.   Telephone encounter attempt :  1st  A HIPAA compliant voice message was left requesting a return call.  Instructed patient to call back at [phone].   [place] Resource Care Guide   [email or web address]  ?? [phone]   Website: [email or web address]  [email or web address]

## 2022-09-26 NOTE — Progress Notes (Signed)
Phone [phone] In person visit   Subjective:   Rodney Scott is a 71 y.o. year old very pleasant male patient who presents for/with See problem oriented charting Chief Complaint  Patient presents with   Follow-up    See mychart message per wifes concerns.    Past Medical History-  Patient Active Problem List   Diagnosis Date Noted   MCI (mild cognitive impairment) 06/15/2015    Priority: High   Current smoker 07/26/2014    Priority: High   Factor V Leiden ([place]) 07/31/2007    Priority: High   History of venous thrombosis and embolism 07/31/2007    Priority: High   BPH associated with nocturia 03/10/2018    Priority: Medium    Hyperglycemia 12/05/2016    Priority: Medium    Essential hypertension, benign 07/26/2014    Priority: Medium    Major depressive disorder in full remission 03/24/2009    Priority: Medium    Hyperlipidemia 07/31/2007    Priority: Medium    Senile purpura 09/22/2020    Priority: Low   Left inguinal hernia 12/05/2016    Priority: Low   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 90 tablet 3   desvenlafaxine (PRISTIQ) 50 MG 24 hr tablet Take 1 tablet (50 mg total) by mouth daily. 90 tablet 3   donepezil (ARICEPT) 10 MG tablet Take 10 mg by mouth at bedtime.     Loratadine 10 MG CAPS Take by mouth daily.     Multiple Vitamin (MULTIVITAMIN) capsule Take by mouth.     rosuvastatin (CRESTOR) 40 MG tablet Take 1 tablet by mouth once daily 90 tablet 0   XARELTO 20 MG TABS tablet Take 1 tablet by mouth once daily 90 tablet 0   No  current facility-administered medications for this visit.     Objective:  BP 110/68   Pulse 77   Temp 98 F (36.7 C)   Ht '6\' 3"'$  (1.905 m)   Wt 219 lb 12.8 oz (99.7 kg)   SpO2 95%   BMI 27.47 kg/m  Gen: NAD, resting comfortably CV: RRR no murmurs rubs or gallops Lungs: CTAB no crackles, wheeze, rhonchi Ext: no edema Neuro: looks to wife for some answers    Assessment and Plan   #Mild cognitive impairment S: was started on Aricept 10 mg by Dr. Delice Lesch and Sherrilyn Rist, PA. He is doing well with this medicine - from mychart " During this past eight months, I have observed that Oluwatomisin sleeps a lot.  He sleeps at least 8 hrs during the week, but when he does not have to get up for work, it is more like 10 to 12 hours per night with naps during the day" -usually 8 hours in week today he reports but 12 on weekends -on weekends also naps in morning or afternoon. Prolonged naps at ime.  -no snoring -also goes to bed early - wife reports he feels more tired in the day in last 8 months or so. No chest pain or shortness of breath  A/P: ongoing mild  cognitive impairment but overall stable - continue current medications . With that being said would like for him to limit his time in the bed and limit naps to 20 mins before 3 pm ideally- wife agrees and likely limit time in bed to 10 hours or so  #hypertension S: medication: no rx- off benazepril still Home readings #s: 1110s BP Readings from Last 3 Encounters:  09/26/22 110/68  09/13/22 112/65  09/11/22 117/85  A/P: controlled off medicine- stay off benazepril- remove from medication list -wife will place in separate area at home so he does not take  # Depression S: Medication:pristiq '50mg'$      09/13/2022    4:05 PM 04/22/2022    4:20 PM 12/05/2021   10:45 AM  Depression screen PHQ 2/9  Decreased Interest 0 1 0  Down, Depressed, Hopeless 0 2 0  PHQ - 2 Score 0 3 0  Altered sleeping 0 1 0  Tired, decreased energy 0 1 0  Change in  appetite 0 0 0  Feeling bad or failure about yourself  0 1 1  Trouble concentrating 0 0 1  Moving slowly or fidgety/restless 0 0 0  Suicidal thoughts 0 0 0  PHQ-9 Score 0 6 2  Difficult doing work/chores Not difficult at all Somewhat difficult Not difficult at all   A/P: appears well controlled per phq9. wonder if fatigue/sleepiness could be medication side effect as well- wife would like to try lower dose of pristiq at 25 mg- I think that's reasonable. Opposite could be true but hard to read on him- perhaps he is underreporting depression and increasing dose would help- we opted to follow up together to reassess as below  #smoking- encouraged cessations- he is not ready  #hyperlipidemia S: Medication:rosuvastatin 40 mg  Lab Results  Component Value Date   CHOL 157 06/14/2022   HDL 46.30 06/14/2022   LDLCALC 84 06/14/2022   LDLDIRECT 86.0 09/13/2019   TRIG 134.0 06/14/2022   CHOLHDL 3 06/14/2022   A/P: cholesterol with imperfect control but with prediabetes do not feel strongly about increasing the dose  # Hyperglycemia/insulin resistance/prediabetes- noted in context of other issues but not main reason for visit Lab Results  Component Value Date   HGBA1C 5.9 06/14/2022   HGBA1C 6.0 12/05/2021   HGBA1C 6.0 04/09/2021    Recommended follow up: Return in about 2 months (around 11/27/2022) for followup or sooner if needed.Schedule b4 you leave. Future Appointments  Date Time Provider [place]  10/18/2022  9:30 AM Zehr, Tollie Eth [facility]  11/29/2022  8:45 AM [facility] HEALTH COACH [facility]  12/06/2022  9:30 AM Rondel Jumbo, PA-C [facility] None  12/20/2022  3:20 PM Yong Channel Brayton Mars, MD [facility]    Lab/Order associations:   ICD-10-CM   1. Essential hypertension, benign  I10       Meds ordered this encounter  Medications   desvenlafaxine (PRISTIQ) 25 MG 24 hr tablet    Sig: Take 1 tablet (25 mg total) by mouth daily.    Dispense:  90 tablet     Refill:  3    Return precautions advised.  [name], MD

## 2022-10-01 ENCOUNTER — Telehealth: Payer: Self-pay

## 2022-10-01 NOTE — Telephone Encounter (Signed)
        Patient  visited [facility] on 09/11/2022  for contamination of blood culture.   Telephone encounter attempt :  2nd  A HIPAA compliant voice message was left requesting a return call.  Instructed patient to call back at [phone].   [place] Resource Care Guide   [email or web address]  ?? [phone]   Website: [email or web address]

## 2022-10-02 ENCOUNTER — Telehealth: Payer: Self-pay

## 2022-10-02 NOTE — Telephone Encounter (Signed)
        Patient  visited [facility] on 09/11/2022  for contamination of blood culture.   Telephone encounter attempt :  3rd  A HIPAA compliant voice message was left requesting a return call.  Instructed patient to call back at [phone].   [place] Resource Care Guide   ??[email or web address]  ?? [phone]   Website: [email or web address]  [email or web address]

## 2022-10-18 ENCOUNTER — Encounter: Payer: Self-pay | Admitting: Gastroenterology

## 2022-10-18 ENCOUNTER — Ambulatory Visit (INDEPENDENT_AMBULATORY_CARE_PROVIDER_SITE_OTHER): Payer: BC Managed Care – PPO | Admitting: Gastroenterology

## 2022-10-18 VITALS — BP 116/70 | HR 80 | Ht 75.0 in | Wt 223.2 lb

## 2022-10-18 DIAGNOSIS — Z86010 Personal history of colonic polyps: Secondary | ICD-10-CM | POA: Diagnosis not present

## 2022-10-18 DIAGNOSIS — Z800 Family history of malignant neoplasm of digestive organs: Secondary | ICD-10-CM

## 2022-10-18 DIAGNOSIS — Z7901 Long term (current) use of anticoagulants: Secondary | ICD-10-CM | POA: Diagnosis not present

## 2022-10-18 HISTORY — DX: Long term (current) use of anticoagulants: Z79.01

## 2022-10-18 NOTE — Progress Notes (Signed)
10/18/2022 AZTLAN COLL [id number] 17-Sep-1951   HISTORY OF PRESENT ILLNESS:  This is a 72 year old male who is a patient of Dr. Lynne Leader.  He has history of adenomatous colon polyps and family history of colon cancer in his father.  Her is here today to discuss and schedule surveillance colonoscopy.  Last was in 09/2018 as below.  He is on Xarelto for Factor V Leiden with history of clots, which is prescribed by Dr. Yong Channel.  He denies any GI complaints today.  No rectal bleeding.  Moves his bowels well.  Colonoscopy 09/2018:  - One 7 mm polyp in the rectum, removed with a cold snare. Resected and retrieved. Clips (MR conditional) were placed. - Five 6 to 8 mm polyps in the transverse colon, removed with a cold snare. Resected and retrieved. - One 4 mm polyp in the cecum, removed with a cold biopsy forceps. Resected and retrieved. - Moderate diverticulosis in the left colon. - Internal hemorrhoids. - The examination was otherwise normal on direct and retroflexion view   Past Medical History:  Diagnosis Date   Allergic rhinitis 07/31/2007   Loratadine at times- but mild mental fog on this    BPH associated with nocturia 03/10/2018   Clotting disorder    Current smoker 07/26/2014   Has quit several several times and restarted. Most recently quit 11/06/14. Need to keep a close eye.    Essential hypertension, benign 07/26/2014   Benazepril '20mg'$     Factor V Leiden 07/31/2007   Xarelto '20mg'$  daily. 2 DVT 2001, PE 2011.     History of colonic polyps 07/31/2007   02/2015 adenoma. 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 Patient states thought was external.      Left inguinal hernia 12/05/2016   Declines surgery referral- minimally bothering him. Can call in if he changes his mind   Major depressive disorder in full remission  03/24/2009   Wellbutrin xl '300mg'$ -->'150mg'$ --> off 03/2020  pristiq '50mg'$ .   In past-->remeron '15mg'$  mainly takes prn for sleep zoloft --> pristiq per sister who is psychiatrist apparetnly   MCI (mild cognitive impairment) 06/15/2015   Osteoarthritis    Right rotator cuff tear 12/07/2010   S/p surgery.     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 COmpression stocking on left. L >R swelling-history of 1st DVT in L.      Past Surgical History:  Procedure Laterality Date   right rotator cuff repair     SALIVARY STONE REMOVAL      reports that he has been smoking cigarettes. He has a 45.00 pack-year smoking history. He has never used smokeless tobacco. He reports current alcohol use. He reports that he does not use drugs. family history includes Colon cancer (age of onset: 36) in his father; Factor V Leiden deficiency in his son; Memory loss in his mother. Allergies  Allergen Reactions   Yellow Jacket Venom [Bee Venom] Shortness Of Breath    Had swelling localized, chest tightness, and shortness of breath      Outpatient Encounter Medications as of 10/18/2022  Medication Sig   desvenlafaxine (PRISTIQ) 25 MG 24 hr tablet Take 1 tablet (25 mg total) by mouth daily.  donepezil (ARICEPT) 10 MG tablet Take 10 mg by mouth at bedtime.   Loratadine 10 MG CAPS Take by mouth daily.   Multiple Vitamin (MULTIVITAMIN) capsule Take by mouth.   rosuvastatin (CRESTOR) 40 MG tablet Take 1 tablet by mouth once daily   XARELTO 20 MG TABS tablet Take 1 tablet by mouth once daily   No facility-administered encounter medications on file as of 10/18/2022.     REVIEW OF SYSTEMS  : All other systems reviewed and negative except where noted in the History of Present Illness.   PHYSICAL EXAM: BP 116/70 (BP Location: Left Arm, Patient Position: Sitting, Cuff Size: Normal)   Pulse 80    Ht '6\' 3"'$  (1.905 m)   Wt 223 lb 4 oz (101.3 kg)   SpO2 97%   BMI 27.90 kg/m  General: Well developed white male in no acute distress Head: Normocephalic and atraumatic Eyes:  Sclerae anicteric, conjunctiva pink. Ears: Normal auditory acuity Lungs: Clear throughout to auscultation; no W/R/R. Heart: Regular rate and rhythm; no M/R/G. Abdomen: Soft, non-distended.  BS present.  Non-tender. Rectal:  Will be done at the time of colonoscopy. Musculoskeletal: Symmetrical with no gross deformities  Skin: No lesions on visible extremities Extremities: No edema  Neurological: Alert oriented x 4, grossly non-focal Psychological:  Alert and cooperative. Normal mood and affect  ASSESSMENT AND PLAN: *Personal history of adenomatous colon polyps and family history of colon cancer in his father:  Had several adenomatous polyps removed in 09/2018 with repeat recommended in 3 years.  Will schedule with Dr. Fuller Plan. *Chronic anticoagulation with Xarelto for history of clotting disorder, Factor V Leiden with clots in the past:  Will hold Xarelto for 2 days prior to endoscopic procedures - will instruct when and how to resume after procedure. Benefits and risks of procedure explained including risks of bleeding, perforation, infection, missed lesions, reactions to medications and possible need for hospitalization and surgery for complications. Additional rare but real risk of stroke or other vascular clotting events off of Xarelto also explained and need to seek urgent help if any signs of these problems occur. Will communicate by phone or EMR with patient's prescribing provider, Dr. Yong Channel, to confirm that holding Xarelto is reasonable in this case.     CC:  Marin Olp, MD

## 2022-10-18 NOTE — Patient Instructions (Addendum)
It has been recommended to you by your physician that you have a(n) colonoscopy completed. Per your request, we did not schedule the procedure(s) today. Please contact our office at [phone] should you decide to have the procedure completed. You will be scheduled for a pre-visit and procedure at that time.   _______________________________________________________  If you are age 71 or older, your body mass index should be between 23-30. Your Body mass index is 27.9 kg/m. If this is out of the aforementioned range listed, please consider follow up with your Primary Care Provider.  If you are age 41 or younger, your body mass index should be between 19-25. Your Body mass index is 27.9 kg/m. If this is out of the aformentioned range listed, please consider follow up with your Primary Care Provider.   ________________________________________________________  The [place] [facility] providers would like to encourage you to use [facility] to communicate with providers for non-urgent requests or questions.  Due to long hold times on the telephone, sending your provider a message by [facility] may be a faster and more efficient way to get a response.  Please allow 48 business hours for a response.  Please remember that this is for non-urgent requests.  _______________________________________________________

## 2022-10-21 ENCOUNTER — Encounter: Payer: Self-pay | Admitting: Gastroenterology

## 2022-10-25 ENCOUNTER — Telehealth: Payer: Self-pay | Admitting: *Deleted

## 2022-10-25 NOTE — Telephone Encounter (Signed)
Please advise 

## 2022-10-25 NOTE — Telephone Encounter (Signed)
He has Factor V Leiden with history of DVT- if possible would prefer to hold day before procedure only. So if procedure is AM 12/11/22 then last dose 12/09/22 in am- is that ok with you all?  Thanks, [name]

## 2022-10-25 NOTE — Telephone Encounter (Signed)
   Rodney Scott [birth year] [id number]  Dear Dr. Yong Channel:  We have scheduled the above named patient for a(n) colonoscopy procedure. Our records show that (s)he is on anticoagulation therapy.  Please advise as to whether the patient may come off their therapy of Xarelto 2 days prior to their procedure which is scheduled for 12/11/22.  Please route your response to Caryl Asp, [place] or fax response to [phone].  Sincerely,    [facility]

## 2022-10-28 NOTE — Telephone Encounter (Signed)
Ok I'm on board- lets move forward with procedure. Thank you all so much

## 2022-10-28 NOTE — Telephone Encounter (Signed)
Per Dr. Fuller Plan he is okay with your recommendation.

## 2022-11-13 ENCOUNTER — Ambulatory Visit (AMBULATORY_SURGERY_CENTER): Payer: BC Managed Care – PPO

## 2022-11-13 VITALS — Ht 75.0 in | Wt 222.0 lb

## 2022-11-13 DIAGNOSIS — Z86010 Personal history of colonic polyps: Secondary | ICD-10-CM

## 2022-11-13 MED ORDER — NA SULFATE-K SULFATE-MG SULF 17.5-3.13-1.6 GM/177ML PO SOLN: 1.0000 | Freq: Once | ORAL | 0 refills | Status: AC

## 2022-11-13 NOTE — Progress Notes (Signed)
No egg or soy allergy known to patient  No issues known to pt with past sedation with any surgeries or procedures Patient denies ever being told they had issues or difficulty with intubation  No FH of Malignant Hyperthermia Pt is not on diet pills Pt is not on  home 02  Pt is taking Xarelto. Clearance for 1 day hold prior to colonoscopy Pt denies issues with constipation  No A fib or A flutter Have any cardiac testing pending--no  Pt instructed to use [email or web address] or GoodRx for a price reduction on prep   Patient's chart reviewed by Osvaldo Angst CNRA prior to previsit and patient appropriate for the [place].  Previsit completed and red dot placed by patient's name on their procedure day (on provider's schedule).

## 2022-11-21 ENCOUNTER — Ambulatory Visit (INDEPENDENT_AMBULATORY_CARE_PROVIDER_SITE_OTHER): Payer: BC Managed Care – PPO

## 2022-11-21 VITALS — BP 114/68 | HR 82 | Temp 98.0°F | Wt 224.4 lb

## 2022-11-21 DIAGNOSIS — Z0000 Encounter for general adult medical examination without abnormal findings: Secondary | ICD-10-CM | POA: Diagnosis not present

## 2022-11-21 NOTE — Progress Notes (Deleted)
Subjective:   Rodney Scott is a 72 y.o. male who presents for Medicare Annual/Subsequent preventive examination.  Review of Systems     Cardiac Risk Factors include: advanced age (>66mn, >>62women);hypertension;male gender;dyslipidemia;smoking/ tobacco exposure Patient Medicare AWV questionnaire was completed by the patient on 11/18/22; I have confirmed that all information answered by patient is correct and no changes since this date.       Objective:    Today's Vitals   11/21/22 0759  BP: 114/68  Pulse: 82  Temp: 98 F (36.7 C)  SpO2: 98%  Weight: 224 lb 6.4 oz (101.8 kg)   Body mass index is 28.05 kg/m.     11/21/2022    8:09 AM 09/11/2022    7:42 AM 09/10/2022    2:20 PM 06/07/2022    8:45 AM 05/11/2022    1:01 PM 11/16/2021    8:55 AM 11/09/2021    8:52 AM  Advanced Directives  Does Patient Have a Medical Advance Directive? Yes No No Yes No Yes No  Type of AParamedicof AVall[place]Living will     [place]  Does patient want to make changes to medical advance directive? No - Patient declined        Copy of [facility]. [place]? Yes - validated most recent copy scanned in chart (See row information)     Yes - validated most recent copy scanned in chart (See row information)   Would patient like information on creating a medical advance directive?     No - Patient declined      Current Medications (verified) Outpatient Encounter Medications as of 11/21/2022  Medication Sig   benazepril (LOTENSIN) 20 MG tablet Take 20 mg by mouth daily.   desvenlafaxine (PRISTIQ) 25 MG 24 hr tablet Take 1 tablet (25 mg total) by mouth daily.   donepezil (ARICEPT) 10 MG tablet Take 10 mg by mouth at bedtime.   Loratadine 10 MG CAPS Take by mouth daily.   Multiple Vitamin (MULTIVITAMIN) capsule Take by mouth.   rosuvastatin (CRESTOR) 40 MG tablet Take 1 tablet by mouth once daily   XARELTO 20 MG TABS tablet Take 1 tablet by mouth  once daily   No facility-administered encounter medications on file as of 11/21/2022.    Allergies (verified) Yellow jacket venom [bee venom]   History: Past Medical History:  Diagnosis Date   Allergic rhinitis 07/31/2007   Loratadine at times- but mild mental fog on this    BPH associated with nocturia 03/10/2018   Clotting disorder    Current smoker 07/26/2014   Has quit several several times and restarted. Most recently quit 11/06/14. Need to keep a close eye.    Essential hypertension, benign 07/26/2014   Benazepril 26m   Factor V Leiden 07/31/2007   Xarelto 2076maily. 2 DVT 2001, PE 2011.     History of colonic polyps 07/31/2007   02/2015 adenoma. 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 Patient states thought was external.      Left inguinal hernia 12/05/2016   Declines surgery referral- minimally bothering him. Can call in if he changes his mind   Major depressive disorder in full remission 03/24/2009   Wellbutrin xl 300m61m150mg--> off 03/2020  pristiq 50mg57mIn past-->remeron 15mg34m  mainly takes prn for sleep zoloft --> pristiq per sister who is psychiatrist apparetnly   MCI (mild cognitive impairment) 06/15/2015   Osteoarthritis    Right rotator cuff tear 12/07/2010   S/p surgery.     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 COmpression stocking on left. L >R swelling-history of 1st DVT in L.      Past Surgical History:  Procedure Laterality Date   right rotator cuff repair     SALIVARY STONE REMOVAL     Family History  Problem Relation Age of Onset   Memory loss Mother    Colon polyps Father    Colon cancer Father 78   Factor V Leiden deficiency Son    Esophageal cancer Neg Hx     Rectal cancer Neg Hx    Stomach cancer Neg Hx    Social History   Socioeconomic History   Marital status: Married    Spouse name: Not on file   Number of children: 2   Years of education: 12   Highest education level: High school graduate  Occupational History   Occupation: Maintenance  Tobacco Use   Smoking status: Every Day    Packs/day: 1.00    Years: 45.00    Total pack years: 45.00    Types: Cigarettes   Smokeless tobacco: Never  Vaping Use   Vaping Use: Never used  Substance and Sexual Activity   Alcohol use: Yes    Comment: rare alcohol consumption   Drug use: No   Sexual activity: Yes  Other Topics Concern   Not on file  Social History Narrative   Married (wife patient of Dr. Yong Channel). Son and daughter. 1 granddaughter.       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      Hobbies: time around house, time with family, yardwork, some Energy in 2 story home with wife and granddaughter, [employer] geologist school graduate      Right handed    Social Determinants of Health   Financial Resource Strain: [place]  (11/18/2022)   Overall Financial Resource Strain (CARDIA)    Difficulty of Paying Living Expenses: Not hard at all  Food Insecurity: No Food Insecurity (11/18/2022)   Hunger Vital Sign    Worried About Running Out of Food in the Last Year: Never true    [place] in the Last Year: Never true  Transportation Needs: No Transportation Needs (11/18/2022)   PRAPARE - Hydrologist (Medical): No    Lack of Transportation (Non-Medical): No  Physical Activity: Inactive (11/18/2022)   Exercise Vital Sign    Days of Exercise per Week: 0 days    Minutes of Exercise per Session: 0 min  Stress: No Stress Concern Present (11/18/2022)   [place]    Feeling of Stress : Only a little   Social Connections: Unknown (11/18/2022)   Social Connection and Isolation Panel [NHANES]    Frequency of Communication with Friends and Family: Once a week    Frequency of Social Gatherings with Friends and Family: Once a week    Attends Religious Services: Not on Diplomatic Services operational officer of Clubs or Organizations: No  Attends Archivist Meetings: Never    Marital Status: Married    Tobacco Counseling Ready to quit: Not Answered Counseling given: Not Answered   Clinical Intake:  Pre-visit preparation completed: Yes  Pain : No/denies pain     BMI - recorded: 28.05 Nutritional Status: BMI 25 -29 Overweight Nutritional Risks: None Diabetes: No  How often do you need to have someone help you when you read instructions, pamphlets, or other written materials from your doctor or pharmacy?: 1 - Never  Diabetic?no  Interpreter Needed?: No  Information entered by :: Charlott Rakes, LPN   Activities of Daily Living    11/18/2022   11:12 AM  In your present state of health, do you have any difficulty performing the following activities:  Hearing? 1  Comment hearing aids  Vision? 0  Difficulty concentrating or making decisions? 1  Comment at times  Walking or climbing stairs? 0  Dressing or bathing? 0  Doing errands, shopping? 1  Comment have a Holiday representative and eating ? N  Using the Toilet? N  In the past six months, have you accidently leaked urine? N  Do you have problems with loss of bowel control? N  Managing your Medications? N  Managing your Finances? Y  Comment forget to pay a Civil engineer, contracting or managing your Housekeeping? N    Patient Care Team: Marin Olp, MD as PCP - General (Family Medicine) Cameron Sprang, MD as Consulting Physician (Neurology) Madelin Rear, [facility] (Inactive) as Pharmacist (Pharmacist) Elease Hashimoto (Neurology)  Indicate any recent Medical Services you may have received from other than [facility] providers in  the past year (date may be approximate).     Assessment:   This is a routine wellness examination for Kailee.  Hearing/Vision screen Hearing Screening - Comments:: Pt hearing aids  Vision Screening - Comments:: Pt follows up with Dr Katy Fitch for annual eye exams   Dietary issues and exercise activities discussed: Current Exercise Habits: The patient has a physically strenuous job, but has no regular exercise apart from work.   Goals Addressed             This Visit's Progress    Patient Stated       None at this time        Depression Screen    11/21/2022    8:08 AM 09/13/2022    4:05 PM 04/22/2022    4:20 PM 12/05/2021   10:45 AM 11/16/2021    8:54 AM 04/09/2021   10:30 AM 11/10/2020    8:55 AM  PHQ 2/9 Scores  PHQ - 2 Score 0 0 3 0 0 0 0  PHQ- 9 Score  0 6 2  0     Fall Risk    11/18/2022   11:12 AM 06/07/2022    8:45 AM 11/16/2021    8:56 AM 11/09/2021    8:52 AM 05/09/2021    9:56 AM  Fall Risk   Falls in the past year? 0 0 0 0 0  Number falls in past yr: 0 0 0 0 0  Injury with Fall? 0 0 0 0 0  Risk for fall due to :   Impaired vision    Follow up   Falls prevention discussed      FALL RISK PREVENTION PERTAINING TO THE HOME:  Any stairs in or around the home? Yes  If so, are there any without handrails? No  Home free of loose throw  rugs in walkways, pet beds, electrical cords, etc? Yes  Adequate lighting in your home to reduce risk of falls? Yes   ASSISTIVE DEVICES UTILIZED TO PREVENT FALLS:  Life alert? No  Use of a cane, walker or w/c? No  Grab bars in the bathroom? No  Shower chair or bench in shower? No  Elevated toilet seat or a handicapped toilet? No   TIMED UP AND GO:  Was the test performed? Yes .  Length of time to ambulate 10 feet: 10 sec.   Gait steady and fast without use of assistive device  Cognitive Function:    06/07/2022    9:00 AM 11/09/2021   12:00 PM 09/25/2018    1:22 PM  MMSE - Mini Mental State Exam  Orientation to time 5 5 5   $ Orientation to Place 5 5 5  $ Registration 3 3 3  $ Attention/ Calculation 5 2 5  $ Recall 2 0 3  Language- name 2 objects 2 2 2  $ Language- repeat 1 1 1  $ Language- follow 3 step command 3 1 3  $ Language- read & follow direction 1 1 1  $ Write a sentence 1 1 1  $ Copy design 1 1 1  $ Total score 29 22 30      $ 04/23/2019    9:00 AM 12/14/2018    2:00 PM 09/25/2018   10:00 AM  Montreal Cognitive Assessment   Visuospatial/ Executive (0/5) 5 4 5  $ Naming (0/3) 3 3 3  $ Attention: Read list of digits (0/2) 2 2 2  $ Attention: Read list of letters (0/1) 0 1 1  Attention: Serial 7 subtraction starting at 100 (0/3) 3 3 3  $ Language: Repeat phrase (0/2) 2 2 2  $ Language : Fluency (0/1) 1 1 1  $ Abstraction (0/2) 2 2 2  $ Delayed Recall (0/5) 0 0 0  Orientation (0/6) 6 6 6  $ Total 24 24 25  $ Adjusted Score (based on education) 25 25       11/21/2022    8:12 AM 11/16/2021    8:59 AM 11/10/2020    9:06 AM  6CIT Screen  What Year? 0 points 0 points 0 points  What month? 0 points 0 points 0 points  What time? 0 points 0 points   Count back from 20 0 points 0 points 0 points  Months in reverse 0 points 0 points 0 points  Repeat phrase 0 points 2 points 0 points  Total Score 0 points 2 points     Immunizations Immunization History  Administered Date(s) Administered   Fluad Quad(high Dose 65+) 07/22/2020, 07/23/2021, 08/01/2021   Influenza Split 07/26/2011, 08/07/2012   Influenza Whole 07/31/2007, 07/15/2008, 07/07/2009, 06/29/2010   Influenza, High Dose Seasonal PF 09/08/2017, 07/15/2018, 07/10/2019   Influenza,inj,Quad PF,6+ Mos 08/20/2013, 07/26/2014, 06/15/2015, 07/05/2016   Influenza-Unspecified 07/10/2019   Moderna Covid-19 Vaccine Bivalent Booster 42yr & up 07/27/2022   PFIZER(Purple Top)SARS-COV-2 Vaccination 11/03/2019, 11/24/2019, 07/22/2020, 03/03/2021   PNEUMOCOCCAL CONJUGATE-20 03/03/2021   Pneumococcal Conjugate-13 06/05/2017   Pneumococcal Polysaccharide-23 06/22/2010, 07/15/2018   Td  10/14/2001   Tdap 01/03/2012   Zoster Recombinat (Shingrix) 07/10/2019, 09/29/2019    TDAP status: Due, Education has been provided regarding the importance of this vaccine. Advised may receive this vaccine at local pharmacy or Health Dept. Aware to provide a copy of the vaccination record if obtained from local pharmacy or Health Dept. Verbalized acceptance and understanding.  Flu Vaccine status: Due, Education has been provided regarding the importance of this vaccine. Advised may receive this vaccine at  local pharmacy or Health Dept. Aware to provide a copy of the vaccination record if obtained from local pharmacy or Health Dept. Verbalized acceptance and understanding.  Pneumococcal vaccine status: Up to date  Covid-19 vaccine status: Completed vaccines  Qualifies for Shingles Vaccine? Yes   Zostavax completed Yes   Shingrix Completed?: Yes  Screening Tests Health Maintenance  Topic Date Due   DTaP/Tdap/Td (3 - Td or Tdap) 01/02/2022   COVID-19 Vaccine (6 - 2023-24 season) 09/21/2022   INFLUENZA VACCINE  01/12/2023 (Originally 05/14/2022)   Lung Cancer Screening  09/14/2023 (Originally 06/24/2011)   COLONOSCOPY (Pts 45-62yr Insurance coverage will need to be confirmed)  09/14/2023 (Originally 09/24/2021)   Hepatitis C Screening  11/11/2109 (Originally 01/02/1969)   Medicare Annual Wellness (AWV)  11/22/2023   Pneumonia Vaccine 72 Years old  Completed   Zoster Vaccines- Shingrix  Completed   HPV VACCINES  Aged Out    Health Maintenance  Health Maintenance Due  Topic Date Due   DTaP/Tdap/Td (3 - Td or Tdap) 01/02/2022   COVID-19 Vaccine (6 - 2023-24 season) 09/21/2022    Colonoscopy scheduled for 12/12/22  Lung Cancer Screening: (Low Dose CT Chest recommended if Age 71-80years, 30 pack-year currently smoking OR have quit w/in 15years.) does qualify.   Lung Cancer Screening Referral: declined   Additional Screening:  Hepatitis C Screening: does qualify;   Vision  Screening: Recommended annual ophthalmology exams for early detection of glaucoma and other disorders of the eye. Is the patient up to date with their annual eye exam?  No  Who is the provider or what is the name of the office in which the patient attends annual eye exams? Dr GKaty Fitch If pt is not established with a provider, would they like to be referred to a provider to establish care? No .   Dental Screening: Recommended annual dental exams for proper oral hygiene  Community Resource Referral / Chronic Care Management: CRR required this visit?  No   CCM required this visit?  No      Plan:     I have personally reviewed and noted the following in the patient's chart:   Medical and social history Use of alcohol, tobacco or illicit drugs  Current medications and supplements including opioid prescriptions. Patient is not currently taking opioid prescriptions. Functional ability and status Nutritional status Physical activity Advanced directives List of other physicians Hospitalizations, surgeries, and ER visits in previous 12 months Vitals Screenings to include cognitive, depression, and falls Referrals and appointments  In addition, I have reviewed and discussed with patient certain preventive protocols, quality metrics, and best practice recommendations. A written personalized care plan for preventive services as well as general preventive health recommendations were provided to patient.     TWillette Brace LPN   [id number]  Nurse Notes: none

## 2022-11-21 NOTE — Patient Instructions (Signed)
[name] , Thank you for taking time to come for your Medicare Wellness Visit. I appreciate your ongoing commitment to your health goals. Please review the following plan we discussed and let me know if I can assist you in the future.   These are the goals we discussed:  Goals      DIET - EAT MORE FRUITS AND VEGETABLES     Increase physical activity     Patient Stated     More active     Patient Stated     Continue improving memory      [facility] (see longitudinal plan of care for additional care plan information)  Current Barriers:  Chronic Disease Management support, education, and care coordination needs related to Hypertension, Hyperlipidemia, and smoking cessation.   Hypertension BP Readings from Last 3 Encounters:  09/22/20 132/86  08/28/20 116/76  03/15/20 130/82  Pharmacist Clinical Goal(s): Over the next 365 days, patient will work with PharmD and providers to maintain BP goal <130/80 Current regimen:  Beneazapril 20 mg once daily Interventions: We discussed diet/exercise - Maintain a healthy weight and exercise regularly, as directed by your health care provider. Eat healthy foods, such as: Lean proteins, complex carbohydrates, fresh fruits and vegetables, low-fat dairy products, healthy fats Patient self care activities - Over the next 365 days, patient will: Check BP at least once every 1-2 weeks, document, and provide at future appointments Ensure daily salt intake < 2300 mg/day  Hyperlipidemia Lab Results  Component Value Date/Time   LDLCALC 88 03/15/2020 10:07 AM   LDLDIRECT 86.0 09/13/2019 09:15 AM  Pharmacist Clinical Goal(s): Over the next 365 days, patient will work with PharmD and providers to maintain LDL goal < 100 Current regimen:  Rosuvastatin 40 mg once daily Interventions: Reviewed diet/exercise - see htn Patient self care activities - Over the next 365 days, patient will: Continue current management  Smoking  cessation Pharmacist Clinical Goal(s) Over the next 90 days, patient will work with PharmD and providers to come up with plan to reduce number of cigarettes per day - current just shy one 1 pack ~18 cigarettes per day. Current regimen:  N/a Interventions: Counseled on smoking cessation  Agreed and very much open to discuss at follow-up visit.  Patient self care activities - Over the next 90 days, patient will: Consider cutting down on packs per day - ideally would want to get to half pack per day then discuss bringing nicotine replacement therapies onboard  Medication management Pharmacist Clinical Goal(s): Over the next 365 days, patient will work with PharmD and providers to maintain optimal medication adherence Current pharmacy: [facility] Interventions Comprehensive medication review performed. Utilize [facility] for medication synchronization, packaging and delivery Patient self care activities - Over the next 365 days, patient will: Take medications as prescribed Report any questions or concerns to PharmD and/or provider(s) Initial goal documentation.        This is a list of the screening recommended for you and due dates:  Health Maintenance  Topic Date Due   DTaP/Tdap/Td vaccine (3 - Td or Tdap) 01/02/2022   COVID-19 Vaccine (6 - 2023-24 season) 09/21/2022   Flu Shot  01/12/2023*   Screening for Lung Cancer  09/14/2023*   Colon Cancer Screening  09/14/2023*   Hepatitis C Screening: USPSTF Recommendation to screen - Ages 18-79 yo.  11/11/2109*   Medicare Annual Wellness Visit  11/22/2023   Pneumonia Vaccine  Completed   Zoster (Shingles)  Vaccine  Completed   HPV Vaccine  Aged Out  *Topic was postponed. The date shown is not the original due date.    Advanced directives: copies in chart   Conditions/risks identified: none at this time   Next appointment: Follow up in one year for your annual wellness visit.   Preventive Care 71 Years and Older,  Male  Preventive care refers to lifestyle choices and visits with your health care provider that can promote health and wellness. What does preventive care include? A yearly physical exam. This is also called an annual well check. Dental exams once or twice a year. Routine eye exams. Ask your health care provider how often you should have your eyes checked. Personal lifestyle choices, including: Daily care of your teeth and gums. Regular physical activity. Eating a healthy diet. Avoiding tobacco and drug use. Limiting alcohol use. Practicing safe sex. Taking low doses of aspirin every day. Taking vitamin and mineral supplements as recommended by your health care provider. What happens during an annual well check? The services and screenings done by your health care provider during your annual well check will depend on your age, overall health, lifestyle risk factors, and family history of disease. Counseling  Your health care provider may ask you questions about your: Alcohol use. Tobacco use. Drug use. Emotional well-being. Home and relationship well-being. Sexual activity. Eating habits. History of falls. Memory and ability to understand (cognition). Work and work Statistician. Screening  You may have the following tests or measurements: Height, weight, and BMI. Blood pressure. Lipid and cholesterol levels. These may be checked every 5 years, or more frequently if you are over 71 years old. Skin check. Lung cancer screening. You may have this screening every year starting at age 30 if you have a 30-pack-year history of smoking and currently smoke or have quit within the past 15 years. Fecal occult blood test (FOBT) of the stool. You may have this test every year starting at age 40. Flexible sigmoidoscopy or colonoscopy. You may have a sigmoidoscopy every 5 years or a colonoscopy every 10 years starting at age 59. Prostate cancer screening. Recommendations will vary depending  on your family history and other risks. Hepatitis C blood test. Hepatitis B blood test. Sexually transmitted disease (STD) testing. Diabetes screening. This is done by checking your blood sugar (glucose) after you have not eaten for a while (fasting). You may have this done every 1-3 years. Abdominal aortic aneurysm (AAA) screening. You may need this if you are a current or former smoker. Osteoporosis. You may be screened starting at age 65 if you are at high risk. Talk with your health care provider about your test results, treatment options, and if necessary, the need for more tests. Vaccines  Your health care provider may recommend certain vaccines, such as: Influenza vaccine. This is recommended every year. Tetanus, diphtheria, and acellular pertussis (Tdap, Td) vaccine. You may need a Td booster every 10 years. Zoster vaccine. You may need this after age 77. Pneumococcal 13-valent conjugate (PCV13) vaccine. One dose is recommended after age 57. Pneumococcal polysaccharide (PPSV23) vaccine. One dose is recommended after age 46. Talk to your health care provider about which screenings and vaccines you need and how often you need them. This information is not intended to replace advice given to you by your health care provider. Make sure you discuss any questions you have with your health care provider. Document Released: 10/27/2015 Document Revised: 06/19/2016 Document Reviewed: 08/01/2015 Elsevier Interactive Patient Education  2017 [place] Prevention in the Home Falls can cause injuries. They can happen to people of all ages. There are many things you can do to make your home safe and to help prevent falls. What can I do on the outside of my home? Regularly fix the edges of walkways and driveways and fix any cracks. Remove anything that might make you trip as you walk through a door, such as a raised step or threshold. Trim any bushes or trees on the path to your home. Use  bright outdoor lighting. Clear any walking paths of anything that might make someone trip, such as rocks or tools. Regularly check to see if handrails are loose or broken. Make sure that both sides of any steps have handrails. Any raised decks and porches should have guardrails on the edges. Have any leaves, snow, or ice cleared regularly. Use sand or salt on walking paths during winter. Clean up any spills in your garage right away. This includes oil or grease spills. What can I do in the bathroom? Use night lights. Install grab bars by the toilet and in the tub and shower. Do not use towel bars as grab bars. Use non-skid mats or decals in the tub or shower. If you need to sit down in the shower, use a plastic, non-slip stool. Keep the floor dry. Clean up any water that spills on the floor as soon as it happens. Remove soap buildup in the tub or shower regularly. Attach bath mats securely with double-sided non-slip rug tape. Do not have throw rugs and other things on the floor that can make you trip. What can I do in the bedroom? Use night lights. Make sure that you have a light by your bed that is easy to reach. Do not use any sheets or blankets that are too big for your bed. They should not hang down onto the floor. Have a firm chair that has side arms. You can use this for support while you get dressed. Do not have throw rugs and other things on the floor that can make you trip. What can I do in the kitchen? Clean up any spills right away. Avoid walking on wet floors. Keep items that you use a lot in easy-to-reach places. If you need to reach something above you, use a strong step stool that has a grab bar. Keep electrical cords out of the way. Do not use floor polish or wax that makes floors slippery. If you must use wax, use non-skid floor wax. Do not have throw rugs and other things on the floor that can make you trip. What can I do with my stairs? Do not leave any items on the  stairs. Make sure that there are handrails on both sides of the stairs and use them. Fix handrails that are broken or loose. Make sure that handrails are as long as the stairways. Check any carpeting to make sure that it is firmly attached to the stairs. Fix any carpet that is loose or worn. Avoid having throw rugs at the top or bottom of the stairs. If you do have throw rugs, attach them to the floor with carpet tape. Make sure that you have a light switch at the top of the stairs and the bottom of the stairs. If you do not have them, ask someone to add them for you. What else can I do to help prevent falls? Wear shoes that: Do not have high heels. Have rubber bottoms.  Are comfortable and fit you well. Are closed at the toe. Do not wear sandals. If you use a stepladder: Make sure that it is fully opened. Do not climb a closed stepladder. Make sure that both sides of the stepladder are locked into place. Ask someone to hold it for you, if possible. Clearly mark and make sure that you can see: Any grab bars or handrails. First and last steps. Where the edge of each step is. Use tools that help you move around (mobility aids) if they are needed. These include: Canes. Walkers. Scooters. Crutches. Turn on the lights when you go into a dark area. Replace any light bulbs as soon as they burn out. Set up your furniture so you have a clear path. Avoid moving your furniture around. If any of your floors are uneven, fix them. If there are any pets around you, be aware of where they are. Review your medicines with your doctor. Some medicines can make you feel dizzy. This can increase your chance of falling. Ask your doctor what other things that you can do to help prevent falls. This information is not intended to replace advice given to you by your health care provider. Make sure you discuss any questions you have with your health care provider. Document Released: 07/27/2009 Document Revised:  03/07/2016 Document Reviewed: 11/04/2014 Elsevier Interactive Patient Education  2017 [employer].

## 2022-11-25 NOTE — Progress Notes (Signed)
Subjective:   Rodney Scott is a 72 y.o. male who presents for Medicare Annual/Subsequent preventive examination.  Patient Medicare AWV questionnaire was completed by the patient on 11/18/22; I have confirmed that all information answered by patient is correct and no changes since this date.          Review of Systems     Cardiac Risk Factors include: advanced age (>83mn, >>44women);hypertension;male gender;dyslipidemia;smoking/ tobacco exposure Patient Medicare AWV questionnaire was completed by the patient on 11/18/22; I have confirmed that all information answered by patient is correct and no changes since this date.       Objective:    Today's Vitals   11/21/22 0759  BP: 114/68  Pulse: 82  Temp: 98 F (36.7 C)  SpO2: 98%  Weight: 224 lb 6.4 oz (101.8 kg)   Body mass index is 28.05 kg/m.     11/21/2022    8:09 AM 09/11/2022    7:42 AM 09/10/2022    2:20 PM 06/07/2022    8:45 AM 05/11/2022    1:01 PM 11/16/2021    8:55 AM 11/09/2021    8:52 AM  Advanced Directives  Does Patient Have a Medical Advance Directive? Yes No No Yes No Yes No  Type of AParamed[place]of [place] will     [place]  Does patient want to make changes to medical advance directive? No - Patient declined        Copy of [place] Chart? Yes - validated most recent copy scanned in chart (See row information)     Yes - validated most recent copy scanned in chart (See row information)   Would patient like information on creating a medical advance directive?     No - Patient declined      Current Medications (verified) Outpatient Encounter Medications as of 11/21/2022  Medication Sig   benazepril (LOTENSIN) 20 MG tablet Take 20 mg by mouth daily.   desvenlafaxine (PRISTIQ) 25 MG 24 hr tablet Take 1 tablet (25 mg total) by mouth daily.   donepezil (ARICEPT) 10 MG tablet Take 10 mg by mouth at bedtime.   Loratadine 10 MG CAPS Take by  mouth daily.   Multiple Vitamin (MULTIVITAMIN) capsule Take by mouth.   rosuvastatin (CRESTOR) 40 MG tablet Take 1 tablet by mouth once daily   XARELTO 20 MG TABS tablet Take 1 tablet by mouth once daily   No facility-administered encounter medications on file as of 11/21/2022.    Allergies (verified) Yellow jacket venom [bee venom]   History: Past Medical History:  Diagnosis Date   Allergic rhinitis 07/31/2007   Loratadine at times- but mild mental fog on this    BPH associated with nocturia 03/10/2018   Clotting disorder    Current smoker 07/26/2014   Has quit several several times and restarted. Most recently quit 11/06/14. Need to keep a close eye.    Essential hypertension, benign 07/26/2014   Benazepril 265m   Factor V Leiden 07/31/2007   Xarelto 2048maily. 2 DVT 2001, PE 2011.     History of colonic polyps 07/31/2007   02/2015 adenoma. 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 Patient states thought was external.      Left inguinal hernia 12/05/2016   Declines  surgery referral- minimally bothering him. Can call in if he changes his mind   Major depressive disorder in full remission 03/24/2009   Wellbutrin xl 348m-->150mg--> off 03/2020  pristiq 563m   In past-->remeron 154mainly takes prn for sleep zoloft --> pristiq per sister who is psychiatrist apparetnly   MCI (mild cognitive impairment) 06/15/2015   Osteoarthritis    Right rotator cuff tear 12/07/2010   S/p surgery.     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 COmpression stocking on left. L >R swelling-history of 1st DVT in L.      Past Surgical History:  Procedure Laterality Date   right rotator cuff repair     SALIVARY STONE  REMOVAL     Family History  Problem Relation Age of Onset   Memory loss Mother    Colon polyps Father    Colon cancer Father 60 53Factor V Leiden deficiency Son    Esophageal cancer Neg Hx    Rectal cancer Neg Hx    Stomach cancer Neg Hx    Social History   Socioeconomic History   Marital status: Married    Spouse name: Not on file   Number of children: 2   Years of education: 12   Highest education level: High school graduate  Occupational History   Occupation: Maintenance  Tobacco Use   Smoking status: Every Day    Packs/day: 1.00    Years: 45.00    Total pack years: 45.00    Types: Cigarettes   Smokeless tobacco: Never  Vaping Use   Vaping Use: Never used  Substance and Sexual Activity   Alcohol use: Yes    Comment: rare alcohol consumption   Drug use: No   Sexual activity: Yes  Other Topics Concern   Not on file  Social History Narrative   Married (wife patient of Dr. HunYong ChannelSon and daughter. 1 granddaughter.       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      Hobbies: time around house, time with family, yardwork, some cabDortches 2 story home with wife and granddaughter, LilWellsite geologisthool graduate      Right handed    Social Determinants of Health   Financial Resource Strain: [place]2/02/2023)   Overall Financial Resource Strain (CARDIA)    Difficulty of Paying Living Expenses: Not hard at all  Food Insecurity: No Food Insecurity (11/18/2022)   Hunger Vital Sign    Worried About Running Out of Food in the Last Year: Never true    [place]: Never true  Transportation Needs: No Transportation Needs (11/18/2022)   PRAPARE - TraHydrologistedical): No    Lack of Transportation (Non-Medical): No  Physical Activity: Inactive (11/18/2022)   Exercise Vital Sign    Days of Exercise per Week: 0 days     Minutes of Exercise per Session: 0 min  Stress: No Stress Concern Present (11/18/2022)   FinGobles Feeling of Stress : Only a little  Social Connections: Unknown (11/18/2022)   Social Connection and Isolation Panel [NHANES]    Frequency of Communication with Friends and Family: Once  a week    Frequency of Social Gatherings with Friends and Family: Once a week    Attends Religious Services: Not on Advertising copywriter or Organizations: No    Attends Archivist Meetings: Never    Marital Status: Married    Tobacco Counseling Ready to quit: Not Answered Counseling given: Not Answered   Clinical Intake:  Pre-visit preparation completed: Yes  Pain : No/denies pain     BMI - recorded: 28.05 Nutritional Status: BMI 25 -29 Overweight Nutritional Risks: None Diabetes: No  How often do you need to have someone help you when you read instructions, pamphlets, or other written materials from your doctor or pharmacy?: 1 - Never  Diabetic?no  Interpreter Needed?: No  Information entered by :: Charlott Rakes, LPN   Activities of Daily Living    11/18/2022   11:12 AM  In your present state of health, do you have any difficulty performing the following activities:  Hearing? 1  Comment hearing aids  Vision? 0  Difficulty concentrating or making decisions? 1  Comment at times  Walking or climbing stairs? 0  Dressing or bathing? 0  Doing errands, shopping? 1  Comment have a Holiday representative and eating ? N  Using the Toilet? N  In the past six months, have you accidently leaked urine? N  Do you have problems with loss of bowel control? N  Managing your Medications? N  Managing your Finances? Y  Comment forget to pay a Civil engineer, contracting or managing your Housekeeping? N    Patient Care Team: Marin Olp, MD as PCP - General (Family Medicine) Cameron Sprang, MD as Consulting  Physician (Neurology) Madelin Rear, [facility] (Inactive) as Pharmacist (Pharmacist) Elease Hashimoto (Neurology)  Indicate any recent Medical Services you may have received from other than [facility] providers in the past year (date may be approximate).     Assessment:   This is a routine wellness examination for Rodney Scott.  Hearing/Vision screen Hearing Screening - Comments:: Pt hearing aids  Vision Screening - Comments:: Pt follows up with Dr Katy Fitch for annual eye exams   Dietary issues and exercise activities discussed: Current Exercise Habits: The patient has a physically strenuous job, but has no regular exercise apart from work.   Goals Addressed             This Visit's Progress    Patient Stated       None at this time       Depression Screen    11/21/2022    8:08 AM 09/13/2022    4:05 PM 04/22/2022    4:20 PM 12/05/2021   10:45 AM 11/16/2021    8:54 AM 04/09/2021   10:30 AM 11/10/2020    8:55 AM  PHQ 2/9 Scores  PHQ - 2 Score 0 0 3 0 0 0 0  PHQ- 9 Score  0 6 2  0     Fall Risk    11/18/2022   11:12 AM 06/07/2022    8:45 AM 11/16/2021    8:56 AM 11/09/2021    8:52 AM 05/09/2021    9:56 AM  Fall Risk   Falls in the past year? 0 0 0 0 0  Number falls in past yr: 0 0 0 0 0  Injury with Fall? 0 0 0 0 0  Risk for fall due to :   Impaired vision    Follow up   Falls prevention  discussed      FALL RISK PREVENTION PERTAINING TO THE HOME:  Any stairs in or around the home? Yes  If so, are there any without handrails? No  Home free of loose throw rugs in walkways, pet beds, electrical cords, etc? Yes  Adequate lighting in your home to reduce risk of falls? Yes   ASSISTIVE DEVICES UTILIZED TO PREVENT FALLS:  Life alert? No  Use of a cane, walker or w/c? No  Grab bars in the bathroom? No  Shower chair or bench in shower? No  Elevated toilet seat or a handicapped toilet? No   TIMED UP AND GO:  Was the test performed? Yes .  Length of time to ambulate 10 feet: 10 sec.    Gait steady and fast without use of assistive device  Cognitive Function:    06/07/2022    9:00 AM 11/09/2021   12:00 PM 09/25/2018    1:22 PM  MMSE - Mini Mental State Exam  Orientation to time 5 5 5  $ Orientation to Place 5 5 5  $ Registration 3 3 3  $ Attention/ Calculation 5 2 5  $ Recall 2 0 3  Language- name 2 objects 2 2 2  $ Language- repeat 1 1 1  $ Language- follow 3 step command 3 1 3  $ Language- read & follow direction 1 1 1  $ Write a sentence 1 1 1  $ Copy design 1 1 1  $ Total score 29 22 30      $ 04/23/2019    9:00 AM 12/14/2018    2:00 PM 09/25/2018   10:00 AM  Montreal Cognitive Assessment   Visuospatial/ Executive (0/5) 5 4 5  $ Naming (0/3) 3 3 3  $ Attention: Read list of digits (0/2) 2 2 2  $ Attention: Read list of letters (0/1) 0 1 1  Attention: Serial 7 subtraction starting at 100 (0/3) 3 3 3  $ Language: Repeat phrase (0/2) 2 2 2  $ Language : Fluency (0/1) 1 1 1  $ Abstraction (0/2) 2 2 2  $ Delayed Recall (0/5) 0 0 0  Orientation (0/6) 6 6 6  $ Total 24 24 25  $ Adjusted Score (based on education) 25 25       11/21/2022    8:12 AM 11/16/2021    8:59 AM 11/10/2020    9:06 AM  6CIT Screen  What Year? 0 points 0 points 0 points  What month? 0 points 0 points 0 points  What time? 0 points 0 points   Count back from 20 0 points 0 points 0 points  Months in reverse 0 points 0 points 0 points  Repeat phrase 0 points 2 points 0 points  Total Score 0 points 2 points     Immunizations Immunization History  Administered Date(s) Administered   Fluad Quad(high Dose 65+) 07/22/2020, 07/23/2021, 08/01/2021   Influenza Split 07/26/2011, 08/07/2012   Influenza Whole 07/31/2007, 07/15/2008, 07/07/2009, 06/29/2010   Influenza, High Dose Seasonal PF 09/08/2017, 07/15/2018, 07/10/2019   Influenza,inj,Quad PF,6+ Mos 08/20/2013, 07/26/2014, 06/15/2015, 07/05/2016   Influenza-Unspecified 07/10/2019   Moderna Covid-19 Vaccine Bivalent Booster 78yr & up 07/27/2022   PFIZER(Purple  Top)SARS-COV-2 Vaccination 11/03/2019, 11/24/2019, 07/22/2020, 03/03/2021   PNEUMOCOCCAL CONJUGATE-20 03/03/2021   Pneumococcal Conjugate-13 06/05/2017   Pneumococcal Polysaccharide-23 06/22/2010, 07/15/2018   Td 10/14/2001   Tdap 01/03/2012   Zoster Recombinat (Shingrix) 07/10/2019, 09/29/2019    TDAP status: Due, Education has been provided regarding the importance of this vaccine. Advised may receive this vaccine at local pharmacy or Health Dept. Aware to provide a copy  of the vaccination record if obtained from local pharmacy or Health Dept. Verbalized acceptance and understanding.  Flu Vaccine status: Due, Education has been provided regarding the importance of this vaccine. Advised may receive this vaccine at local pharmacy or Health Dept. Aware to provide a copy of the vaccination record if obtained from local pharmacy or Health Dept. Verbalized acceptance and understanding.  Pneumococcal vaccine status: Up to date  Covid-19 vaccine status: Completed vaccines  Qualifies for Shingles Vaccine? Yes   Zostavax completed Yes   Shingrix Completed?: Yes  Screening Tests Health Maintenance  Topic Date Due   DTaP/Tdap/Td (3 - Td or Tdap) 01/02/2022   COVID-19 Vaccine (6 - 2023-24 season) 09/21/2022   INFLUENZA VACCINE  01/12/2023 (Originally 05/14/2022)   Lung Cancer Screening  09/14/2023 (Originally 06/24/2011)   COLONOSCOPY (Pts 45-42yr Insurance coverage will need to be confirmed)  09/14/2023 (Originally 09/24/2021)   Hepatitis C Screening  11/11/2109 (Originally 01/02/1969)   Medicare Annual Wellness (AWV)  11/22/2023   Pneumonia Vaccine 72 Years old  Completed   Zoster Vaccines- Shingrix  Completed   HPV VACCINES  Aged Out    Health Maintenance  Health Maintenance Due  Topic Date Due   DTaP/Tdap/Td (3 - Td or Tdap) 01/02/2022   COVID-19 Vaccine (6 - 2023-24 season) 09/21/2022    Colonoscopy scheduled for 12/12/22  Lung Cancer Screening: (Low Dose CT Chest recommended if  Age 71-80years, 30 pack-year currently smoking OR have quit w/in 15years.) does qualify.   Lung Cancer Screening Referral: declined   Additional Screening:  Hepatitis C Screening: does qualify;   Vision Screening: Recommended annual ophthalmology exams for early detection of glaucoma and other disorders of the eye. Is the patient up to date with their annual eye exam?  No  Who is the provider or what is the name of the office in which the patient attends annual eye exams? Dr GKaty Fitch If pt is not established with a provider, would they like to be referred to a provider to establish care? No .   Dental Screening: Recommended annual dental exams for proper oral hygiene  Community Resource Referral / Chronic Care Management: CRR required this visit?  No   CCM required this visit?  No      Plan:     I have personally reviewed and noted the following in the patient's chart:   Medical and social history Use of alcohol, tobacco or illicit drugs  Current medications and supplements including opioid prescriptions. Patient is not currently taking opioid prescriptions. Functional ability and status Nutritional status Physical activity Advanced directives List of other physicians Hospitalizations, surgeries, and ER visits in previous 12 months Vitals Screenings to include cognitive, depression, and falls Referrals and appointments  In addition, I have reviewed and discussed with patient certain preventive protocols, quality metrics, and best practice recommendations. A written personalized care plan for preventive services as well as general preventive health recommendations were provided to patient.     TWillette Brace LPN   [id number]  Nurse Notes: none

## 2022-11-30 ENCOUNTER — Other Ambulatory Visit: Payer: Self-pay | Admitting: Family Medicine

## 2022-12-06 ENCOUNTER — Ambulatory Visit (INDEPENDENT_AMBULATORY_CARE_PROVIDER_SITE_OTHER): Payer: BC Managed Care – PPO | Admitting: Physician Assistant

## 2022-12-06 VITALS — BP 94/61 | HR 68 | Resp 18 | Ht 75.0 in | Wt 225.0 lb

## 2022-12-06 DIAGNOSIS — R413 Other amnesia: Secondary | ICD-10-CM

## 2022-12-06 DIAGNOSIS — G3184 Mild cognitive impairment, so stated: Secondary | ICD-10-CM | POA: Diagnosis not present

## 2022-12-06 DIAGNOSIS — R4189 Other symptoms and signs involving cognitive functions and awareness: Secondary | ICD-10-CM

## 2022-12-06 MED ORDER — MEMANTINE HCL 5 MG PO TABS: ORAL_TABLET | ORAL | 11 refills | Status: DC

## 2022-12-06 NOTE — Progress Notes (Signed)
Assessment/Plan:   Mild cognitive impairment likely due to Alzheimer's disease  Rodney Scott is a very pleasant 72 y.o. RH malewith a history of hypertension with recent hypotensive event, hyperlipidemia, Factor V Leiden deficiency, DVT on Xarelto  presenting today in follow-up for evaluation of memory loss.  He carries a diagnosis of mild cognitive impairment likely due to Alzheimer's disease as per neuropsychological evaluation on 06/03/2022.  Patient was on donepezil 10 mg daily.  Personally reviewed of the brain from 08/17/2022 is remarkable for mild chronic small vessel ischemic changes within the cerebral white matter and pons, slightly progressed from the prior brain MRI of 2020.  Mild generalized cerebral atrophy was noted but is stable.  MMSE today is 25/30, slight decline compared to prior.   Recommendations:   Follow up in 6  months. Repeat neuropsychological evaluation to assess the trajectory of future cognitive decline and diagnostic clarity Continue  donepezil 10 mg  daily, side effects discussed  Memantine 5 mg nightly, side effects discussed Recommend good control of cardiovascular risk factors Mood control as per PCP     Subjective:   This patient is accompanied in the office by his wife  who supplements the history. Previous records as well as any outside records available were reviewed prior to todays visit.   Patient was last seen on 06/07/2022 at which time his MMSE was 29/30    Any changes in memory since last visit?  His wife has to repeat several times today schedule.  She also reports that his thoughts seem to be random, not connected to the context of the conversation.  He also has difficulty remembering these conversations and names, new information .  She also states that he is handwriting is different, sloppy.  He continues to work. repeats oneself?  Endorsed, worse than prior Disoriented when walking into a room?  Patient denies  Leaving objects in  unusual places?  He misplaces things but not in unusual areas.  Wandering behavior?   denies   Any personality changes since last visit?   denies   Any worsening depression?: denies. Controlled with meds.    Hallucinations or paranoia?  denies   Seizures?   denies    Any sleep changes?  He sleeps well but during the weekend he may sleep up to 12 hours.  + vivid dreams, denies REM behavior or sleepwalking   Sleep apnea?   denies   Any hygiene concerns?   denies   Independent of bathing and dressing?  Endorsed  Does the patient needs help with medications?  Patient is in charge  Who is in charge of the finances?  Wife is in charge since he forgot several bills to pay    Any changes in appetite?  denies     Patient have trouble swallowing?  denies   Does the patient cook?   Only breakfast  Any kitchen accidents such as leaving the stove on? Occasionally  Any headaches?    denies   Vision changes? denies Chronic back pain  denies   Ambulates with difficulty?    denies   Recent falls or head injuries?    denies     Unilateral weakness, numbness or tingling?   denies   Any tremors?  denies   Any anosmia?  Endorsed, for the last 3 or 4 years Any incontinence of urine?  denies   Any bowel dysfunction?  denies      Patient lives  wife and granddaughter  Does the  patient drive? He uses the GPS, he drives only locally.    History on Initial Assessment 09/25/2018: This is a 72 year old right-handed man with a history of hypertension, hyperlipidemia, Factor V Leiden deficiency, DVT on Xarelto, presenting for evaluation of worsening memory. He and his wife started noticing changes over the past year. He states long-term memory is much better than remembering what he did yesterday. He misplaces things frequently at home, they have lived in the same house for 30 years and his wife has to search for things that have always been in the same place for years. He denies getting lost driving but uses his  GPS a lot, he cannot remember the way to places like before. He procrastinates on bills but generally does not miss bills, his wife nods that he has. He occasionally forgets his medications. 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 His wife's major concern is how this has affected caring for their 21 year old granddaughter who has been living with them for the past 5 years. He forgets to feed her, which is new. He goes to a store and forgets what he is doing, she has to give him a pretty detailed note so he can remember events for the day. She has noticed his language skills have decreased markedly, he is not a very verbal person, but she has noticed more difficulty expressing his thoughts or finding words. He may have a thought but stumbles over the words. He repeats himself, one time last summer he told the same story 3-4 times within half an hour. He has always been easily frustrated, but she feels it is worse, such as when driving, "no one knows what they are doing." No paranoia or hallucinations.    He denies any headaches, dizziness, vision changes, neck/back pain, focal numbness/tingling/weakness, bowel/bladder dysfunction, or tremors. He has lost his sense of smell over the past year for no clear reason. He denies any head injuries. His mother had dementia in her 62s. 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 When asked about mood, he states "I don't know." His wife does not think he is depressed, she is unsure why he is still taking the Wellbutrin and Pristiq, she feels Wellbutrin was started 20 years ago for smoking cessation.    Diagnostic Data: MRI brain with and without contrast in 10/2018 no acute changes, there is mild chronic microvascular disease.   MRI face/trigeminal with and without  contrast done 11/30/2020 which did not show any compressive lesion/vascular abnormality on the left. There were degenerative changes in the temporomandibular joints and cervical spine.   Neuropsychological evaluation at [facility] in January 2020 indicated very mild behaviorally observed expressive speech difficulty and select executive dysfunction, diagnosis of unspecified Mild Cognitive Impairment (possibly vascular), unspecified depressive disorder. It was noted that his only language deficit included behaviorally observed occasional use of filler words and difficulty verbally describing some experiences. All objective language testing was within normal limits, no expressive language order at this time   Neuropsychological testing in March 2021 did not show any appreciable change from prior testing with respect to memory affecting learning and retention of both verbal and visual information. Profile concerning for a developing progressive aphasia condition although he was not demonstrating frank language impairments on testing.  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 He otherwise did well, still meeting criteria for Mild Neurocognitive disorder, with very mild declines compared to prior testing, improvements in some tasks. Etiology remains unclear, still concerning for a degenerative process affecting memory processes such as Alzheimer's disease however trajectory was inconsistent with the improvements noted on his testing. Primary progressive aphasia could not be ruled out, but test results did not strongly suggest this currently.           Neuropsychological Evaluation , Dr. Melvyn Novas  06/03/22. " Briefly, results suggested  prominent impairment surrounding all aspects of verbal memory, as well as recognition/consolidation aspects  of visual memory. Writing samples also exhibited impairment with evidence for agrammatism; however, other tasks assessing receptive and expressive language remained normatively appropriate. A relative weakness was exhibited across executive functioning, while performance variability was exhibited across processing speed. Relative to his previous evaluation in April 2022, mild performance declines were exhibited across processing speed, executive functioning, and verbal memory. As stated in previous reports, Alzheimer's disease remains a concern. Despite some evidence to suggest adequate encoding (i.e., learning), retention rates were essentially amnestic across verbal tasks. He also performed poorly across all recognition tasks. This suggests evidence for rapid forgetting and an evolving and already quite significant memory storage impairment, both of which are the hallmark characteristics of this illness. The fact that declines have been quite mild over time is certainly encouraging and would suggest a slowed disease process if truly present.    Past Medical History:  Diagnosis Date   Allergic rhinitis 07/31/2007   Loratadine at times- but mild mental fog on this    BPH associated with nocturia 03/10/2018   Clotting disorder    Current smoker 07/26/2014   Has quit several several times and restarted. Most recently quit 11/06/14. Need to keep a close eye.    Essential hypertension, benign 07/26/2014   Benazepril '20mg'$     Factor V Leiden 07/31/2007   Xarelto '20mg'$  daily. 2 DVT 2001, PE 2011.     History of colonic polyps 07/31/2007   02/2015 adenoma. 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 Patient states thought was external.      Left inguinal hernia 12/05/2016   Declines surgery referral- minimally bothering him. Can call in if he changes his mind    Major depressive disorder in full remission 03/24/2009   Wellbutrin xl '300mg'$ -->'150mg'$ --> off 03/2020  pristiq '50mg'$ .   In past-->remeron '15mg'$  mainly takes prn for sleep zoloft --> pristiq per sister who is psychiatrist apparetnly   MCI (mild cognitive impairment) 06/15/2015   Osteoarthritis    Right rotator cuff tear 12/07/2010   S/p surgery.     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 COmpression stocking on left. L >R swelling-history of 1st DVT in L.        Past Surgical History:  Procedure Laterality Date   right rotator cuff repair     SALIVARY STONE REMOVAL       PREVIOUS MEDICATIONS:   CURRENT MEDICATIONS:  Outpatient Encounter Medications as of 12/06/2022  Medication Sig   benazepril (  LOTENSIN) 20 MG tablet Take 20 mg by mouth daily.   desvenlafaxine (PRISTIQ) 25 MG 24 hr tablet Take 1 tablet (25 mg total) by mouth daily.   donepezil (ARICEPT) 10 MG tablet Take 10 mg by mouth at bedtime.   Loratadine 10 MG CAPS Take by mouth daily.   memantine (NAMENDA) 5 MG tablet Take 1 tablet (5 mg at night) for 2 weeks, then increase to 1 tablet (5 mg) twice a day   Multiple Vitamin (MULTIVITAMIN) capsule Take by mouth.   rosuvastatin (CRESTOR) 40 MG tablet Take 1 tablet by mouth once daily   XARELTO 20 MG TABS tablet Take 1 tablet by mouth once daily   No facility-administered encounter medications on file as of 12/06/2022.     Objective:     PHYSICAL EXAMINATION:    VITALS:   Vitals:   12/06/22 0852  BP: 94/61  Pulse: 68  Resp: 18  SpO2: 95%  Weight: 225 lb (102.1 kg)  Height: '6\' 3"'$  (1.905 m)    GEN:  The patient appears stated age and is in NAD. HEENT:  Normocephalic, atraumatic.   Neurological examination:  General: NAD, well-groomed, appears stated age. Orientation: The patient is alert. Oriented to person, place and date Cranial  nerves: There is good facial symmetry.The speech is fluent and clear. No aphasia or dysarthria. Fund of knowledge is appropriate. Recent memory impaired and remote memory is normal.  Attention and concentration are reduced.  Able to name objects and repeat phrases.  Hearing is intact to conversational tone.   Delayed recall 0/3 Sensation: Sensation is intact to light touch throughout Motor: Strength is at least antigravity x4. Tremors: none  DTR's 2/4 in UE/LE      04/23/2019    9:00 AM 12/14/2018    2:00 PM 09/25/2018   10:00 AM  Montreal Cognitive Assessment   Visuospatial/ Executive (0/5) '5 4 5  '$ Naming (0/3) '3 3 3  '$ Attention: Read list of digits (0/2) '2 2 2  '$ Attention: Read list of letters (0/1) 0 1 1  Attention: Serial 7 subtraction starting at 100 (0/3) '3 3 3  '$ Language: Repeat phrase (0/2) '2 2 2  '$ Language : Fluency (0/1) '1 1 1  '$ Abstraction (0/2) '2 2 2  '$ Delayed Recall (0/5) 0 0 0  Orientation (0/6) '6 6 6  '$ Total '24 24 25  '$ Adjusted Score (based on education) 25 25        12/06/2022   12:00 PM 06/07/2022    9:00 AM 11/09/2021   12:00 PM  MMSE - Mini Mental State Exam  Orientation to time '5 5 5  '$ Orientation to Place '5 5 5  '$ Registration '3 3 3  '$ Attention/ Calculation '4 5 2  '$ Recall 0 2 0  Language- name 2 objects '2 2 2  '$ Language- repeat '1 1 1  '$ Language- follow 3 step command '3 3 1  '$ Language- read & follow direction '1 1 1  '$ Write a sentence 0 1 1  Copy design '1 1 1  '$ Total score '25 29 22       '$ Movement examination: Tone: There is normal tone in the UE/LE Abnormal movements:  no tremor.  No myoclonus.  No asterixis.   Coordination:  There is no decremation with RAM's. Normal finger to nose  Gait and Station: The patient has no difficulty arising out of a deep-seated chair without the use of the hands. The patient's stride length is good.  Gait is cautious and narrow.   Thank you  for allowing [place] the opportunity to participate in the care of this nice patient. Please do not  hesitate to contact us for any questions or concerns.   Total time spent on today's visit was 25 minutes dedicated to this patient today, preparing to see patient, examining the patient, ordering tests and/or medications and counseling the patient, documenting clinical information in the EHR or other health record, independently interpreting results and communicating results to the patient/family, discussing treatment and goals, answering patient's questions and coordinating care.  Cc:  Marin Olp, MD  Sharene Butters 12/06/2022 12:13 PM

## 2022-12-06 NOTE — Patient Instructions (Signed)
Good to see you.  Continue Donepezil '10mg'$  daily.  Start memantine 5 mg at night, for 2 weeks then increase to 5 mg twice a day  Repeat Neuropsych testing for clarity of diagnosis and disease trajectory   Follow-up in 6 months     FALL PRECAUTIONS: Be cautious when walking. Scan the area for obstacles that may increase the risk of trips and falls. When getting up in the mornings, sit up at the edge of the bed for a few minutes before getting out of bed. Consider elevating the bed at the head end to avoid drop of blood pressure when getting up. Walk always in a well-lit room (use night lights in the walls). Avoid area rugs or power cords from appliances in the middle of the walkways. Use a walker or a cane if necessary and consider physical therapy for balance exercise. Get your eyesight checked regularly.  FINANCIAL OVERSIGHT: Supervision, especially oversight when making financial decisions or transactions is also recommended as difficulties arise.  HOME SAFETY: Consider the safety of the kitchen when operating appliances like stoves, microwave oven, and blender. Consider having supervision and share cooking responsibilities until no longer able to participate in those. Accidents with firearms and other hazards in the house should be identified and addressed as well.  DRIVING: Regarding driving, in patients with progressive memory problems, driving will be impaired. We advise to have someone else do the driving if trouble finding directions or if minor accidents are reported. Independent driving assessment is available to determine safety of driving.  ABILITY TO BE LEFT ALONE: If patient is unable to contact 911 operator, consider using LifeLine, or when the need is there, arrange for someone to stay with patients. Smoking is a fire hazard, consider supervision or cessation. Risk of wandering should be assessed by caregiver and if detected at any point, supervision and safe proof recommendations  should be instituted.  MEDICATION SUPERVISION: Inability to self-administer medication needs to be constantly addressed. Implement a mechanism to ensure safe administration of the medications.  RECOMMENDATIONS FOR ALL PATIENTS WITH MEMORY PROBLEMS: 1. Continue to exercise (Recommend 30 minutes of walking everyday, or 3 hours every week) 2. Increase social interactions - continue going to [place] and enjoy social gatherings with friends and family 3. Eat healthy, avoid fried foods and eat more fruits and vegetables 4. Maintain adequate blood pressure, blood sugar, and blood cholesterol level. Reducing the risk of stroke and cardiovascular disease also helps promoting better memory. 5. Avoid stressful situations. Live a simple life and avoid aggravations. Organize your time and prepare for the next day in anticipation. 6. Sleep well, avoid any interruptions of sleep and avoid any distractions in the bedroom that may interfere with adequate sleep quality 7. Avoid sugar, avoid sweets as there is a strong link between excessive sugar intake, diabetes, and cognitive impairment We discussed the Mediterranean diet, which has been shown to help patients reduce the risk of progressive memory disorders and reduces cardiovascular risk. This includes eating fish, eat fruits and green leafy vegetables, nuts like almonds and hazelnuts, walnuts, and also use olive oil. Avoid fast foods and fried foods as much as possible. Avoid sweets and sugar as sugar use has been linked to worsening of memory function.      Mediterranean Diet  Why follow it? Research shows. Those who follow the Mediterranean diet have a reduced risk of heart disease  The diet is associated with a reduced incidence of Parkinson's and Alzheimer's diseases People following the diet  may have longer life expectancies and lower rates of chronic diseases  The Dietary Guidelines for Americans recommends the Mediterranean diet as an eating plan to  promote health and prevent disease  What Is the Mediterranean Diet?  Healthy eating plan based on typical foods and recipes of Mediterranean-style cooking The diet is primarily a plant based diet; these foods should make up a majority of meals   Starches - Plant based foods should make up a majority of meals - They are an important sources of vitamins, minerals, energy, antioxidants, and fiber - Choose whole grains, foods high in fiber and minimally processed items  - Typical grain sources include wheat, oats, barley, corn, brown rice, bulgar, farro, millet, polenta, couscous  - Various types of beans include chickpeas, lentils, fava beans, black beans, white beans   Fruits  Veggies - Large quantities of antioxidant rich fruits & veggies; 6 or more servings  - Vegetables can be eaten raw or lightly drizzled with oil and cooked  - Vegetables common to the traditional Mediterranean Diet include: artichokes, arugula, beets, broccoli, brussel sprouts, cabbage, carrots, celery, collard greens, cucumbers, eggplant, kale, leeks, lemons, lettuce, mushrooms, okra, onions, peas, peppers, potatoes, pumpkin, radishes, rutabaga, shallots, spinach, sweet potatoes, turnips, zucchini - Fruits common to the Mediterranean Diet include: apples, apricots, avocados, cherries, clementines, dates, figs, grapefruits, grapes, melons, nectarines, oranges, peaches, pears, pomegranates, strawberries, tangerines  Fats - Replace butter and margarine with healthy oils, such as olive oil, canola oil, and tahini  - Limit nuts to no more than a handful a day  - Nuts include walnuts, almonds, pecans, pistachios, pine nuts  - Limit or avoid candied, honey roasted or heavily salted nuts - Olives are central to the [employer] - can be eaten whole or used in a variety of dishes   Meats Protein - Limiting red meat: no more than a few times a month - When eating red meat: choose lean cuts and keep the portion to the size of  deck of cards - Eggs: approx. 0 to 4 times a week  - Fish and lean poultry: at least 2 a week  - Healthy protein sources include, chicken, [place], lean beef, lamb - Increase intake of seafood such as tuna, salmon, trout, mackerel, shrimp, scallops - Avoid or limit high fat processed meats such as sausage and bacon  Dairy - Include moderate amounts of low fat dairy products  - Focus on healthy dairy such as fat free yogurt, skim milk, low or reduced fat cheese - Limit dairy products higher in fat such as whole or 2% milk, cheese, ice cream  Alcohol - Moderate amounts of red wine is ok  - No more than 5 oz daily for women (all ages) and men older than age 53  - No more than 10 oz of wine daily for men younger than 14  Other - Limit sweets and other desserts  - Use herbs and spices instead of salt to flavor foods  - Herbs and spices common to the traditional Mediterranean Diet include: basil, bay leaves, chives, cloves, cumin, fennel, garlic, lavender, marjoram, mint, oregano, parsley, pepper, rosemary, sage, savory, sumac, tarragon, thyme   It's not just a diet, it's a lifestyle:  The Mediterranean diet includes lifestyle factors typical of those in the region  Foods, drinks and meals are best eaten with others and savored Daily physical activity is important for overall good health This could be strenuous exercise like running and aerobics This could also be  more leisurely activities such as walking, housework, yard-work, or taking the stairs Moderation is the key; a balanced and healthy diet accommodates most foods and drinks Consider portion sizes and frequency of consumption of certain foods   Meal Ideas & Options:  Breakfast:  Whole wheat toast or whole wheat English muffins with peanut butter & hard boiled egg Steel cut oats topped with apples & cinnamon and skim milk  Fresh fruit: banana, strawberries, melon, berries, peaches  Smoothies: strawberries, bananas, greek yogurt, peanut  butter Low fat greek yogurt with blueberries and granola  Egg white omelet with spinach and mushrooms Breakfast couscous: whole wheat couscous, apricots, skim milk, cranberries  Sandwiches:  Hummus and grilled vegetables (peppers, zucchini, squash) on whole wheat bread   Grilled chicken on whole wheat pita with lettuce, tomatoes, cucumbers or tzatziki  [place] salad on whole wheat bread: tuna salad made with greek yogurt, olives, red peppers, capers, green onions Garlic rosemary lamb pita: lamb sauted with garlic, rosemary, salt & pepper; add lettuce, cucumber, greek yogurt to pita - flavor with lemon juice and black pepper  Seafood:  Mediterranean grilled salmon, seasoned with garlic, basil, parsley, lemon juice and black pepper Shrimp, lemon, and spinach whole-grain pasta salad made with low fat greek yogurt  Seared scallops with lemon orzo  Seared tuna steaks seasoned salt, pepper, coriander topped with tomato mixture of olives, tomatoes, olive oil, minced garlic, parsley, green onions and cappers  Meats:  Herbed greek chicken salad with kalamata olives, cucumber, feta  Red bell peppers stuffed with spinach, bulgur, lean ground beef (or lentils) & topped with feta   Kebabs: skewers of chicken, tomatoes, onions, zucchini, squash  [place] burgers: made with red onions, mint, dill, lemon juice, feta cheese topped with roasted red peppers Vegetarian Cucumber salad: cucumbers, artichoke hearts, celery, red onion, feta cheese, tossed in olive oil & lemon juice  Hummus and whole grain pita points with a greek salad (lettuce, tomato, feta, olives, cucumbers, red onion) Lentil soup with celery, carrots made with vegetable broth, garlic, salt and pepper  Tabouli salad: parsley, bulgur, mint, scallions, cucumbers, tomato, radishes, lemon juice, olive oil, salt and pepper.

## 2022-12-09 ENCOUNTER — Other Ambulatory Visit: Payer: Self-pay | Admitting: Family Medicine

## 2022-12-11 ENCOUNTER — Ambulatory Visit (AMBULATORY_SURGERY_CENTER): Payer: BC Managed Care – PPO | Admitting: Gastroenterology

## 2022-12-11 ENCOUNTER — Encounter: Payer: Self-pay | Admitting: Gastroenterology

## 2022-12-11 VITALS — BP 98/64 | HR 71 | Temp 96.6°F | Resp 13 | Ht 75.0 in | Wt 216.2 lb

## 2022-12-11 DIAGNOSIS — Z800 Family history of malignant neoplasm of digestive organs: Secondary | ICD-10-CM

## 2022-12-11 DIAGNOSIS — Z86010 Personal history of colonic polyps: Secondary | ICD-10-CM | POA: Diagnosis not present

## 2022-12-11 DIAGNOSIS — D123 Benign neoplasm of transverse colon: Secondary | ICD-10-CM | POA: Diagnosis not present

## 2022-12-11 DIAGNOSIS — Z09 Encounter for follow-up examination after completed treatment for conditions other than malignant neoplasm: Secondary | ICD-10-CM | POA: Diagnosis not present

## 2022-12-11 DIAGNOSIS — D125 Benign neoplasm of sigmoid colon: Secondary | ICD-10-CM | POA: Diagnosis not present

## 2022-12-11 DIAGNOSIS — I10 Essential (primary) hypertension: Secondary | ICD-10-CM | POA: Diagnosis not present

## 2022-12-11 DIAGNOSIS — K635 Polyp of colon: Secondary | ICD-10-CM

## 2022-12-11 DIAGNOSIS — E669 Obesity, unspecified: Secondary | ICD-10-CM | POA: Diagnosis not present

## 2022-12-11 MED ORDER — SODIUM CHLORIDE 0.9 % IV SOLN: 500.0000 mL | Freq: Once | INTRAVENOUS | Status: DC

## 2022-12-11 NOTE — Op Note (Signed)
[facility] Patient Name: Rodney Scott Procedure Date: 12/11/2022 9:07 AM MRN: [id number] Endoscopist: Ladene Artist , MD, [id number] Age: 72 Referring MD:  Date of Birth: August 21, 1951 Gender: Male Account #: [id number] Procedure:                Colonoscopy Indications:              Surveillance: Personal history of adenomatous                            polyps on last colonoscopy 5 years ago Medicines:                Monitored Anesthesia Care Procedure:                Pre-Anesthesia Assessment:                           - Prior to the procedure, a History and Physical                            was performed, and patient medications and                            allergies were reviewed. The patient's tolerance of                            previous anesthesia was also reviewed. The risks                            and benefits of the procedure and the sedation                            options and risks were discussed with the patient.                            All questions were answered, and informed consent                            was obtained. Prior Anticoagulants: The patient has                            taken Xarelto (rivaroxaban), last dose was 1 day                            prior to procedure. ASA Grade Assessment: II - A                            patient with mild systemic disease. After reviewing                            the risks and benefits, the patient was deemed in                            satisfactory condition to undergo the procedure.  After obtaining informed consent, the colonoscope                            was passed under direct vision. Throughout the                            procedure, the patient's blood pressure, pulse, and                            oxygen saturations were monitored continuously. The                            CF [id number] was introduced through the anus                             and advanced to the the cecum, identified by                            appendiceal orifice and ileocecal valve. The                            ileocecal valve, appendiceal orifice, and rectum                            were photographed. The quality of the bowel                            preparation was good. The colonoscopy was performed                            without difficulty. The patient tolerated the                            procedure well. Scope In: 9:17:29 AM Scope Out: 9:36:58 AM Scope Withdrawal Time: 0 hours 16 minutes 26 seconds  Total Procedure Duration: 0 hours 19 minutes 29 seconds  Findings:                 The perianal and digital rectal examinations were                            normal.                           A 10 mm polyp was found in the sigmoid colon. The                            polyp was semi-pedunculated. The polyp was removed                            with a hot snare. Resection and retrieval were                            complete. To prevent bleeding after the  polypectomy, one hemostatic clip was successfully                            placed (MR conditional). Clip manufacturer:                            [employer]. There was no bleeding during the                            maneuver.                           Four sessile polyps were found in the transverse                            colon. The polyps were 5 to 7 mm in size. These                            polyps were removed with a cold snare. Resection                            and retrieval were complete.                           Many small-mouthed diverticula were found in the                            left colon. There was no evidence of diverticular                            bleeding.                           Internal hemorrhoids were found during                            retroflexion. The hemorrhoids were small and Grade                            I  (internal hemorrhoids that do not prolapse).                           The exam was otherwise without abnormality on                            direct and retroflexion views. Complications:            No immediate complications. Estimated blood loss:                            None. Estimated Blood Loss:     Estimated blood loss: none. Impression:               - One 10 mm polyp in the sigmoid colon, removed  with a hot snare. Resected and retrieved. Clip (MR                            conditional) was placed. Clip manufacturer: [employer].                           - Four 5 to 7 mm polyps in the transverse colon,                            removed with a cold snare. Resected and retrieved.                           - Moderate diverticulosis in the left colon.                           - Internal hemorrhoids.                           - The examination was otherwise normal on direct                            and retroflexion views. Recommendation:           - Repeat colonoscopy after studies are complete for                            surveillance based on pathology results.                           - Resume Xarelto (rivaroxaban) in 3 days at prior                            dose. Refer to managing physician for further                            adjustment of therapy.                           - Patient has a contact number available for                            emergencies. The signs and symptoms of potential                            delayed complications were discussed with the                            patient. Return to normal activities tomorrow.                            Written discharge instructions were provided to the                            patient.                           -  High fiber diet.                           - Continue present medications.                           - Await pathology results.                           - No aspirin,  ibuprofen, naproxen, or other                            non-steroidal anti-inflammatory drugs for 2 weeks                            after polyp removal. Ladene Artist, MD 12/11/2022 9:43:03 AM This report has been signed electronically.

## 2022-12-11 NOTE — Progress Notes (Signed)
Report to PACU, RN, vss, BBS= Clear.  

## 2022-12-11 NOTE — Progress Notes (Signed)
History & Physical  Primary Care Physician:  Marin Olp, MD Primary Gastroenterologist: Lucio Edward, MD  Impression / Plan:  Personal history of adenomatous colon polyps, family history of colon cancer, first-degree relative for colonoscopy.  CHIEF COMPLAINT:  [facility], Personal history of colon polyps   HPI: Rodney Scott is a 72 y.o. male with a personal history of adenomatous colon polyps, family history of colon cancer, first-degree relative for colonoscopy.   Past Medical History:  Diagnosis Date   Allergic rhinitis 07/31/2007   Loratadine at times- but mild mental fog on this    BPH associated with nocturia 03/10/2018   Clotting disorder    Current smoker 07/26/2014   Has quit several several times and restarted. Most recently quit 11/06/14. Need to keep a close eye.    Essential hypertension, benign 07/26/2014   Benazepril '20mg'$     Factor V Leiden 07/31/2007   Xarelto '20mg'$  daily. 2 DVT 2001, PE 2011.     History of colonic polyps 07/31/2007   02/2015 adenoma. 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 Patient states thought was external.      Left inguinal hernia 12/05/2016   Declines surgery referral- minimally bothering him. Can call in if he changes his mind   Major depressive disorder in full remission 03/24/2009   Wellbutrin xl '300mg'$ -->'150mg'$ --> off 03/2020  pristiq '50mg'$ .   In past-->remeron '15mg'$  mainly takes prn for sleep zoloft --> pristiq per sister who is psychiatrist apparetnly   MCI (mild cognitive impairment) 06/15/2015   Osteoarthritis    Right rotator cuff tear 12/07/2010   S/p surgery.     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 COmpression stocking on left. L >R  swelling-history of 1st DVT in L.       Past Surgical History:  Procedure Laterality Date   COLONOSCOPY     right rotator cuff repair     SALIVARY STONE REMOVAL      Prior to Admission medications   Medication Sig Start Date End Date Taking? Authorizing Provider  donepezil (ARICEPT) 10 MG tablet Take 10 mg by mouth at bedtime.   Yes [provider]  Loratadine 10 MG CAPS Take by mouth daily.   Yes [provider]  memantine (NAMENDA) 5 MG tablet Take 1 tablet (5 mg at night) for 2 weeks, then increase to 1 tablet (5 mg) twice a day 12/06/22  Yes Wertman, Coralee Pesa, PA-C  Multiple Vitamin (MULTIVITAMIN) capsule Take by mouth.   Yes [provider]  rosuvastatin (CRESTOR) 40 MG tablet Take 1 tablet by mouth once daily 12/03/22  Yes Marin Olp, MD  XARELTO 20 MG TABS tablet Take 1 tablet by mouth once daily 12/09/22  Yes Marin Olp, MD  benazepril (LOTENSIN) 20 MG tablet Take 20 mg by mouth daily. 11/05/22   [provider]  desvenlafaxine (PRISTIQ) 25 MG 24 hr tablet Take 1 tablet (25 mg total) by mouth daily. 09/26/22   Marin Olp, MD    Current Outpatient Medications  Medication Sig Dispense Refill   donepezil (ARICEPT) 10 MG tablet Take 10 mg by mouth at bedtime.     Loratadine 10 MG CAPS Take by mouth daily.  memantine (NAMENDA) 5 MG tablet Take 1 tablet (5 mg at night) for 2 weeks, then increase to 1 tablet (5 mg) twice a day 60 tablet 11   Multiple Vitamin (MULTIVITAMIN) capsule Take by mouth.     rosuvastatin (CRESTOR) 40 MG tablet Take 1 tablet by mouth once daily 90 tablet 0   XARELTO 20 MG TABS tablet Take 1 tablet by mouth once daily 90 tablet 0   benazepril (LOTENSIN) 20 MG tablet Take 20 mg by mouth daily.     desvenlafaxine (PRISTIQ) 25 MG 24 hr tablet Take 1 tablet (25 mg total) by mouth daily. 90 tablet 3   Current Facility-Administered Medications  Medication Dose Route Frequency Provider Last Rate Last Admin   0.9  %  sodium chloride infusion  500 mL Intravenous Once Ladene Artist, MD        Allergies as of 12/11/2022 - Review Complete 12/11/2022  Allergen Reaction Noted   Yellow jacket venom [bee venom] Shortness Of Breath 04/10/2012    Family History  Problem Relation Age of Onset   Memory loss Mother    Colon polyps Father    Colon cancer Father 73   Factor V Leiden deficiency Son    Esophageal cancer Neg Hx    Rectal cancer Neg Hx    Stomach cancer Neg Hx     Social History   Socioeconomic History   Marital status: Married    Spouse name: Not on file   Number of children: 2   Years of education: 12   Highest education level: High school graduate  Occupational History   Occupation: Maintenance  Tobacco Use   Smoking status: Every Day    Packs/day: 1.00    Years: 45.00    Total pack years: 45.00    Types: Cigarettes   Smokeless tobacco: Never  Vaping Use   Vaping Use: Never used  Substance and Sexual Activity   Alcohol use: Yes    Comment: rare alcohol consumption   Drug use: No   Sexual activity: Yes  Other Topics Concern   Not on file  Social History Narrative   Married (wife patient of Dr. Yong Channel). Son and daughter. 1 granddaughter.       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      Hobbies: time around house, time with family, yardwork, some [place] in 2 story home with wife and granddaughter, [employer] geologist school graduate      Right handed    Social Determinants of Health   Financial Resource Strain: [place]  (11/18/2022)   Overall Financial Resource Strain (CARDIA)    Difficulty of Paying Living Expenses: Not hard at all  Food Insecurity: No Food Insecurity (11/18/2022)   Hunger Vital Sign    Worried About Running Out of Food in the Last Year: Never true    [place] in the Last Year: Never true  Transportation Needs: No Transportation Needs  (11/18/2022)   PRAPARE - Hydrologist (Medical): No    Lack of Transportation (Non-Medical): No  Physical Activity: Inactive (11/18/2022)   Exercise Vital Sign    Days of Exercise per Week: 0 days    Minutes of Exercise per Session: 0 min  Stress: No Stress Concern Present (11/18/2022)   [place]  of Stress : Only a little  Social Connections: Unknown (11/18/2022)   Social Connection and Isolation Panel [NHANES]    Frequency of Communication with Friends and Family: Once a week    Frequency of Social Gatherings with Friends and Family: Once a week    Attends Religious Services: Not on Advertising copywriter or Organizations: No    Attends Archivist Meetings: Never    Marital Status: Married  Human resources officer Violence: Not At Risk (11/21/2022)   Humiliation, Afraid, Rape, and Kick questionnaire    Fear of Current or Ex-Partner: No    Emotionally Abused: No    Physically Abused: No    Sexually Abused: No    Review of Systems:  All systems reviewed were negative except where noted in HPI.   Physical Exam: General:  Alert, well-developed, in NAD Head:  Normocephalic and atraumatic. Eyes:  Sclera clear, no icterus.   Conjunctiva pink. Ears:  Normal auditory acuity. Mouth:  No deformity or lesions.  Neck:  Supple; no masses. Lungs:  Clear throughout to auscultation.   No wheezes, crackles, or rhonchi.  Heart:  Regular rate and rhythm; no murmurs. Abdomen:  Soft, nondistended, nontender. No masses, hepatomegaly. No palpable masses.  Normal bowel sounds.    Rectal:  Deferred   Msk:  Symmetrical without gross deformities. Extremities:  Without edema. Neurologic:  Alert and  oriented x 4; grossly normal neurologically. Skin:  Intact without significant lesions or rashes. Psych:  Alert and cooperative. Normal mood and affect.   Pricilla Riffle. Fuller Plan  12/11/2022, 9:06  AM See Shea Evans, [facility], to contact our on call provider

## 2022-12-11 NOTE — Progress Notes (Signed)
VS completed by DT.  Pt's states no medical or surgical changes since previsit or office visit.  

## 2022-12-11 NOTE — Patient Instructions (Signed)
Information on polyps given to you today.  Await pathology results.  Resume previous diet and medications.  Resume Xarelto at prior dose in 3 days.   Avoid NSAIDS (Aspirin, Ibuprofen, Aleve, Naproxen) for 2 weeks post polyp removal, you may use Tylenol as needed.   YOU HAD AN ENDOSCOPIC PROCEDURE TODAY AT [facility]:   Refer to the procedure report that was given to you for any specific questions about what was found during the examination.  If the procedure report does not answer your questions, please call your gastroenterologist to clarify.  If you requested that your care partner not be given the details of your procedure findings, then the procedure report has been included in a sealed envelope for you to review at your convenience later.  YOU SHOULD EXPECT: Some feelings of bloating in the abdomen. Passage of more gas than usual.  Walking can help get rid of the air that was put into your GI tract during the procedure and reduce the bloating. If you had a lower endoscopy (such as a colonoscopy or flexible sigmoidoscopy) you may notice spotting of blood in your stool or on the toilet paper. If you underwent a bowel prep for your procedure, you may not have a normal bowel movement for a few days.  Please Note:  You might notice some irritation and congestion in your nose or some drainage.  This is from the oxygen used during your procedure.  There is no need for concern and it should clear up in a day or so.  SYMPTOMS TO REPORT IMMEDIATELY:  Following lower endoscopy (colonoscopy or flexible sigmoidoscopy):  Excessive amounts of blood in the stool  Significant tenderness or worsening of abdominal pains  Swelling of the abdomen that is new, acute  Fever of 100F or higher   For urgent or emergent issues, a gastroenterologist can be reached at any hour by calling [phone]. Do not use MyChart messaging for urgent concerns.    DIET:  We do recommend a small meal  at first, but then you may proceed to your regular diet.  Drink plenty of fluids but you should avoid alcoholic beverages for 24 hours.  ACTIVITY:  You should plan to take it easy for the rest of today and you should NOT DRIVE or use heavy machinery until tomorrow (because of the sedation medicines used during the test).    FOLLOW UP: Our staff will call the number listed on your records the next business day following your procedure.  We will call around 7:15- 8:00 am to check on you and address any questions or concerns that you may have regarding the information given to you following your procedure. If we do not reach you, we will leave a message.     If any biopsies were taken you will be contacted by phone or by letter within the next 1-3 weeks.  Please call us at [phone] if you have not heard about the biopsies in 3 weeks.    SIGNATURES/CONFIDENTIALITY: You and/or your care partner have signed paperwork which will be entered into your electronic medical record.  These signatures attest to the fact that that the information above on your After Visit Summary has been reviewed and is understood.  Full responsibility of the confidentiality of this discharge information lies with you and/or your care-partner.

## 2022-12-11 NOTE — Progress Notes (Signed)
Called to room to assist during endoscopic procedure.  Patient ID and intended procedure confirmed with present staff. Received instructions for my participation in the procedure from the performing physician.  

## 2022-12-13 ENCOUNTER — Ambulatory Visit: Payer: BC Managed Care – PPO | Admitting: Family Medicine

## 2022-12-17 ENCOUNTER — Encounter: Payer: Self-pay | Admitting: Gastroenterology

## 2022-12-20 ENCOUNTER — Ambulatory Visit: Payer: BC Managed Care – PPO | Admitting: Family Medicine

## 2022-12-26 ENCOUNTER — Ambulatory Visit: Payer: BC Managed Care – PPO | Admitting: Family Medicine

## 2022-12-31 DIAGNOSIS — H40033 Anatomical narrow angle, bilateral: Secondary | ICD-10-CM | POA: Diagnosis not present

## 2022-12-31 DIAGNOSIS — H2513 Age-related nuclear cataract, bilateral: Secondary | ICD-10-CM | POA: Diagnosis not present

## 2023-01-02 ENCOUNTER — Ambulatory Visit (INDEPENDENT_AMBULATORY_CARE_PROVIDER_SITE_OTHER): Payer: BC Managed Care – PPO | Admitting: Family Medicine

## 2023-01-02 ENCOUNTER — Encounter: Payer: Self-pay | Admitting: Family Medicine

## 2023-01-02 VITALS — BP 102/80 | HR 71 | Temp 97.0°F | Ht 75.0 in | Wt 224.6 lb

## 2023-01-02 DIAGNOSIS — I10 Essential (primary) hypertension: Secondary | ICD-10-CM | POA: Diagnosis not present

## 2023-01-02 DIAGNOSIS — E785 Hyperlipidemia, unspecified: Secondary | ICD-10-CM

## 2023-01-02 DIAGNOSIS — R351 Nocturia: Secondary | ICD-10-CM

## 2023-01-02 DIAGNOSIS — F17200 Nicotine dependence, unspecified, uncomplicated: Secondary | ICD-10-CM | POA: Diagnosis not present

## 2023-01-02 DIAGNOSIS — R739 Hyperglycemia, unspecified: Secondary | ICD-10-CM | POA: Diagnosis not present

## 2023-01-02 DIAGNOSIS — F3342 Major depressive disorder, recurrent, in full remission: Secondary | ICD-10-CM | POA: Diagnosis not present

## 2023-01-02 DIAGNOSIS — F17210 Nicotine dependence, cigarettes, uncomplicated: Secondary | ICD-10-CM

## 2023-01-02 DIAGNOSIS — Z131 Encounter for screening for diabetes mellitus: Secondary | ICD-10-CM | POA: Diagnosis not present

## 2023-01-02 DIAGNOSIS — D6851 Activated protein C resistance: Secondary | ICD-10-CM

## 2023-01-02 DIAGNOSIS — G3184 Mild cognitive impairment, so stated: Secondary | ICD-10-CM | POA: Diagnosis not present

## 2023-01-02 DIAGNOSIS — N401 Enlarged prostate with lower urinary tract symptoms: Secondary | ICD-10-CM | POA: Diagnosis not present

## 2023-01-02 LAB — COMPREHENSIVE METABOLIC PANEL
ALT: 15 U/L (ref 0–53)
AST: 16 U/L (ref 0–37)
Albumin: 4.1 g/dL (ref 3.5–5.2)
Alkaline Phosphatase: 63 U/L (ref 39–117)
BUN: 19 mg/dL (ref 6–23)
CO2: 30 mEq/L (ref 19–32)
Calcium: 9.7 mg/dL (ref 8.4–10.5)
Chloride: 106 mEq/L (ref 96–112)
Creatinine, Ser: 0.93 mg/dL (ref 0.40–1.50)
GFR: 82.91 mL/min (ref >60.00)
Glucose, Bld: 95 mg/dL (ref 70–99)
Potassium: 4.9 mEq/L (ref 3.5–5.1)
Sodium: 143 mEq/L (ref 135–145)
Total Bilirubin: 0.5 mg/dL (ref 0.2–1.2)
Total Protein: 6.4 g/dL (ref 6.0–8.3)

## 2023-01-02 LAB — CBC WITH DIFFERENTIAL/PLATELET
Basophils Absolute: 0 10*3/uL (ref 0.0–0.1)
Basophils Relative: 0.5 % (ref 0.0–3.0)
Eosinophils Absolute: 0.1 10*3/uL (ref 0.0–0.7)
Eosinophils Relative: 1.4 % (ref 0.0–5.0)
HCT: 45.8 % (ref 39.0–52.0)
Hemoglobin: 15.3 g/dL (ref 13.0–17.0)
Lymphocytes Relative: 22.1 % (ref 12.0–46.0)
Lymphs Abs: 2.2 10*3/uL (ref 0.7–4.0)
MCHC: 33.5 g/dL (ref 30.0–36.0)
MCV: 96.5 fl (ref 78.0–100.0)
Monocytes Absolute: 1 10*3/uL (ref 0.1–1.0)
Monocytes Relative: 10.5 % (ref 3.0–12.0)
Neutro Abs: 6.6 10*3/uL (ref 1.4–7.7)
Neutrophils Relative %: 65.5 % (ref 43.0–77.0)
Platelets: 235 10*3/uL (ref 150.0–400.0)
RBC: 4.74 Mil/uL (ref 4.22–5.81)
RDW: 13.5 % (ref 11.5–15.5)
WBC: 10 10*3/uL (ref 4.0–10.5)

## 2023-01-02 LAB — URINALYSIS, ROUTINE W REFLEX MICROSCOPIC
Bilirubin Urine: NEGATIVE
Hgb urine dipstick: NEGATIVE
Ketones, ur: NEGATIVE
Leukocytes,Ua: NEGATIVE
Nitrite: NEGATIVE
Specific Gravity, Urine: 1.025 (ref 1.000–1.030)
Total Protein, Urine: NEGATIVE
Urine Glucose: NEGATIVE
Urobilinogen, UA: 0.2 (ref 0.0–1.0)
WBC, UA: NONE SEEN (ref 0–?)
pH: 6 (ref 5.0–8.0)

## 2023-01-02 LAB — HEMOGLOBIN A1C: Hgb A1c MFr Bld: 6 % (ref 4.6–6.5)

## 2023-01-02 LAB — PSA: PSA: 0.6 ng/mL (ref 0.10–4.00)

## 2023-01-02 NOTE — Addendum Note (Signed)
Addended by: Marin Olp on: 01/02/2023 10:04 PM   Modules accepted: Level of Service

## 2023-01-02 NOTE — Patient Instructions (Addendum)
Let us know when you get your TDAP at the pharmacy.  Please stop by lab before you go If you have mychart- we will send your results within 3 business days of [place] receiving them.  If you do not have mychart- we will call you about results within 5 business days of [place] receiving them.  *please also note that you will see labs on mychart as soon as they post. I will later go in and write notes on them- will say "notes from Dr. Yong Channel"   We will either call you (or see alternate below) within two weeks about your referral to pulmonology for lung cancer screening . Our referral specialist will sometimes also send you a mychart link once referral is approved and then you will call the # listed on there (let us know if you do not see this within 2 weeks or have not received call)   Recommended follow up: Return in about 6 months (around 07/05/2023) for followup or sooner if needed.Schedule b4 you leave.

## 2023-01-02 NOTE — Progress Notes (Addendum)
Phone: [phone]   Subjective:  Patient presents today for their follow up  Chief complaint-noted.   See problem oriented charting- ROS- full  review of systems was completed and negative  except for: some slouching, some muscular discomfort, some right knee pain, memory loss  The following were reviewed and entered/updated in epic: Past Medical History:  Diagnosis Date   Allergic rhinitis 07/31/2007   Loratadine at times- but mild mental fog on this    BPH associated with nocturia 03/10/2018   Clotting disorder    Current smoker 07/26/2014   Has quit several several times and restarted. Most recently quit 11/06/14. Need to keep a close eye.    Essential hypertension, benign 07/26/2014   Benazepril 20mg     Factor V Leiden 07/31/2007   Xarelto 20mg  daily. 2 DVT 2001, PE 2011.     History of colonic polyps 07/31/2007   02/2015 adenoma. 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 Patient states thought was external.      Left inguinal hernia 12/05/2016   Declines surgery referral- minimally bothering him. Can call in if he changes his mind   Major depressive disorder in full remission 03/24/2009   Wellbutrin xl 300mg -->150mg --> off 03/2020  pristiq 50mg .   In past-->remeron 15mg  mainly takes prn for sleep zoloft --> pristiq per sister who is psychiatrist apparetnly   MCI (mild cognitive impairment) 06/15/2015   Osteoarthritis    Right rotator cuff tear 12/07/2010   S/p surgery.     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 COmpression stocking on left. L >R swelling-history of 1st DVT in L.      Patient Active Problem List   Diagnosis Date Noted   MCI (mild cognitive impairment) 06/15/2015    Priority:  High   Current smoker 07/26/2014    Priority: High   Factor V Leiden ([place]) 07/31/2007    Priority: High   History of venous thrombosis and embolism 07/31/2007    Priority: High   BPH associated with nocturia 03/10/2018    Priority: Medium    Hyperglycemia 12/05/2016    Priority: Medium    Essential hypertension, benign 07/26/2014    Priority: Medium    Major depressive disorder in full remission 03/24/2009    Priority: Medium    Hyperlipidemia 07/31/2007    Priority: Medium    Senile purpura 09/22/2020    Priority: Low   Left inguinal hernia 12/05/2016    Priority: Low   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    Priority: Low   Osteoarthritis 07/15/2008    Priority: Low   Venous (peripheral) insufficiency 12/11/2007    Priority: Low   Allergic rhinitis 07/31/2007    Priority: Low   History of colonic polyps 07/31/2007    Priority: Low   Chronic anticoagulation 10/18/2022   Family history of colon cancer in father 10/18/2022   Past Surgical History:  Procedure Laterality Date   COLONOSCOPY     right rotator cuff repair     SALIVARY STONE REMOVAL      Family History  Problem Relation Age of Onset   Memory loss Mother    Colon polyps Father    Colon cancer Father 72   Factor V Leiden  deficiency Son    Esophageal cancer Neg Hx    Rectal cancer Neg Hx    Stomach cancer Neg Hx     Medications- reviewed and updated Current Outpatient Medications  Medication Sig Dispense Refill   donepezil (ARICEPT) 10 MG tablet Take 10 mg by mouth at bedtime.     Loratadine 10 MG CAPS Take by mouth daily.     memantine (NAMENDA) 5 MG tablet Take 1 tablet (5 mg at night) for 2 weeks, then increase to 1 tablet (5 mg) twice a day 60 tablet 11   Multiple Vitamin (MULTIVITAMIN) capsule Take by mouth.     rosuvastatin (CRESTOR) 40 MG tablet Take 1 tablet by mouth once daily 90 tablet 0   XARELTO 20 MG TABS tablet Take 1 tablet by mouth once daily 90 tablet 0   desvenlafaxine  (PRISTIQ) 25 MG 24 hr tablet Take 1 tablet (25 mg total) by mouth daily. 90 tablet 3   No current facility-administered medications for this visit.    Allergies-reviewed and updated Allergies  Allergen Reactions   Yellow Jacket Venom [Bee Venom] Shortness Of Breath    Had swelling localized, chest tightness, and shortness of breath    Social History   Social History Narrative   Married (wife patient of Dr. Yong Channel). Son and daughter. 1 granddaughter.       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 Oropharynx normal Neck: no thyromegaly CV: RRR no murmurs rubs or gallops Lungs: CTAB no crackles, wheeze, rhonchi Abdomen: soft/nontender/nondistended/normal bowel sounds. No rebound or guarding.  Ext: no edema Skin: warm, dry Neuro: grossly normal, moves all extremities, PERRLA, looks to wife with some answers    Assessment and Plan  72 y.o. male presenting for follow up- but reviewed CPE items as wife present today Health Maintenance counseling: 1. Anticipatory guidance: Patient counseled regarding regular dental exams -q6 months, eye exams -advised yearly,  avoiding smoking and second hand smoke- see below , limiting alcohol to 2 beverages per day - very very rare social, no illicit drugs .   2. Risk factor reduction:  Advised patient of need for regular exercise and diet rich and fruits and vegetables to reduce risk of heart attack and stroke.  Exercise- somewhat active with work but not  active outside of that- encouraged structured exercise Diet/weight management-weight is largely stable- mild weight loss would be helpful.  Wt Readings from Last 3 Encounters:  01/02/23 224 lb 9.6 oz (101.9 kg)  12/11/22 216 lb 3.2 oz (98.1 kg)  12/06/22 225 lb (102.1 kg)  3. Immunizations/screenings/ancillary studies- up to date   Immunization History  Administered Date(s) Administered   Fluad Quad(high Dose 65+) 07/22/2020, 07/23/2021, 08/01/2021   Influenza Split 07/26/2011, 08/07/2012   Influenza Whole 07/31/2007, 07/15/2008, 07/07/2009, 06/29/2010   Influenza, High Dose Seasonal PF 09/08/2017, 07/15/2018, 07/10/2019   Influenza,inj,Quad PF,6+  Mos 08/20/2013, 07/26/2014, 06/15/2015, 07/05/2016   Influenza-Unspecified 07/10/2019   Moderna Covid-19 Vaccine Bivalent Booster 46yrs & up 07/27/2022   PFIZER(Purple Top)SARS-COV-2 Vaccination 11/03/2019, 11/24/2019, 07/22/2020, 03/03/2021   PNEUMOCOCCAL CONJUGATE-20 03/03/2021   Pneumococcal Conjugate-13 06/05/2017   Pneumococcal Polysaccharide-23 06/22/2010, 07/15/2018   Td 10/14/2001   Tdap 01/03/2012   Zoster Recombinat (Shingrix) 07/10/2019, 09/29/2019  4. Prostate cancer screening-  low risk prio psa trend- offered to continue  Lab Results  Component Value Date   PSA 0.60 01/02/2023   PSA 0.46 06/27/2022   PSA 0.60 04/09/2021   5. Colon cancer screening - Colonoscopy Dr. Fuller Plan feb 2024-3-year repeat due to precancerous polyps 6. Skin cancer screening- declines derm for now- prior Dr. Denna Haggard. advised regular sunscreen use. Denies worrisome, changing, or new skin lesions.  7. Smoking associated screening (lung cancer screening, AAA screen 65-75, UA)- current smoker-almost PPD- advised full cessation. AAA screen sept 2018. Get UA. Declines lung cancer screening usually- wife encouraged him to complete- he agrees 8. STD screening -  only active with wife   Status of chronic or acute concerns   #Mild cognitive impairment-was started  on Aricept 10 mg  and namenda 5 mg BID by Dr. Delice Lesch and Sherrilyn Rist, PA. He is doing well with this medicine- no side effects - continue current medications and follow up with neurology    #Factor V leiden- ongoing long term xarelto- no issues while on medication  #hypertension S: medication: off benazepril lately- blood pressure has trended down BP Readings from Last 3 Encounters:  01/02/23 102/80  12/11/22 98/64  12/06/22 94/61  A/P: doing well off medicine- continue to monitor   #hyperlipidemia S: Medication: rosuvastatin 40 mg Lab Results  Component Value Date   CHOL 157 06/14/2022   HDL 46.30 06/14/2022   LDLCALC 84 06/14/2022   LDLDIRECT 86.0 09/13/2019   TRIG 134.0 06/14/2022   CHOLHDL 3 06/14/2022   A/P: reasonable control- continue current medications - prediabetes noted and discussed risk- too soon for full repeat  # Hyperglycemia/insulin resistance/prediabetes S:  Medication: none Lab Results  Component Value Date   HGBA1C 6.0 01/02/2023   HGBA1C 5.9 06/14/2022   HGBA1C 6.0 12/05/2021   A/P: hopefully stable- update a1c today. Continue without meds for now  # Depression S: Medication:pristiq 25 mg down from 50mg - no increased energy with reduction but no worsening depression    01/02/2023    8:13 AM 11/21/2022    8:08 AM 09/13/2022    4:05 PM  Depression screen PHQ 2/9  Decreased Interest 0 0 0  Down, Depressed, Hopeless 0 0 0  PHQ - 2 Score 0 0 0  Altered sleeping 0  0  Tired, decreased energy 0  0  Change in appetite 0  0  Feeling bad or failure about yourself  0  0  Trouble concentrating 0  0  Moving slowly or fidgety/restless 0  0  Suicidal thoughts 0  0  PHQ-9 Score 0  0  Difficult doing work/chores Not difficult at all  Not difficult at all  A/P: full remisison- continue current medications  Recommended follow up: Return in about 6 months (around 07/05/2023) for followup or sooner if needed.Schedule b4 you leave. Future Appointments  Date Time  Provider [place]  06/13/2023  9:00 AM Elease Hashimoto [facility] None  09/19/2023  8:30 AM Hazle Coca, PhD [facility] None  09/19/2023  9:30 AM [facility] TE[facility] None  09/26/2023 10:00 AM Hazle Coca, PhD [facility] None  12/11/2023  7:45 AM [facility] ANNUAL WELLNESS VISIT 1 [facility] PEC   Lab/Order associations:NOT fasting   ICD-10-CM   1. Current smoker  F17.200 Urinalysis, Routine w reflex microscopic    Ambulatory Referral for Lung Cancer Scre    2. Factor V Leiden ([place])  D68.51     3. MCI (mild cognitive impairment)  G31.84     4. Essential hypertension, benign  I10     5. Hyperglycemia  R73.9 HgB A1c    6. Hyperlipidemia, unspecified hyperlipidemia type  E78.5 CBC with Differential/Platelet    Comprehensive metabolic panel    7. Recurrent major depressive disorder, in full remission ([place])  F33.42     8. Screening for diabetes mellitus  Z13.1 HgB A1c    9. BPH associated with nocturia  N40.1 PSA   R35.1       No orders of the defined types were placed in this encounter.   Return precautions advised.  [name], MD

## 2023-01-13 ENCOUNTER — Encounter: Payer: Self-pay | Admitting: Family Medicine

## 2023-01-14 NOTE — Telephone Encounter (Signed)
I have sent Lattie Haw a message to check on this.

## 2023-01-17 ENCOUNTER — Other Ambulatory Visit: Payer: Self-pay

## 2023-01-17 DIAGNOSIS — Z87891 Personal history of nicotine dependence: Secondary | ICD-10-CM

## 2023-01-17 DIAGNOSIS — F17210 Nicotine dependence, cigarettes, uncomplicated: Secondary | ICD-10-CM

## 2023-02-04 ENCOUNTER — Encounter: Payer: Self-pay | Admitting: Physician Assistant

## 2023-02-04 ENCOUNTER — Ambulatory Visit (INDEPENDENT_AMBULATORY_CARE_PROVIDER_SITE_OTHER): Payer: BC Managed Care – PPO | Admitting: Physician Assistant

## 2023-02-04 DIAGNOSIS — F17210 Nicotine dependence, cigarettes, uncomplicated: Secondary | ICD-10-CM | POA: Diagnosis not present

## 2023-02-04 NOTE — Progress Notes (Signed)
Virtual Visit via Telephone Note  I connected with [name] on 02/04/23 at 11:30 AM EDT by telephone and verified that I am speaking with the correct person using two identifiers.  Location: Patient: home Provider: working virtually from home   I discussed the limitations, risks, security and privacy concerns of performing an evaluation and management service by telephone and the availability of in person appointments. I also discussed with the patient that there may be a patient responsible charge related to this service. The patient expressed understanding and agreed to proceed.       Shared Decision Making Visit Lung Cancer Screening Program [phone])   Eligibility: Age 47 y.o. Pack Years Smoking History Calculation 38 (# packs/per year x # years smoked) Recent History of coughing up blood  no Unexplained weight loss? no ( >Than 15 pounds within the last 6 months ) Prior History Lung / other cancer no (Diagnosis within the last 5 years already requiring surveillance chest CT Scans). Smoking Status Current Smoker   Visit Components: Discussion included one or more decision making aids. yes Discussion included risk/benefits of screening. yes Discussion included potential follow up diagnostic testing for abnormal scans. yes Discussion included meaning and risk of over diagnosis. yes Discussion included meaning and risk of False Positives. yes Discussion included meaning of total radiation exposure. yes  Counseling Included: Importance of adherence to annual lung cancer LDCT screening. yes Impact of comorbidities on ability to participate in the program. yes Ability and willingness to under diagnostic treatment. yes  Smoking Cessation Counseling: Current Smokers:  Discussed importance of smoking cessation. yes Information about tobacco cessation classes and interventions provided to patient. yes Patient provided with "ticket" for LDCT Scan. N/a Symptomatic  Patient. no Diagnosis Code: Tobacco Use Z72.0 Asymptomatic Patient yes  Counseling (Intermediate counseling: > three minutes counseling) U0454 Former Smokers:  Information about tobacco cessation classes and interventions provided to patient. Yes Written Order for Lung Cancer Screening with LDCT placed in Epic. Yes (CT Chest Lung Cancer Screening Low Dose W/O CM) [id number] Z12.2-Screening of respiratory organs Z87.891-Personal history of nicotine dependence    I have spent 25 minutes of face to face/ virtual visit   time with the patient discussing the risks and benefits of lung cancer screening. We viewed / discussed a power point together that explained in detail the above noted topics. We paused at intervals to allow for questions to be asked and answered to ensure understanding.We discussed that the single most powerful action that he can take to decrease his risk of developing lung cancer is to quit smoking. We discussed whether or not he is ready to commit to setting a quit date. We discussed options for tools to aid in quitting smoking including nicotine replacement therapy, non-nicotine medications, support groups, Quit Smart classes, and behavior modification. We discussed that often times setting smaller, more achievable goals, such as eliminating 1 cigarette a day for a week and then 2 cigarettes a day for a week can be helpful in slowly decreasing the number of cigarettes smoked. This allows for a sense of accomplishment as well as providing a clinical benefit. I provided  him  with smoking cessation  information  with contact information for community resources, classes, free nicotine replacement therapy, and access to mobile apps, text messaging, and on-line smoking cessation help. I have also provided  him  the office contact information in the event he needs to contact me, or the screening staff. We discussed the time and location  of the scan, and that either Abigail Miyamoto RN, Karlton Lemon, RN  or I will call / send a letter with the results within 24-72 hours of receiving them. The patient verbalized understanding of all of  the above and had no further questions upon leaving the office. They have my contact information in the event they have any further questions.  I spent two minutes counseling on smoking cessation and the health risks of continued tobacco abuse.  I explained to the patient that there has been a high incidence of coronary artery disease noted on these exams. I explained that this is a non-gated exam therefore degree or severity cannot be determined. This patient is on statin therapy. I have asked the patient to follow-up with their PCP regarding any incidental finding of coronary artery disease and management with diet or medication as their PCP  feels is clinically indicated. The patient verbalized understanding of the above and had no further questions upon completion of the visit.     Darcella Gasman Cato Liburd, PA-C

## 2023-02-04 NOTE — Patient Instructions (Signed)
Thank you for participating in the [facility]. It was our pleasure to meet you today. We will call you with the results of your scan within the next few days. Your scan will be assigned a Lung RADS category score by the physicians reading the scans.  This Lung RADS score determines follow up scanning.  See below for description of categories, and follow up screening recommendations. We will be in touch to schedule your follow up screening annually or based on recommendations of our providers. We will fax a copy of your scan results to your Primary Care Physician, or the physician who referred you to the program, to ensure they have the results. Please call the office if you have any questions or concerns regarding your scanning experience or results.  Our office number is [phone]. Please speak with Denise Phelps, RN. , or  Denise Buckner RN, They are  our Lung Cancer Screening RN.'s If They are unavailable when you call, Please leave a message on the voice mail. We will return your call at our earliest convenience.This voice mail is monitored several times a day.  Remember, if your scan is normal, we will scan you annually as long as you continue to meet the criteria for the program. (Age 50-80, Current smoker or smoker who has quit within the last 15 years). If you are a smoker, remember, quitting is the single most powerful action that you can take to decrease your risk of lung cancer and other pulmonary, breathing related problems. We know quitting is hard, and we are here to help.  Please let us know if there is anything we can do to help you meet your goal of quitting. If you are a former smoker, congratulations. We are proud of you! Remain smoke free! Remember you can refer friends or family members through the number above.  We will screen them to make sure they meet criteria for the program. Thank you for helping us take better care of you by  participating in Lung Screening.  You can receive free nicotine replacement therapy ( patches, gum or mints) by calling 1-800-QUIT NOW. Please call so we can get you on the path to becoming  a non-smoker. I know it is hard, but you can do this!  Lung RADS Categories:  Lung RADS 1: no nodules or definitely non-concerning nodules.  Recommendation is for a repeat annual scan in 12 months.  Lung RADS 2:  nodules that are non-concerning in appearance and behavior with a very low likelihood of becoming an active cancer. Recommendation is for a repeat annual scan in 12 months.  Lung RADS 3: nodules that are probably non-concerning , includes nodules with a low likelihood of becoming an active cancer.  Recommendation is for a 6-month repeat screening scan. Often noted after an upper respiratory illness. We will be in touch to make sure you have no questions, and to schedule your 6-month scan.  Lung RADS 4 A: nodules with concerning findings, recommendation is most often for a follow up scan in 3 months or additional testing based on our provider's assessment of the scan. We will be in touch to make sure you have no questions and to schedule the recommended 3 month follow up scan.  Lung RADS 4 B:  indicates findings that are concerning. We will be in touch with you to schedule additional diagnostic testing based on our provider's  assessment of the scan.  Other options for assistance in smoking cessation (   As covered by your insurance benefits)  Hypnosis for smoking cessation  [facility] [phone]  Acupuncture for smoking cessation  [facility] [phone]   

## 2023-02-18 ENCOUNTER — Ambulatory Visit
Admission: RE | Admit: 2023-02-18 | Discharge: 2023-02-18 | Disposition: A | Discharge status: Home / Self Care | Payer: BC Managed Care – PPO | Source: Ambulatory Visit | Attending: Acute Care | Admitting: Acute Care

## 2023-02-18 DIAGNOSIS — Z87891 Personal history of nicotine dependence: Secondary | ICD-10-CM

## 2023-02-18 DIAGNOSIS — F17210 Nicotine dependence, cigarettes, uncomplicated: Secondary | ICD-10-CM

## 2023-02-18 DIAGNOSIS — I700 Atherosclerosis of aorta: Secondary | ICD-10-CM | POA: Diagnosis not present

## 2023-02-18 DIAGNOSIS — Z122 Encounter for screening for malignant neoplasm of respiratory organs: Secondary | ICD-10-CM | POA: Diagnosis not present

## 2023-02-24 ENCOUNTER — Other Ambulatory Visit: Payer: Self-pay

## 2023-02-24 DIAGNOSIS — Z87891 Personal history of nicotine dependence: Secondary | ICD-10-CM

## 2023-02-24 DIAGNOSIS — F17210 Nicotine dependence, cigarettes, uncomplicated: Secondary | ICD-10-CM

## 2023-03-02 ENCOUNTER — Other Ambulatory Visit: Payer: Self-pay | Admitting: Family Medicine

## 2023-03-09 ENCOUNTER — Other Ambulatory Visit: Payer: Self-pay | Admitting: Family Medicine

## 2023-05-03 ENCOUNTER — Other Ambulatory Visit: Payer: Self-pay

## 2023-05-03 ENCOUNTER — Emergency Department (HOSPITAL_COMMUNITY)
Admission: EM | Admit: 2023-05-03 | Discharge: 2023-05-03 | Disposition: A | Discharge status: Home / Self Care | Payer: BC Managed Care – PPO | Attending: Emergency Medicine | Admitting: Emergency Medicine

## 2023-05-03 ENCOUNTER — Encounter (HOSPITAL_COMMUNITY): Payer: Self-pay | Admitting: Emergency Medicine

## 2023-05-03 DIAGNOSIS — T63441A Toxic effect of venom of bees, accidental (unintentional), initial encounter: Secondary | ICD-10-CM | POA: Insufficient documentation

## 2023-05-03 DIAGNOSIS — Z7901 Long term (current) use of anticoagulants: Secondary | ICD-10-CM | POA: Diagnosis not present

## 2023-05-03 MED ORDER — EPINEPHRINE 0.3 MG/0.3ML IJ SOAJ: 0.3000 mg | INTRAMUSCULAR | 0 refills | Status: AC | PRN

## 2023-05-03 MED ORDER — PREDNISONE 50 MG PO TABS
60.0000 mg | ORAL_TABLET | Freq: Once | ORAL | Status: AC
  Administered 2023-05-03: 60 mg via ORAL
  Filled 2023-05-03: qty 1

## 2023-05-03 MED ORDER — TRIAMCINOLONE ACETONIDE 0.1 % EX CREA: 1.0000 | TOPICAL_CREAM | Freq: Two times a day (BID) | CUTANEOUS | 0 refills | Status: AC

## 2023-05-03 MED ORDER — FAMOTIDINE 20 MG PO TABS
20.0000 mg | ORAL_TABLET | Freq: Once | ORAL | Status: AC
  Administered 2023-05-03: 20 mg via ORAL
  Filled 2023-05-03: qty 1

## 2023-05-03 NOTE — Discharge Instructions (Signed)
You have been seen today for your complaint of bee sting, left hand swelling. Your discharge medications include Kenalog.  This is a topical steroid.  Apply to the hand for itching and swelling twice daily until your symptoms have resolved for at least 2 days. I have also sent your refill on your EpiPens.  If you need to use an EpiPen, it is important that you present to the emergency department afterwards. Follow up with: Your PCP in 3 to 5 days Please seek immediate medical care if you develop any of the following symptoms: Chest tightness or pain. Wheezing, or trouble swallowing or breathing. Light-headedness, dizziness, or fainting. Itchy, raised, red patches on the skin beyond the sting site. Abdominal cramping, nausea, vomiting, or diarrhea. Trouble swallowing or a swollen tongue, throat, or lips. At this time there does not appear to be the presence of an emergent medical condition, however there is always the potential for conditions to change. Please read and follow the below instructions.  Do not take your medicine if  develop an itchy rash, swelling in your mouth or lips, or difficulty breathing; call 911 and seek immediate emergency medical attention if this occurs.  You may review your lab tests and imaging results in their entirety on your MyChart account.  Please discuss all results of fully with your primary care provider and other specialist at your follow-up visit.  Note: Portions of this text may have been transcribed using voice recognition software. Every effort was made to ensure accuracy; however, inadvertent computerized transcription errors may still be present.

## 2023-05-03 NOTE — ED Notes (Signed)
Medicated per MAR Attached to partial monitor

## 2023-05-03 NOTE — ED Notes (Signed)
Introduced self to pt Pt stated that yesterday around 3-4pm he was stung by an "in the ground bee" Pt stated that his LEFT hand and LEFT leg was stung.  Noted LEFT hand swelling. Good PMS, pt denies numbness and tingling.  No trauma noted to LEFT leg.  Leg has discoloration, pt stated that is normal with prior hx of DVT and PE. Pt stated he is on blood thinners,  Pt denies SOB and CP. Had prior wasp allergy in the 70's. Did not use epi pen. Stated that they are old and expired.   Call bell next to pt Waiting on further orders.

## 2023-05-03 NOTE — ED Triage Notes (Signed)
Pt reports being stung by yellow jacket on the left hand and left leg yesterday. Pt denies any lip, tongue, or facial swelling.

## 2023-05-03 NOTE — ED Provider Notes (Signed)
[place] EMERGENCY DEPARTMENT AT [facility] Provider Note   CSN: [id number] Arrival date & time: 05/03/23  0347     History  Chief Complaint  Patient presents with   Insect Bite    Rodney Scott is a 72 y.o. male.  With a history of factor V Leiden on Xarelto, chronic cognitive impairment who presents to the ED for evaluation of a bee sting.  He states that he was moving a tree branch yesterday at 6 PM when he was stung on the left hand and left ankle.  He immediately swatted the bees away.  He states he noticed mild left hand swelling and itching that night.  States the swelling has gotten slightly worse today.  He used Benadryl with mild improvement prior to arrival.  He denies any fevers or chills.  No shortness of breath, cough, nausea, vomiting, difficulty breathing, facial swelling, lip swelling.  He has EpiPen's at home but states he has never needed to use one at home.  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  HPI     Home Medications Prior to Admission medications   Medication Sig Start Date End Date Taking? Authorizing Provider  EPINEPHrine (EPIPEN 2-PAK) 0.3 mg/0.3 mL IJ SOAJ injection Inject 0.3 mg into the muscle as needed for anaphylaxis. 05/03/23  Yes Andrus Sharp, Edsel Petrin, PA-C  triamcinolone cream (KENALOG) 0.1 % Apply 1 Application topically 2 (two) times daily. 05/03/23  Yes Mohan Erven, Edsel Petrin, PA-C  desvenlafaxine (PRISTIQ) 25 MG 24 hr tablet Take 1 tablet (25 mg total) by mouth daily. 09/26/22   Shelva Majestic, MD  donepezil (ARICEPT) 10 MG tablet Take 10 mg by mouth at bedtime.    [provider]  Loratadine 10 MG CAPS Take by mouth daily.    [provider]  memantine (NAMENDA) 5 MG tablet Take 1 tablet (5 mg at night) for 2 weeks, then increase to 1 tablet (5 mg) twice a day 12/06/22   Marcos Eke, PA-C  Multiple Vitamin (MULTIVITAMIN) capsule Take by mouth.    [provider]   rosuvastatin (CRESTOR) 40 MG tablet Take 1 tablet by mouth once daily 03/03/23   Shelva Majestic, MD  XARELTO 20 MG TABS tablet Take 1 tablet by mouth once daily 03/11/23   Shelva Majestic, MD      Allergies    Yellow jacket venom [bee venom]    Review of Systems   Review of Systems  Musculoskeletal:  Positive for joint swelling.  All other systems reviewed and are negative.   Physical Exam Updated Vital Signs BP (!) 138/90 (BP Location: Right Arm)   Pulse (!) 50   Temp 97.6 F (36.4 C) (Oral)   Resp 18   Ht 6\' 3"  (1.905 m)   Wt 99.8 kg   SpO2 96%   BMI 27.50 kg/m  Physical Exam Vitals and nursing note reviewed.  Constitutional:      General: He is not in acute distress.    Appearance: He is well-developed.  HENT:     Head: Normocephalic and atraumatic.     Mouth/Throat:     Mouth: Mucous membranes are moist.     Pharynx: Oropharynx is clear.     Comments: No facial swelling.  No glossitis, uvulitis.  Widely patent airway. Eyes:     Conjunctiva/sclera: Conjunctivae normal.  Cardiovascular:     Rate and Rhythm: Normal rate and regular rhythm.     Heart  sounds: No murmur heard. Pulmonary:     Effort: Pulmonary effort is normal. No respiratory distress.     Breath sounds: Normal breath sounds. No stridor. No wheezing, rhonchi or rales.  Abdominal:     Palpations: Abdomen is soft.     Tenderness: There is no abdominal tenderness.  Musculoskeletal:     Cervical back: Neck supple.     Comments: Moderate swelling of the left dorsum of the hand.  Tiny puncture wound appreciated overlying the proximal phalanx of the thumb.  No overlying erythema.  Capillary refill normal.  Radial pulses 2+ bilaterally.  Grip strength 5 out of 5.  Sensation intact in all digits.  Full AROM of all digits.  Skin:    General: Skin is warm and dry.     Capillary Refill: Capillary refill takes less than 2 seconds.  Neurological:     Mental Status: He is alert.  Psychiatric:        Mood  and Affect: Mood normal.     ED Results / Procedures / Treatments   Labs (all labs ordered are listed, but only abnormal results are displayed) Labs Reviewed - No data to display  EKG None  Radiology No results found.  Procedures Procedures    Medications Ordered in ED Medications  predniSONE (DELTASONE) tablet 60 mg (60 mg Oral Given 05/03/23 1241)  famotidine (PEPCID) tablet 20 mg (20 mg Oral Given 05/03/23 1241)    ED Course/ Medical Decision Making/ A&P                             Medical Decision Making  This patient presents to the ED for concern of bee sting, localized swelling, this involves an extensive number of treatment options, and is a complaint that carries with it a high risk of complications and morbidity.  The differential diagnosis includes histaminergic reaction, anaphylaxis, cellulitis  My initial workup includes Pepcid, prednisone  Additional history obtained from: Nursing notes from this visit.  Afebrile, hemodynamically stable.  72 year old male presenting to the ED for evaluation of a bee sting to the left hand and left ankle.  Left hand does appear to be mildly swollen.  No overlying erythema to suggest cellulitis.  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  He states his EpiPen's are expired.  He has no signs or symptoms of anaphylaxis today.  Likely has a localized reaction to the bee sting.  He reported some improvement with Benadryl just prior to arrival.  Was given Pepcid and prednisone here today.  He will be given a prescription for topical steroid as well as a refill of his EpiPens.  He was encouraged to follow-up with his primary care provider within the next 3 to 5 days.  He was given strict return precautions and voices understanding.  Stable at discharge.  At this time there does not appear to be any evidence of an acute emergency medical condition and the patient appears stable for discharge with appropriate  outpatient follow up. Diagnosis was discussed with patient who verbalizes understanding of care plan and is agreeable to discharge. I have discussed return precautions with patient who verbalizes understanding. Patient encouraged to follow-up with their PCP within 5 days. All questions answered.  Patient's case discussed with Dr. Durwin Nora who agrees with plan to discharge with follow-up.   Note: Portions of this report may have been transcribed using voice recognition software. Every  effort was made to ensure accuracy; however, inadvertent computerized transcription errors may still be present.         Final Clinical Impression(s) / ED Diagnoses Final diagnoses:  Bee sting reaction, accidental or unintentional, initial encounter    Rx / DC Orders ED Discharge Orders          Ordered    EPINEPHrine (EPIPEN 2-PAK) 0.3 mg/0.3 mL IJ SOAJ injection  As needed        05/03/23 1341    triamcinolone cream (KENALOG) 0.1 %  2 times daily        05/03/23 1341              Michelle Piper, PA-C 05/03/23 1341    Gloris Manchester, MD 05/05/23 [phone]

## 2023-05-07 ENCOUNTER — Telehealth: Payer: Self-pay | Admitting: *Deleted

## 2023-05-07 NOTE — Telephone Encounter (Signed)
Transition Care Management Unsuccessful Follow-up Telephone Call  Date of discharge and from where:  [facility]03/20[facility]  Attempts:  1st Attempt  Reason for unsuccessful TCM follow-up call:  No answer/busy

## 2023-05-08 ENCOUNTER — Telehealth: Payer: Self-pay | Admitting: *Deleted

## 2023-05-08 NOTE — Telephone Encounter (Signed)
Transition Care Management Unsuccessful Follow-up Telephone Call  Date of discharge and from where:  [facility]  Attempts:  2nd Attempt  Reason for unsuccessful TCM follow-up call:  Left voice message

## 2023-05-13 ENCOUNTER — Ambulatory Visit: Payer: BC Managed Care – PPO | Admitting: Family Medicine

## 2023-05-13 ENCOUNTER — Encounter: Payer: Self-pay | Admitting: Family Medicine

## 2023-05-13 VITALS — BP 110/70 | HR 76 | Temp 97.2°F | Ht 75.0 in | Wt 224.2 lb

## 2023-05-13 DIAGNOSIS — F331 Major depressive disorder, recurrent, moderate: Secondary | ICD-10-CM | POA: Insufficient documentation

## 2023-05-13 DIAGNOSIS — G3184 Mild cognitive impairment, so stated: Secondary | ICD-10-CM

## 2023-05-13 DIAGNOSIS — F17200 Nicotine dependence, unspecified, uncomplicated: Secondary | ICD-10-CM

## 2023-05-13 DIAGNOSIS — I10 Essential (primary) hypertension: Secondary | ICD-10-CM | POA: Diagnosis not present

## 2023-05-13 DIAGNOSIS — E785 Hyperlipidemia, unspecified: Secondary | ICD-10-CM | POA: Diagnosis not present

## 2023-05-13 MED ORDER — DESVENLAFAXINE SUCCINATE ER 25 MG PO TB24: 25.0000 mg | ORAL_TABLET | Freq: Every day | ORAL | 3 refills | Status: DC

## 2023-05-13 NOTE — Progress Notes (Signed)
Phone [phone] In person visit   Subjective:   Rodney Scott is a 72 y.o. year old very pleasant male patient who presents for/with See problem oriented charting Chief Complaint  Patient presents with   Medical Management of Chronic Issues   Hyperlipidemia   Hypertension   Past Medical History-  Patient Active Problem List   Diagnosis Date Noted   MCI (mild cognitive impairment) 06/15/2015    Priority: High   Current smoker 07/26/2014    Priority: High   Factor V Leiden (HCC) 07/31/2007    Priority: High   History of venous thrombosis and embolism 07/31/2007    Priority: High   BPH associated with nocturia 03/10/2018    Priority: Medium    Hyperglycemia 12/05/2016    Priority: Medium    Essential hypertension, benign 07/26/2014    Priority: Medium    Major depressive disorder in full remission 03/24/2009    Priority: Medium    Hyperlipidemia 07/31/2007    Priority: Medium    Senile purpura 09/22/2020    Priority: Low   Left inguinal hernia 12/05/2016    Priority: Low   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     EPINEPHrine (EPIPEN 2-PAK) 0.3 mg/0.3 mL IJ SOAJ injection Inject 0.3 mg into the muscle as needed for anaphylaxis. 1 each 0   memantine (NAMENDA) 5 MG tablet Take 1 tablet (5 mg at night) for 2 weeks, then increase to 1 tablet (5 mg) twice a day 60 tablet 11   Multiple Vitamin (MULTIVITAMIN) capsule Take by mouth.     rosuvastatin (CRESTOR) 40 MG tablet Take 1  tablet by mouth once daily 90 tablet 0   triamcinolone cream (KENALOG) 0.1 % Apply 1 Application topically 2 (two) times daily. 30 g 0   XARELTO 20 MG TABS tablet Take 1 tablet by mouth once daily 90 tablet 0   desvenlafaxine (PRISTIQ) 25 MG 24 hr tablet Take 1 tablet (25 mg total) by mouth daily. 90 tablet 3   No current facility-administered medications for this visit.     Objective:  BP 110/70   Pulse 76   Temp (!) 97.2 F (36.2 C)   Ht 6\' 3"  (1.905 m)   Wt 224 lb 3.2 oz (101.7 kg)   SpO2 94%   BMI 28.02 kg/m  Gen: NAD, resting comfortably CV: RRR no murmurs rubs or gallops Lungs: CTAB no crackles, wheeze, rhonchi Ext: trace to 1+ edema- reports doesn't tolerate compression stockings due to comfort with heat Skin: warm, dry     Assessment and Plan   #Emergency Department follow up- patient with bee sting after moving tree branchh- noted worsening swelling and he has epipen at home and used it- this was updated and given Pepcid and prednisone- discharged home. Doing ok- no further issues  #Mild cognitive impairment-was started on Aricept 10 mg  and namenda 5 mg BID by Dr. Karel Jarvis and Durenda Age, PA. He is doing well with this medicine- reports stability  but no improvement- discussed common   #hypertension S: medication: off medication lately BP Readings from Last 3 Encounters:  05/13/23 110/70  05/03/23 (!) 138/90  01/02/23 102/80  A/P: stable- continue without medicines   #hyperlipidemia S: Medication: rosuvastatin 40 mg Lab Results  Component Value Date   CHOL 157 06/14/2022   HDL 46.30 06/14/2022   LDLCALC 84 06/14/2022   LDLDIRECT 86.0 09/13/2019   TRIG 134.0 06/14/2022   CHOLHDL 3 06/14/2022   A/P: very slight elevatoins but at his age do not feel strongly about pushing dose as would require adding a second medicine  # Hyperglycemia/insulin resistance/prediabetes S:  Medication: none Exercise and diet-active with work but no exericse at home. Weight  stable from march Lab Results  Component Value Date   HGBA1C 6.0 01/02/2023   HGBA1C 5.9 06/14/2022   HGBA1C 6.0 12/05/2021    A/P: hopefully stable- update a1c next visit. Continue current meds for now   # Depression S: Medication:Pristiq 50 mg --> 25 mg then off- notes more sleeping, more focus on pain    05/13/2023    2:26 PM 01/02/2023    8:13 AM 11/21/2022    8:08 AM  Depression screen PHQ 2/9  Decreased Interest 1 0 0  Down, Depressed, Hopeless 1 0 0  PHQ - 2 Score 2 0 0  Altered sleeping 0 0   Tired, decreased energy 1 0   Change in appetite 0 0   Feeling bad or failure about yourself  1 0   Trouble concentrating 0 0   Moving slowly or fidgety/restless 0 0   Suicidal thoughts 0 0   PHQ-9 Score 4 0   Difficult doing work/chores Somewhat difficult Not difficult at all    A/P: phq9 scores up some and more sleepy/more focus on pain- we opted to retrial Pristiq 25 each morning  # Joint pain S:patient reports pain in knees, elbows, wrists- hard to get out of chair at time due to pain. Takes NSAIDs even though wife has told him not to take and I reinforced this.  No morning stiffness.  A/P: advised against NSAIDs- hoping treating depression may help some.    #allergies- some issues with allergies in past- doing ok lately- is going to trial off  #smoking- still smokgin- encouraged cessation- he declines  Recommended follow up: No follow-ups on file. Future Appointments  Date Time Provider Department Center  06/13/2023  9:00 AM Marcos Eke, [place] [facility] None  09/19/2023  8:30 AM Rosann Auerbach, PhD [facility] None  09/19/2023  9:30 AM [facility] None  09/26/2023 10:00 AM Rosann Auerbach, PhD [facility] None  12/11/2023  7:45 AM [facility] ANNUAL WELLNESS VISIT 1 [facility]    Lab/Order associations: No diagnosis found.  Meds ordered this encounter  Medications   desvenlafaxine (PRISTIQ) 25 MG 24 hr tablet    Sig: Take 1 tablet (25 mg total) by mouth  daily.    Dispense:  90 tablet    Refill:  3    Return precautions advised.  Tana Conch, MD

## 2023-05-13 NOTE — Patient Instructions (Addendum)
we opted to retrial Pristiq 25 each morning  Lets pause on claritin for now and see if that helps your drowsiness- if allergies worsen in fall can restart  Could try Voltaren gel for your knees and other superficial joints- but do not take medicine like ibuprofen/aleve/motrin or other NSAIDs like this as can increase risk of bleeding and hurt kidney function   Recommended follow up: Return in about 3 months (around 08/13/2023) for followup or sooner if needed.Schedule b4 you leave. Likely sometime early November to see how you are doing back on pristiq

## 2023-05-19 ENCOUNTER — Encounter: Payer: Self-pay | Admitting: Family Medicine

## 2023-05-20 MED ORDER — DESVENLAFAXINE SUCCINATE ER 50 MG PO TB24: 50.0000 mg | ORAL_TABLET | Freq: Every day | ORAL | 3 refills | Status: DC

## 2023-05-29 ENCOUNTER — Encounter (INDEPENDENT_AMBULATORY_CARE_PROVIDER_SITE_OTHER): Payer: Self-pay

## 2023-05-30 ENCOUNTER — Other Ambulatory Visit: Payer: Self-pay | Admitting: Family Medicine

## 2023-06-08 ENCOUNTER — Other Ambulatory Visit: Payer: Self-pay | Admitting: Family Medicine

## 2023-06-12 NOTE — Progress Notes (Signed)
Assessment/Plan:   Mild cognitive impairment  Rodney Scott is a very pleasant 72 y.o. RH male with a history of hypertension with recent hypotensive event, hyperlipidemia, Factor V Leiden deficiency, DVT on Xarelto, and a diagnosis of mild cognitive impairment likely due to Alzheimer disease as per neuropsych evaluation in August 2023.  He is on donepezil 10 mg daily, tolerating well.  He is also on memantine 5 mg nightly since his last visit, presenting today in follow-up for evaluation of memory loss.  MMSE today is stable at 27/30, however his wife reports changes in his memory, especially short-term.  He still able to participate in his ADLs and drives locally but needs the use of GPS more frequently.   Recommendations:   Follow up in 6  months. Increase memantine to 10 twice a day and continue donepezil 10 mg daily, side effects discussed  Repeat neuropsych evaluation scheduled for December 2024 for clarity of the diagnosis and disease progression Monitor driving. Recommend good control of cardiovascular risk factors Continue to control mood as per PCP    Subjective:   This patient is accompanied in the office by his wife who supplements the history. Previous records as well as any outside records available were reviewed prior to todays visit.   Patient was last seen on 12/06/2022, with MMSE of 25/30.     Any changes in memory since last visit? "It is worse"-wife says.  His wife has to repeat  and write several times the scheduled appointments because he forgets.  His wife reports that his thoughts are at times random, not connected to the context of the conversation. "He saw a picture of someone he knew for 35 years and could not recognize her". He has difficulty remembering recent conversations and names or new information.  He continues to work.  repeats oneself?  Endorsed Disoriented when walking into a room?  Patient denies  Leaving objects in unusual places? Endorsed, such  as emptying the dishwasher and not remembering where he emptied the stuff to, "his workshop is a mess, he cannot find his suff, he is disorganized "-his wife says.    Wandering behavior?   Denies.   Any personality changes since last visit?  "Maybe a little more argumentative when he thinks he is right about something ". Any worsening depression?:  He has a history of depression, controlled with medications by his PCP. Hallucinations or paranoia?  denies   Seizures?   denies    Any sleep changes? Sleeps well, maybe sleeps more on the weekend.  Endorses vivid dreams, denies REM behavior or sleepwalking   Sleep apnea?   denies    Any hygiene concerns?   "Wearing clothes too many times ", but takes showers daily   Independent of bathing and dressing?  Needs help to choose clothing. Does the patient needs help with medications? Patient is in charge   Who is in charge of the finances?  Wife is in charge, but he still pays some of the bills     Any changes in appetite?  Denies.     Patient have trouble swallowing?  denies   Does the patient cook?  He makes breakfast and lunch. Any kitchen accidents such as leaving the stove on?  Not recently, but in the past he left the stove on and walked away. Any headaches?  Denies.   Vision changes? Denies. Chronic pain?  Denies, only some arthritic pain   Ambulates with difficulty?    Denies.  Recent falls or head injuries? Denies.      Unilateral weakness, numbness or tingling? Denies.   Any tremors?  Denies.   Any anosmia?  Endorsed, for the last 4 years. Any incontinence of urine?  Denies.   Any bowel dysfunction?  Denies.      Patient lives with wife    Does the patient drive?  Only locally, and uses a GPS more frequently  MRI of the brain from November 2023, personally reviewed is remarkable for mild chronic small vessel ischemic changes slightly progressing from the prior MRI in 2020.  There is mild generalized cerebral atrophy but stable.        Neuropsychological Evaluation , Dr. Milbert Coulter  06/03/22. " Briefly, results suggested  prominent impairment surrounding all aspects of verbal memory, as well as recognition/consolidation aspects of visual memory. Writing samples also exhibited impairment with evidence for agrammatism; however, other tasks assessing receptive and expressive language remained normatively appropriate. A relative weakness was exhibited across executive functioning, while performance variability was exhibited across processing speed. Relative to his previous evaluation in April 2022, mild performance declines were exhibited across processing speed, executive functioning, and verbal memory. As stated in previous reports, Alzheimer's disease remains a concern. Despite some evidence to suggest adequate encoding (i.e., learning), retention rates were essentially amnestic across verbal tasks. He also performed poorly across all recognition tasks. This suggests evidence for rapid forgetting and an evolving and already quite significant memory storage impairment, both of which are the hallmark characteristics of this illness. The fact that declines have been quite mild over time is certainly encouraging and would suggest a slowed disease process if truly present.   History on Initial Assessment 09/25/2018: This is a 72 year old right-handed man with a history of hypertension, hyperlipidemia, Factor V Leiden deficiency, DVT on Xarelto, presenting for evaluation of worsening memory. He and his wife started noticing changes over the past year. He states long-term memory is much better than remembering what he did yesterday. He misplaces things frequently at home, they have lived in the same house for 30 years and his wife has to search for things that have always been in the same place for years. He denies getting lost driving but uses his GPS a lot, he cannot remember the way to places like before. He procrastinates on bills but generally does  not miss bills, his wife nods that he has. He occasionally forgets his medications. He continues to work in [employer] and denies any difficulties performing his job. His wife's major concern is how this has affected caring for their 65 year old granddaughter who has been living with them for the past 5 years. He forgets to feed her, which is new. He goes to a store and forgets what he is doing, she has to give him a pretty detailed note so he can remember events for the day. She has noticed his language skills have decreased markedly, he is not a very verbal person, but she has noticed more difficulty expressing his thoughts or finding words. He may have a thought but stumbles over the words. He repeats himself, one time last summer he told the same story 3-4 times within half an hour. He has always been easily frustrated, but she feels it is worse, such as when driving, "no one knows what they are doing." No paranoia or hallucinations.    He denies any headaches, dizziness, vision changes, neck/back pain, focal numbness/tingling/weakness, bowel/bladder dysfunction, or tremors. He has lost his sense  of smell over the past year for no clear reason. He denies any head injuries. His mother had dementia in her 51s. 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 When asked about mood, he states "I don't know." His wife does not think he is depressed, she is unsure why he is still taking the Wellbutrin and Pristiq, she feels Wellbutrin was started 20 years ago for smoking cessation.            Past Medical History:  Diagnosis Date   Allergic rhinitis 07/31/2007   Loratadine at times- but mild mental fog on this    BPH associated with nocturia 03/10/2018   Clotting disorder    Current smoker 07/26/2014   Has quit several several times and restarted.  Most recently quit 11/06/14. Need to keep a close eye.    Essential hypertension, benign 07/26/2014   Benazepril 20mg     Factor V Leiden 07/31/2007   Xarelto 20mg  daily. 2 DVT 2001, PE 2011.     History of colonic polyps 07/31/2007   02/2015 adenoma. 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 Patient states thought was external.      Left inguinal hernia 12/05/2016   Declines surgery referral- minimally bothering him. Can call in if he changes his mind   Major depressive disorder in full remission 03/24/2009   Wellbutrin xl 300mg -->150mg --> off 03/2020  pristiq 50mg .   In past-->remeron 15mg  mainly takes prn for sleep zoloft --> pristiq per sister who is psychiatrist apparetnly   MCI (mild cognitive impairment) 06/15/2015   Osteoarthritis    Right rotator cuff tear 12/07/2010   S/p surgery.     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 COmpression stocking on left. L >R swelling-history of 1st DVT in L.        Past Surgical History:  Procedure Laterality Date   COLONOSCOPY     right rotator cuff repair     SALIVARY STONE REMOVAL       PREVIOUS MEDICATIONS:   CURRENT MEDICATIONS:  Outpatient Encounter Medications as of 06/13/2023  Medication Sig   desvenlafaxine (PRISTIQ) 50 MG 24 hr tablet Take 1 tablet (50 mg total) by mouth daily.   EPINEPHrine (EPIPEN 2-PAK) 0.3 mg/0.3 mL IJ SOAJ injection Inject 0.3 mg into the muscle as needed for anaphylaxis.   Multiple Vitamin (MULTIVITAMIN) capsule Take by mouth.   rosuvastatin (CRESTOR) 40 MG tablet Take 1 tablet by mouth once daily   triamcinolone cream (KENALOG) 0.1 % Apply 1 Application topically 2 (two) times daily.   XARELTO 20 MG TABS tablet Take 1 tablet by  mouth once daily   [DISCONTINUED] donepezil (ARICEPT) 10 MG tablet Take 10 mg by mouth at bedtime.   [DISCONTINUED] memantine (NAMENDA) 5 MG tablet Take 1 tablet (5 mg at night) for 2 weeks, then increase to 1 tablet (5 mg) twice a day   donepezil (ARICEPT) 10 MG tablet Take 1 tablet (10 mg total) by mouth at bedtime.   memantine (NAMENDA) 10 MG tablet Take 1 tablet  twice a day   No facility-administered encounter medications on file as of 06/13/2023.     Objective:     PHYSICAL EXAMINATION:    VITALS:   Vitals:   06/13/23 0851  BP: 123/82  Pulse: 70  SpO2: 98%  Weight: 224 lb (101.6 kg)  Height: 6\' 3"  (1.905 m)    GEN:  The patient appears stated age and is in NAD. HEENT:  Normocephalic, atraumatic.   Neurological examination:  General: NAD, well-groomed, appears stated age. Orientation: The patient is alert. Oriented to person, place and date Cranial nerves: There is good facial symmetry.The speech is fluent and clear. No aphasia or dysarthria. Fund of knowledge is appropriate. Recent memory and remote memory impaired. Attention and concentration are normal.  Able to name objects and repeat phrases.  Hearing is intact to conversational tone.   Delayed recall 0/3  Sensation: Sensation is intact to light touch throughout Motor: Strength is at least antigravity x4. DTR's 2/4 in UE/LE      04/23/2019    9:00 AM 12/14/2018    2:00 PM 09/25/2018   10:00 AM  Montreal Cognitive Assessment   Visuospatial/ Executive (0/5) 5 4 5   Naming (0/3) 3 3 3   Attention: Read list of digits (0/2) 2 2 2   Attention: Read list of letters (0/1) 0 1 1  Attention: Serial 7 subtraction starting at 100 (0/3) 3 3 3   Language: Repeat phrase (0/2) 2 2 2   Language : Fluency (0/1) 1 1 1   Abstraction (0/2) 2 2 2   Delayed Recall (0/5) 0 0 0  Orientation (0/6) 6 6 6   Total 24 24 25   Adjusted Score (based on education) 25 25        06/13/2023   12:00 PM 12/06/2022   12:00 PM 06/07/2022    9:00 AM   MMSE - Mini Mental State Exam  Orientation to time 5 5 5   Orientation to Place 5 5 5   Registration 3 3 3   Attention/ Calculation 5 4 5   Recall 0 0 2  Language- name 2 objects 2 2 2   Language- repeat 1 1 1   Language- follow 3 step command 3 3 3   Language- read & follow direction 1 1 1   Write a sentence 1 0 1  Copy design 1 1 1   Total score 27 25 29        Movement examination: Tone: There is normal tone in the UE/LE Abnormal movements:  no tremor.  No myoclonus.  No asterixis.   Coordination:  There is no decremation with RAM's. Normal finger to nose  Gait and Station: The patient has no difficulty arising out of a deep-seated chair without the use of the hands. The patient's stride length is good.  Gait is cautious and narrow.   Thank you for allowing [place] the opportunity to participate in the care of this nice patient. Please do not hesitate to contact us for any questions or concerns.   Total time spent on today's visit was 25 minutes dedicated to this patient today, preparing to see patient, examining the patient, ordering tests and/or medications and counseling the patient, documenting clinical information in the EHR or other health record, independently interpreting results and communicating results to the patient/family, discussing treatment and goals, answering patient's questions and coordinating care.  Cc:  Shelva Majestic, MD  Marlowe Kays 06/13/2023 12:32 PM

## 2023-06-13 ENCOUNTER — Ambulatory Visit (INDEPENDENT_AMBULATORY_CARE_PROVIDER_SITE_OTHER): Payer: BC Managed Care – PPO | Admitting: Physician Assistant

## 2023-06-13 ENCOUNTER — Encounter: Payer: Self-pay | Admitting: Physician Assistant

## 2023-06-13 VITALS — BP 123/82 | HR 70 | Ht 75.0 in | Wt 224.0 lb

## 2023-06-13 DIAGNOSIS — G3184 Mild cognitive impairment, so stated: Secondary | ICD-10-CM

## 2023-06-13 MED ORDER — DONEPEZIL HCL 10 MG PO TABS: 10.0000 mg | ORAL_TABLET | Freq: Every day | ORAL | 3 refills | Status: DC

## 2023-06-13 MED ORDER — MEMANTINE HCL 10 MG PO TABS: ORAL_TABLET | ORAL | 11 refills | Status: DC

## 2023-06-13 NOTE — Patient Instructions (Signed)
Good to see you.  Continue Donepezil 10mg  daily.  Increase memantine to  10 mg twice a day  Repeat Neuropsych  on Dec 2024  Follow-up in 6 months     FALL PRECAUTIONS: Be cautious when walking. Scan the area for obstacles that may increase the risk of trips and falls. When getting up in the mornings, sit up at the edge of the bed for a few minutes before getting out of bed. Consider elevating the bed at the head end to avoid drop of blood pressure when getting up. Walk always in a well-lit room (use night lights in the walls). Avoid area rugs or power cords from appliances in the middle of the walkways. Use a walker or a cane if necessary and consider physical therapy for balance exercise. Get your eyesight checked regularly.  FINANCIAL OVERSIGHT: Supervision, especially oversight when making financial decisions or transactions is also recommended as difficulties arise.  HOME SAFETY: Consider the safety of the kitchen when operating appliances like stoves, microwave oven, and blender. Consider having supervision and share cooking responsibilities until no longer able to participate in those. Accidents with firearms and other hazards in the house should be identified and addressed as well.  DRIVING: Regarding driving, in patients with progressive memory problems, driving will be impaired. We advise to have someone else do the driving if trouble finding directions or if minor accidents are reported. Independent driving assessment is available to determine safety of driving.  ABILITY TO BE LEFT ALONE: If patient is unable to contact 911 operator, consider using LifeLine, or when the need is there, arrange for someone to stay with patients. Smoking is a fire hazard, consider supervision or cessation. Risk of wandering should be assessed by caregiver and if detected at any point, supervision and safe proof recommendations should be instituted.  MEDICATION SUPERVISION: Inability to self-administer  medication needs to be constantly addressed. Implement a mechanism to ensure safe administration of the medications.  RECOMMENDATIONS FOR ALL PATIENTS WITH MEMORY PROBLEMS: 1. Continue to exercise (Recommend 30 minutes of walking everyday, or 3 hours every week) 2. Increase social interactions - continue going to [place] and enjoy social gatherings with friends and family 3. Eat healthy, avoid fried foods and eat more fruits and vegetables 4. Maintain adequate blood pressure, blood sugar, and blood cholesterol level. Reducing the risk of stroke and cardiovascular disease also helps promoting better memory. 5. Avoid stressful situations. Live a simple life and avoid aggravations. Organize your time and prepare for the next day in anticipation. 6. Sleep well, avoid any interruptions of sleep and avoid any distractions in the bedroom that may interfere with adequate sleep quality 7. Avoid sugar, avoid sweets as there is a strong link between excessive sugar intake, diabetes, and cognitive impairment We discussed the Mediterranean diet, which has been shown to help patients reduce the risk of progressive memory disorders and reduces cardiovascular risk. This includes eating fish, eat fruits and green leafy vegetables, nuts like almonds and hazelnuts, walnuts, and also use olive oil. Avoid fast foods and fried foods as much as possible. Avoid sweets and sugar as sugar use has been linked to worsening of memory function.      Mediterranean Diet  Why follow it? Research shows. Those who follow the Mediterranean diet have a reduced risk of heart disease  The diet is associated with a reduced incidence of Parkinson's and Alzheimer's diseases People following the diet may have longer life expectancies and lower rates of chronic diseases  The  Dietary Guidelines for Americans recommends the Mediterranean diet as an eating plan to promote health and prevent disease  What Is the Mediterranean Diet?  Healthy  eating plan based on typical foods and recipes of Mediterranean-style cooking The diet is primarily a plant based diet; these foods should make up a majority of meals   Starches - Plant based foods should make up a majority of meals - They are an important sources of vitamins, minerals, energy, antioxidants, and fiber - Choose whole grains, foods high in fiber and minimally processed items  - Typical grain sources include wheat, oats, barley, corn, brown rice, bulgar, farro, millet, polenta, couscous  - Various types of beans include chickpeas, lentils, fava beans, black beans, white beans   Fruits  Veggies - Large quantities of antioxidant rich fruits & veggies; 6 or more servings  - Vegetables can be eaten raw or lightly drizzled with oil and cooked  - Vegetables common to the traditional Mediterranean Diet include: artichokes, arugula, beets, broccoli, brussel sprouts, cabbage, carrots, celery, collard greens, cucumbers, eggplant, kale, leeks, lemons, lettuce, mushrooms, okra, onions, peas, peppers, potatoes, pumpkin, radishes, rutabaga, shallots, spinach, sweet potatoes, turnips, zucchini - Fruits common to the Mediterranean Diet include: apples, apricots, avocados, cherries, clementines, dates, figs, grapefruits, grapes, melons, nectarines, oranges, peaches, pears, pomegranates, strawberries, tangerines  Fats - Replace butter and margarine with healthy oils, such as olive oil, canola oil, and tahini  - Limit nuts to no more than a handful a day  - Nuts include walnuts, almonds, pecans, pistachios, pine nuts  - Limit or avoid candied, honey roasted or heavily salted nuts - Olives are central to the [employer] - can be eaten whole or used in a variety of dishes   Meats Protein - Limiting red meat: no more than a few times a month - When eating red meat: choose lean cuts and keep the portion to the size of deck of cards - Eggs: approx. 0 to 4 times a week  - Fish and lean poultry: at  least 2 a week  - Healthy protein sources include, chicken, [place], lean beef, lamb - Increase intake of seafood such as tuna, salmon, trout, mackerel, shrimp, scallops - Avoid or limit high fat processed meats such as sausage and bacon  Dairy - Include moderate amounts of low fat dairy products  - Focus on healthy dairy such as fat free yogurt, skim milk, low or reduced fat cheese - Limit dairy products higher in fat such as whole or 2% milk, cheese, ice cream  Alcohol - Moderate amounts of red wine is ok  - No more than 5 oz daily for women (all ages) and men older than age 71  - No more than 10 oz of wine daily for men younger than 66  Other - Limit sweets and other desserts  - Use herbs and spices instead of salt to flavor foods  - Herbs and spices common to the traditional Mediterranean Diet include: basil, bay leaves, chives, cloves, cumin, fennel, garlic, lavender, marjoram, mint, oregano, parsley, pepper, rosemary, sage, savory, sumac, tarragon, thyme   It's not just a diet, it's a lifestyle:  The Mediterranean diet includes lifestyle factors typical of those in the region  Foods, drinks and meals are best eaten with others and savored Daily physical activity is important for overall good health This could be strenuous exercise like running and aerobics This could also be more leisurely activities such as walking, housework, yard-work, or taking the stairs Moderation  is the key; a balanced and healthy diet accommodates most foods and drinks Consider portion sizes and frequency of consumption of certain foods   Meal Ideas & Options:  Breakfast:  Whole wheat toast or whole wheat English muffins with peanut butter & hard boiled egg Steel cut oats topped with apples & cinnamon and skim milk  Fresh fruit: banana, strawberries, melon, berries, peaches  Smoothies: strawberries, bananas, greek yogurt, peanut butter Low fat greek yogurt with blueberries and granola  Egg white omelet with  spinach and mushrooms Breakfast couscous: whole wheat couscous, apricots, skim milk, cranberries  Sandwiches:  Hummus and grilled vegetables (peppers, zucchini, squash) on whole wheat bread   Grilled chicken on whole wheat pita with lettuce, tomatoes, cucumbers or tzatziki  [place] salad on whole wheat bread: tuna salad made with greek yogurt, olives, red peppers, capers, green onions Garlic rosemary lamb pita: lamb sauted with garlic, rosemary, salt & pepper; add lettuce, cucumber, greek yogurt to pita - flavor with lemon juice and black pepper  Seafood:  Mediterranean grilled salmon, seasoned with garlic, basil, parsley, lemon juice and black pepper Shrimp, lemon, and spinach whole-grain pasta salad made with low fat greek yogurt  Seared scallops with lemon orzo  Seared tuna steaks seasoned salt, pepper, coriander topped with tomato mixture of olives, tomatoes, olive oil, minced garlic, parsley, green onions and cappers  Meats:  Herbed greek chicken salad with kalamata olives, cucumber, feta  Red bell peppers stuffed with spinach, bulgur, lean ground beef (or lentils) & topped with feta   Kebabs: skewers of chicken, tomatoes, onions, zucchini, squash  [place] burgers: made with red onions, mint, dill, lemon juice, feta cheese topped with roasted red peppers Vegetarian Cucumber salad: cucumbers, artichoke hearts, celery, red onion, feta cheese, tossed in olive oil & lemon juice  Hummus and whole grain pita points with a greek salad (lettuce, tomato, feta, olives, cucumbers, red onion) Lentil soup with celery, carrots made with vegetable broth, garlic, salt and pepper  Tabouli salad: parsley, bulgur, mint, scallions, cucumbers, tomato, radishes, lemon juice, olive oil, salt and pepper.

## 2023-06-18 ENCOUNTER — Other Ambulatory Visit: Payer: Self-pay

## 2023-06-18 MED ORDER — RIVAROXABAN 20 MG PO TABS: 20.0000 mg | ORAL_TABLET | Freq: Every day | ORAL | 3 refills | Status: DC

## 2023-07-07 ENCOUNTER — Encounter: Payer: Self-pay | Admitting: Family Medicine

## 2023-07-07 ENCOUNTER — Ambulatory Visit (INDEPENDENT_AMBULATORY_CARE_PROVIDER_SITE_OTHER): Payer: BC Managed Care – PPO | Admitting: Family Medicine

## 2023-07-07 VITALS — BP 100/70 | HR 76 | Temp 97.9°F | Ht 75.0 in | Wt 226.6 lb

## 2023-07-07 DIAGNOSIS — M25561 Pain in right knee: Secondary | ICD-10-CM

## 2023-07-07 DIAGNOSIS — R7303 Prediabetes: Secondary | ICD-10-CM | POA: Diagnosis not present

## 2023-07-07 DIAGNOSIS — G5601 Carpal tunnel syndrome, right upper limb: Secondary | ICD-10-CM

## 2023-07-07 DIAGNOSIS — F3342 Major depressive disorder, recurrent, in full remission: Secondary | ICD-10-CM

## 2023-07-07 DIAGNOSIS — G8929 Other chronic pain: Secondary | ICD-10-CM | POA: Diagnosis not present

## 2023-07-07 DIAGNOSIS — Z720 Tobacco use: Secondary | ICD-10-CM

## 2023-07-07 DIAGNOSIS — M25562 Pain in left knee: Secondary | ICD-10-CM | POA: Diagnosis not present

## 2023-07-07 DIAGNOSIS — I10 Essential (primary) hypertension: Secondary | ICD-10-CM | POA: Diagnosis not present

## 2023-07-07 MED ORDER — BUPROPION HCL ER (XL) 150 MG PO TB24: 150.0000 mg | ORAL_TABLET | Freq: Every day | ORAL | 5 refills | Status: DC

## 2023-07-07 NOTE — Patient Instructions (Addendum)
Let us know on flu and COVID shots from pharmacy  #Carpal tunnel- notes some numbness/tingling including at night and when he wakes up in first 4 fingers on right hand. Is going to try cock up wrist splint from pharmacy or online and if not improving we can refer to sports medicine or orthopedic   For knees- could trial voltraren gel up to 4x a day on knees for 2 weeks. If no better could refer to orthopedics or sports medicine   We have placed a referral for you today to physical therapy. In some cases you will see # listed below- you can call this if you have not heard within a week. If you do not see # listed- you should receive a mychart message or phone call within a week with the # to call directly- call that as soon as you get it. If you are having issues getting scheduled reach out to [place] again.   For depression-  reports improvement with PHQ9 down to 1 and only for feeling tired- opted to continue current medications- had been on Wellbutrin in past and wants to retrial as well (not dealing with anxiety at this point) -since improving you preferred for 6 month follow up but if any thoughts of self harm let us know ASAP     Recommended follow up: Return in about 6 months (around 01/04/2024) for followup or sooner if needed.Schedule b4 you leave. -cancel November 8th on way out

## 2023-07-07 NOTE — Progress Notes (Signed)
Phone [phone] In person visit   Subjective:   Rodney Scott is a 72 y.o. year old very pleasant male patient who presents for/with See problem oriented charting Chief Complaint  Patient presents with   Depression    Past Medical History-  Patient Active Problem List   Diagnosis Date Noted   MCI (mild cognitive impairment) 06/15/2015    Priority: High   Current smoker 07/26/2014    Priority: High   Factor V Leiden (HCC) 07/31/2007    Priority: High   History of venous thrombosis and embolism 07/31/2007    Priority: High   BPH associated with nocturia 03/10/2018    Priority: Medium    Hyperglycemia 12/05/2016    Priority: Medium    Essential hypertension, benign 07/26/2014    Priority: Medium    Major depressive disorder in full remission 03/24/2009    Priority: Medium    Hyperlipidemia 07/31/2007    Priority: Medium    Senile purpura 09/22/2020    Priority: Low   Left inguinal hernia 12/05/2016    Priority: Low   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    Priority: Low   Osteoarthritis 07/15/2008    Priority: Low   Venous (peripheral) insufficiency 12/11/2007    Priority: Low   Allergic rhinitis 07/31/2007    Priority: Low   History of colonic polyps 07/31/2007    Priority: Low   Major depressive disorder, recurrent episode, moderate (HCC) 05/13/2023   Chronic anticoagulation 10/18/2022   Family history of colon cancer in father 10/18/2022    Medications- reviewed and updated Current Outpatient Medications  Medication Sig Dispense Refill   buPROPion (WELLBUTRIN XL) 150 MG 24 hr tablet Take 1 tablet (150 mg total) by mouth daily. 30 tablet 5   desvenlafaxine (PRISTIQ) 50 MG 24 hr tablet Take 1 tablet (50 mg total) by mouth daily. 90 tablet 3   donepezil (ARICEPT) 10 MG tablet Take 1 tablet (10 mg total) by mouth at bedtime. 90 tablet 3   memantine (NAMENDA) 10 MG tablet Take 1 tablet  twice a day 60 tablet 11   Multiple Vitamin (MULTIVITAMIN)  capsule Take by mouth.     rivaroxaban (XARELTO) 20 MG TABS tablet Take 1 tablet (20 mg total) by mouth daily. 90 tablet 3   rosuvastatin (CRESTOR) 40 MG tablet Take 1 tablet by mouth once daily 90 tablet 0   triamcinolone cream (KENALOG) 0.1 % Apply 1 Application topically 2 (two) times daily. 30 g 0   EPINEPHrine (EPIPEN 2-PAK) 0.3 mg/0.3 mL IJ SOAJ injection Inject 0.3 mg into the muscle as needed for anaphylaxis. (Patient not taking: Reported on 07/07/2023) 1 each 0   No current facility-administered medications for this visit.     Objective:  BP 100/70   Pulse 76   Temp 97.9 F (36.6 C)   Ht 6\' 3"  (1.905 m)   Wt 226 lb 9.6 oz (102.8 kg)   SpO2 96%   BMI 28.32 kg/m  Gen: NAD, resting comfortably CV: RRR no murmurs rubs or gallops Lungs: CTAB no crackles, wheeze, rhonchi Abdomen: soft/nontender/nondistended/normal bowel sounds. No rebound or guarding.  Ext: trace edema Left> right with venous stasis skin changes- not wearing his compression Skin: warm, dry    Assessment and Plan   # Depression S: Medication: Had worsening of depressive symptoms when he came off of Pristiq-more sleeping and more focused on his pain.  We restarted Pristiq 25 mg- later wife reched out and found out that he had been on 25  mg and we went back up to 50 mg -still seems subdued- seemed better on wellbutrin    05/13/2023    2:26 PM 01/02/2023    8:13 AM 11/21/2022    8:08 AM  Depression screen PHQ 2/9  Decreased Interest 1 0 0  Down, Depressed, Hopeless 1 0 0  PHQ - 2 Score 2 0 0  Altered sleeping 0 0   Tired, decreased energy 1 0   Change in appetite 0 0   Feeling bad or failure about yourself  1 0   Trouble concentrating 0 0   Moving slowly or fidgety/restless 0 0   Suicidal thoughts 0 0   PHQ-9 Score 4 0   Difficult doing work/chores Somewhat difficult Not difficult at all   A/P: reports improvement with PHQ9 down to 1 and only for feeling tired- opted to continue current medications- had  been on Wellbutrin in past and wants to retrial as well (not dealing with anxiety at this point)  #hypertension S: medication: None-has been off medication BP Readings from Last 3 Encounters:  07/07/23 100/70  06/13/23 123/82  05/13/23 110/70  A/P: doing well without mediine- continue to monitor    # Hyperglycemia/insulin resistance/prediabetes S:  Medication: none Lab Results  Component Value Date   HGBA1C 6.0 01/02/2023   HGBA1C 5.9 06/14/2022   HGBA1C 6.0 12/05/2021   A/P: due for repeat a1c but will do at follow up visit per his request- working on mobility may help with diabetes prevention  #Carpal tunnel- notes some numbness/tingling including at night and when he wakes up in first 4 fingers on right hand. Is going to try cock up wrist splint from pharmacy or online and if not improving we can refer to sports medicine or orthopedic   #Arthritis- knees, hands possibly- could trial voltraren gel up to 4x a day on knees for 2 weeks -wife has noted at times needs to get out of chair and pain may have affected him -we will also trial physical therapy for likely osteoarthritis and see if this helps with mobility  #Smoking- 15-20 a day- encouraged full cessation- he declines. Enrolled in lung cancer screening    Recommended follow up: Return in about 6 months (around 01/04/2024) for physical or sooner if needed.Schedule b4 you leave. Future Appointments  Date Time Provider Department Center  08/22/2023  3:20 PM Shelva Majestic, MD [facility]  09/19/2023  8:30 AM Rosann Auerbach, PhD [facility] None  09/19/2023  9:30 AM [facility] NEUROPSYCH TECH [facility] None  09/26/2023 10:00 AM Rosann Auerbach, PhD [facility] None  12/11/2023  7:45 AM [facility] ANNUAL WELLNESS VISIT 1 [facility]  12/19/2023  9:00 AM Wertman, Sung Amabile, PA-C [facility] None    Lab/Order associations:   ICD-10-CM   1. Major depressive disorder, recurrent episode, moderate (HCC)  F33.1     2. Essential hypertension, benign  I10      3. Chronic pain of both knees  M25.561 Ambulatory referral to Physical Therapy   M25.562 CANCELED: Ambulatory referral to Physical Therapy   G89.29 CANCELED: Ambulatory referral to Physical Therapy      Meds ordered this encounter  Medications   buPROPion (WELLBUTRIN XL) 150 MG 24 hr tablet    Sig: Take 1 tablet (150 mg total) by mouth daily.    Dispense:  30 tablet    Refill:  5    Return precautions advised.  Tana Conch, MD

## 2023-07-14 ENCOUNTER — Encounter: Payer: Self-pay | Admitting: Family Medicine

## 2023-07-14 NOTE — Telephone Encounter (Signed)
See below

## 2023-07-29 ENCOUNTER — Ambulatory Visit (HOSPITAL_COMMUNITY): Payer: BC Managed Care – PPO | Attending: Family Medicine

## 2023-07-29 DIAGNOSIS — G8929 Other chronic pain: Secondary | ICD-10-CM | POA: Diagnosis not present

## 2023-07-29 DIAGNOSIS — M6281 Muscle weakness (generalized): Secondary | ICD-10-CM | POA: Insufficient documentation

## 2023-07-29 DIAGNOSIS — M25561 Pain in right knee: Secondary | ICD-10-CM | POA: Insufficient documentation

## 2023-07-29 DIAGNOSIS — M25661 Stiffness of right knee, not elsewhere classified: Secondary | ICD-10-CM | POA: Diagnosis not present

## 2023-07-29 DIAGNOSIS — M25562 Pain in left knee: Secondary | ICD-10-CM | POA: Insufficient documentation

## 2023-07-29 NOTE — Therapy (Signed)
OUTPATIENT PHYSICAL THERAPY EVALUATION   Patient Name: Rodney Scott MRN: [id number] DOB:18-Jun-1951, 72 y.o., male Today's Date: 07/29/2023  END OF SESSION:  PT End of Session - 07/29/23 1307     Visit Number 1    Number of Visits 12    Date for PT Re-Evaluation 09/09/23    Authorization Type [employer]; [place] Medicare HMO    Authorization Time Period 07/29/23-09/09/23    Progress Note Due on Visit 10    PT Start Time 1300    PT Stop Time 1330    PT Time Calculation (min) 30 min    Equipment Utilized During Treatment Gait belt    Activity Tolerance Patient tolerated treatment well;No increased pain    Behavior During Therapy [facility] for tasks assessed/performed             Past Medical History:  Diagnosis Date   Allergic rhinitis 07/31/2007   Loratadine at times- but mild mental fog on this    BPH associated with nocturia 03/10/2018   Clotting disorder    Current smoker 07/26/2014   Has quit several several times and restarted. Most recently quit 11/06/14. Need to keep a close eye.    Essential hypertension, benign 07/26/2014   Benazepril 20mg     Factor V Leiden 07/31/2007   Xarelto 20mg  daily. 2 DVT 2001, PE 2011.     History of colonic polyps 07/31/2007   02/2015 adenoma. 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 Patient states thought was external.      Left inguinal hernia 12/05/2016   Declines surgery referral- minimally bothering him. Can call in if he changes his mind   Major depressive disorder in full remission 03/24/2009   Wellbutrin xl 300mg -->150mg --> off 03/2020  pristiq 50mg .   In past-->remeron 15mg  mainly takes prn for sleep zoloft --> pristiq per sister who is psychiatrist apparetnly   MCI (mild cognitive impairment) 06/15/2015   Osteoarthritis    Right rotator cuff tear 12/07/2010   S/p  surgery.     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 COmpression stocking on left. L >R swelling-history of 1st DVT in L.      Past Surgical History:  Procedure Laterality Date   COLONOSCOPY     right rotator cuff repair     SALIVARY STONE REMOVAL     Patient Active Problem List   Diagnosis Date Noted   Major depressive disorder, recurrent episode, moderate (HCC) 05/13/2023   Chronic anticoagulation 10/18/2022   Family history of colon cancer in father 10/18/2022   Senile purpura 09/22/2020   BPH associated with nocturia 03/10/2018   Hyperglycemia 12/05/2016   Left inguinal hernia 12/05/2016   MCI (mild cognitive impairment) 06/15/2015   Essential hypertension, benign 07/26/2014   Current smoker 07/26/2014   Major depressive disorder in full remission 03/24/2009   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   Osteoarthritis 07/15/2008   Venous (peripheral) insufficiency 12/11/2007   Hyperlipidemia 07/31/2007   Factor V Leiden (HCC) 07/31/2007   Allergic rhinitis 07/31/2007   History of venous thrombosis and embolism 07/31/2007   History of colonic polyps 07/31/2007    PCP: Tana Conch MD  REFERRING PROVIDER: Tana Conch MD (PCP)   REFERRING  DIAG: Bilateral Knee Pain   THERAPY DIAG:  Muscle weakness (generalized)  Stiffness of right knee, not elsewhere classified  Rationale for Evaluation and Treatment: rehabilitation   ONSET DATE: >1 year ago  SUBJECTIVE:   SUBJECTIVE STATEMENT: Pt is having toruble with his hands, Right side in particular.    HISTORYOF PRESENT ILLNESS: Rodney Scott is a 72yoM referred to [facility] for knee pain. Pt reports more concern with recent memory changes and pin/stiffness/paresthesia in BUE Rt>Lt over past 6 months. Pt reports hurting his Right knee in the 1990s, but its been mostly fine. Reports recent  difficulty rising from floor at work and rising from a chair after >30 minutes sitting.   PAIN:  Are you having pain? No  PRECAUTIONS: None    WEIGHT BEARING RESTRICTIONS: No  FALLS:  Has patient fallen in last 6 months? 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   PLOF: Independent  PATIENT GOALS: Help with his hands  NEXT MD VISIT: None  OBJECTIVE:   PATIENT SURVEYS:  None, pt denies any frank disability   COGNITION: Overall cognitive status: WNL; pt verbalizes multiple times more concern for recent memory changes than for any "knee related issues"     SENSATION: WFL BLE; reports s6 months of sensory changes in BUE fingertips  PALPATION: Infrapatella fat pad effusion bilat, asymptomatic (can sometimes indicate hip extension dominance over knee extension)   LOWER EXTREMITY ROM:  Passive ROM Right eval Left eval  Hip flexion 115 138  Hip extension >0 >0  Hip abduction deferred deferred  Hip internal rotation 18 22  Hip external rotation 50 34  Knee flexion 133 134  Knee extension 0 0    LOWER EXTREMITY MMT:  MMT Right eval Left eval  Hip flexion 5/5 5/5  Hip extension (single leg press) deferred deferred  Hip horizontal abduction 5/5 5/5  Hip ABDCT (supine)  4+/5 4/5  Hip horizontal adduction 5/5 5/5  Hip internal rotation 5/5 5/5  Hip external rotation 5/5 5/5  Knee flexion 5/5 5/5  Knee extension (LAQ @ 10lb x 15) Feels good Feel good   (Blank rows = not tested)  5xSTS: 13.63sec hands free    TODAY'S TREATMENT:                                                                                                                              DATE: 07/29/23   -STS from chair x10, hands free (HEP addition)  -forward steps ups using 2nd step, BUE railings for balance only, 1x10 bilat   PATIENT EDUCATION:  Education details: finds most indicative of early hip arthritic changes, however knees appear WNL  for ROM and strength. Recommend focal HEP practice at home, doubt any regular PT sessions needed to obtain desired outcomes. Will focus on obtaining an OT referral for CC Rt hand stiffness, paresthesia, edema.   HOME EXERCISE PROGRAM: Access Code: [email or web address] URL: [email or web address] Date: 07/29/2023  Prepared by: Alvera Novel  Exercises - Sit to Stand with Hands on Knees  - 3 x weekly - 3 sets - 10 reps - Forward Step Up  - 3 x weekly - 3 sets - 10 reps  ASSESSMENT:  CLINICAL IMPRESSION: Rodney Scott is a 72yoM who is referred to [facility] from PCP for "bilat knee pain". Pt largely denies such a complaint, but does endorse some recent difficulty with rising from standard sitting heights after prolonged sitting and difficulty rising from the floor when in a tall kneeing to half kneeling position at work. Examination revealing of signs and symptoms of early arthritic changes in bilat hip joints. Pt educated on this and provided with 2 exercises for performance at home, Thereasa Parkin emphasized compliance as pt likely does not need recurrent and regular PT in a clinical setting at this point, anticipate dramatic improvement in strength and function at work with regular HEP compliance. Will ask for a referral for OT evaluation for Rt hand which is patient's biggest area of concern. Will plan on 1 month FU here and likely DC from PT at that time pending reassessment/pt's interest.   OBJECTIVE IMPAIRMENTS: Abnormal gait, decreased activity tolerance, decreased balance, decreased cognition, decreased knowledge of condition, decreased knowledge of use of DME, decreased mobility, difficulty walking, decreased ROM, decreased strength, impaired flexibility, and impaired sensation.   ACTIVITY LIMITATIONS: carrying, lifting, bending, sitting, standing, squatting, transfers, and bed mobility  PARTICIPATION LIMITATIONS: cleaning, medication management, occupation, and yard work  PERSONAL FACTORS: Age, Behavior  pattern, Fitness, Past/current experiences, Profession, and Social background are also affecting patient's functional outcome.   REHAB POTENTIAL: Excellent  CLINICAL DECISION MAKING: Stable/uncomplicated  EVALUATION COMPLEXITY: Low   GOALS: Goals reviewed with patient? Yes  SHORT TERM GOALS: Target date: 08/29/23 Pt to reports regular compliance with 2 HEP activities issued at evaluation visit.  Baseline: does not exercise  Goal status: INITIAL  2.  Pt to report improved perception of ease in rising from the floor from work related tasks that require tall kneeling or half kneeling.  Baseline: difficulty with half kneeling lunge to standing Goal status: INITIAL  3.  Pt to demonstrate improved 5xSTS >2 sec Baseline: 07/29/23: 13.17sec hands free Goal status: INITIAL   PLAN:  PT FREQUENCY: 1x/month  PT DURATION: 2 months  PLANNED INTERVENTIONS: 97110-Therapeutic exercises, 97530- Therapeutic activity, 97112- Neuromuscular re-education, 97535- Self Care, 16109- Manual therapy, 97116- Gait training, 97014- Electrical stimulation (unattended), and 97032- Electrical stimulation (manual)  PLAN FOR NEXT SESSION: Reassess ST goals of care, DC from PT services unless pt otherwise desires.    Ferlin Fairhurst C, PT 07/29/2023, 1:13 PM  2:17 PM, 07/29/23 Rosamaria Lints, PT, DPT Physical Therapist - [place] [phone] (Office)

## 2023-08-22 ENCOUNTER — Ambulatory Visit (INDEPENDENT_AMBULATORY_CARE_PROVIDER_SITE_OTHER): Payer: BC Managed Care – PPO | Admitting: Family Medicine

## 2023-08-22 ENCOUNTER — Encounter: Payer: Self-pay | Admitting: Family Medicine

## 2023-08-22 VITALS — BP 120/80 | HR 85 | Temp 97.7°F | Ht 75.0 in | Wt 230.4 lb

## 2023-08-22 DIAGNOSIS — G5601 Carpal tunnel syndrome, right upper limb: Secondary | ICD-10-CM

## 2023-08-22 DIAGNOSIS — F17200 Nicotine dependence, unspecified, uncomplicated: Secondary | ICD-10-CM

## 2023-08-22 DIAGNOSIS — I10 Essential (primary) hypertension: Secondary | ICD-10-CM | POA: Diagnosis not present

## 2023-08-22 DIAGNOSIS — F3342 Major depressive disorder, recurrent, in full remission: Secondary | ICD-10-CM | POA: Diagnosis not present

## 2023-08-22 NOTE — Patient Instructions (Addendum)
We have placed a referral for you today to [facility]. In some cases you will see # listed below- you can call this if you have not heard within a week. If you do not see # listed- you should receive a mychart message or phone call within a week with the # to call directly- call that as soon as you get it. If you are having issues getting scheduled reach out to [place] again.    Otherwise continue current medications - glad Wellbutrin has helped  Recommended follow up: Return for next already scheduled visit or sooner if needed.

## 2023-08-22 NOTE — Progress Notes (Signed)
Phone [phone] In person visit   Subjective:   Rodney Scott is a 72 y.o. year old very pleasant male patient who presents for/with See problem oriented charting Chief Complaint  Patient presents with   medication f/u   Past Medical History-  Patient Active Problem List   Diagnosis Date Noted   MCI (mild cognitive impairment) 06/15/2015    Priority: High   Current smoker 07/26/2014    Priority: High   Factor V Leiden (HCC) 07/31/2007    Priority: High   History of venous thrombosis and embolism 07/31/2007    Priority: High   BPH associated with nocturia 03/10/2018    Priority: Medium    Hyperglycemia 12/05/2016    Priority: Medium    Essential hypertension, benign 07/26/2014    Priority: Medium    Major depressive disorder in full remission 03/24/2009    Priority: Medium    Hyperlipidemia 07/31/2007    Priority: Medium    Senile purpura 09/22/2020    Priority: Low   Left inguinal hernia 12/05/2016    Priority: Low   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    Priority: Low   Osteoarthritis 07/15/2008    Priority: Low   Venous (peripheral) insufficiency 12/11/2007    Priority: Low   Allergic rhinitis 07/31/2007    Priority: Low   History of colonic polyps 07/31/2007    Priority: Low   Major depressive disorder, recurrent episode, moderate (HCC) 05/13/2023   Chronic anticoagulation 10/18/2022   Family history of colon cancer in father 10/18/2022    Medications- reviewed and updated Current Outpatient Medications  Medication Sig Dispense Refill   buPROPion (WELLBUTRIN XL) 150 MG 24 hr tablet Take 1 tablet (150 mg total) by mouth daily. 30 tablet 5   desvenlafaxine (PRISTIQ) 50 MG 24 hr tablet Take 1 tablet (50 mg total) by mouth daily. 90 tablet 3   donepezil (ARICEPT) 10 MG tablet Take 1 tablet (10 mg total) by mouth at bedtime. 90 tablet 3   EPINEPHrine (EPIPEN 2-PAK) 0.3 mg/0.3 mL IJ SOAJ injection Inject 0.3 mg into the muscle as needed for  anaphylaxis. 1 each 0   memantine (NAMENDA) 10 MG tablet Take 1 tablet  twice a day 60 tablet 11   Multiple Vitamin (MULTIVITAMIN) capsule Take by mouth.     rivaroxaban (XARELTO) 20 MG TABS tablet Take 1 tablet (20 mg total) by mouth daily. 90 tablet 3   rosuvastatin (CRESTOR) 40 MG tablet Take 1 tablet by mouth once daily 90 tablet 0   triamcinolone cream (KENALOG) 0.1 % Apply 1 Application topically 2 (two) times daily. 30 g 0   No current facility-administered medications for this visit.     Objective:  BP 120/80   Pulse 85   Temp 97.7 F (36.5 C)   Ht 6\' 3"  (1.905 m)   Wt 230 lb 6.4 oz (104.5 kg)   SpO2 94%   BMI 28.80 kg/m  Gen: NAD, resting comfortably CV: RRR no murmurs rubs or gallops Lungs: CTAB no crackles, wheeze, rhonchi Ext: trace edema Skin: warm, dry     Assessment and Plan   #hypertension S: medication: None-has been able to come off of medication A/P: doing well without medications- continue to monitor    # Depression S: Medication:Wellbutrin 150 mg extended release, Pristiq 50 mg A/P: some improvement on Wellbutrin again- phq9 down to 2 and rates as not difficult at all with GAD7 of 0 - doing well- continue current medications    # Hyperglycemia/insulin resistance/prediabetes-peak  A1c 6.0- offered a1c today but wants to wait until visit in April for full labs  #Right hand suspected carpal tunnel - ongoing issues with numbness/tingling including at night and when wakes up in the morning in first 4 fingers. We were going to try cock up wrist splint but he has not been using regularly so not sure if it has helped but reports has used a few nights and still having issues -also having issues in the daytime with buttoning his shirt with decreased cessation  - wife has relationship with [facility] and we will refer. He is not ideal for NSAIDs with long term xarelto so we want their opinion on steroid injectoin   #smoking 3/4 ppd- encouraged cessatoin- he  declines   Recommended follow up: Return for next already scheduled visit or sooner if needed. Future Appointments  Date Time Provider Department Center  08/28/2023  8:00 AM Seymour Bars, PT [facility] None  09/19/2023  8:30 AM Rosann Auerbach, PhD [facility] None  09/19/2023  9:30 AM [facility]- NEUROPSYCH TECH [facility] None  09/26/2023 10:00 AM Rosann Auerbach, PhD [facility] None  12/11/2023  7:45 AM [facility] ANNUAL WELLNESS VISIT 1 [facility]  12/19/2023  9:00 AM Marcos Eke, PA-C [facility] None  01/23/2024  8:00 AM Durene Cal Aldine Contes, MD [facility]    Lab/Order associations:   ICD-10-CM   1. Essential hypertension, benign  I10     2. Recurrent major depressive disorder, in full remission (HCC)  F33.42     3. Right carpal tunnel syndrome  G56.01 Ambulatory referral to Orthopedic Surgery    4. Current smoker  F17.200      No orders of the defined types were placed in this encounter.  Return precautions advised.  Tana Conch, MD

## 2023-08-24 ENCOUNTER — Other Ambulatory Visit: Payer: Self-pay | Admitting: Family Medicine

## 2023-08-26 DIAGNOSIS — M79641 Pain in right hand: Secondary | ICD-10-CM | POA: Diagnosis not present

## 2023-08-26 DIAGNOSIS — M79642 Pain in left hand: Secondary | ICD-10-CM | POA: Diagnosis not present

## 2023-08-28 ENCOUNTER — Ambulatory Visit (HOSPITAL_COMMUNITY): Payer: BC Managed Care – PPO | Attending: Family Medicine

## 2023-08-28 DIAGNOSIS — M6281 Muscle weakness (generalized): Secondary | ICD-10-CM | POA: Insufficient documentation

## 2023-08-28 DIAGNOSIS — M25661 Stiffness of right knee, not elsewhere classified: Secondary | ICD-10-CM | POA: Diagnosis not present

## 2023-08-28 NOTE — Therapy (Signed)
OUTPATIENT PHYSICAL THERAPY EVALUATION   Patient Name: Rodney Scott MRN: [id number] DOB:[birth year], 72 y.o., male Today's Date: 08/28/2023  PHYSICAL THERAPY DISCHARGE SUMMARY  Visits from Start of Care: 2  Current functional level related to goals / functional outcomes: N/a   Remaining deficits: N/a   Education / Equipment: N/S   Patient agrees to discharge. Patient goals were met. Patient is being discharged due to the patient's request.    END OF SESSION:  PT End of Session - 08/28/23 0813     Visit Number 2    Number of Visits 12    Date for PT Re-Evaluation 09/09/23    Authorization Type [employer] [facility]; [place] Medicare HMO    Authorization Time Period 07/29/23-09/09/23    Progress Note Due on Visit 10    PT Start Time 0811    PT Stop Time 0827    PT Time Calculation (min) 16 min    Equipment Utilized During Treatment Gait belt    Activity Tolerance Patient tolerated treatment well;No increased pain    Behavior During Therapy [facility] for tasks assessed/performed             Past Medical History:  Diagnosis Date   Allergic rhinitis 07/31/2007   Loratadine at times- but mild mental fog on this    BPH associated with nocturia 03/10/2018   Clotting disorder    Current smoker 07/26/2014   Has quit several several times and restarted. Most recently quit 11/06/14. Need to keep a close eye.    Essential hypertension, benign 07/26/2014   Benazepril 20mg     Factor V Leiden 07/31/2007   Xarelto 20mg  daily. 2 DVT 2001, PE 2011.     History of colonic polyps 07/31/2007   02/2015 adenoma. 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 Patient states thought was external.      Left inguinal hernia 12/05/2016   Declines surgery referral- minimally bothering him. Can call in if he changes his mind   Major depressive  disorder in full remission 03/24/2009   Wellbutrin xl 300mg -->150mg --> off 03/2020  pristiq 50mg .   In past-->remeron 15mg  mainly takes prn for sleep zoloft --> pristiq per sister who is psychiatrist apparetnly   MCI (mild cognitive impairment) 06/15/2015   Osteoarthritis    Right rotator cuff tear 12/07/2010   S/p surgery.     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 COmpression stocking on left. L >R swelling-history of 1st DVT in L.      Past Surgical History:  Procedure Laterality Date   COLONOSCOPY     right rotator cuff repair     SALIVARY STONE REMOVAL     Patient Active Problem List   Diagnosis Date Noted   Major depressive disorder, recurrent episode, moderate (HCC) 05/13/2023   Chronic anticoagulation 10/18/2022   Family history of colon cancer in father 10/18/2022   Senile purpura 09/22/2020   BPH associated with nocturia 03/10/2018   Hyperglycemia 12/05/2016   Left inguinal hernia 12/05/2016   MCI (mild cognitive impairment) 06/15/2015   Essential hypertension, benign 07/26/2014   Current smoker 07/26/2014   Major depressive disorder in full remission 03/24/2009   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   Osteoarthritis 07/15/2008  Venous (peripheral) insufficiency 12/11/2007   Hyperlipidemia 07/31/2007   Factor V Leiden (HCC) 07/31/2007   Allergic rhinitis 07/31/2007   History of venous thrombosis and embolism 07/31/2007   History of colonic polyps 07/31/2007    PCP: Tana Conch MD  REFERRING PROVIDER: Tana Conch MD (PCP)   REFERRING DIAG: Bilateral Knee Pain   THERAPY DIAG:  Muscle weakness (generalized)  Stiffness of right knee, not elsewhere classified  Rationale for Evaluation and Treatment: rehabilitation   ONSET DATE: >1 year ago  SUBJECTIVE:   SUBJECTIVE STATEMENT: Patient is having no pain in the  knees today; states his "knees are fine" Wants to to return to MD for right carpal tunnel referral OT. Patient states he does not feel like he needs PT for the knees    HISTORYOF PRESENT ILLNESS: [name] is a 72yoM referred to [facility] for knee pain. Pt reports more concern with recent memory changes and pin/stiffness/paresthesia in BUE Rt>Lt over past 6 months. Pt reports hurting his Right knee in the 1990s, but its been mostly fine. Reports recent difficulty rising from floor at work and rising from a chair after >30 minutes sitting.   PAIN:  Are you having pain? No  PRECAUTIONS: None    WEIGHT BEARING RESTRICTIONS: No  FALLS:  Has patient fallen in last 6 months? 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   PLOF: Independent  PATIENT GOALS: Help with his hands  NEXT MD VISIT: None  OBJECTIVE:   PATIENT SURVEYS:  None, pt denies any frank disability   COGNITION: Overall cognitive status: WNL; pt verbalizes multiple times more concern for recent memory changes than for any "knee related issues"     SENSATION: WFL BLE; reports s6 months of sensory changes in BUE fingertips  PALPATION: Infrapatella fat pad effusion bilat, asymptomatic (can sometimes indicate hip extension dominance over knee extension)   LOWER EXTREMITY ROM:  Passive ROM Right eval Left eval  Hip flexion 115 138  Hip extension >0 >0  Hip abduction deferred deferred  Hip internal rotation 18 22  Hip external rotation 50 34  Knee flexion 133 134  Knee extension 0 0    LOWER EXTREMITY MMT:  MMT Right eval Left eval  Hip flexion 5/5 5/5  Hip extension (single leg press) deferred deferred  Hip horizontal abduction 5/5 5/5  Hip ABDCT (supine)  4+/5 4/5  Hip horizontal adduction 5/5 5/5  Hip internal rotation 5/5 5/5  Hip external rotation 5/5 5/5  Knee flexion 5/5 5/5  Knee extension (LAQ @ 10lb x 15) Feels good Feel good   (Blank rows = not  tested)  5xSTS: 13.63sec hands free    TODAY'S TREATMENT:                                                                                                                              DATE:   08/28/23 PT Discharge  STS from chair x10, hands free (HEP addition)  -forward steps ups using 2nd step, BUE railings for balance only, 1x10 bilat   PATIENT EDUCATION:  Education details: finds most indicative of early hip arthritic changes, however knees appear WNL for ROM and strength. Recommend focal HEP practice at home, doubt any regular PT sessions needed to obtain desired outcomes. Will focus on obtaining an OT referral for CC Rt hand stiffness, paresthesia, edema.   HOME EXERCISE PROGRAM: Access Code: [email or web address] URL: [email or web address] Date: 08/28/2023 Prepared by: Seymour Bars  Exercises - Supine Hamstring Stretch with Strap  - 3 x weekly - 10 reps - 10 seconds hold - Small Range Straight Leg Raise  - 10 reps - Sit to Stand with Hands on Knees  - 3 x weekly - 3 sets - 10 reps - Forward Step Up  - 3 x weekly - 3 sets - 10 reps - Standing Hip Abduction with Counter Support  - 3 x weekly - 3 sets - 10 reps  ASSESSMENT:  CLINICAL IMPRESSION:  PT spoke to patient regarding knee pain; patient is happy with progress and does not want to continue PT. Patient's concern is right wrist; PT recommended f/u with MD for an OT referral. Patient discharged to HEP    IE: Raiford Noble is a 72yoM who is referred to [facility] from PCP for "bilat knee pain". Pt largely denies such a complaint, but does endorse some recent difficulty with rising from standard sitting heights after prolonged sitting and difficulty rising from the floor when in a tall kneeing to half kneeling position at work. Examination revealing of signs and symptoms of early arthritic changes in bilat hip joints. Pt educated on this and provided with 2 exercises for performance at home, Thereasa Parkin emphasized compliance as pt likely  does not need recurrent and regular PT in a clinical setting at this point, anticipate dramatic improvement in strength and function at work with regular HEP compliance. Will ask for a referral for OT evaluation for Rt hand which is patient's biggest area of concern. Will plan on 1 month FU here and likely DC from PT at that time pending reassessment/pt's interest.   OBJECTIVE IMPAIRMENTS: Abnormal gait, decreased activity tolerance, decreased balance, decreased cognition, decreased knowledge of condition, decreased knowledge of use of DME, decreased mobility, difficulty walking, decreased ROM, decreased strength, impaired flexibility, and impaired sensation.   ACTIVITY LIMITATIONS: carrying, lifting, bending, sitting, standing, squatting, transfers, and bed mobility  PARTICIPATION LIMITATIONS: cleaning, medication management, occupation, and yard work  PERSONAL FACTORS: Age, Behavior pattern, Fitness, Past/current experiences, Profession, and Social background are also affecting patient's functional outcome.   REHAB POTENTIAL: Excellent  CLINICAL DECISION MAKING: Stable/uncomplicated  EVALUATION COMPLEXITY: Low   GOALS: Goals reviewed with patient? Yes  SHORT TERM GOALS: Target date: 08/29/23 Pt to reports regular compliance with 2 HEP activities issued at evaluation visit.  Baseline: does not exercise  Goal status: MET  2.  Pt to report improved perception of ease in rising from the floor from work related tasks that require tall kneeling or half kneeling.  Baseline: difficulty with half kneeling lunge to standing Goal status: MET  3.  Pt to demonstrate improved 5xSTS >2 sec Baseline: 07/29/23: 13.17sec hands free Goal status: PARTIALLY MET    PLAN:  PT FREQUENCY: 1x/month  PT DURATION: 2 months  PLANNED INTERVENTIONS: 97110-Therapeutic exercises, 97530- Therapeutic activity, 97112- Neuromuscular re-education, 97535- Self Care, 10960- Manual therapy, [id number]- Gait training,  97014- Electrical stimulation (unattended), and [id number]- Electrical stimulation (manual)  PLAN FOR NEXT SESSION: discharge    Seymour Bars, PT 08/28/2023, 8:33 AM  8:33 AM,

## 2023-09-02 ENCOUNTER — Encounter: Payer: Self-pay | Admitting: Neurology

## 2023-09-02 ENCOUNTER — Other Ambulatory Visit: Payer: Self-pay

## 2023-09-02 DIAGNOSIS — R202 Paresthesia of skin: Secondary | ICD-10-CM

## 2023-09-17 ENCOUNTER — Encounter: Payer: Self-pay | Admitting: Psychology

## 2023-09-19 ENCOUNTER — Ambulatory Visit: Payer: BC Managed Care – PPO

## 2023-09-19 ENCOUNTER — Encounter: Payer: Self-pay | Admitting: Psychology

## 2023-09-19 ENCOUNTER — Ambulatory Visit (INDEPENDENT_AMBULATORY_CARE_PROVIDER_SITE_OTHER): Payer: BC Managed Care – PPO | Admitting: Psychology

## 2023-09-19 DIAGNOSIS — G309 Alzheimer's disease, unspecified: Secondary | ICD-10-CM | POA: Diagnosis not present

## 2023-09-19 DIAGNOSIS — G3184 Mild cognitive impairment, so stated: Secondary | ICD-10-CM

## 2023-09-19 DIAGNOSIS — R4189 Other symptoms and signs involving cognitive functions and awareness: Secondary | ICD-10-CM

## 2023-09-19 HISTORY — DX: Mild cognitive impairment of uncertain or unknown etiology: G31.84

## 2023-09-19 NOTE — Progress Notes (Signed)
NEUROPSYCHOLOGICAL EVALUATION [place]. [facility] [place] Department of Neurology  Date of Evaluation: September 19, 2023  Reason for Referral:   Rodney Scott is a 72 y.o. right-handed Caucasian male referred by Marlowe Kays, PA-C, to characterize his current cognitive functioning and assist with diagnostic clarity and treatment planning in the context of a prior mild neurocognitive disorder diagnosis, concerns for an underlying neurodegenerative illness, and concerns for progressive cognitive decline.   Assessment and Plan:   Clinical Impression(s): [name] pattern of performance is suggestive of severe impairment surrounding both delayed retrieval and recognition/consolidation aspects of memory. Additional impairments were exhibited across confrontation naming and writing samples, while performance variability was exhibited across executive functioning and encoding (i.e., learning) aspects of memory. Performances were appropriate relative to age-matched peers across processing speed, attention/concentration, safety/judgment, receptive language, verbal fluency, and visuospatial abilities. Functionally, [name] continues to work and is independent with medication management. Individuals at work have observed memory decline and he and his wife reported increased navigational concerns while driving (but no noted safety concerns). Diagnostically, he is on the border between mild and major neurocognitive disorder designations. 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 However, he has certainly progressed into the severe end of this spectrum and risks transition to a dementia designation with any further functional decline.  Relative to previous evaluations in April 2022 and August 2023, a steady decline was observed year over year surrounding confrontation naming and  semantic fluency. While more subtle, year over year decline was also exhibited across memory, largely surrounding retention rates and discriminability. Outside of these domains, [name] has exhibited relative stability over time.   Regarding the cause of ongoing cognitive impairment and progressive decline, I continue to have primary concerns surrounding underlying Alzheimer's disease. Despite demonstrating the ability to initially learn some novel verbal information, [name] was fully amnestic (i.e., 0% retention) across all verbal tasks after a brief delay. He also performed very poorly across yes/no recognition trials. Taken together, this suggests rapid forgetting and a prominent storage impairment, both of which are the hallmark testing characteristics of this illness. Year over year decline surrounding memory retention/discriminability, confrontation naming, and semantic fluency would represent a relatively classic Alzheimer's disease profile. Current testing does align more strongly with this condition relative to evaluations in prior years. As such, this illness represents the most likely culprit for ongoing dysfunction at the present time.   I continue to not see compelling evidence for a primary progressive aphasia (PPA) as previously theorized by another clinician back in 2021. There have been concerns for agrammatism across writing samples in 2022 (e.g., "Mom mad" or "Grill hot") and 2023 (e.g., "Dad pointing down" or "Dog biting toy"). Current writing samples (e.g., "Dad is pointing down" or "Mom looking at boy feeding dog"), while still poor overall, continue a trend of improving content rather than progressive worsening, which would be unexpected in a PPA presentation. He also does not display behavioral characteristics of Lewy body disease, another more rare parkinsonian presentation, or the behavioral variant of a frontotemporal lobar degeneration presentation. Continued medical  monitoring will be important moving forward.   Recommendations: [name] has already been prescribed medication aimed to address memory loss and concerns surrounding Alzheimer's disease (i.e., donepezil/Aricept and memantine/Namenda). He is encouraged to continue taking these medications as prescribed. It is important to highlight that these medications have been shown to slow functional  decline in some individuals. There is no current treatment which can stop or reverse cognitive decline when caused by a neurodegenerative illness.   Performance across neurocognitive testing is not a strong predictor of an individual's safety operating a motor vehicle. Should his family wish to pursue a formalized driving evaluation, they could reach out to the following agencies: [employer] in [place]: [phone] [facility]: [phone] [facility]: [phone] [facility]: [phone] or [phone]  Should there be progression of current deficits over time, Rodney Scott is unlikely to regain any independent living skills lost. Therefore, it is recommended that he remain as involved as possible in all aspects of household chores, finances, and medication management, with supervision to ensure adequate performance. He will likely benefit from the establishment and maintenance of a routine in order to maximize his functional abilities over time.  It will be important for Rodney Scott to have another person with him when in situations where he may need to process information, weigh the pros and cons of different options, and make decisions, in order to ensure that he fully understands and recalls all information to be considered.  If not already done, Rodney Scott and his family may want to discuss his wishes regarding durable power of attorney and medical decision making, so that he can have input into these choices. If they require legal assistance with  this, long-term care resource access, or other aspects of estate planning, they could reach out to [employer] at [phone] for a free consultation. Additionally, they may wish to discuss future plans for caretaking and seek out community options for in home/residential care should they become necessary.  Mr. Marsack is encouraged to attend to lifestyle factors for brain health (e.g., regular physical exercise, good nutrition habits and consideration of the MIND-DASH diet, regular participation in cognitively-stimulating activities, and general stress management techniques), which are likely to have benefits for both emotional adjustment and cognition. Optimal control of vascular risk factors (including safe cardiovascular exercise and adherence to dietary recommendations) is encouraged. Continued participation in activities which provide mental stimulation and social interaction is also recommended.   Important information should be provided to Mr. Marbach in written format in all instances. This information should be placed in a highly frequented and easily visible location within his home to promote recall. External strategies such as written notes in a consistently used memory journal, visual and nonverbal auditory cues such as a calendar on the refrigerator or appointments with alarm, such as on a cell phone, can also help maximize recall.  To address problems with fluctuating attention and/or executive dysfunction, he may wish to consider:   -Avoiding external distractions when needing to concentrate   -Limiting exposure to fast paced environments with multiple sensory demands   -Writing down complicated information and using checklists   -Attempting and completing one task at a time (i.e., no multi-tasking)   -Verbalizing aloud each step of a task to maintain focus   -Taking frequent breaks during the completion of steps/tasks to avoid fatigue   -Reducing the amount of information  considered at one time   -Scheduling more difficult activities for a time of day where he is usually most alert  Review of Records:   Mr. Ederer completed a comprehensive neuropsychological evaluation Crista Luria, Ph.D.) on 10/26/2018. Results suggested select executive dysfunction mildly affecting memory performances. His mildly frontal-subcortical pattern was said to potentially relate to various cerebrovascular risk factors (hypertension, hyperlipidemia, historical DVT). Deficits were also  said to potentially be influenced by alcohol abuse as Dr. Julieta Gutting reported a remote history of Mr. Lukes consuming 3-4 beers per day, stopping sometime in 2019. Continued monitoring was recommended, along with repeat testing in the future.    Mr. Cloutier completed a comprehensive neuropsychological evaluation Clayborn Heron, Psy.D.) on 01/03/2020. Results suggested changes with respect to memory affecting learning and retention of both verbal and visual information. This pattern was said to be similar to difficulties that Mr. Nunnelley demonstrated during a prior evaluation in 2020, albeit with possibly greater visual memory difficulties. He did retain some information across time and thus was not demonstrating a complete memory storage issue. Diminished processing speed was also noticed. With respect to language, he did not have any abnormal test scores despite extended testing, although his performance was slightly lower from a raw score perspective on animal fluency, which may be significant given that one would expect practice effects. He performed well on all measures of executive functioning and screened negative for the presence of depression. Overall, Dr. Roseanne Reno found Mr. Tine's qualitative presentation concerning for a developing primary progressive aphasia condition although at that time, he was not demonstrating any frank language impairments. He was ultimately diagnosed with a mild cognitive  impairment. Repeat testing was recommended.    Mr. Blakeman was seen by [facility] Patrcia Dolly, M.D.) on 12/28/2020 for follow-up. He presented for an earlier appointment due to ongoing symptoms of left facial pain. He started noticing left facial pain between his nostril and left ear/cheek in January 2022. He was started on gabapentin which initially did not seem to help much; however, with an increase in dose to 200mg  TID, his pain improved. He reported observing a dull ache which can sometimes wake him up in the middle of the night. However, symptoms are mostly noticed when he is at home sitting or laying down. There were said to be no clear triggers and he denied any tenderness to palpation or sensitivity to light touch. He also denied jaw pain when chewing. His wife denied any bruxism but stated that he moves his mouth sometimes and feels like he is eating Rice Crispies. Memory was said to be overall stable. However, his wife did report more "jumps" in memory loss where he would seemingly worsen and then stabilize. He is on donepezil 10mg  daily without side effects. ADLs were described as generally intact and no sleep/mood concerns were noted. Ultimately, Mr. Bazurto was referred for a repeat neuropsychological evaluation to characterize his cognitive abilities and to assist with diagnostic clarity and treatment planning.    He completed a comprehensive neuropsychological evaluation with myself on 01/18/2021. Results suggested a primary impairment surrounding all aspects of learning and memory. Writing samples also exhibited impairment with evidence for agrammatism (e.g., "Mom mad" or "Grill hot"). However, all other receptive and expressive language assessments were appropriate. A relative weakness was also exhibited across a computerized task assessing hypothesis testing and adaptability; however, performances across other tasks assessing executive functions were appropriate. Performance was also  appropriate across processing speed, attention/concentration, and visuospatial abilities. Relative to his previous evaluation, memory performances were generally stable as primary impairments in verbal and visual memory were also seen across his March 2021 evaluation. Very mild declines were seen across discriminability of a previously learned list of words, as well as delayed recall of aspects of daily living (e.g., medication instructions; name, address, and phone number). An additional performance decline was exhibited across the computerized card sorting task described above. Language assessments  were generally stable (writing samples were not assessed during previous evaluations); however, improvements were seen across semantic fluency and confrontation naming tasks. Mild improvements were also seen across attention/concentration. Processing speed and visuospatial abilities were stable. 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 Repeat testing was recommended.    He met with [facility] Marlowe Kays, [place]) on 11/09/2021 for follow-up. He has been tolerating Aricept well and symptoms were largely said to be stable.   He completed a comprehensive neuropsychological evaluation with myself on 05/23/2022. Results suggested prominent impairment surrounding all aspects of verbal memory, as well as recognition/consolidation aspects of visual memory. Writing samples also exhibited impairment with evidence for agrammatism (e.g., "Dad pointing down" or "Dog biting toy"). However, other tasks assessing receptive and expressive language remained normatively appropriate. A relative weakness was exhibited across executive functioning, while performance variability was exhibited across processing speed. Performances were appropriate relative to age-matched peers across attention/concentration,  visuospatial abilities and encoding/retrieval aspects of visual memory. Relative to his previous evaluation in April 2022, mild performance declines were exhibited across processing speed, executive functioning, and verbal memory. Declines were also seen across complex attention, confrontation naming, and semantic fluency; however, these generally approached performances during his March 2021 evaluation and still remained adequate from a normative perspective. Recognition/consolidation aspects of visual memory remained impaired. All other assessed domains exhibited stability. As stated in previous reports, Alzheimer's disease remained a concern. Despite some evidence to suggest adequate encoding (i.e., learning), retention rates were essentially amnestic across verbal tasks. He also performed poorly across all recognition tasks, even a visual task where retention rates were 75%. This suggested evidence for rapid forgetting and an evolving and already quite significant memory storage impairment, both of which are the hallmark characteristics of this illness. The fact that declines had been quite mild over time was encouraging and would suggest a slowed disease process if truly present.  He most recently met with Ms. Wertman on 06/13/2023 for follow-up. His wife reported concern surrounding progressive cognitive decline, especially surrounding short-term memory. ADL functioning was generally stable. Performance on a brief cognitive screening instrument (MMSE) was 27/30. Ultimately, Mr. Gholar was referred for a repeat neuropsychological evaluation to characterize his cognitive abilities and to assist with diagnostic clarity and treatment planning.   Neuroimaging  Brain MRI on 10/19/2018 revealed mild chronic microvascular ischemic changes. Cerebral volume was said to be normal. Brain MRI on 08/17/2022 revealed mild generalized cerebral atrophy and mild but slightly progressive microvascular ischemic  disease.  Past Medical History:  Diagnosis Date   Allergic rhinitis 07/31/2007   Loratadine at times- but mild mental fog on this    BPH associated with nocturia 03/10/2018   Chronic anticoagulation 10/18/2022   Clotting disorder    Current smoker 07/26/2014   Essential hypertension, benign 07/26/2014   Benazepril 20mg     Factor V Leiden 07/31/2007   Xarelto 20mg  daily. 2 DVT 2001, PE 2011.      History of colonic polyps 07/31/2007   02/2015 adenoma. 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 Patient states thought was external.      Left inguinal hernia 12/05/2016   Declines surgery referral- minimally bothering him. Can call in if he changes his mind  Major depressive disorder in full remission 03/24/2009   Wellbutrin xl 300mg -->150mg --> off 03/2020  pristiq 50mg .   In past-->remeron 15mg  mainly takes prn for sleep zoloft --> pristiq per sister who is psychiatrist apparetnly   MCI (mild cognitive impairment) 06/15/2015   Osteoarthritis    Right rotator cuff tear 12/07/2010   S/p surgery.     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 COmpression stocking on left. L >R swelling-history of 1st DVT in L.       Past Surgical History:  Procedure Laterality Date   COLONOSCOPY     right rotator cuff repair     SALIVARY STONE REMOVAL      Current Outpatient Medications:    buPROPion (WELLBUTRIN XL) 150 MG 24 hr tablet, Take 1 tablet (150 mg total) by mouth daily., Disp: 30 tablet, Rfl: 5   desvenlafaxine (PRISTIQ) 50 MG 24 hr tablet, Take 1 tablet (50 mg total) by mouth daily., Disp: 90 tablet, Rfl: 3   donepezil (ARICEPT) 10 MG tablet, Take 1 tablet (10 mg total) by mouth at bedtime., Disp: 90 tablet, Rfl:  3   EPINEPHrine (EPIPEN 2-PAK) 0.3 mg/0.3 mL IJ SOAJ injection, Inject 0.3 mg into the muscle as needed for anaphylaxis., Disp: 1 each, Rfl: 0   memantine (NAMENDA) 10 MG tablet, Take 1 tablet  twice a day, Disp: 60 tablet, Rfl: 11   Multiple Vitamin (MULTIVITAMIN) capsule, Take by mouth., Disp: , Rfl:    rivaroxaban (XARELTO) 20 MG TABS tablet, Take 1 tablet (20 mg total) by mouth daily., Disp: 90 tablet, Rfl: 3   rosuvastatin (CRESTOR) 40 MG tablet, Take 1 tablet by mouth once daily, Disp: 90 tablet, Rfl: 0   triamcinolone cream (KENALOG) 0.1 %, Apply 1 Application topically 2 (two) times daily., Disp: 30 g, Rfl: 0  Clinical Interview:   The following information was obtained during a clinical interview with Mr. Neudecker prior to cognitive testing.  Cognitive Symptoms: Decreased short-term memory: Endorsed. He previously described primary difficulties surrounding him recalling the details of previous conversations, as well as names of familiar individuals. His wife previously added that he has had a propensity to misplace objects (including putting items in the wrong location when emptying the dishwasher). She also noted that he had forgotten to turn off the stove a few times when making breakfast. Memory dysfunction was said to have been present since prior to his 2020 evaluation. Per his wife, this has progressively worsened over time, including the time since his most recent August 2023 evaluation. She specifically noted increased repetition in day-to-day conversation over that time. Decreased long-term memory: Denied. Decreased attention/concentration: Endorsed. He described difficulties with sustained attention, increased distractibility, and frequently losing his train of thought. His wife was in agreement, noting her perception of gradual decline over time. Reduced processing speed: Endorsed. Difficulties with executive functions: Endorsed. He previously reported a longstanding weakness  with mild disorganization. His wife previously noted that he seems more indecisive and has trouble with planning and remembering upcoming appointments. Impulsivity was denied. His wife also reported some personality changes in that Mr. Avis appears more quiet and disengaged, especially around family. This has continued. However, his wife also currently added some increased periods of argumentativeness within the past year.  Difficulties with emotion regulation: Denied. Difficulties with receptive language: Denied. Difficulties with word finding: Endorsed. His wife previously noted that he will use  filler words (i.e., "you know, you know") often while speaking due to experiencing word finding deficits. This was said to have progressively worsened since his previous evaluation.  Decreased visuoperceptual ability: Denied.   Difficulties completing ADLs: Somewhat. He continues to manage medications independently. He reported no issues with this. His wife commented that she was unable to verify his medication accuracy. Financially, he acknowledged some past instances where he has forgotten about bills which needed to be paid. His wife had described this occurring during his previous evaluation. They have been working on a method of paying bills immediately rather than putting them aside until the due date gets closer as he described a history of procrastination. He and his wife denied driving-related safety concerns. However, they both acknowledged increased navigation difficulties and that Mr. Steinhardt has become far more reliant on his GPS lately.   Additional Medical History: History of traumatic brain injury/concussion: Denied. History of stroke: Denied. History of seizure activity: Denied. History of known exposure to toxins: Denied. Symptoms of chronic pain: Denied. Experience of frequent headaches/migraines: Denied. He previously acknowledged a remote history of headaches but stated that these  have become infrequent experiences since starting gabapentin.  Frequent instances of dizziness/vertigo: Denied.   Sensory changes: He has a history of hearing loss and will utilize hearing aids, especially for higher frequencies. He also reported a longstanding diminished sense of both taste and smell. The cause for this was unknown. Balance/coordination difficulties: Denied. He also denied any recent falls.  Other motor difficulties: Denied.  Sleep History: Estimated hours obtained each night: 10-12 hours per his wife.  Difficulties falling asleep: Denied. Difficulties staying asleep: Denied outside of occasionally waking to use the restroom.  Feels rested and refreshed upon awakening: Endorsed "for the most part."    History of snoring: Unclear.  History of waking up gasping for air: Denied. Witnessed breath cessation while asleep: Denied.   History of vivid dreaming: Endorsed. Excessive movement while asleep: Denied. Instances of acting out his dreams: Denied.  Psychiatric/Behavioral Health History: Depression: He has a remote history of depressive symptoms in remission and reportedly took an anti-depressant medication at that time. No current medication intervention was reported. He described his current mood as "all right" and denied acute depressive symptoms. Current or remote suicidal ideation, intent, or plan was denied.  Anxiety: Denied. Mania: Denied. Trauma History: Denied. Visual/auditory hallucinations: Denied. Delusional thoughts: Denied.   Tobacco: Endorsed. He reported consuming one pack of cigarettes per day.  Alcohol: He reported infrequent alcohol consumption currently. Medical records do suggest a history of potential alcohol abuse where he was consuming 3-4 beers per night. Alcohol use was reportedly diminished in 2019.  Recreational drugs: Denied.  Family History: Problem Relation Age of Onset   Memory loss Mother    Colon polyps Father    Colon cancer  Father 41   Factor V Leiden deficiency Son    Esophageal cancer Neg Hx    Rectal cancer Neg Hx    Stomach cancer Neg Hx    This information was confirmed by Mr. Silberman.  Academic/Vocational History: Highest level of educational attainment: 12 years. He graduated high school and described himself as an average (C) student in academic settings. However, performances were somewhat subject dependant. For example, social studies represented a strength while math was a relative weakness. He reported engaging in summer school courses in middle school due to academic performance but could not recall what subject(s) this was for.  History of developmental delay: Denied. History  of grade repetition: Denied. Enrollment in special education courses: Denied. History of LD/ADHD: Denied.   Employment: He continues to work in Production designer, theatre/television/film for [employer]. 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 He also acknowledged some trouble understanding instructions and navigating to and from job sites.  Evaluation Results:   Behavioral Observations: Mr. Olczak was accompanied by his wife, arrived to his appointment on time, and was appropriately dressed and groomed. He appeared alert. Observed gait and station were within normal limits. Gross motor functioning appeared intact upon informal observation and no abnormal movements (e.g., tremors) were noted. His affect was generally relaxed and positive. Spontaneous speech was fluent and word finding difficulties were not observed during the clinical interview. Thought processes were coherent, organized, and normal in content. Insight into his cognitive difficulties appeared adequate. However, he likely does not have full appreciation for the extent of progressive cognitive  decline over the years.   During testing, sustained attention was appropriate. Task engagement was adequate and he persisted when challenged. Overall, Mr. Sanda was cooperative with the clinical interview and subsequent testing procedures.   Adequacy of Effort: The validity of neuropsychological testing is limited by the extent to which the individual being tested may be assumed to have exerted adequate effort during testing. Mr. Yazell expressed his intention to perform to the best of his abilities and exhibited adequate task engagement and persistence. Scores across stand-alone and embedded performance validity measures were within expectation. As such, the results of the current evaluation are believed to be a valid representation of Mr. Sonnen's current cognitive functioning.  Test Results: Mr. Helling was fully oriented at the time of the current evaluation.  Intellectual abilities based upon educational and vocational attainment were estimated to be in the average range. Premorbid abilities were estimated to be within the average range based upon a single-word reading test.   Processing speed was below average to average. Basic attention was above average. More complex attention (e.g., working memory) was average. Executive functioning was variable, ranging from the exceptionally low to average normative ranges. He performed in the above average range across a task assessing safety and judgment.  Assessed receptive language abilities were above average. Likewise, Mr. Laxson did not exhibit any difficulties comprehending task instructions and answered all questions asked of him appropriately. Assessed expressive language was variable. Phonemic fluency was above average to well above average, semantic fluency was below average, confrontation naming was well below average, and writing samples were well below average. Writing samples were actually improved with regard to agrammatism  concerns as expressed across previous evaluations.   Assessed visuospatial/visuoconstructional abilities were average.    Learning (i.e., encoding) of novel verbal and visual information was variable, ranging from the exceptionally low to average normative ranges. Spontaneous delayed recall (i.e., retrieval) of previously learned information was exceptionally low to well below average. Retention rates were 0% across a story learning task, 0% across a list learning task, 0% across a daily living task, and 50% (raw score of 1) across a shape learning task. Performance across recognition tasks was exceptionally low to well below average, suggesting negligible evidence for information consolidation.   Results of emotional screening instruments suggested that recent symptoms of generalized anxiety were in the minimal range, while symptoms of depression were within normal limits. A screening instrument assessing recent sleep quality suggested the  presence of minimal sleep dysfunction.  Tables of Scores:   Note: This summary of test scores accompanies the interpretive report and should not be considered in isolation without reference to the appropriate sections in the text. Descriptors are based on appropriate normative data and may be adjusted based on clinical judgment. Terms such as "Within Normal Limits" and "Outside Normal Limits" are used when a more specific description of the test score cannot be determined. Descriptors refer to the current evaluation only.         Percentile - Normative Descriptor > 98 - Exceptionally High 91-97 - Well Above Average 75-90 - Above Average 25-74 - Average 9-24 - Below Average 2-8 - Well Below Average < 2 - Exceptionally Low         Validity: April 2022 August 2023 Current  DESCRIPTOR         DCT: --- --- --- --- Within Normal Limits  NAB EVI: --- --- --- --- Within Normal Limits         Orientation:        Raw Score Raw Score Raw Score Percentile   NAB  Orientation, Form 1 29/29 26/29 29/29  --- ---         Cognitive Screening:        Raw Score Raw Score Raw Score Percentile   SLUMS: 26/30 21/30 19/30  --- ---         Intellectual Functioning:        Standard Score Standard Score Standard Score Percentile   Test of Premorbid Functioning: 99 90 94 34 Average         Memory:       NAB Memory Module, Form 1: Standard Score/ T Score Standard Score/ T Score Standard Score/ T Score Percentile   Total Memory Index 65 71 65 1 Exceptionally Low  List Learning         Total Trials 1-3 15/36 (41) 15/36 (42) 16/36 (44) 27 Average    List B 4/12 (53) 0/12 (26) 3/12 (47) 38 Average    Short Delay Free Recall 0/12 (24) 2/12 (33) 1/12 (30) 2 Well Below Average    Long Delay Free Recall 0/12 (30) 0/12 (32) 0/12 (32) 4 Well Below Average    Retention Percentage 0 (<19) 0 (22) 0 (22) <1 Exceptionally Low    Recognition Discriminability 3 (37) 2 (36) -1 (27) 1 Exceptionally Low  Shape Learning         Total Trials 1-3 3/27 (19) 10/27 (40) 5/27 (26) 1 Exceptionally Low    Delayed Recall 3/9 (39) 3/9 (40) 1/9 (26) 1 Exceptionally Low    Retention Percentage 150 (67) 75 (43) 50 (36) 8 Well Below Average    Recognition Discriminability 3 (32) 3 (31) 3 (31) 3 Well Below Average  Story Learning         Immediate Recall 36/80 (35) 41/80 (39) 28/80 (33) 5 Well Below Average    Delayed Recall 0/40 (33) 0/40 (34) 0/40 (34) 5 Well Below Average    Retention Percentage 0 (<19) 0 (<19) 0 (<19) <1 Exceptionally Low  Daily Living Memory         Immediate Recall 33/51 (41) 31/51 (41) 35/51 (44) 27 Average    Delayed Recall 5/17 (26) 3/17 (21) 0/17 (19) <1 Exceptionally Low    Retention Percentage 33 (<19) 25 (<19) 0 (<19) <1 Exceptionally Low    Recognition Hits 4/10 (<19) 3/10 (<19) 5/10 (20) <1 Exceptionally Low  Attention/Executive Function:       Trail Making Test (TMT): Raw Score (T Score) Raw Score (T Score) Raw Score (T Score) Percentile     Part  A 40 secs.,  0 errors (44) 37 secs.,  0 errors (50) 58 secs.,  0 errors (37) 9 Below Average    Part B 118 secs.,  1 error (44) 143 secs.,  1 error (42) 106 secs.,  0 errors (46) 34 Average           Scaled Score Scaled Score Scaled Score Percentile   WAIS-IV Coding: 9 8 9  37 Average         NAB Attention Module, Form 1: T Score T Score T Score Percentile     Digits Forward 54 55 59 82 Above Average    Digits Backwards 46 39 46 34 Average         D-KEFS Color-Word Interference Test: Raw Score (Scaled Score) Raw Score (Scaled Score) Raw Score (Scaled Score) Percentile     Color Naming 39 secs. (7) 44 secs. (6) 41 secs. (7) 16 Below Average    Word Reading 28 secs. (8) 37 secs. (4) 30 secs. (8) 25 Average    Inhibition 86 secs. (7) 105 secs. (5) 106 secs. (5) 5 Well Below Average      Total Errors 1 error (11) 0 errors (13) 5 errors (8) 25 Average    Inhibition/Switching 101 secs. (7) 123 secs. (4) 155 secs. (1) <1 Exceptionally Low      Total Errors 6 errors (7) 6 errors (7) 8 errors (5) 5 Well Below Average         D-KEFS Verbal Fluency Test: Raw Score (Scaled Score) Raw Score (Scaled Score) Raw Score (Scaled Score) Percentile     Letter Total Correct --- --- 47 (14) 91 Well Above Average    Category Total Correct --- --- 26 (7) 16 Below Average    Category Switching Total Correct --- --- 5 (2) <1 Exceptionally Low    Category Switching Accuracy --- --- 2 (1) <1 Exceptionally Low      Total Set Loss Errors --- --- 2 (10) 50 Average      Total Repetition Errors --- --- 8 (5) 5 Well Below Average         NAB Executive Functions Module, Form 1: T Score T Score T Score Percentile     Judgment --- 41 59 82 Above Average         Language:       Verbal Fluency Test: Raw Score (T Score) Raw Score (T Score) Raw Score (T Score) Percentile     Phonemic Fluency (FAS) 35 (48) 42 (56) 47 (59) 82 Above Average    Animal Fluency 18 (60) 16 (47) 12 (38) 12 Below Average          NAB  Language Module, Form 1: T Score T Score T Score Percentile     Oral Production 45 53 41 18 Below Average    Reading Comprehension 13/13 13/13 13/13  --- Within Normal Limits    Auditory Comprehension 58 58 58 79 Above Average    Naming 31/31 (57) 28/31 (41) 25/31 (32) 4 Well Below Average    Writing 24 34 34 5 Well Below Average         Visuospatial/Visuoconstruction:        Raw Score Raw Score Raw Score Percentile   Clock Drawing: 10/10 8/10 9/10 --- Within Normal Limits  NAB Spatial Module, Form 1: T Score T Score T Score Percentile     Figure Drawing Copy 45 42 51 54 Average          Scaled Score Scaled Score Scaled Score Percentile   WAIS-IV Block Design: --- 14 9 37 Average         Mood and Personality:        Raw Score Raw Score Raw Score Percentile   Geriatric Depression Scale: 8 7 7  --- Within Normal Limits  Geriatric Anxiety Scale: 1 11 3  --- Minimal    Somatic 0 2 0 --- Minimal    Cognitive 0 5 1 --- Minimal    Affective 1 4 2  --- Minimal         Additional Questionnaires:        Raw Score Raw Score Raw Score Percentile   PROMIS Sleep Disturbance Questionnaire: 13 14 13  --- None to Slight    Informed Consent and Coding/Compliance:   The current evaluation represents a clinical evaluation for the purposes previously outlined by the referral source and is in no way reflective of a forensic evaluation.   Mr. Kempel was provided with a verbal description of the nature and purpose of the present neuropsychological evaluation. Also reviewed were the foreseeable risks and/or discomforts and benefits of the procedure, limits of confidentiality, and mandatory reporting requirements of this provider. The patient was given the opportunity to ask questions and receive answers about the evaluation. Oral consent to participate was provided by the patient.   This evaluation was conducted by Newman Nickels, Ph.D., ABPP-CN, board certified clinical neuropsychologist. Mr.  Waffle completed a clinical interview with Dr. Milbert Coulter, billed as one unit [phone], and 125 minutes of cognitive testing and scoring, billed as one unit [phone] and three additional units [id number]. Psychometrist Shan Levans, B.S. assisted Dr. Milbert Coulter with test administration and scoring procedures. As a separate and discrete service, one unit [id number] and two units [phone] were billed for Dr. Tammy Sours time spent in interpretation and report writing.

## 2023-09-19 NOTE — Progress Notes (Signed)
   Psychometrician Note   Cognitive testing was administered to Rodney Scott by Shan Levans, B.S. (psychometrist) under the supervision of Rodney Scott, Ph.D., licensed psychologist on 09/19/2023. Rodney Scott did not appear overtly distressed by the testing session per behavioral observation or responses across self-report questionnaires. Rest breaks were offered.    The battery of tests administered was selected by Rodney Scott, Ph.D. with consideration to Rodney Scott's current level of functioning, the nature of his symptoms, emotional and behavioral responses during interview, level of literacy, observed level of motivation/effort, and the nature of the referral question. This battery was communicated to the psychometrist. Communication between Rodney Scott, Ph.D. and the psychometrist was ongoing throughout the evaluation and Rodney Scott, Ph.D. was immediately accessible at all times. Rodney Scott, Ph.D. provided supervision to the psychometrist on the date of this service to the extent necessary to assure the quality of all services provided.    Rodney Scott will return within approximately 1-2 weeks for an interactive feedback session with Rodney Scott at which time his test performances, clinical impressions, and treatment recommendations will be reviewed in detail. Rodney Scott understands he can contact our office should he require our assistance before this time.  A total of 125 minutes of billable time were spent face-to-face with Rodney Scott by the psychometrist. This includes both test administration and scoring time. Billing for these services is reflected in the clinical report generated by Rodney Scott, Ph.D.  This note reflects time spent with the psychometrician and does not include test scores or any clinical interpretations made by Rodney Scott. The full report will follow in a separate note.

## 2023-09-26 ENCOUNTER — Ambulatory Visit (INDEPENDENT_AMBULATORY_CARE_PROVIDER_SITE_OTHER): Payer: BC Managed Care – PPO | Admitting: Psychology

## 2023-09-26 DIAGNOSIS — G3184 Mild cognitive impairment, so stated: Secondary | ICD-10-CM

## 2023-09-26 DIAGNOSIS — G309 Alzheimer's disease, unspecified: Secondary | ICD-10-CM | POA: Diagnosis not present

## 2023-09-26 NOTE — Progress Notes (Signed)
   Neuropsychology Feedback Session Rodney Scott. [facility] [place] Department of Neurology  Reason for Referral:   Rodney Scott is a 72 y.o. right-handed Caucasian male referred by Marlowe Kays, PA-C, to characterize his current cognitive functioning and assist with diagnostic clarity and treatment planning in the context of a prior mild neurocognitive disorder diagnosis, concerns for an underlying neurodegenerative illness, and concerns for progressive cognitive decline.   Feedback:   [name] completed a comprehensive neuropsychological evaluation on 09/19/2023. Please refer to that encounter for the full report and recommendations. Briefly, results suggested severe impairment surrounding both delayed retrieval and recognition/consolidation aspects of memory. Additional impairments were exhibited across confrontation naming and writing samples, while performance variability was exhibited across executive functioning and encoding (i.e., learning) aspects of memory. Relative to previous evaluations in April 2022 and August 2023, a steady decline was observed year over year surrounding confrontation naming and semantic fluency. While more subtle, year over year decline was also exhibited across memory, largely surrounding retention rates and discriminability. Outside of these domains, [name] has exhibited relative stability over time.  Regarding the cause of ongoing cognitive impairment and progressive decline, I continue to have primary concerns surrounding underlying Alzheimer's disease. Despite demonstrating the ability to initially learn some novel verbal information, [name] was fully amnestic (i.e., 0% retention) across all verbal tasks after a brief delay. He also performed very poorly across yes/no recognition trials. Taken together, this suggests rapid forgetting and a prominent storage impairment, both of which are the hallmark testing characteristics of this illness.  Year over year decline surrounding memory retention/discriminability, confrontation naming, and semantic fluency would represent a relatively classic Alzheimer's disease profile. Current testing does align more strongly with this condition relative to evaluations in prior years. As such, this illness represents the most likely culprit for ongoing dysfunction at the present time.   [name] was unaccompanied during the current feedback session. Content of the current session focused on the results of his neuropsychological evaluation. [name] was given the opportunity to ask questions and his questions were answered. He was encouraged to reach out should additional questions arise. A copy of his report was provided at the conclusion of the visit.      One unit [phone] was billed for Dr. Tammy Sours time spent preparing for, conducting, and documenting the current feedback session with [name].

## 2023-09-29 ENCOUNTER — Ambulatory Visit (INDEPENDENT_AMBULATORY_CARE_PROVIDER_SITE_OTHER): Payer: BC Managed Care – PPO | Admitting: Neurology

## 2023-09-29 DIAGNOSIS — R202 Paresthesia of skin: Secondary | ICD-10-CM

## 2023-09-29 DIAGNOSIS — G5621 Lesion of ulnar nerve, right upper limb: Secondary | ICD-10-CM | POA: Diagnosis not present

## 2023-09-29 DIAGNOSIS — G5601 Carpal tunnel syndrome, right upper limb: Secondary | ICD-10-CM

## 2023-09-29 NOTE — Procedures (Signed)
[facility]  [address] [place], [address]  [place], [place] [address] Tel: [phone] Fax: [phone] Test Date:  09/29/2023  Patient: Rodney Scott DOB: [birth year] Physician: Jacquelyne Balint, MD  Sex: Male Height: 6\' 3"  Ref Phys: Margart Sickles, PA-C  ID#: [id number]   Technician:    History: This is a 72 year old male with right hand numbness, tingling, and pain.  NCV & EMG Findings: Extensive electrodiagnostic evaluation of the right upper limb shows: Right median and ulnar sensory responses are absent. Right radial sensory response is within normal limits. Right median (APB) motor response shows prolonged distal onset latency (7.7 ms), reduced amplitude (0.70 mV), and decreased conduction velocity (38 m/s, likely due to loss of fastest firing fibers). Right ulnar (ADM) motor response shows reduced amplitude (5.2 mV) and decreased conduction velocity from below elbow to wrist (41 m/s) and from above elbow to below elbow (29 m/s). Chronic motor axon loss changes WITH active denervation changes are seen in the right abductor digiti minimi and abductor pollicis brevis muscles. Chronic motor axon loss changes WITHOUT active denervation changes are seen in the right first dorsal interosseous and flexor digitorum profundus to digits 4,5.  Impression: This is an abnormal study. 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 Evidence of a right ulnar mononeuropathy proximal to the take off to the flexor digitorum profundus to digits 4,5, perhaps at the cubital tunnel or elbow, severe in degree electrically. No electrodiagnostic evidence of a right cervical (C5-T1) motor radiculopathy.  Of note, left side comparison studies were deferred due to patient discomfort, however, given atrophy of hand on left similar to right, findings would likely  be bilateral. Correlate clinically.    ___________________________ Jacquelyne Balint, MD    Nerve Conduction Studies Motor Nerve Results    Latency Amplitude F-Lat Segment Distance CV Comment  Site (ms) Norm (mV) Norm (ms)  (cm) (m/s) Norm   Right Median (APB) Motor  Wrist *7.7  < 4.0 *0.70  > 5.0        Elbow 17.0 - 0.69 -  Elbow-Wrist 35 *38  > 50   Right Ulnar (ADM) Motor  Wrist 2.3  < 3.1 *5.2  > 7.0        Bel elbow 8.9 - 3.2 -  Bel elbow-Wrist 27 *41  > 50   Ab elbow 12.4 - 2.9 -  Ab elbow-Bel elbow 10 *29 -    Sensory Sites    Neg Peak Lat Amplitude (O-P) Segment Distance Velocity Comment  Site (ms) Norm (V) Norm  (cm) (ms)   Right Median Sensory  Wrist-Dig II *NR  < 3.8 *NR  > 10 Wrist-Dig II 13    Right Radial Sensory  Forearm-Wrist 2.2  < 2.8 15  > 10 Forearm-Wrist 10    Right Ulnar Sensory  Wrist-Dig V *NR  < 3.2 *NR  > 5 Wrist-Dig V 11     Electromyography   Side Muscle Ins.Act Fibs Fasc Recrt Amp Dur Poly Activation Comment  Right FDI Nml Nml Nml *2- *1+ *1+ Nml Nml Atrophy  Right ADM Nml *1+ Nml *3- *1+ *1+ Nml Nml Atrophy  Right EIP Nml Nml Nml Nml Nml Nml Nml Nml N/A  Right APB Nml *1+ Nml *3- Nml *1+ *1+ Nml Atrophy  Right Pronator teres Nml Nml Nml Nml Nml Nml Nml Nml N/A  Right FDP  4,5 Nml Nml Nml *2- *1+ *1+ Nml Nml N/A  Right Biceps Nml Nml Nml Nml Nml Nml Nml Nml N/A  Right Triceps Nml Nml Nml Nml Nml Nml Nml Nml N/A  Right Deltoid Nml Nml Nml Nml Nml Nml Nml Nml N/A      Waveforms:  Motor      Sensory

## 2023-10-03 DIAGNOSIS — M79642 Pain in left hand: Secondary | ICD-10-CM | POA: Diagnosis not present

## 2023-10-03 DIAGNOSIS — M79641 Pain in right hand: Secondary | ICD-10-CM | POA: Diagnosis not present

## 2023-10-06 ENCOUNTER — Telehealth: Payer: Self-pay | Admitting: Family Medicine

## 2023-10-06 NOTE — Telephone Encounter (Signed)
 Received faxed document Surgical Clearance, to be filled out by provider. Patient requested to send it back via Fax Document is located in providers tray at front office.Please advise .

## 2023-10-07 NOTE — Telephone Encounter (Signed)
Last ov 08/22/23, form in your "to sign folder".

## 2023-10-09 NOTE — Telephone Encounter (Signed)
Will have to sign next week

## 2023-10-13 ENCOUNTER — Encounter: Payer: Self-pay | Admitting: Physician Assistant

## 2023-10-13 NOTE — Telephone Encounter (Signed)
Back on your desk Ardine Bjork

## 2023-10-16 NOTE — Telephone Encounter (Signed)
 Forms faxed to surgery center.

## 2023-10-17 ENCOUNTER — Encounter: Payer: Self-pay | Admitting: Family Medicine

## 2023-11-13 DIAGNOSIS — G5601 Carpal tunnel syndrome, right upper limb: Secondary | ICD-10-CM | POA: Diagnosis not present

## 2023-11-23 ENCOUNTER — Other Ambulatory Visit: Payer: Self-pay | Admitting: Family Medicine

## 2023-12-11 ENCOUNTER — Ambulatory Visit (INDEPENDENT_AMBULATORY_CARE_PROVIDER_SITE_OTHER): Payer: Medicare HMO

## 2023-12-11 VITALS — BP 120/80 | HR 78 | Temp 98.0°F | Wt 234.8 lb

## 2023-12-11 DIAGNOSIS — Z0000 Encounter for general adult medical examination without abnormal findings: Secondary | ICD-10-CM

## 2023-12-11 NOTE — Patient Instructions (Signed)
 Mr. Catalfamo , Thank you for taking time to come for your Medicare Wellness Visit. I appreciate your ongoing commitment to your health goals. Please review the following plan we discussed and let me know if I can assist you in the future.   Referrals/Orders/Follow-Ups/Clinician Recommendations: maintain  health and activity  Aim for 30 minutes of exercise or brisk walking, 6-8 glasses of water, and 5 servings of fruits and vegetables each day.   This is a list of the screening recommended for you and due dates:  Health Maintenance  Topic Date Due   Medicare Annual Wellness Visit  11/22/2023   Hepatitis C Screening  11/11/2109*   Screening for Lung Cancer  02/18/2024   Colon Cancer Screening  12/11/2025   Pneumonia Vaccine  Completed   Flu Shot  Completed   COVID-19 Vaccine  Completed   Zoster (Shingles) Vaccine  Completed   HPV Vaccine  Aged Out   DTaP/Tdap/Td vaccine  Discontinued  *Topic was postponed. The date shown is not the original due date.    Advanced directives: (In Chart) A copy of your advanced directives are scanned into your chart should your provider ever need it.  Next Medicare Annual Wellness Visit scheduled for next year: Yes

## 2023-12-11 NOTE — Progress Notes (Addendum)
 Subjective:   Rodney Scott is a 73 y.o. who presents for a Medicare Wellness preventive visit.  Visit Complete: In person    AWV Questionnaire: Yes: Patient Medicare AWV questionnaire was completed by the patient on 12/10/23; I have confirmed that all information answered by patient is correct and no changes since this date.  Cardiac Risk Factors include: advanced age (>16men, >40 women);dyslipidemia;male gender;hypertension     Objective:    Today's Vitals   12/11/23 0802  BP: 120/80  Pulse: 78  Temp: 98 F (36.7 C)  SpO2: 97%  Weight: 234 lb 12.8 oz (106.5 kg)   Body mass index is 29.35 kg/m.     12/11/2023    8:11 AM 06/13/2023    8:55 AM 05/03/2023   10:20 AM 12/06/2022    8:54 AM 11/21/2022    8:09 AM 09/11/2022    7:42 AM 09/10/2022    2:20 PM  Advanced Directives  Does Patient Have a Medical Advance Directive? Yes Yes No Yes Yes No No  Type of Estate agent of [place];Living will Healthcare Power of [place];Living will   Healthcare Power of [place];Living will    Does patient want to make changes to medical advance directive? No - Patient declined    No - Patient declined    Copy of Healthcare Power of Attorney in Chart? Yes - validated most recent copy scanned in chart (See row information)    Yes - validated most recent copy scanned in chart (See row information)      Current Medications (verified) Outpatient Encounter Medications as of 12/11/2023  Medication Sig   buPROPion (WELLBUTRIN XL) 150 MG 24 hr tablet Take 1 tablet (150 mg total) by mouth daily.   desvenlafaxine (PRISTIQ) 50 MG 24 hr tablet Take 1 tablet (50 mg total) by mouth daily.   donepezil (ARICEPT) 10 MG tablet Take 1 tablet (10 mg total) by mouth at bedtime.   EPINEPHrine (EPIPEN 2-PAK) 0.3 mg/0.3 mL IJ SOAJ injection Inject 0.3 mg into the muscle as needed for anaphylaxis.   FLUZONE HIGH-DOSE 0.5 ML injection    memantine (NAMENDA) 10 MG tablet Take 1 tablet  twice a  day   Multiple Vitamin (MULTIVITAMIN) capsule Take by mouth.   rivaroxaban (XARELTO) 20 MG TABS tablet Take 1 tablet (20 mg total) by mouth daily.   rosuvastatin (CRESTOR) 40 MG tablet Take 1 tablet by mouth once daily   triamcinolone cream (KENALOG) 0.1 % Apply 1 Application topically 2 (two) times daily.   No facility-administered encounter medications on file as of 12/11/2023.    Allergies (verified) Yellow jacket venom [bee venom]   History: Past Medical History:  Diagnosis Date   Allergic rhinitis 07/31/2007   Loratadine at times- but mild mental fog on this    BPH associated with nocturia 03/10/2018   Chronic anticoagulation 10/18/2022   Clotting disorder    Current smoker 07/26/2014   Essential hypertension, benign 07/26/2014   Benazepril 20mg     Factor V Leiden 07/31/2007   Xarelto 20mg  daily. 2 DVT 2001, PE 2011.      History of colonic polyps 07/31/2007   02/2015 adenoma. 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 Patient states thought was external.      Left inguinal hernia 12/05/2016   Declines surgery referral- minimally bothering him.  Can call in if he changes his mind   Major depressive disorder in full remission 03/24/2009   Wellbutrin xl 300mg -->150mg --> off 03/2020  pristiq 50mg .   In past-->remeron 15mg  mainly takes prn for sleep zoloft --> pristiq per sister who is psychiatrist apparetnly   Mild cognitive impairment (MCI) due to Alzheimer's disease 09/19/2023   Dr. Karel Jarvis sees him now. On aricept.     Osteoarthritis    Right rotator cuff tear 12/07/2010   S/p surgery.     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 COmpression stocking on left. L >R swelling-history of 1st DVT in L.       Past Surgical History:  Procedure Laterality Date   COLONOSCOPY     right rotator cuff repair     SALIVARY STONE REMOVAL     Family History  Problem Relation Age of Onset   Memory loss Mother    Colon polyps Father    Colon cancer Father 34   Factor V Leiden deficiency Son    Esophageal cancer Neg Hx    Rectal cancer Neg Hx    Stomach cancer Neg Hx    Social History   Socioeconomic History   Marital status: Married    Spouse name: Not on file   Number of children: 2   Years of education: 12   Highest education level: High school graduate  Occupational History   Occupation: Maintenance  Tobacco Use   Smoking status: Every Day    Current packs/day: 1.00    Average packs/day: 1 pack/day for 57.0 years (57.0 ttl pk-yrs)    Types: Cigarettes   Smokeless tobacco: Never   Tobacco comments:    1 ppd  Vaping Use   Vaping status: Never Used  Substance and Sexual Activity   Alcohol use: Yes    Comment: rare alcohol consumption   Drug use: No   Sexual activity: Yes  Other Topics Concern   Not on file  Social History Narrative   Married (wife patient of Dr. Durene Cal). Son and daughter. 1 granddaughter.       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      Hobbies: time around house, time with family, yardwork, some cabinetry      Lives in 2 story home with wife and granddaughter, Brewing technologist school graduate      Right handed    Social Drivers of Health   Financial Resource Strain: Low Risk  (12/11/2023)   Overall Financial Resource Strain (CARDIA)    Difficulty of Paying Living Expenses: Not hard at all  Food Insecurity: No Food Insecurity (12/11/2023)   Hunger Vital Sign    Worried About Running Out of Food in the Last Year: Never true    Ran Out of Food in the Last Year: Never true  Transportation Needs: No Transportation Needs (12/11/2023)   PRAPARE - Doctor, general practice (Medical): No    Lack of Transportation (Non-Medical): No  Physical Activity: Sufficiently Active (12/11/2023)   Exercise Vital Sign    Days of Exercise per Week: 5 days    Minutes of Exercise per Session: 60 min  Stress: No Stress Concern Present (12/11/2023)   [employer] of Occupational Health - Occupational Stress Questionnaire    Feeling of Stress :  Not at all  Social Connections: Moderately Isolated (12/11/2023)   Social Connection and Isolation Panel [NHANES]    Frequency of Communication with Friends and Family: Once a week    Frequency of Social Gatherings with Friends and Family: More than three times a week    Attends Religious Services: Never    Database administrator or Organizations: No    Attends Engineer, structural: Never    Marital Status: Married    Tobacco Counseling Ready to quit: Not Answered Counseling given: Not Answered Tobacco comments: 1 ppd    Clinical Intake:  Pre-visit preparation completed: Yes  Pain : No/denies pain     BMI - recorded: 29.35 Nutritional Status: BMI 25 -29 Overweight Nutritional Risks: None Diabetes: No  How often do you need to have someone help you when you read instructions, pamphlets, or other written materials from your doctor or pharmacy?: 1 - Never  Interpreter Needed?: No  Information entered by :: Lanier Ensign, LPN   Activities of Daily Living     12/10/2023    1:01 PM  In your present state of health, do you have any difficulty performing the following activities:  Hearing? 0   Vision? 0   Difficulty concentrating or making decisions? 1  Comment difficulty remember at times  Walking or climbing stairs? 0   Dressing or bathing? 0   Doing errands, shopping? 1   Preparing Food and eating ? N   Using the Toilet? N   In the past six months, have you accidently leaked urine? N   Do you have problems with loss of bowel control? N   Managing your Medications? N   Managing  your Finances? N   Housekeeping or managing your Housekeeping? N      Proxy-reported    Patient Care Team: Shelva Majestic, MD as PCP - General (Family Medicine) Van Clines, MD as Consulting Physician (Neurology) Dahlia Byes, [facility] as Pharmacist (Pharmacist) Elwyn Reach (Neurology)  Indicate any recent Medical Services you may have received from other than [facility] providers in the past year (date may be approximate).     Assessment:   This is a routine wellness examination for Kerney.  Hearing/Vision screen Hearing Screening - Comments:: Pt wears a hearing aids  Vision Screening - Comments:: Pt follows up with dr Dione Booze for annual eye exams    Goals Addressed             This Visit's Progress    Patient Stated       Patient Stated       Maintain health and activity as best I can        Depression Screen     12/11/2023    8:13 AM 08/22/2023    3:37 PM 05/13/2023    2:26 PM 01/02/2023    8:13 AM 11/21/2022    8:08 AM 09/13/2022    4:05 PM 04/22/2022    4:20 PM  PHQ 2/9 Scores  PHQ - 2 Score 0 1 2 0 0 0 3  PHQ- 9 Score  2 4 0  0 6    Fall Risk     12/10/2023    1:01 PM 06/13/2023    8:55 AM 05/13/2023    2:26 PM 01/02/2023    8:13 AM 12/06/2022    8:54 AM  Fall Risk   Falls in the past year? 0  0 0 0 0  Number falls in past yr:  0  0 0 0  Injury with Fall?  0 0 0 0  Risk for fall due to : No Fall Risks  No Fall Risks No Fall Risks History of fall(s)  Follow up Falls prevention discussed Falls evaluation completed Falls evaluation completed Falls evaluation completed      Proxy-reported    MEDICARE RISK AT HOME:  Medicare Risk at Home Any stairs in or around the home?: (Proxy-Rptd) Yes If so, are there any without handrails?: (Proxy-Rptd) No Home free of loose throw rugs in walkways, pet beds, electrical cords, etc?: (Proxy-Rptd) Yes Adequate lighting in your home to reduce risk of falls?: (Proxy-Rptd) Yes Life alert?: (Proxy-Rptd) No Use of a cane,  walker or w/c?: (Proxy-Rptd) No Grab bars in the bathroom?: (Proxy-Rptd) No Shower chair or bench in shower?: (Proxy-Rptd) No Elevated toilet seat or a handicapped toilet?: (Proxy-Rptd) Yes  TIMED UP AND GO:  Was the test performed?  Yes  Length of time to ambulate 10 feet: 10 sec Gait steady and fast without use of assistive device  Cognitive Function: 6CIT completed    06/13/2023   12:00 PM 12/06/2022   12:00 PM 06/07/2022    9:00 AM 11/09/2021   12:00 PM 09/25/2018    1:22 PM  MMSE - Mini Mental State Exam  Orientation to time 5 5 5 5 5   Orientation to Place 5 5 5 5 5   Registration 3 3 3 3 3   Attention/ Calculation 5 4 5 2 5   Recall 0 0 2 0 3  Language- name 2 objects 2 2 2 2 2   Language- repeat 1 1 1 1 1   Language- follow 3 step command 3 3 3 1 3   Language- read & follow direction 1 1 1 1 1   Write a sentence 1 0 1 1 1   Copy design 1 1 1 1 1   Total score 27 25 29 22 30       04/23/2019    9:00 AM 12/14/2018    2:00 PM 09/25/2018   10:00 AM  Montreal Cognitive Assessment   Visuospatial/ Executive (0/5) 5 4 5   Naming (0/3) 3 3 3   Attention: Read list of digits (0/2) 2 2 2   Attention: Read list of letters (0/1) 0 1 1  Attention: Serial 7 subtraction starting at 100 (0/3) 3 3 3   Language: Repeat phrase (0/2) 2 2 2   Language : Fluency (0/1) 1 1 1   Abstraction (0/2) 2 2 2   Delayed Recall (0/5) 0 0 0  Orientation (0/6) 6 6 6   Total 24 24 25   Adjusted Score (based on education) 25 25       12/11/2023    8:15 AM 11/21/2022    8:12 AM 11/16/2021    8:59 AM 11/10/2020    9:06 AM  6CIT Screen  What Year? 0 points 0 points 0 points 0 points  What month? 0 points 0 points 0 points 0 points  What time? 0 points 0 points 0 points   Count back from 20 0 points 0 points 0 points 0 points  Months in reverse 4 points 0 points 0 points 0 points  Repeat phrase 10 points 0 points 2 points 0 points  Total Score 14 points 0 points 2 points     Immunizations Immunization History   Administered Date(s) Administered   Fluad Quad(high Dose 65+) 07/22/2020, 07/23/2021, 08/01/2021   Hep A / Hep B 06/18/2023   Influenza Split 07/26/2011, 08/07/2012   Influenza Whole 07/31/2007, 07/15/2008, 07/07/2009, 06/29/2010  Influenza, High Dose Seasonal PF 09/08/2017, 07/15/2018, 07/10/2019, 07/26/2023   Influenza,inj,Quad PF,6+ Mos 08/20/2013, 07/26/2014, 06/15/2015, 07/05/2016   Influenza-Unspecified 07/10/2019   Moderna Covid-19 Fall Seasonal Vaccine 3yrs & older 07/29/2023   Moderna Covid-19 Vaccine Bivalent Booster 54yrs & up 07/27/2022   PFIZER(Purple Top)SARS-COV-2 Vaccination 11/03/2019, 11/24/2019, 07/22/2020, 03/03/2021   PNEUMOCOCCAL CONJUGATE-20 03/03/2021   Pneumococcal Conjugate-13 06/05/2017   Pneumococcal Polysaccharide-23 06/22/2010, 07/15/2018   Td 10/14/2001   Tdap 01/03/2012, 06/18/2023   Zoster Recombinant(Shingrix) 07/10/2019, 09/29/2019    Screening Tests Health Maintenance  Topic Date Due   Hepatitis C Screening  11/11/2109 (Originally 01/02/1969)   Lung Cancer Screening  02/18/2024   Medicare Annual Wellness (AWV)  12/10/2024   Colonoscopy  12/11/2025   Pneumonia Vaccine 52+ Years old  Completed   INFLUENZA VACCINE  Completed   COVID-19 Vaccine  Completed   Zoster Vaccines- Shingrix  Completed   HPV VACCINES  Aged Out   DTaP/Tdap/Td  Discontinued    Health Maintenance  There are no preventive care reminders to display for this patient.  Health Maintenance Items Addressed:   Additional Screening:  Vision Screening: Recommended annual ophthalmology exams for early detection of glaucoma and other disorders of the eye.  Dental Screening: Recommended annual dental exams for proper oral hygiene  Community Resource Referral / Chronic Care Management: CRR required this visit?  No   CCM required this visit?  No     Plan:     I have personally reviewed and noted the following in the patient's chart:   Medical and social history Use  of alcohol, tobacco or illicit drugs  Current medications and supplements including opioid prescriptions. Patient is not currently taking opioid prescriptions. Functional ability and status Nutritional status Physical activity Advanced directives List of other physicians Hospitalizations, surgeries, and ER visits in previous 12 months Vitals Screenings to include cognitive, depression, and falls Referrals and appointments  In addition, I have reviewed and discussed with patient certain preventive protocols, quality metrics, and best practice recommendations. A written personalized care plan for preventive services as well as general preventive health recommendations were provided to patient.     Marzella Schlein, LPN   05/08/3663   After Visit Summary: (In Person-Printed) AVS printed and given to the patient  Notes: Pt 6 CIT score this year was 14 , pt stated he has been having lots of memory concerns.  Pt has a physical scheduled January 23, 2024

## 2023-12-15 ENCOUNTER — Encounter: Payer: Self-pay | Admitting: Physician Assistant

## 2023-12-15 ENCOUNTER — Ambulatory Visit: Payer: BC Managed Care – PPO | Admitting: Physician Assistant

## 2023-12-15 VITALS — BP 114/72 | HR 99 | Resp 18 | Ht 75.0 in | Wt 233.0 lb

## 2023-12-15 DIAGNOSIS — G3184 Mild cognitive impairment, so stated: Secondary | ICD-10-CM | POA: Diagnosis not present

## 2023-12-15 DIAGNOSIS — G309 Alzheimer's disease, unspecified: Secondary | ICD-10-CM | POA: Diagnosis not present

## 2023-12-15 NOTE — Patient Instructions (Signed)
 Good to see you.  Continue Donepezil 10mg  daily.  Continue  memantine to  10 mg twice a day  Follow-up in 6 months     FALL PRECAUTIONS: Be cautious when walking. Scan the area for obstacles that may increase the risk of trips and falls. When getting up in the mornings, sit up at the edge of the bed for a few minutes before getting out of bed. Consider elevating the bed at the head end to avoid drop of blood pressure when getting up. Walk always in a well-lit room (use night lights in the walls). Avoid area rugs or power cords from appliances in the middle of the walkways. Use a walker or a cane if necessary and consider physical therapy for balance exercise. Get your eyesight checked regularly.  FINANCIAL OVERSIGHT: Supervision, especially oversight when making financial decisions or transactions is also recommended as difficulties arise.  HOME SAFETY: Consider the safety of the kitchen when operating appliances like stoves, microwave oven, and blender. Consider having supervision and share cooking responsibilities until no longer able to participate in those. Accidents with firearms and other hazards in the house should be identified and addressed as well.  DRIVING: Regarding driving, in patients with progressive memory problems, driving will be impaired. We advise to have someone else do the driving if trouble finding directions or if minor accidents are reported. Independent driving assessment is available to determine safety of driving.  ABILITY TO BE LEFT ALONE: If patient is unable to contact 911 operator, consider using LifeLine, or when the need is there, arrange for someone to stay with patients. Smoking is a fire hazard, consider supervision or cessation. Risk of wandering should be assessed by caregiver and if detected at any point, supervision and safe proof recommendations should be instituted.  MEDICATION SUPERVISION: Inability to self-administer medication needs to be constantly  addressed. Implement a mechanism to ensure safe administration of the medications.  RECOMMENDATIONS FOR ALL PATIENTS WITH MEMORY PROBLEMS: 1. Continue to exercise (Recommend 30 minutes of walking everyday, or 3 hours every week) 2. Increase social interactions - continue going to [place] and enjoy social gatherings with friends and family 3. Eat healthy, avoid fried foods and eat more fruits and vegetables 4. Maintain adequate blood pressure, blood sugar, and blood cholesterol level. Reducing the risk of stroke and cardiovascular disease also helps promoting better memory. 5. Avoid stressful situations. Live a simple life and avoid aggravations. Organize your time and prepare for the next day in anticipation. 6. Sleep well, avoid any interruptions of sleep and avoid any distractions in the bedroom that may interfere with adequate sleep quality 7. Avoid sugar, avoid sweets as there is a strong link between excessive sugar intake, diabetes, and cognitive impairment We discussed the Mediterranean diet, which has been shown to help patients reduce the risk of progressive memory disorders and reduces cardiovascular risk. This includes eating fish, eat fruits and green leafy vegetables, nuts like almonds and hazelnuts, walnuts, and also use olive oil. Avoid fast foods and fried foods as much as possible. Avoid sweets and sugar as sugar use has been linked to worsening of memory function.      Mediterranean Diet  Why follow it? Research shows. Those who follow the Mediterranean diet have a reduced risk of heart disease  The diet is associated with a reduced incidence of Parkinson's and Alzheimer's diseases People following the diet may have longer life expectancies and lower rates of chronic diseases  The Dietary Guidelines for Americans recommends the  Mediterranean diet as an eating plan to promote health and prevent disease  What Is the Mediterranean Diet?  Healthy eating plan based on typical foods  and recipes of Mediterranean-style cooking The diet is primarily a plant based diet; these foods should make up a majority of meals   Starches - Plant based foods should make up a majority of meals - They are an important sources of vitamins, minerals, energy, antioxidants, and fiber - Choose whole grains, foods high in fiber and minimally processed items  - Typical grain sources include wheat, oats, barley, corn, brown rice, bulgar, farro, millet, polenta, couscous  - Various types of beans include chickpeas, lentils, fava beans, black beans, white beans   Fruits  Veggies - Large quantities of antioxidant rich fruits & veggies; 6 or more servings  - Vegetables can be eaten raw or lightly drizzled with oil and cooked  - Vegetables common to the traditional Mediterranean Diet include: artichokes, arugula, beets, broccoli, brussel sprouts, cabbage, carrots, celery, collard greens, cucumbers, eggplant, kale, leeks, lemons, lettuce, mushrooms, okra, onions, peas, peppers, potatoes, pumpkin, radishes, rutabaga, shallots, spinach, sweet potatoes, turnips, zucchini - Fruits common to the Mediterranean Diet include: apples, apricots, avocados, cherries, clementines, dates, figs, grapefruits, grapes, melons, nectarines, oranges, peaches, pears, pomegranates, strawberries, tangerines  Fats - Replace butter and margarine with healthy oils, such as olive oil, canola oil, and tahini  - Limit nuts to no more than a handful a day  - Nuts include walnuts, almonds, pecans, pistachios, pine nuts  - Limit or avoid candied, honey roasted or heavily salted nuts - Olives are central to the [employer] - can be eaten whole or used in a variety of dishes   Meats Protein - Limiting red meat: no more than a few times a month - When eating red meat: choose lean cuts and keep the portion to the size of deck of cards - Eggs: approx. 0 to 4 times a week  - Fish and lean poultry: at least 2 a week  - Healthy protein  sources include, chicken, [place], lean beef, lamb - Increase intake of seafood such as tuna, salmon, trout, mackerel, shrimp, scallops - Avoid or limit high fat processed meats such as sausage and bacon  Dairy - Include moderate amounts of low fat dairy products  - Focus on healthy dairy such as fat free yogurt, skim milk, low or reduced fat cheese - Limit dairy products higher in fat such as whole or 2% milk, cheese, ice cream  Alcohol - Moderate amounts of red wine is ok  - No more than 5 oz daily for women (all ages) and men older than age 30  - No more than 10 oz of wine daily for men younger than 86  Other - Limit sweets and other desserts  - Use herbs and spices instead of salt to flavor foods  - Herbs and spices common to the traditional Mediterranean Diet include: basil, bay leaves, chives, cloves, cumin, fennel, garlic, lavender, marjoram, mint, oregano, parsley, pepper, rosemary, sage, savory, sumac, tarragon, thyme   It's not just a diet, it's a lifestyle:  The Mediterranean diet includes lifestyle factors typical of those in the region  Foods, drinks and meals are best eaten with others and savored Daily physical activity is important for overall good health This could be strenuous exercise like running and aerobics This could also be more leisurely activities such as walking, housework, yard-work, or taking the stairs Moderation is the key; a balanced and  healthy diet accommodates most foods and drinks Consider portion sizes and frequency of consumption of certain foods   Meal Ideas & Options:  Breakfast:  Whole wheat toast or whole wheat English muffins with peanut butter & hard boiled egg Steel cut oats topped with apples & cinnamon and skim milk  Fresh fruit: banana, strawberries, melon, berries, peaches  Smoothies: strawberries, bananas, greek yogurt, peanut butter Low fat greek yogurt with blueberries and granola  Egg white omelet with spinach and mushrooms Breakfast  couscous: whole wheat couscous, apricots, skim milk, cranberries  Sandwiches:  Hummus and grilled vegetables (peppers, zucchini, squash) on whole wheat bread   Grilled chicken on whole wheat pita with lettuce, tomatoes, cucumbers or tzatziki  [place] salad on whole wheat bread: tuna salad made with greek yogurt, olives, red peppers, capers, green onions Garlic rosemary lamb pita: lamb sauted with garlic, rosemary, salt & pepper; add lettuce, cucumber, greek yogurt to pita - flavor with lemon juice and black pepper  Seafood:  Mediterranean grilled salmon, seasoned with garlic, basil, parsley, lemon juice and black pepper Shrimp, lemon, and spinach whole-grain pasta salad made with low fat greek yogurt  Seared scallops with lemon orzo  Seared tuna steaks seasoned salt, pepper, coriander topped with tomato mixture of olives, tomatoes, olive oil, minced garlic, parsley, green onions and cappers  Meats:  Herbed greek chicken salad with kalamata olives, cucumber, feta  Red bell peppers stuffed with spinach, bulgur, lean ground beef (or lentils) & topped with feta   Kebabs: skewers of chicken, tomatoes, onions, zucchini, squash  [place] burgers: made with red onions, mint, dill, lemon juice, feta cheese topped with roasted red peppers Vegetarian Cucumber salad: cucumbers, artichoke hearts, celery, red onion, feta cheese, tossed in olive oil & lemon juice  Hummus and whole grain pita points with a greek salad (lettuce, tomato, feta, olives, cucumbers, red onion) Lentil soup with celery, carrots made with vegetable broth, garlic, salt and pepper  Tabouli salad: parsley, bulgur, mint, scallions, cucumbers, tomato, radishes, lemon juice, olive oil, salt and pepper.

## 2023-12-15 NOTE — Progress Notes (Signed)
 Assessment/Plan:   Memory Impairment   Rodney Scott is a very pleasant 73 y.o. 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  In the past, there were concerns for PPA, but per Neuropsych evaluation on 09/2023 the evidence is now less compelling.  Patient is on memantine 10 mg daily and memantine 10 mg twice daily, tolerating well.  His MMSE today is 28/30, stable from prior. Patient is able to participate on ADLs and to drive without difficulties       Recommendations:   Follow up in  6 months. Continue memantine 10 mg daily and memantine 10 mg twice daily, side effects discussed Recommend good control of cardiovascular risk factors Continue to control mood as per PCP Monitor driving    Subjective:   This patient is accompanied in the office by his wife who supplements the history. Previous records as well as any outside records available were reviewed prior to todays visit.   Patient was last seen on 06/13/2023 with MMSE of 26/30.    Any changes in memory since last visit? " A little worse"-wife says, he reports that there are no significant changes other than having some good and bad days.Marland Kitchen  His wife has to repeat the right several times for the appointment, because he forgets.  She reports that his thoughts at times are random, not connecting to the context of conversation.  Sometimes he cannot recall denies someone that he knew for a long time. "All those things are more pronounced"-wife says He continues to work, denies any issues, has less responsibilities, lighter work".He may make a list by not remember to take it with him to the grocery store repeats oneself?   Endorsed Disoriented when walking into a room?  Patient denies    Misplacing objects?  He is more disorganized than prior, if he empties the dishwasher for example, he may not remember where he left discussed.  Wandering behavior?   denies   Any personality changes since last visit?  As before, he is more argumentative. Any worsening depression?:  He has a history of major depression.  His PCP manages the medications. Hallucinations or paranoia?  denies   Seizures?   denies    Any sleep changes? Sleeps well.  Endorses vivid dreams, denies REM behavior or sleepwalking.    Sleep apnea?   denies   Any hygiene concerns?   Has to wear the same clothes but  he does shower daily. Independent of bathing and dressing?  Denies  Does the patient needs help with medications? Patient is in charge   Who is in charge of the finances?  Wife is in charge, but he pays some of the bills.     Any changes in appetite?  denies    Patient have trouble swallowing?  denies   Does the patient cook?  He makes breakfast and lunch.  Any kitchen accidents such as leaving the stove on?   denies   Any headaches?    denies   Vision changes? denies Chronic pain?  He has chronic arthritic pain. Had carpal tunnel surgery 1 month ago.  Ambulates with difficulty?    Denies.  Recent falls or head injuries?    Denies.      Unilateral weakness,  numbness or tingling?   Denies.   Any tremors?  denies   Any anosmia?  Endorsed, worse for the last 5 years. Also decreased taste Any incontinence of urine?  Denies.   Any bowel dysfunction?  Denies.      Patient lives with his wife  Does the patient drive?  Only locally, uses a GPS.     Neuropsychological Evaluation , Dr. Milbert Coulter 09/2023 . Briefly, results suggested severe impairment surrounding both delayed retrieval and recognition/consolidation aspects of memory. Additional impairments were exhibited across confrontation naming and writing samples, while performance variability was  exhibited across executive functioning and encoding (i.e., learning) aspects of memory. Relative to previous evaluations in April 2022 and August 2023, a steady decline was observed year over year surrounding confrontation naming and semantic fluency. While more subtle, year over year decline was also exhibited across memory, largely surrounding retention rates and differentiability. Outside of these domains, Rodney Scott has exhibited relative stability over time.  Regarding the cause of ongoing cognitive impairment and progressive decline, I continue to have primary concerns surrounding underlying Alzheimer's disease. Despite demonstrating the ability to initially learn some novel verbal information, Rodney Scott was fully amnestic (i.e., 0% retention) across all verbal tasks after a brief delay. He also performed very poorly across yes/no recognition trials. Taken together, this suggests rapid forgetting and a prominent storage impairment, both of which are the hallmark testing characteristics of this illness. Year over year decline surrounding memory retention/discriminability, confrontation naming, and semantic fluency would represent a relatively classic Alzheimer's disease profile. Current testing does align more strongly with this condition relative to evaluations in prior years. As such, this illness represents the most likely culprit for ongoing dysfunction at the present time   History on Initial Assessment 09/25/2018: This is a 73 year old right-handed man with a history of hypertension, hyperlipidemia, Factor V Leiden deficiency, DVT on Xarelto, presenting for evaluation of worsening memory. He and his wife started noticing changes over the past year. He states long-term memory is much better than remembering what he did yesterday. He misplaces things frequently at home, they have lived in the same house for 30 years and his wife has to search for things that have always been in the same place for  years. He denies getting lost driving but uses his GPS a lot, he cannot remember the way to places like before. He procrastinates on bills but generally does not miss bills, his wife nods that he has. He occasionally forgets his medications. He continues to work in [employer] and denies any difficulties performing his job. His wife's major concern is how this has affected caring for their 85 year old granddaughter who has been living with them for the past 5 years. He forgets to feed her, which is new. He goes to a store and forgets what he is doing, she has to give him a pretty detailed note so he can remember events for the day. She has noticed his language skills have decreased markedly, he is not a very verbal person, but she has noticed more difficulty expressing his thoughts or finding words. He may have a thought but stumbles over the words. He repeats himself, one time last summer he told the same story 3-4 times within half an hour. He has always been easily frustrated, but she feels it is worse, such as when driving, "no one knows what they are doing." No paranoia or hallucinations.    He denies any headaches, dizziness, vision changes, neck/back  pain, focal numbness/tingling/weakness, bowel/bladder dysfunction, or tremors. He has lost his sense of smell over the past year for no clear reason. He denies any head injuries. His mother had dementia in her 18s. 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 When asked about mood, he states "I don't know." His wife does not think he is depressed, she is unsure why he is still taking the Wellbutrin and Pristiq, she feels Wellbutrin was started 20 years ago for smoking cessation.      MRI of the brain from November 2023, personally reviewed is remarkable for mild chronic small vessel ischemic changes  slightly progressing from the prior MRI in 2020.  There is mild generalized cerebral atrophy but stable.       Past Medical History:  Diagnosis Date   Allergic rhinitis 07/31/2007   Loratadine at times- but mild mental fog on this    BPH associated with nocturia 03/10/2018   Chronic anticoagulation 10/18/2022   Clotting disorder    Current smoker 07/26/2014   Essential hypertension, benign 07/26/2014   Benazepril 20mg     Factor V Leiden 07/31/2007   Xarelto 20mg  daily. 2 DVT 2001, PE 2011.      History of colonic polyps 07/31/2007   02/2015 adenoma. 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 Patient states thought was external.      Left inguinal hernia 12/05/2016   Declines surgery referral- minimally bothering him. Can call in if he changes his mind   Major depressive disorder in full remission 03/24/2009   Wellbutrin xl 300mg -->150mg --> off 03/2020  pristiq 50mg .   In past-->remeron 15mg  mainly takes prn for sleep zoloft --> pristiq per sister who is psychiatrist apparetnly   Mild cognitive impairment (MCI) due to Alzheimer's disease 09/19/2023   Dr. Karel Jarvis sees him now. On aricept.     Osteoarthritis    Right rotator cuff tear 12/07/2010   S/p surgery.     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 COmpression stocking on left. L >R swelling-history of 1st DVT in L.        Past Surgical History:  Procedure Laterality Date   COLONOSCOPY     right rotator cuff repair     SALIVARY STONE REMOVAL       PREVIOUS MEDICATIONS:   CURRENT MEDICATIONS:  Outpatient Encounter Medications as of 12/15/2023  Medication Sig   buPROPion (WELLBUTRIN XL) 150 MG 24 hr tablet Take 1 tablet (150 mg total) by mouth  daily.   desvenlafaxine (PRISTIQ) 50 MG 24 hr tablet Take 1 tablet (50 mg total) by mouth daily.   donepezil (ARICEPT) 10 MG tablet Take 1 tablet (10 mg total) by mouth at bedtime.   EPINEPHrine (EPIPEN 2-PAK) 0.3 mg/0.3 mL IJ SOAJ injection Inject 0.3 mg into the muscle as needed for anaphylaxis.   FLUZONE HIGH-DOSE 0.5 ML injection    memantine (NAMENDA) 10 MG tablet Take 1 tablet  twice a day   Multiple Vitamin (MULTIVITAMIN) capsule Take by mouth.   rivaroxaban (XARELTO) 20 MG TABS tablet  Take 1 tablet (20 mg total) by mouth daily.   rosuvastatin (CRESTOR) 40 MG tablet Take 1 tablet by mouth once daily   triamcinolone cream (KENALOG) 0.1 % Apply 1 Application topically 2 (two) times daily.   No facility-administered encounter medications on file as of 12/15/2023.     Objective:     PHYSICAL EXAMINATION:    VITALS:   Vitals:   12/15/23 1430  BP: 114/72  Pulse: 99  Resp: 18  SpO2: 98%  Weight: 233 lb (105.7 kg)  Height: 6\' 3"  (1.905 m)    GEN:  The patient appears stated age and is in NAD. HEENT:  Normocephalic, atraumatic.   Neurological examination:  General: NAD, well-groomed, appears stated age. Orientation: The patient is alert. Oriented to person, place and date Cranial nerves: There is good facial symmetry.The speech is fluent and clear, tangential at times. No aphasia or dysarthria. Fund of knowledge is appropriate. Recent memory impaired and remote memory is normal.  Attention and concentration are normal.  Able to name objects and repeat phrases.  Hearing is intact to conversational tone .   Delayed recall 1/3 Sensation: Sensation is intact to light touch throughout Motor: Strength is at least antigravity x4. DTR's 2/4 in UE/LE      04/23/2019    9:00 AM 12/14/2018    2:00 PM 09/25/2018   10:00 AM  Montreal Cognitive Assessment   Visuospatial/ Executive (0/5) 5 4 5   Naming (0/3) 3 3 3   Attention: Read list of digits (0/2) 2 2 2   Attention: Read list of  letters (0/1) 0 1 1  Attention: Serial 7 subtraction starting at 100 (0/3) 3 3 3   Language: Repeat phrase (0/2) 2 2 2   Language : Fluency (0/1) 1 1 1   Abstraction (0/2) 2 2 2   Delayed Recall (0/5) 0 0 0  Orientation (0/6) 6 6 6   Total 24 24 25   Adjusted Score (based on education) 25 25        12/15/2023    4:00 PM 06/13/2023   12:00 PM 12/06/2022   12:00 PM  MMSE - Mini Mental State Exam  Orientation to time 5 5 5   Orientation to Place 5 5 5   Registration 3 3 3   Attention/ Calculation 5 5 4   Recall 1 0 0  Language- name 2 objects 2 2 2   Language- repeat 1 1 1   Language- follow 3 step command 3 3 3   Language- read & follow direction 1 1 1   Write a sentence 1 1 0  Copy design 1 1 1   Total score 28 27 25        Movement examination: Tone: There is normal tone in the UE/LE Abnormal movements:  no tremor.  No myoclonus.  No asterixis.   Coordination:  There is no decremation with RAM's. Normal finger to nose  Gait and Station: The patient has no difficulty arising out of a deep-seated chair without the use of the hands. The patient's stride length is good.  Gait is cautious and narrow.   Thank you for allowing [place] the opportunity to participate in the care of this nice patient. Please do not hesitate to contact us for any questions or concerns.   Total time spent on today's visit was 31 minutes dedicated to this patient today, preparing to see patient, examining the patient, ordering tests and/or medications and counseling the patient, documenting clinical information in the EHR or other health record, independently interpreting results and communicating results to the patient/family, discussing  treatment and goals, answering patient's questions and coordinating care.  Cc:  Shelva Majestic, MD  Marlowe Kays 12/15/2023 4:55 PM

## 2023-12-19 ENCOUNTER — Ambulatory Visit: Payer: BC Managed Care – PPO | Admitting: Physician Assistant

## 2023-12-31 ENCOUNTER — Other Ambulatory Visit: Payer: Self-pay | Admitting: Family Medicine

## 2024-01-15 ENCOUNTER — Encounter: Payer: Self-pay | Admitting: Family Medicine

## 2024-01-23 ENCOUNTER — Encounter: Payer: Self-pay | Admitting: Family Medicine

## 2024-01-23 ENCOUNTER — Ambulatory Visit (INDEPENDENT_AMBULATORY_CARE_PROVIDER_SITE_OTHER): Payer: BC Managed Care – PPO | Admitting: Family Medicine

## 2024-01-23 VITALS — BP 120/80 | HR 69 | Temp 97.3°F | Ht 75.0 in | Wt 229.4 lb

## 2024-01-23 DIAGNOSIS — R739 Hyperglycemia, unspecified: Secondary | ICD-10-CM

## 2024-01-23 DIAGNOSIS — F17200 Nicotine dependence, unspecified, uncomplicated: Secondary | ICD-10-CM | POA: Diagnosis not present

## 2024-01-23 DIAGNOSIS — Z0000 Encounter for general adult medical examination without abnormal findings: Secondary | ICD-10-CM | POA: Diagnosis not present

## 2024-01-23 DIAGNOSIS — G3184 Mild cognitive impairment, so stated: Secondary | ICD-10-CM | POA: Diagnosis not present

## 2024-01-23 DIAGNOSIS — E785 Hyperlipidemia, unspecified: Secondary | ICD-10-CM

## 2024-01-23 DIAGNOSIS — R41840 Attention and concentration deficit: Secondary | ICD-10-CM

## 2024-01-23 DIAGNOSIS — Z125 Encounter for screening for malignant neoplasm of prostate: Secondary | ICD-10-CM

## 2024-01-23 DIAGNOSIS — I10 Essential (primary) hypertension: Secondary | ICD-10-CM

## 2024-01-23 DIAGNOSIS — D6851 Activated protein C resistance: Secondary | ICD-10-CM

## 2024-01-23 DIAGNOSIS — Z131 Encounter for screening for diabetes mellitus: Secondary | ICD-10-CM | POA: Diagnosis not present

## 2024-01-23 DIAGNOSIS — G309 Alzheimer's disease, unspecified: Secondary | ICD-10-CM

## 2024-01-23 LAB — URINALYSIS, ROUTINE W REFLEX MICROSCOPIC
Bilirubin Urine: NEGATIVE
Hgb urine dipstick: NEGATIVE
Ketones, ur: NEGATIVE
Leukocytes,Ua: NEGATIVE
Nitrite: NEGATIVE
Specific Gravity, Urine: 1.015 (ref 1.000–1.030)
Total Protein, Urine: NEGATIVE
Urine Glucose: NEGATIVE
Urobilinogen, UA: 0.2 (ref 0.0–1.0)
pH: 6.5 (ref 5.0–8.0)

## 2024-01-23 LAB — CBC WITH DIFFERENTIAL/PLATELET
Basophils Absolute: 0 10*3/uL (ref 0.0–0.1)
Basophils Relative: 0.6 % (ref 0.0–3.0)
Eosinophils Absolute: 0.2 10*3/uL (ref 0.0–0.7)
Eosinophils Relative: 2.2 % (ref 0.0–5.0)
HCT: 46.7 % (ref 39.0–52.0)
Hemoglobin: 15.9 g/dL (ref 13.0–17.0)
Lymphocytes Relative: 26.6 % (ref 12.0–46.0)
Lymphs Abs: 2.2 10*3/uL (ref 0.7–4.0)
MCHC: 33.9 g/dL (ref 30.0–36.0)
MCV: 98.3 fl (ref 78.0–100.0)
Monocytes Absolute: 0.8 10*3/uL (ref 0.1–1.0)
Monocytes Relative: 9.5 % (ref 3.0–12.0)
Neutro Abs: 5 10*3/uL (ref 1.4–7.7)
Neutrophils Relative %: 61.1 % (ref 43.0–77.0)
Platelets: 195 10*3/uL (ref 150.0–400.0)
RBC: 4.75 Mil/uL (ref 4.22–5.81)
RDW: 13.5 % (ref 11.5–15.5)
WBC: 8.2 10*3/uL (ref 4.0–10.5)

## 2024-01-23 LAB — COMPREHENSIVE METABOLIC PANEL WITH GFR
ALT: 16 U/L (ref 0–53)
AST: 18 U/L (ref 0–37)
Albumin: 4.3 g/dL (ref 3.5–5.2)
Alkaline Phosphatase: 52 U/L (ref 39–117)
BUN: 18 mg/dL (ref 6–23)
CO2: 29 meq/L (ref 19–32)
Calcium: 9.5 mg/dL (ref 8.4–10.5)
Chloride: 105 meq/L (ref 96–112)
Creatinine, Ser: 0.96 mg/dL (ref 0.40–1.50)
GFR: 79.22 mL/min (ref >60.00)
Glucose, Bld: 94 mg/dL (ref 70–99)
Potassium: 4.4 meq/L (ref 3.5–5.1)
Sodium: 141 meq/L (ref 135–145)
Total Bilirubin: 0.4 mg/dL (ref 0.2–1.2)
Total Protein: 6.5 g/dL (ref 6.0–8.3)

## 2024-01-23 LAB — LIPID PANEL
Cholesterol: 141 mg/dL (ref 0–200)
HDL: 47.3 mg/dL (ref >39.00)
LDL Cholesterol: 75 mg/dL (ref 0–99)
NonHDL: 93.33
Total CHOL/HDL Ratio: 3
Triglycerides: 93 mg/dL (ref 0.0–149.0)
VLDL: 18.6 mg/dL (ref 0.0–40.0)

## 2024-01-23 LAB — HEMOGLOBIN A1C: Hgb A1c MFr Bld: 5.7 % (ref 4.6–6.5)

## 2024-01-23 NOTE — Progress Notes (Signed)
 Phone: [phone]   Subjective:  Patient presents today for their annual physical. Chief complaint-noted.   See problem oriented charting- ROS- full  review of systems was completed and negative  except for: numbness in right hand from carpal tunnel improved after surgery  The following were reviewed and entered/updated in epic: Past Medical History:  Diagnosis Date   Allergic rhinitis 07/31/2007   Loratadine at times- but mild mental fog on this    BPH associated with nocturia 03/10/2018   Chronic anticoagulation 10/18/2022   Clotting disorder    Current smoker 07/26/2014   Essential hypertension, benign 07/26/2014   Benazepril 20mg     Factor V Leiden 07/31/2007   Xarelto 20mg  daily. 2 DVT 2001, PE 2011.      History of colonic polyps 07/31/2007   02/2015 adenoma. 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 Patient states thought was external.      Left inguinal hernia 12/05/2016   Declines surgery referral- minimally bothering him. Can call in if he changes his mind   Major depressive disorder in full remission 03/24/2009   Wellbutrin xl 300mg -->150mg --> off 03/2020  pristiq 50mg .   In past-->remeron 15mg  mainly takes prn for sleep zoloft --> pristiq per sister who is psychiatrist apparetnly   Mild cognitive impairment (MCI) due to Alzheimer's disease 09/19/2023   Dr. Karel Jarvis sees him now. On aricept.     Osteoarthritis    Right rotator cuff tear 12/07/2010   S/p surgery.     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 COmpression stocking on left. L >R swelling-history of 1st DVT in L.      Patient Active Problem List   Diagnosis Date Noted   Mild cognitive impairment (MCI) due to Alzheimer's  disease 09/19/2023    Priority: High   Current smoker 07/26/2014    Priority: High   Factor V Leiden 07/31/2007    Priority: High   History of venous thrombosis and embolism 07/31/2007    Priority: High   BPH associated with nocturia 03/10/2018    Priority: Medium    Hyperglycemia 12/05/2016    Priority: Medium    Essential hypertension, benign 07/26/2014    Priority: Medium    Major depressive disorder in full remission 03/24/2009    Priority: Medium    Hyperlipidemia 07/31/2007    Priority: Medium    Senile purpura 09/22/2020    Priority: Low   Left inguinal hernia 12/05/2016    Priority: Low   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    Priority: Low   Osteoarthritis 07/15/2008    Priority: Low   Venous (peripheral) insufficiency 12/11/2007    Priority: Low   Allergic rhinitis 07/31/2007    Priority: Low   History of colonic polyps 07/31/2007    Priority: Low   Chronic anticoagulation 10/18/2022   Past Surgical History:  Procedure Laterality Date   COLONOSCOPY     right rotator cuff repair     SALIVARY STONE REMOVAL      Family History  Problem Relation Age of Onset   Memory loss Mother    Colon polyps Father    Colon cancer Father 56   Factor V Leiden deficiency Son    Esophageal cancer Neg  Hx    Rectal cancer Neg Hx    Stomach cancer Neg Hx     Medications- reviewed and updated Current Outpatient Medications  Medication Sig Dispense Refill   buPROPion (WELLBUTRIN XL) 150 MG 24 hr tablet Take 1 tablet by mouth once daily 30 tablet 0   desvenlafaxine (PRISTIQ) 50 MG 24 hr tablet Take 1 tablet (50 mg total) by mouth daily. 90 tablet 3   donepezil (ARICEPT) 10 MG tablet Take 1 tablet (10 mg total) by mouth at bedtime. 90 tablet 3   EPINEPHrine (EPIPEN 2-PAK) 0.3 mg/0.3 mL IJ SOAJ injection Inject 0.3 mg into the muscle as needed for anaphylaxis. 1 each 0   memantine (NAMENDA) 10 MG tablet Take 1 tablet  twice a day 60 tablet 11   Multiple Vitamin  (MULTIVITAMIN) capsule Take by mouth.     rivaroxaban (XARELTO) 20 MG TABS tablet Take 1 tablet (20 mg total) by mouth daily. 90 tablet 3   rosuvastatin (CRESTOR) 40 MG tablet Take 1 tablet by mouth once daily 90 tablet 0   triamcinolone cream (KENALOG) 0.1 % Apply 1 Application topically 2 (two) times daily. 30 g 0   No current facility-administered medications for this visit.    Allergies-reviewed and updated Allergies  Allergen Reactions   Yellow Jacket Venom [Bee Venom] Shortness Of Breath    Had swelling localized, chest tightness, and shortness of breath    Social History   Social History Narrative   Married (wife patient of Dr. Durene Cal). Son and daughter. 1 granddaughter.       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 Oropharynx normal Neck: no thyromegaly CV: RRR no murmurs rubs or gallops Lungs: CTAB no crackles, wheeze, rhonchi Abdomen: soft/nontender/nondistended/normal bowel sounds. No rebound or guarding.  Ext: no edema Skin: warm, dry Neuro: grossly normal, moves all extremities, PERRLA    Assessment and Plan  73 y.o. male presenting for annual physical.  Health Maintenance counseling: 1. Anticipatory guidance: Patient counseled regarding regular dental exams -q6 months, eye exams -yearly,  avoiding smoking and second hand smoke- see below , limiting alcohol to 2 beverages per day - a beer a week or less, no illicit drugs .   2. Risk factor reduction:  Advised patient of  need for regular exercise and diet rich and fruits and vegetables to reduce risk of heart attack and stroke.  Exercise- active with work- encouraged exercise outside of work.  Diet/weight management-weight up 5 pounds from last year- encouraged reversing trend- but down a few lbs in last few months.  Wt Readings from Last 3 Encounters:  01/23/24 229 lb 6.4 oz (104.1 kg)  12/15/23 233 lb (105.7 kg)  12/11/23 234 lb 12.8 oz (106.5 kg)  3. Immunizations/screenings/ancillary studies-up-to-dateother than holding off on COVID-19 vaccination  Immunization History  Administered Date(s) Administered   Fluad Quad(high Dose 65+)  07/22/2020, 07/23/2021, 08/01/2021   Hep A / Hep B 06/18/2023, 01/10/2024   Influenza Split 07/26/2011, 08/07/2012   Influenza Whole 07/31/2007, 07/15/2008, 07/07/2009, 06/29/2010   Influenza, High Dose Seasonal PF 09/08/2017, 07/15/2018, 07/10/2019, 07/26/2023   Influenza,inj,Quad PF,6+ Mos 08/20/2013, 07/26/2014, 06/15/2015, 07/05/2016   Influenza-Unspecified 07/10/2019   Moderna Covid-19 Fall Seasonal Vaccine 69yrs & older 07/29/2023   Moderna Covid-19 Vaccine Bivalent Booster 77yrs & up 07/27/2022   PFIZER(Purple Top)SARS-COV-2 Vaccination 11/03/2019, 11/24/2019, 07/22/2020, 03/03/2021   PNEUMOCOCCAL CONJUGATE-20 03/03/2021   Pneumococcal Conjugate-13 06/05/2017   Pneumococcal Polysaccharide-23 06/22/2010, 07/15/2018   Td 10/14/2001   Tdap 01/03/2012, 06/18/2023   Zoster Recombinant(Shingrix) 07/10/2019, 09/29/2019  4. Prostate cancer screening-  low risk prior trend- he opts out of furher PSA's as of today Lab Results  Component Value Date   PSA 0.60 01/02/2023   PSA 0.46 06/27/2022   PSA 0.60 04/09/2021   5. Colon cancer screening - colonoscopy with Dr. Russella Dar February 2024 with 3-year repeat due to precancerous polyps 6. Skin cancer screening-used to see Dr. Jorja Loa but declines for now new referral. advised regular sunscreen use. Denies worrisome, changing, or new  skin lesions.  7. Smoking associated screening (lung cancer screening, AAA screen 65-75, UA)-current smoker- smoking three-quarter pack per day-encouraged cessation. Check urinalysis.  Previously referred to lung cancer screening program and completed last May-encouraged to follow-up with pulmonology annually 8. STD screening - only active with wife  Status of chronic or acute concerns   # Carpal tunnel-has seen [facility]-had surgical correction and is feeling better gradually- was hoping for it to be even better   #Mild cognitive impairment/focus ussies S:Medication:   was started on Aricept 10 mg at bedtime and namenda 10 mg BID by Dr. Karel Jarvis and Durenda Age, PA. A/P: ongoing issues. He and his wife have both brought up amphetamines to see if would help with focus- they had mentioned discussed with neurology- I reached to neurology and neuropscyhology and he does not carry formal attention deficit disorder diagnosis and this would be off label use- I offered to refer to psychiatry for their opinion and he opts in - I was clear that this does not guarantee he will be given attention deficit disorder medicines specifically - discussed posisble cardiac risk as well- especially with coronary artery calcfications- hed still like to proceed with at least getting their opinion   # Factor V Leiden S: Medication: Chronic Xarelto 20 mg . No leg swelling or calf pain or shortness of breath  A/P: ongoing chronic issue- continue current medications    #hypertension S: medication: None-has been able to come off of medication A/P: doing well without medications- continue to monitor    #hyperlipidemia #aortic atherosclerosis and coronary artery calcifications S: Medication: rosuvastatin 40 mg daily Lab Results  Component Value Date   CHOL 157 06/14/2022   HDL 46.30 06/14/2022   LDLCALC 84 06/14/2022   LDLDIRECT 86.0 09/13/2019   TRIG 134.0 06/14/2022   CHOLHDL 3 06/14/2022  A/P:  #s close to ideal goal- continue current medications  - did have aortic atherosclerosis  and coronary artery calcium score (presumed stable)- LDL goal ideally <70 - hoping improved- check today -encouraged quitting smoking as well  # Depression S: Medication:Wellbutrin 150 mg extended release, Pristiq 50 mg    01/23/2024    8:13 AM 12/11/2023    8:13 AM 08/22/2023    3:37 PM  Depression screen PHQ 2/9  Decreased Interest 0 0 1  Down, Depressed, Hopeless 1 0  0  PHQ - 2 Score 1 0 1  Altered sleeping 0  0  Tired, decreased energy 0  0  Change in appetite 0  1  Feeling bad or failure about yourself  0  0  Trouble concentrating 0  0  Moving slowly or fidgety/restless 0  0  Suicidal thoughts 0  0  PHQ-9 Score 1  2  Difficult doing work/chores Not difficult at all  Not difficult at all  A/P: full remission- continue current medications    # Hyperglycemia/insulin resistance/prediabetes-peak A1c 6.0 S:  Medication: none  Lab Results  Component Value Date   HGBA1C 6.0 01/02/2023   HGBA1C 5.9 06/14/2022   HGBA1C 6.0 12/05/2021   A/P: update a1c with labs today  #COPD- no breathing issues but incicental finding on lung cancer screening- encouraged cessation of smoking   Recommended follow up: No follow-ups on file. Future Appointments  Date Time Provider Department Center  06/16/2024  3:00 PM Marcos Eke, PA-C [facility] None  12/16/2024  8:00 AM [facility] ANNUAL WELLNESS VISIT 1 [facility]   Lab/Order associations: fasting   ICD-10-CM   1. Preventative health care  Z00.00     2. Screening for diabetes mellitus  Z13.1     3. Essential hypertension, benign  I10     4. Hyperglycemia  R73.9     5. Hyperlipidemia, unspecified hyperlipidemia type  E78.5     6. 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 Factor V Leiden  D68.51     8. Current smoker  F17.200     9. Screening for prostate cancer  Z12.5     10. Inattention  R41.840        No orders of the defined types were placed in this encounter.   Return precautions advised.  Tana Conch, MD

## 2024-01-23 NOTE — Patient Instructions (Addendum)
 Make sure to keep doing your annual lung cancer screening with pulmonology  Please stop by lab before you go If you have mychart- we will send your results within 3 business days of [place] receiving them.  If you do not have mychart- we will call you about results within 5 business days of [place] receiving them.  *please also note that you will see labs on mychart as soon as they post. I will later go in and write notes on them- will say "notes from Dr. Durene Cal"   We have placed a referral for you today to psyciatry- please call their # if you do not hear within a week (may be listed below or you may see mychart message within a few days with #).   Recommended follow up: Return in about 6 months (around 07/24/2024) for followup or sooner if needed.Schedule b4 you leave.

## 2024-01-27 ENCOUNTER — Other Ambulatory Visit: Payer: Self-pay | Admitting: Family Medicine

## 2024-02-21 ENCOUNTER — Other Ambulatory Visit: Payer: Self-pay | Admitting: Family Medicine

## 2024-02-24 ENCOUNTER — Other Ambulatory Visit: Payer: Self-pay | Admitting: Family Medicine

## 2024-02-29 ENCOUNTER — Other Ambulatory Visit: Payer: Self-pay | Admitting: Family Medicine

## 2024-03-05 ENCOUNTER — Other Ambulatory Visit: Payer: Self-pay

## 2024-03-05 ENCOUNTER — Encounter (HOSPITAL_COMMUNITY): Payer: Self-pay | Admitting: Psychiatry

## 2024-03-05 ENCOUNTER — Ambulatory Visit (HOSPITAL_BASED_OUTPATIENT_CLINIC_OR_DEPARTMENT_OTHER): Payer: Self-pay | Admitting: Psychiatry

## 2024-03-05 VITALS — BP 133/85 | HR 65 | Ht 75.0 in | Wt 232.2 lb

## 2024-03-05 DIAGNOSIS — G3184 Mild cognitive impairment, so stated: Secondary | ICD-10-CM | POA: Diagnosis not present

## 2024-03-05 DIAGNOSIS — G309 Alzheimer's disease, unspecified: Secondary | ICD-10-CM

## 2024-03-05 NOTE — Progress Notes (Signed)
 Psychiatric Initial Adult Assessment   Patient Identification: KERRINGTON GREENHALGH MRN:  [id number] Date of Evaluation:  03/05/2024 Referral Source: Adra Alanis and Dr. Jona Negro Chief Complaint: I cannot remember Chief Complaint  Patient presents with   Establish Care   Visit Diagnosis: Dementia  History of Present Illness:   This patient is a 73 year old white married father who for the last 3 or 4 years has been evaluated for a progressive memory loss.  He has been clearly documented by neuro psychological studies.  It does seem to progress however it should be noted that this individual's functioning only at a very high level.  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  He continues to drive without apparent problems.  He starts off by saying that he cannot remember things much at all.  He claims that is the biggest problem.  Yet he denies daily depression or problems with anxiety.  He acknowledges that he has significant conflict with his wife.  She is not here today.  At the end of the session I called her in front of the patient and she said that she was not there because her husband did not want her there.  The patient does not remember this.  The patient at work still seems to function and he seems to get along pretty well with the workers at his supervisor.  He denies daily depression.  He denies any problems with sleep or appetite or energy.  He claims he has some problems thinking and concentrating.  He acknowledges a decrease in self-esteem but denies being worthless.  He is not suicidal now and never has been.  He still enjoys watching TV time with his daughter and 4 cats.  He has 2 grandchildren who are doing pretty well.  He has 2 adult children who seem to be somewhat stable.  It is noted also that his daughter-in-law apparently had a serious motor vehicle accident and is impaired/disabling.  The patient denies use of alcohol or drugs.  He has never  had psychotic symptoms by his memory.  He denies episodes of major depression or mania.  He has no evidence of generalized anxiety disorder or panic disorder or obsessive-compulsive disorder.  He is on 2 antidepressants but does not know why or if they had any benefit.  In the last year he had a Mini-Mental status score of 28.  His medical illnesses hypercholesterolemia. His past psychiatric history demonstrates no history of a psychiatric hospitalization or ever seeing a psychiatrist.  Apparently the reason that he is being referred here is to evaluate the possibility of attention deficit disorder or perhaps a medication that could improve his attention.  Associated Signs/Symptoms: Depression Symptoms:   (Hypo) Manic Symptoms:   Anxiety Symptoms:   Psychotic Symptoms:   PTSD Symptoms: NA  Past Psychiatric History: Wellbutrin  and Pristiq   Previous Psychotropic Medications: Yes   Substance Abuse History in the last 12 months:  No.  Consequences of Substance Abuse: Negative  Past Medical History:  Past Medical History:  Diagnosis Date   Allergic rhinitis 07/31/2007   Loratadine  at times- but mild mental fog on this    BPH associated with nocturia 03/10/2018   Chronic anticoagulation 10/18/2022   Clotting disorder    Current smoker 07/26/2014   Essential hypertension, benign 07/26/2014   Benazepril  20mg     Factor V Leiden 07/31/2007   Xarelto  20mg  daily. 2 DVT 2001, PE 2011.      History of  colonic polyps 07/31/2007   02/2015 adenoma. 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 Patient states thought was external.      Left inguinal hernia 12/05/2016   Declines surgery referral- minimally bothering him. Can call in if he changes his mind   Major depressive disorder in full remission 03/24/2009   Wellbutrin  xl 300mg -->150mg --> off  03/2020  pristiq  50mg .   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 Ty Gales sees him now. On aricept .     Osteoarthritis    Right rotator cuff tear 12/07/2010   S/p surgery.     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 COmpression stocking on left. L >R swelling-history of 1st DVT in L.       Past Surgical History:  Procedure Laterality Date   COLONOSCOPY     right rotator cuff repair     SALIVARY STONE REMOVAL      Family Psychiatric History:   Family History:  Family History  Problem Relation Age of Onset   Memory loss Mother    Colon polyps Father    Colon cancer Father 73   Factor V Leiden deficiency Son    Esophageal cancer Neg Hx    Rectal cancer Neg Hx    Stomach cancer Neg Hx     Social History:   Social History   Socioeconomic History   Marital status: Married    Spouse name: Not on file   Number of children: 2   Years of education: 12   Highest education level: High school graduate  Occupational History   Occupation: Maintenance  Tobacco Use   Smoking status: Every Day    Current packs/day: 1.00    Average packs/day: 1 pack/day for 57.0 years (57.0 ttl pk-yrs)    Types: Cigarettes   Smokeless tobacco: Never   Tobacco comments:    1 ppd  Vaping Use   Vaping status: Never Used  Substance and Sexual Activity   Alcohol use: Yes    Comment: rare alcohol consumption   Drug use: No   Sexual activity: Yes  Other Topics Concern   Not on file  Social History Narrative   Married (wife patient of Dr. Arlene Ben). Son and daughter. 1 granddaughter.       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       Hobbies: time around house, time with family, yardwork, some cabinetry      Lives in 2 story home with wife and granddaughter, Brewing technologist school graduate      Right handed    Social Drivers of Health   Financial Resource Strain: Low Risk  (12/11/2023)   Overall Financial Resource Strain (CARDIA)    Difficulty of Paying Living Expenses: Not hard at all  Food Insecurity: No Food Insecurity (12/11/2023)   Hunger Vital Sign    Worried About Running Out of Food in the Last Year: Never true    Ran Out of Food in the Last Year: Never true  Transportation Needs:  No Transportation Needs (12/11/2023)   PRAPARE - Administrator, Civil Service (Medical): No    Lack of Transportation (Non-Medical): No  Physical Activity: Sufficiently Active (12/11/2023)   Exercise Vital Sign    Days of Exercise per Week: 5 days    Minutes of Exercise per Session: 60 min  Stress: No Stress Concern Present (12/11/2023)   [employer] of Occupational Health - Occupational Stress Questionnaire    Feeling of Stress : Not at all  Social Connections: Moderately Isolated (12/11/2023)   Social Connection and Isolation Panel [NHANES]    Frequency of Communication with Friends and Family: Once a week    Frequency of Social Gatherings with Friends and Family: More than three times a week    Attends Religious Services: Never    Database administrator or Organizations: No    Attends Banker Meetings: Never    Marital Status: Married    Additional Social History:  Allergies:   Allergies  Allergen Reactions   Yellow Jacket Venom [Bee Venom] Shortness Of Breath    Had swelling localized, chest tightness, and shortness of breath    Metabolic Disorder Labs: Lab Results  Component Value Date   HGBA1C 5.7 01/23/2024   MPG 114 09/22/2020   No results found for: "PROLACTIN" Lab Results  Component Value Date   CHOL 141 01/23/2024   TRIG 93.0 01/23/2024   HDL 47.30 01/23/2024   CHOLHDL 3  01/23/2024   VLDL 18.6 01/23/2024   LDLCALC 75 01/23/2024   LDLCALC 84 06/14/2022   Lab Results  Component Value Date   TSH 1.314 09/10/2022    Therapeutic Level Labs: No results found for: "LITHIUM" No results found for: "CBMZ" No results found for: "VALPROATE"  Current Medications: Current Outpatient Medications  Medication Sig Dispense Refill   buPROPion  (WELLBUTRIN  XL) 150 MG 24 hr tablet Take 1 tablet by mouth once daily 30 tablet 0   desvenlafaxine  (PRISTIQ ) 50 MG 24 hr tablet Take 1 tablet (50 mg total) by mouth daily. 90 tablet 3   donepezil  (ARICEPT ) 10 MG tablet Take 1 tablet (10 mg total) by mouth at bedtime. 90 tablet 3   EPINEPHrine  (EPIPEN  2-PAK) 0.3 mg/0.3 mL IJ SOAJ injection Inject 0.3 mg into the muscle as needed for anaphylaxis. 1 each 0   memantine  (NAMENDA ) 10 MG tablet Take 1 tablet  twice a day 60 tablet 11   Multiple Vitamin (MULTIVITAMIN) capsule Take by mouth.     rivaroxaban  (XARELTO ) 20 MG TABS tablet Take 1 tablet (20 mg total) by mouth daily. 90 tablet 3   rosuvastatin  (CRESTOR ) 40 MG tablet Take 1 tablet by mouth once daily 90 tablet 0   triamcinolone  cream (KENALOG ) 0.1 % Apply 1 Application topically 2 (two) times daily. 30 g 0   No current facility-administered medications for this visit.    Musculoskeletal: Strength & Muscle Tone: within normal limits Gait & Station: normal Patient leans: N/A  Psychiatric Specialty Exam: Review of Systems  Blood pressure 133/85, pulse 65, height 6\' 3"  (1.905 m), weight 232 lb 3.2 oz (105.3 kg).Body mass index is 29.02 kg/m.  General Appearance: NA  Eye Contact:  Good  Speech:  Clear and Coherent  Volume:  Normal  Mood:  NA  Affect:  Appropriate  Thought Process:  Coherent  Orientation:  Full (Time, Place, and Person)  Thought Content:  WDL  Suicidal Thoughts:  No  Homicidal Thoughts:  No  Memory:  Recent;   Fair  Judgement:  Good  Insight:  Fair  Psychomotor Activity:  Normal  Concentration:     Recall:  Poor  Fund of Knowledge:Fair  Language: Good  Akathisia:  No  Handed:  Right  AIMS (if indicated):  not done  Assets:  Desire for Improvement  ADL's:  Intact  Cognition: WNL  Sleep:  Good   Screenings: GAD-7    Flowsheet Row Office Visit from 08/22/2023 in [facility] [employer] [facility] from 05/13/2023 in [facility] [employer] from 01/02/2023 in [facility]  Total GAD-7 Score 0 0 0      Mini-Mental    Flowsheet Row Office Visit from 12/15/2023 in [facility] Office Visit from 06/13/2023 in [facility] Neu[facility] Office Visit from 12/06/2022 in [facility] Neurology Office Visit from 06/07/2022 in [facility] Neurology Office Visit from 11/09/2021 in [facility] Neurology  Total Score (max 30 points ) 28 27 25 29 22       PHQ2-9    Flowsheet Row Office Visit from 01/23/2024 in [facility] Clinical Support from 12/11/2023 in [facility] [employer] Visit from 08/22/2023 in [facility] [employer] Visit from 05/13/2023 in [facility] [employer] Visit from 01/02/2023 in [facility]  PHQ-2 Total Score 1 0 1 2 0  PHQ-9 Total Score 1 -- 2 4 0      Flowsheet Row ED from 05/03/2023 in [facility] at [facility] ED from 09/11/2022 in [facility] Emergency Department at [facility] ED from 09/10/2022 in [facility] Emergency Department at [facility]  C-SSRS RISK CATEGORY No Risk No Risk No Risk       Assessment and Plan:   Today this patient presented in a good mood.  His humor is very intact.  He is easygoing.  It is difficult to do a thorough evaluation and this individual as he openly admits that he cannot  remember much about the past.  What is very evident is that he is functioning quite well.  Reading is evaluations from his neuropsychologist assessment he has problems storing.  I do not believe he really has attention issues at all.  I will closely evaluate his testing once again.  What I am interested in is has the Wellbutrin  and Pristiq  that is what now made any difference.  He does not remember.  Today I got a commitment from him and his wife and her in their next visit within the next month that she will attend.  He should continue taking Aricept  and Namenda .  Of course is not clear how much Aricept  and that has helped but certainly it should be continued.  The use of stimulants and Alzheimer's dementia has been used in cases of significant lethargy and apathy.  I find either these 2 features in this patient.  He is outgoing and friendly and very engageable.  He will be reevaluated within the next month.  He certainly is not suicidal.  His workup so far for his memory disorder has been essentially negative.  His MRI had nonspecific findings.  The possibility of using an amyloid PET scan certainly might be considered.  Collaboration of Care:   Patient/Guardian was advised Release of  Information must be obtained prior to any record release in order to collaborate their care with an outside provider. Patient/Guardian was advised if they have not already done so to contact the registration department to sign all necessary forms in order for us  to release information regarding their care.   Consent: Patient/Guardian gives verbal consent for treatment and assignment of benefits for services provided during this visit. Patient/Guardian expressed understanding and agreed to proceed.   Delorse Fey, MD 5/23/20259:31 AM

## 2024-03-16 ENCOUNTER — Other Ambulatory Visit: Payer: Self-pay | Admitting: Acute Care

## 2024-03-16 DIAGNOSIS — Z87891 Personal history of nicotine dependence: Secondary | ICD-10-CM

## 2024-03-16 DIAGNOSIS — F17210 Nicotine dependence, cigarettes, uncomplicated: Secondary | ICD-10-CM

## 2024-03-21 ENCOUNTER — Other Ambulatory Visit: Payer: Self-pay | Admitting: Family Medicine

## 2024-03-30 ENCOUNTER — Encounter (HOSPITAL_COMMUNITY): Payer: Self-pay | Admitting: Psychiatry

## 2024-03-30 ENCOUNTER — Other Ambulatory Visit: Payer: Self-pay

## 2024-03-30 ENCOUNTER — Ambulatory Visit (HOSPITAL_BASED_OUTPATIENT_CLINIC_OR_DEPARTMENT_OTHER): Admitting: Psychiatry

## 2024-03-30 VITALS — BP 125/79 | HR 94 | Ht 75.0 in | Wt 231.0 lb

## 2024-03-30 DIAGNOSIS — F0393 Unspecified dementia, unspecified severity, with mood disturbance: Secondary | ICD-10-CM | POA: Diagnosis not present

## 2024-03-30 MED ORDER — BUPROPION HCL ER (XL) 150 MG PO TB24: ORAL_TABLET | ORAL | 5 refills | Status: DC

## 2024-03-30 NOTE — Progress Notes (Signed)
 Psychiatric Initial Adult Assessment   Patient Identification: Rodney Scott MRN:  [id number] Date of Evaluation:  03/30/2024 Referral Source: Adra Alanis and Dr. Jona Negro Chief Complaint: I cannot remember No chief complaint on file.  Visit Diagnosis: Dementia  History of Present Illness:   Today the patient is seen with his wife.  The patient is no different than he was when I saw him approximately a month ago.  The patient clearly has a documented cognitive deficit that I suspect is very mild.  His wife said that he has had 3 neuropsych tests and the last one was not much different than the one previous.  This implies that whenever cognitive disorder he has it is not rapidly progressing.  The patient takes the maximum treatment for Alzheimer's Aricept  and Namenda .  They can tell if it is made a big difference.  He is also being treated for superimposed depression with Pristiq  and Wellbutrin .  They share that when one of the antidepressants were removed he seemed to be not as good.  In his close evaluation as we can get the patient seems to have psychomotor slowing.  He denies persistent depression.  He seems to be mildly anhedonic.  But is functioning in many ways is good.  He goes to work 40 hours and does not a lot of things at work.  He is not having any problems with performance at work.  He is sleeping well and eating well.  He denies any energy loss.  He drinks no alcohol and uses no drugs.  Ultimately his wife and the patient acknowledged that there is a dysphoric mood perhaps this is in reaction to the diagnosis of a cognitive deficit.  Patient does not seem to act that way.  He seems to act in different.  This is probably more typical of a dementing disorder.  The approach are treating him with a stimulant generally would be not indicated.  On the other hand the test ability of increasing his energy level by increasing psychotropic medicines that increase norepinephrine would be a  reasonable approach.  Associated Signs/Symptoms: Depression Symptoms:   (Hypo) Manic Symptoms:   Anxiety Symptoms:   Psychotic Symptoms:   PTSD Symptoms: NA  Past Psychiatric History: Wellbutrin  and Pristiq   Previous Psychotropic Medications: Yes   Substance Abuse History in the last 12 months:  No.  Consequences of Substance Abuse: Negative  Past Medical History:  Past Medical History:  Diagnosis Date   Allergic rhinitis 07/31/2007   Loratadine  at times- but mild mental fog on this    BPH associated with nocturia 03/10/2018   Chronic anticoagulation 10/18/2022   Clotting disorder    Current smoker 07/26/2014   Essential hypertension, benign 07/26/2014   Benazepril  20mg     Factor V Leiden 07/31/2007   Xarelto  20mg  daily. 2 DVT 2001, PE 2011.      History of colonic polyps 07/31/2007   02/2015 adenoma. 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 Patient states thought was external.      Left inguinal hernia 12/05/2016   Declines surgery referral- minimally bothering him. Can call in if he changes his mind   Major depressive disorder in full remission 03/24/2009   Wellbutrin  xl 300mg -->150mg --> off 03/2020  pristiq  50mg .   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 Ty Gales sees him now. On aricept .     Osteoarthritis    Right rotator cuff tear 12/07/2010   S/p surgery.     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 COmpression stocking on left. L >R swelling-history of 1st DVT in L.       Past Surgical History:  Procedure Laterality Date   COLONOSCOPY      right rotator cuff repair     SALIVARY STONE REMOVAL      Family Psychiatric History:   Family History:  Family History  Problem Relation Age of Onset   Memory loss Mother    Colon polyps Father    Colon cancer Father 63   Factor V Leiden deficiency Son    Esophageal cancer Neg Hx    Rectal cancer Neg Hx    Stomach cancer Neg Hx     Social History:   Social History   Socioeconomic History   Marital status: Married    Spouse name: Not on file   Number of children: 2   Years of education: 12   Highest education level: High school graduate  Occupational History   Occupation: Maintenance  Tobacco Use   Smoking status: Every Day    Current packs/day: 1.00    Average packs/day: 1 pack/day for 57.0 years (57.0 ttl pk-yrs)    Types: Cigarettes   Smokeless tobacco: Never   Tobacco comments:    1 ppd  Vaping Use   Vaping status: Never Used  Substance and Sexual Activity   Alcohol use: Yes    Comment: rare alcohol consumption   Drug use: No   Sexual activity: Yes  Other Topics Concern   Not on file  Social History Narrative   Married (wife patient of Dr. Arlene Ben). Son and daughter. 1 granddaughter.       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      Hobbies: time around house, time with family, yardwork, some cabinetry      Lives in 2 story home with wife and granddaughter, Brewing technologist school graduate      Right handed    Social Drivers of Health   Financial Resource Strain: Low Risk  (12/11/2023)   Overall Financial Resource Strain (CARDIA)    Difficulty of Paying Living Expenses: Not hard at all  Food Insecurity: No Food Insecurity (12/11/2023)   Hunger Vital Sign    Worried About Running Out of Food in the Last Year: Never true    Ran Out of Food in the Last Year: Never true  Transportation Needs: No Transportation Needs (12/11/2023)   PRAPARE - Doctor, general practice (Medical): No    Lack of Transportation (Non-Medical): No  Physical Activity: Sufficiently Active (12/11/2023)   Exercise Vital Sign    Days of Exercise per Week: 5 days    Minutes of Exercise per Session: 60 min  Stress: No Stress Concern Present (12/11/2023)   [employer] of Occupational Health - Occupational Stress Questionnaire    Feeling of Stress : Not at all  Social Connections: Moderately Isolated (12/11/2023)   Social Connection and Isolation Panel    Frequency of Communication with Friends  and Family: Once a week    Frequency of Social Gatherings with Friends and Family: More than three times a week    Attends Religious Services: Never    Database administrator or Organizations: No    Attends Banker Meetings: Never    Marital Status: Married    Additional Social History:  Allergies:   Allergies  Allergen Reactions   Yellow Jacket Venom [Bee Venom] Shortness Of Breath    Had swelling localized, chest tightness, and shortness of breath    Metabolic Disorder Labs: Lab Results  Component Value Date   HGBA1C 5.7 01/23/2024   MPG 114 09/22/2020   No results found for: PROLACTIN Lab Results  Component Value Date   CHOL 141 01/23/2024   TRIG 93.0 01/23/2024   HDL 47.30 01/23/2024   CHOLHDL 3 01/23/2024   VLDL 18.6 01/23/2024   LDLCALC 75 01/23/2024   LDLCALC 84 06/14/2022   Lab Results  Component Value Date   TSH 1.314 09/10/2022    Therapeutic Level Labs: No results found for: LITHIUM No results found for: CBMZ No results found for: VALPROATE  Current Medications: Current Outpatient Medications  Medication Sig Dispense Refill   desvenlafaxine  (PRISTIQ ) 50 MG 24 hr tablet Take 1 tablet (50 mg total) by mouth daily. 90 tablet 3   donepezil  (ARICEPT ) 10 MG tablet Take 1 tablet (10 mg total) by mouth at bedtime. 90 tablet 3   EPINEPHrine  (EPIPEN  2-PAK) 0.3 mg/0.3 mL IJ SOAJ injection Inject 0.3 mg into the muscle as  needed for anaphylaxis. 1 each 0   memantine  (NAMENDA ) 10 MG tablet Take 1 tablet  twice a day 60 tablet 11   Multiple Vitamin (MULTIVITAMIN) capsule Take by mouth.     rivaroxaban  (XARELTO ) 20 MG TABS tablet Take 1 tablet (20 mg total) by mouth daily. 90 tablet 3   rosuvastatin  (CRESTOR ) 40 MG tablet Take 1 tablet by mouth once daily 90 tablet 0   triamcinolone  cream (KENALOG ) 0.1 % Apply 1 Application topically 2 (two) times daily. 30 g 0   buPROPion  (WELLBUTRIN  XL) 150 MG 24 hr tablet 2 qam  for 1 month then 3 qam 90 tablet 5   No current facility-administered medications for this visit.    Musculoskeletal: Strength & Muscle Tone: within normal limits Gait & Station: normal Patient leans: N/A  Psychiatric Specialty Exam: Review of Systems  Blood pressure 125/79, pulse 94, height 6' 3 (1.905 m), weight 231 lb (104.8 kg).Body mass index is 28.87 kg/m.  General Appearance: NA  Eye Contact:  Good  Speech:  Clear and Coherent  Volume:  Normal  Mood:  NA  Affect:  Appropriate  Thought Process:  Coherent  Orientation:  Full (Time, Place, and Person)  Thought Content:  WDL  Suicidal Thoughts:  No  Homicidal Thoughts:  No  Memory:  Recent;   Fair  Judgement:  Good  Insight:  Fair  Psychomotor Activity:  Normal  Concentration:    Recall:  Poor  Fund of Knowledge:Fair  Language: Good  Akathisia:  No  Handed:  Right  AIMS (if indicated):  not done  Assets:  Desire for Improvement  ADL's:  Intact  Cognition: WNL  Sleep:  Good   Screenings: GAD-7    Flowsheet Row Office Visit from 08/22/2023 in [facility] [employer] from 05/13/2023 in [facility] [employer] [employer] from 01/02/2023 in [facility] [place]  Total GAD-7 Score 0 0 0   Mini-Mental    Flowsheet Row Office Visit from 12/15/2023 in [facility] Office Visit from 06/13/2023 in [facility]  Neurology Office Visit from 12/06/2022 in [facility] Neurology Office Visit from 06/07/2022 in [facility] Neurology Office Visit from 11/09/2021 in [facility] Neurology  Total Score (max 30 points ) 28 27 25 29 22    PHQ2-9    Flowsheet Row Office Visit from 01/23/2024 in [facility] [place] Clinical Support from 12/11/2023 in [facility] [employer] Visit from 08/22/2023 in [facility] [employer] from 05/13/2023 in [facility] [employer] Visit from 01/02/2023 in [facility] [place]  PHQ-2 Total Score 1 0 1 2 0  PHQ-9 Total Score 1 -- 2 4 0   Flowsheet Row ED from 05/03/2023 in [facility] Department at [facility] ED from 09/11/2022 in [facility] Emergency Department at [facility] ED from 09/10/2022 in [facility] Emergency Department at [facility]  C-SSRS RISK CATEGORY No Risk No Risk No Risk    Assessment and Plan:    At this time we will continue his Pristiq  but increase his Wellbutrin  to the maximum dose of 450 mg over the next 2-1/2 months.  This will have a maximal effect on norepinephrine.  We will leave his Pristiq  as it is and when he returns in 2-1/2 months that he shows no difference there is the possibility of adding Strattera.  Strattera is much safer than the stimulant and this gentleman of 73 years of age.  Strattera also will increase his norepinephrine affect.  This patient should be reevaluated in 2-1/2 months.  Collaboration of Care:   Patient/Guardian was advised Release of Information must be obtained prior to any record release in order to collaborate their care with an outside provider. Patient/Guardian was advised if they have not already done so to contact the registration department to sign all necessary forms in order for us  to release information  regarding their care.   Consent: Patient/Guardian gives verbal consent for treatment and assignment of benefits for services provided during this visit. Patient/Guardian expressed understanding and agreed to proceed.   Delorse Fey, MD 6/17/20254:07 PM

## 2024-04-02 ENCOUNTER — Encounter: Payer: Self-pay | Admitting: Physician Assistant

## 2024-04-09 ENCOUNTER — Ambulatory Visit
Admission: RE | Admit: 2024-04-09 | Discharge: 2024-04-09 | Disposition: A | Discharge status: Home / Self Care | Source: Ambulatory Visit | Attending: Acute Care | Admitting: Acute Care

## 2024-04-09 DIAGNOSIS — Z87891 Personal history of nicotine dependence: Secondary | ICD-10-CM

## 2024-04-09 DIAGNOSIS — F17210 Nicotine dependence, cigarettes, uncomplicated: Secondary | ICD-10-CM | POA: Diagnosis not present

## 2024-04-09 DIAGNOSIS — Z122 Encounter for screening for malignant neoplasm of respiratory organs: Secondary | ICD-10-CM | POA: Diagnosis not present

## 2024-04-20 ENCOUNTER — Other Ambulatory Visit: Payer: Self-pay

## 2024-04-20 DIAGNOSIS — Z87891 Personal history of nicotine dependence: Secondary | ICD-10-CM

## 2024-04-20 DIAGNOSIS — F17210 Nicotine dependence, cigarettes, uncomplicated: Secondary | ICD-10-CM

## 2024-04-20 DIAGNOSIS — Z122 Encounter for screening for malignant neoplasm of respiratory organs: Secondary | ICD-10-CM

## 2024-04-21 ENCOUNTER — Other Ambulatory Visit: Payer: Self-pay | Admitting: Family Medicine

## 2024-04-29 ENCOUNTER — Ambulatory Visit: Admitting: Physician Assistant

## 2024-06-01 ENCOUNTER — Ambulatory Visit (INDEPENDENT_AMBULATORY_CARE_PROVIDER_SITE_OTHER): Admitting: Psychiatry

## 2024-06-01 VITALS — BP 130/80 | HR 89 | Resp 16 | Ht 75.0 in | Wt 228.6 lb

## 2024-06-01 DIAGNOSIS — F341 Dysthymic disorder: Secondary | ICD-10-CM

## 2024-06-01 MED ORDER — ATOMOXETINE HCL 40 MG PO CAPS
ORAL_CAPSULE | ORAL | 3 refills | Status: DC
Start: 2024-06-01 — End: 2024-08-03

## 2024-06-01 NOTE — Progress Notes (Signed)
 Psychiatric Initial Adult Assessment   Patient Identification: Rodney Scott MRN:  [id number] Date of Evaluation:  06/01/2024 Referral Source: Lauraine Netter and Dr. Elspeth Lukes Chief Complaint: I cannot remember No chief complaint on file.  Visit Diagnosis: Dementia  History of Present Illness:   Today the patient is seen with his wife [name].  The patient does not feel anything subjectively different the higher dose of Wellbutrin .  However his wife says that he is not sleeping as much.  That is up more.  When asked what he is doing when he is his only that he is playing more on his cell phone.  The patient has a hard time describing what he does for enjoyment.  What is interesting is the patient works 40 hours a week at a very mundane job working for the [employer].  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  At 1 time he had a more intense job higher paid job with a lot of her responsibilities.  Now he is more like a laborer.  He does not seem to mind it.  He says he does not get bored.  He plays solitaire on his phone.  He does still seem to enjoy some sports as were entering into the football season.  Note is that he has had 3 neuropsychological tests that all show evidence of dementia but no evidence of progression.  His wife does not see any difference in his progression of cognitive problems.  Generally she describes him as being withdrawn.  Somebody who no longer engages very much with anyone.  He does not seem to have a zest for life.  On the other hand he denies wanting to end his life.  When asked if everything stays as it is with that be okay he says I do not know.  Associated Signs/Symptoms: Depression Symptoms:   (Hypo) Manic Symptoms:   Anxiety Symptoms:   Psychotic Symptoms:   PTSD Symptoms: NA  Past Psychiatric History: Wellbutrin  and Pristiq   Previous Psychotropic Medications: Yes   Substance Abuse History in the last 12 months:  No.  Consequences  of Substance Abuse: Negative  Past Medical History:  Past Medical History:  Diagnosis Date   Allergic rhinitis 07/31/2007   Loratadine  at times- but mild mental fog on this    BPH associated with nocturia 03/10/2018   Chronic anticoagulation 10/18/2022   Clotting disorder    Current smoker 07/26/2014   Essential hypertension, benign 07/26/2014   Benazepril  20mg     Factor V Leiden 07/31/2007   Xarelto  20mg  daily. 2 DVT 2001, PE 2011.      History of colonic polyps 07/31/2007   02/2015 adenoma. 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 Patient states thought was external.      Left inguinal hernia 12/05/2016   Declines surgery referral- minimally bothering him. Can call in if he changes his mind   Major depressive disorder in full remission 03/24/2009   Wellbutrin  xl 300mg -->150mg --> off 03/2020  pristiq  50mg .   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 Georjean sees him now. On aricept .     Osteoarthritis    Right rotator cuff tear 12/07/2010  S/p surgery.     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 COmpression stocking on left. L >R swelling-history of 1st DVT in L.       Past Surgical History:  Procedure Laterality Date   COLONOSCOPY     right rotator cuff repair     SALIVARY STONE REMOVAL      Family Psychiatric History:   Family History:  Family History  Problem Relation Age of Onset   Memory loss Mother    Colon polyps Father    Colon cancer Father 67   Factor V Leiden deficiency Son    Esophageal cancer Neg Hx    Rectal cancer Neg Hx     Stomach cancer Neg Hx     Social History:   Social History   Socioeconomic History   Marital status: Married    Spouse name: Not on file   Number of children: 2   Years of education: 12   Highest education level: High school graduate  Occupational History   Occupation: Maintenance  Tobacco Use   Smoking status: Every Day    Current packs/day: 1.00    Average packs/day: 1 pack/day for 57.0 years (57.0 ttl pk-yrs)    Types: Cigarettes   Smokeless tobacco: Never   Tobacco comments:    1 ppd  Vaping Use   Vaping status: Never Used  Substance and Sexual Activity   Alcohol use: Yes    Comment: rare alcohol consumption   Drug use: No   Sexual activity: Yes  Other Topics Concern   Not on file  Social History Narrative   Married (wife patient of Dr. Katrinka). Son and daughter. 1 granddaughter.       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      Hobbies: time around house, time with family, yardwork, some cabinetry      Lives in 2 story home with wife and granddaughter, Brewing technologist school graduate      Right handed    Social Drivers of Health   Financial Resource Strain: Low Risk  (12/11/2023)   Overall Financial Resource Strain (CARDIA)    Difficulty of Paying Living Expenses: Not hard at all  Food Insecurity: No Food Insecurity (12/11/2023)   Hunger Vital Sign    Worried About Running Out of Food in the Last Year: Never true    Ran Out of Food in the Last Year: Never true  Transportation Needs: No Transportation Needs (12/11/2023)   PRAPARE - Administrator, Civil Service (Medical): No    Lack of Transportation (Non-Medical): No  Physical Activity: Sufficiently Active (12/11/2023)   Exercise Vital Sign    Days of Exercise per Week: 5 days    Minutes of Exercise per Session: 60 min  Stress: No Stress Concern Present (12/11/2023)   [employer] of Occupational Health -  Occupational Stress Questionnaire    Feeling of Stress : Not at all  Social Connections: Moderately Isolated (12/11/2023)   Social Connection and Isolation Panel    Frequency of Communication with Friends and Family: Once a week    Frequency of Social Gatherings with Friends and Family: More than three times a week    Attends Religious Services: Never    Database administrator or Organizations: No  Attends Banker Meetings: Never    Marital Status: Married    Additional Social History:  Allergies:   Allergies  Allergen Reactions   Yellow Jacket Venom [Bee Venom] Shortness Of Breath    Had swelling localized, chest tightness, and shortness of breath    Metabolic Disorder Labs: Lab Results  Component Value Date   HGBA1C 5.7 01/23/2024   MPG 114 09/22/2020   No results found for: PROLACTIN Lab Results  Component Value Date   CHOL 141 01/23/2024   TRIG 93.0 01/23/2024   HDL 47.30 01/23/2024   CHOLHDL 3 01/23/2024   VLDL 18.6 01/23/2024   LDLCALC 75 01/23/2024   LDLCALC 84 06/14/2022   Lab Results  Component Value Date   TSH 1.314 09/10/2022    Therapeutic Level Labs: No results found for: LITHIUM No results found for: CBMZ No results found for: VALPROATE  Current Medications: Current Outpatient Medications  Medication Sig Dispense Refill   atomoxetine  (STRATTERA ) 40 MG capsule 1 qam  for 1 month then 2 qam 60 capsule 3   buPROPion  (WELLBUTRIN  XL) 150 MG 24 hr tablet Take 1 tablet by mouth once daily 30 tablet 0   desvenlafaxine  (PRISTIQ ) 50 MG 24 hr tablet Take 1 tablet (50 mg total) by mouth daily. 90 tablet 3   donepezil  (ARICEPT ) 10 MG tablet Take 1 tablet (10 mg total) by mouth at bedtime. 90 tablet 3   EPINEPHrine  (EPIPEN  2-PAK) 0.3 mg/0.3 mL IJ SOAJ injection Inject 0.3 mg into the muscle as needed for anaphylaxis. 1 each 0   memantine  (NAMENDA ) 10 MG tablet Take 1 tablet  twice a day 60 tablet 11   Multiple Vitamin (MULTIVITAMIN)  capsule Take by mouth.     rivaroxaban  (XARELTO ) 20 MG TABS tablet Take 1 tablet (20 mg total) by mouth daily. 90 tablet 3   rosuvastatin  (CRESTOR ) 40 MG tablet Take 1 tablet by mouth once daily 90 tablet 0   triamcinolone  cream (KENALOG ) 0.1 % Apply 1 Application topically 2 (two) times daily. 30 g 0   No current facility-administered medications for this visit.    Musculoskeletal: Strength & Muscle Tone: within normal limits Gait & Station: normal Patient leans: N/A  Psychiatric Specialty Exam: Review of Systems  Blood pressure 130/80, pulse 89, resp. rate 16, height 6' 3 (1.905 m), weight 228 lb 9.6 oz (103.7 kg).Body mass index is 28.57 kg/m.  General Appearance: NA  Eye Contact:  Good  Speech:  Clear and Coherent  Volume:  Normal  Mood:  NA  Affect:  Appropriate  Thought Process:  Coherent  Orientation:  Full (Time, Place, and Person)  Thought Content:  WDL  Suicidal Thoughts:  No  Homicidal Thoughts:  No  Memory:  Recent;   Fair  Judgement:  Good  Insight:  Fair  Psychomotor Activity:  Normal  Concentration:    Recall:  Poor  Fund of Knowledge:Fair  Language: Good  Akathisia:  No  Handed:  Right  AIMS (if indicated):  not done  Assets:  Desire for Improvement  ADL's:  Intact  Cognition: WNL  Sleep:  Good   Screenings: GAD-7    Flowsheet Row Office Visit from 08/22/2023 in [facility] from 05/13/2023 in [facility] from 01/02/2023 in [facility] [place]  Total GAD-7 Score 0 0 0   Mini-Mental    Flowsheet Row Office Visit from 12/15/2023  in [facility] Neurology Office Visit from 06/13/2023 in [facility] Neurology Office Visit from 12/06/2022 in [facility] Neurology Office Visit from 06/07/2022 in [facility] Neurology Office Visit from 11/09/2021 in [facility] Neurology  Total Score (max 30  points ) 28 27 25 29 22    PHQ2-9    Flowsheet Row Office Visit from 01/23/2024 in [facility] [place] Clinical Support from 12/11/2023 in [facility] [employer] Visit from 08/22/2023 in [facility] [place] [facility] from 05/13/2023 in [facility] [employer] Visit from 01/02/2023 in [facility] [place]  PHQ-2 Total Score 1 0 1 2 0  PHQ-9 Total Score 1 -- 2 4 0   Flowsheet Row ED from 05/03/2023 in [facility] Emergency Department at [facility] ED from 09/11/2022 in [facility] Emergency Department at [facility] ED from 09/10/2022 in [facility] at [facility]  C-SSRS RISK CATEGORY No Risk No Risk No Risk    Assessment and Plan:    At this time 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  At this time he is on the maximum dose of Pristiq  and now the maximum dose of Wellbutrin .  We are hypothesizing that we are going for the neurotransmitter of norepinephrine to try to maximize it.  Today we are going to begin him on Strattera  40 mg it will take each morning for a month and then double it to 80 mg.  My goal was to try to increase his norepinephrine and hopefully improve his mood state.  It is hard to actually define her as being depressed although he clearly demonstrates significant features of anhedonia.  The possibility of changing his Pristiq  to Fetzima is also a possibility.  This would be to maximize any norepinephrine effect.  The patient to return to see me in 2 months.  Collaboration of Care:   Patient/Guardian was advised Release of Information must be obtained prior to any record release in order to collaborate their care with an outside provider. Patient/Guardian was advised if they have not already  done so to contact the registration department to sign all necessary forms in order for us  to release information regarding their care.   Consent: Patient/Guardian gives verbal consent for treatment and assignment of benefits for services provided during this visit. Patient/Guardian expressed understanding and agreed to proceed.   Elna LILLETTE Lo, MD 8/19/20252:42 PM

## 2024-06-16 ENCOUNTER — Ambulatory Visit: Admitting: Physician Assistant

## 2024-06-25 ENCOUNTER — Other Ambulatory Visit: Payer: Self-pay | Admitting: Family Medicine

## 2024-07-18 ENCOUNTER — Other Ambulatory Visit: Payer: Self-pay | Admitting: Physician Assistant

## 2024-07-27 ENCOUNTER — Ambulatory Visit: Admitting: Family Medicine

## 2024-08-03 ENCOUNTER — Ambulatory Visit (HOSPITAL_BASED_OUTPATIENT_CLINIC_OR_DEPARTMENT_OTHER): Admitting: Psychiatry

## 2024-08-03 DIAGNOSIS — F341 Dysthymic disorder: Secondary | ICD-10-CM | POA: Diagnosis not present

## 2024-08-03 MED ORDER — FETZIMA 40 MG PO CP24: ORAL_CAPSULE | ORAL | 4 refills | Status: DC

## 2024-08-03 MED ORDER — ATOMOXETINE HCL 40 MG PO CAPS: ORAL_CAPSULE | ORAL | 3 refills | Status: DC

## 2024-08-03 NOTE — Progress Notes (Signed)
 Psychiatric Initial Adult Assessment   Patient Identification: Rodney Scott MRN:  [id number] Date of Evaluation:  08/03/2024 Referral Source: Lauraine Netter and Dr. Elspeth Lukes Chief Complaint: I cannot remember No chief complaint on file.  Visit Diagnosis: Dementia  History of Present Illness:     Today the patient is seen again with his wife Rodney Scott.  According to Rodney Scott the patient shows a small degree of improvement.  He seems to have more engaged ability and is less withdrawn to a small degree.  His mood does not seem to be much different.  He continues to work 40 hours.  He says he really likes work.  Which is about the only thing that I seems that he likes.  He watches football seems to enjoy that.  He plays solitaire.  He is sleeping and eating pretty well.  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  Patient would describe perhaps a 20% improvement.  He is not suicidal.  He has never been psychotic he does not drink any alcohol..  Associated Signs/Symptoms: Depression Symptoms:   (Hypo) Manic Symptoms:   Anxiety Symptoms:   Psychotic Symptoms:   PTSD Symptoms: NA  Past Psychiatric History: Wellbutrin  and Pristiq   Previous Psychotropic Medications: Yes   Substance Abuse History in the last 12 months:  No.  Consequences of Substance Abuse: Negative  Past Medical History:  Past Medical History:  Diagnosis Date   Allergic rhinitis 07/31/2007   Loratadine  at times- but mild mental fog on this    BPH associated with nocturia 03/10/2018   Chronic anticoagulation 10/18/2022   Clotting disorder    Current smoker 07/26/2014   Essential hypertension, benign 07/26/2014   Benazepril  20mg     Factor V Leiden 07/31/2007   Xarelto  20mg  daily. 2 DVT 2001, PE 2011.      History of colonic polyps 07/31/2007   02/2015 adenoma. 2019- adenoma again     History of venous thrombosis and embolism 07/31/2007   Hyperglycemia 12/05/2016    Very mild- low 100s   Hyperlipidemia    Internal hemorrhoid 11/24/2009   No bleeding. Patient states thought was external.      Left inguinal hernia 12/05/2016   Declines surgery referral- minimally bothering him. Can call in if he changes his mind   Major depressive disorder in full remission 03/24/2009   Wellbutrin  xl 300mg -->150mg --> off 03/2020  pristiq  50mg .   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 Georjean sees him now. On aricept .     Osteoarthritis    Right rotator cuff tear 12/07/2010   S/p surgery.     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 COmpression stocking on left. L >R swelling-history of 1st DVT in L.       Past Surgical History:  Procedure Laterality Date   COLONOSCOPY     right rotator cuff repair     SALIVARY STONE REMOVAL      Family Psychiatric History:   Family History:  Family History  Problem Relation Age of Onset   Memory loss Mother    Colon polyps Father    Colon cancer Father 38   Factor V Leiden deficiency Son  Esophageal cancer Neg Hx    Rectal cancer Neg Hx    Stomach cancer Neg Hx     Social History:   Social History   Socioeconomic History   Marital status: Married    Spouse name: Not on file   Number of children: 2   Years of education: 12   Highest education level: High school graduate  Occupational History   Occupation: Maintenance  Tobacco Use   Smoking status: Every Day    Current packs/day: 1.00    Average packs/day: 1 pack/day for 57.0 years (57.0 ttl pk-yrs)    Types: Cigarettes   Smokeless tobacco: Never   Tobacco comments:    1 ppd  Vaping Use   Vaping status: Never Used  Substance and Sexual Activity   Alcohol use: Yes    Comment:  rare alcohol consumption   Drug use: No   Sexual activity: Yes  Other Topics Concern   Not on file  Social History Narrative   Married (wife patient of Dr. Katrinka). Son and daughter. 1 granddaughter.       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      Hobbies: time around house, time with family, yardwork, some cabinetry      Lives in 2 story home with wife and granddaughter, Brewing technologist school graduate      Right handed    Social Drivers of Health   Financial Resource Strain: Low Risk  (12/11/2023)   Overall Financial Resource Strain (CARDIA)    Difficulty of Paying Living Expenses: Not hard at all  Food Insecurity: No Food Insecurity (12/11/2023)   Hunger Vital Sign    Worried About Running Out of Food in the Last Year: Never true    Ran Out of Food in the Last Year: Never true  Transportation Needs: No Transportation Needs (12/11/2023)   PRAPARE - Administrator, Civil Service (Medical): No    Lack of Transportation (Non-Medical): No  Physical Activity: Sufficiently Active (12/11/2023)   Exercise Vital Sign    Days of Exercise per Week: 5 days    Minutes of Exercise per Session: 60 min  Stress: No Stress Concern Present (12/11/2023)   [employer] of Occupational Health - Occupational Stress Questionnaire    Feeling of Stress : Not at all  Social Connections: Moderately Isolated (12/11/2023)   Social Connection and Isolation Panel    Frequency of Communication with Friends and Family: Once a week    Frequency of Social Gatherings with Friends and Family: More than three times a week    Attends Religious Services: Never    Database administrator or Organizations: No    Attends Banker Meetings: Never    Marital Status: Married    Additional Social History:  Allergies:   Allergies  Allergen Reactions   Yellow Jacket Venom [Bee Venom] Shortness Of Breath     Had swelling localized, chest tightness, and shortness of breath    Metabolic Disorder Labs: Lab Results  Component Value Date   HGBA1C 5.7 01/23/2024   MPG 114 09/22/2020   No results found for: PROLACTIN Lab Results  Component Value Date   CHOL 141 01/23/2024   TRIG 93.0 01/23/2024   HDL 47.30 01/23/2024   CHOLHDL 3 01/23/2024   VLDL 18.6 01/23/2024   LDLCALC 75 01/23/2024   LDLCALC 84 06/14/2022   Lab Results  Component Value  Date   TSH 1.314 09/10/2022    Therapeutic Level Labs: No results found for: LITHIUM No results found for: CBMZ No results found for: VALPROATE  Current Medications: Current Outpatient Medications  Medication Sig Dispense Refill   Levomilnacipran HCl ER (FETZIMA) 40 MG CP24 1  qam  for 1 month then 2 qam 60 capsule 4   atomoxetine  (STRATTERA ) 40 MG capsule 1 qam  for 1 month then 2 qam 60 capsule 3   buPROPion  (WELLBUTRIN  XL) 150 MG 24 hr tablet Take 1 tablet by mouth once daily 30 tablet 0   desvenlafaxine  (PRISTIQ ) 50 MG 24 hr tablet Take 1 tablet (50 mg total) by mouth daily. 90 tablet 3   donepezil  (ARICEPT ) 10 MG tablet Take 1 tablet (10 mg total) by mouth at bedtime. 90 tablet 3   EPINEPHrine  (EPIPEN  2-PAK) 0.3 mg/0.3 mL IJ SOAJ injection Inject 0.3 mg into the muscle as needed for anaphylaxis. 1 each 0   memantine  (NAMENDA ) 10 MG tablet Take 1 tablet by mouth twice daily 60 tablet 2   Multiple Vitamin (MULTIVITAMIN) capsule Take by mouth.     rosuvastatin  (CRESTOR ) 40 MG tablet Take 1 tablet by mouth once daily 90 tablet 0   triamcinolone  cream (KENALOG ) 0.1 % Apply 1 Application topically 2 (two) times daily. 30 g 0   XARELTO  20 MG TABS tablet Take 1 tablet by mouth once daily 90 tablet 0   No current facility-administered medications for this visit.    Musculoskeletal: Strength & Muscle Tone: within normal limits Gait & Station: normal Patient leans: N/A  Psychiatric Specialty Exam: Review of Systems  There were no  vitals taken for this visit.There is no height or weight on file to calculate BMI.  General Appearance: NA  Eye Contact:  Good  Speech:  Clear and Coherent  Volume:  Normal  Mood:  NA  Affect:  Appropriate  Thought Process:  Coherent  Orientation:  Full (Time, Place, and Person)  Thought Content:  WDL  Suicidal Thoughts:  No  Homicidal Thoughts:  No  Memory:  Recent;   Fair  Judgement:  Good  Insight:  Fair  Psychomotor Activity:  Normal  Concentration:    Recall:  Poor  Fund of Knowledge:Fair  Language: Good  Akathisia:  No  Handed:  Right  AIMS (if indicated):  not done  Assets:  Desire for Improvement  ADL's:  Intact  Cognition: WNL  Sleep:  Good   Screenings: GAD-7    Flowsheet Row Office Visit from 08/22/2023 in [facility] [place] [facility] [employer] [facility] from 05/13/2023 in [facility] [employer] from 01/02/2023 in [facility] [place]  Total GAD-7 Score 0 0 0   Mini-Mental    Flowsheet Row Office Visit from 12/15/2023 in [facility] Neurology Office Visit from 06/13/2023 in [facility] Neurology Office Visit from 12/06/2022 in [facility] Neurology Office Visit from 06/07/2022 in [facility] Neurology Office Visit from 11/09/2021 in [facility] Neurology  Total Score (max 30 points ) 28 27 25 29 22    PHQ2-9    Flowsheet Row Office Visit from 01/23/2024 in [facility] [address] [place] Clinical Support from 12/11/2023 in [facility] [employer] Visit from 08/22/2023 in [facility] [employer] from 05/13/2023 in [facility] [place]  Office Visit from 01/02/2023 in [facility] [address] [place]  PHQ-2 Total Score 1 0 1 2 0  PHQ-9 Total Score 1 -- 2 4 0   Flowsheet Row ED from 05/03/2023 in [facility] at [facility] ED from 09/11/2022 in [facility] Department at [facility] ED from 09/10/2022 in [facility] Department at [facility]  C-SSRS RISK CATEGORY No Risk No Risk No Risk    Assessment and Plan:    The patient shares that the Wellbutrin  came first and then Pristiq  was added.  Today we are going to taper off the Pristiq  by giving 25 mg a day for 6 days and then discontinue it.  Will take 1 day off of it but continue the Wellbutrin  and the Strattera  and begin on Fetzima 40 mg every morning.  Will do that for 1 month and then increase to Fetzima to 80 mg.  At that point if he feels better we will continue everything and possibly increase to Fetzima to its maximum dose of 120 mg.  However at that point if he feels no difference after months of this treatment we will begin tapering him off slowly off of psychotropic medications and see if he feels any different without the medications.  It is hard for him to assess the effectiveness of these drugs.  On the other hand before looks that it is functioning well he sleeps and eats well and obviously has a reasonable amount of energy for 73 year old gentleman.  His wife and him however admit that he does little for rheumatoid and and he used to be very engageable with people.  Now he stays to himself and appears withdrawn.  The patient be seen again in 3 months.  Collaboration of Care:   Patient/Guardian was advised Release of Information must be obtained prior to any record release in order to collaborate their care with an outside provider. Patient/Guardian was advised if they have not already done so to contact the registration department to sign all necessary forms in order for us  to release information regarding their care.   Consent: Patient/Guardian gives verbal consent for treatment and assignment of benefits for services provided during this visit. Patient/Guardian expressed understanding  and agreed to proceed.   Elna LILLETTE Lo, MD 10/21/20253:29 PM

## 2024-08-13 ENCOUNTER — Encounter: Payer: Self-pay | Admitting: Family Medicine

## 2024-08-13 ENCOUNTER — Ambulatory Visit (INDEPENDENT_AMBULATORY_CARE_PROVIDER_SITE_OTHER): Admitting: Family Medicine

## 2024-08-13 VITALS — BP 126/78 | HR 89 | Temp 97.5°F | Ht 75.0 in | Wt 222.2 lb

## 2024-08-13 DIAGNOSIS — G3184 Mild cognitive impairment, so stated: Secondary | ICD-10-CM | POA: Diagnosis not present

## 2024-08-13 DIAGNOSIS — R739 Hyperglycemia, unspecified: Secondary | ICD-10-CM

## 2024-08-13 DIAGNOSIS — F17200 Nicotine dependence, unspecified, uncomplicated: Secondary | ICD-10-CM | POA: Diagnosis not present

## 2024-08-13 DIAGNOSIS — I10 Essential (primary) hypertension: Secondary | ICD-10-CM | POA: Diagnosis not present

## 2024-08-13 DIAGNOSIS — E785 Hyperlipidemia, unspecified: Secondary | ICD-10-CM

## 2024-08-13 DIAGNOSIS — G309 Alzheimer's disease, unspecified: Secondary | ICD-10-CM

## 2024-08-13 DIAGNOSIS — Z131 Encounter for screening for diabetes mellitus: Secondary | ICD-10-CM | POA: Diagnosis not present

## 2024-08-13 NOTE — Patient Instructions (Addendum)
 Glad you are doing well- hopefully the medicine tweaks help even further with memory/concentration  No changes today and bloodwork next visit  Recommended follow up: Return in about 6 months (around 02/10/2025) for followup or sooner if needed.Schedule b4 you leave.

## 2024-08-13 NOTE — Progress Notes (Signed)
 Phone [phone] In person visit   Subjective:   Rodney Scott is a 73 y.o. year old very pleasant male patient who presents for/with See problem oriented charting Chief Complaint  Patient presents with   Hypertension    6 month follow up    Past Medical History-  Patient Active Problem List   Diagnosis Date Noted   Mild cognitive impairment (MCI) due to Alzheimer's disease 09/19/2023    Priority: High   Current smoker 07/26/2014    Priority: High   Factor V Leiden 07/31/2007    Priority: High   History of venous thrombosis and embolism 07/31/2007    Priority: High   BPH associated with nocturia 03/10/2018    Priority: Medium    Hyperglycemia 12/05/2016    Priority: Medium    Essential hypertension, benign 07/26/2014    Priority: Medium    Major depressive disorder in full remission 03/24/2009    Priority: Medium    Hyperlipidemia 07/31/2007    Priority: Medium    Senile purpura 09/22/2020    Priority: Low   Left inguinal hernia 12/05/2016    Priority: Low   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    Priority: Low   Osteoarthritis 07/15/2008    Priority: Low   Venous (peripheral) insufficiency 12/11/2007    Priority: Low   Allergic rhinitis 07/31/2007    Priority: Low   History of colonic polyps 07/31/2007    Priority: Low   Chronic anticoagulation 10/18/2022    Medications- reviewed and updated Current Outpatient Medications  Medication Sig Dispense Refill   atomoxetine  (STRATTERA ) 40 MG capsule 1 qam  for 1 month then 2 qam 60 capsule 3   buPROPion  (WELLBUTRIN  XL) 150 MG 24 hr tablet Take 1 tablet by mouth once daily 30 tablet 0   donepezil  (ARICEPT ) 10 MG tablet Take 1 tablet (10 mg total) by mouth at bedtime. 90 tablet 3   EPINEPHrine  (EPIPEN  2-PAK) 0.3 mg/0.3 mL IJ SOAJ injection Inject 0.3 mg into the muscle as needed for anaphylaxis. 1 each 0   Levomilnacipran HCl ER (FETZIMA) 40 MG CP24 1  qam  for 1 month then 2 qam 60 capsule 4   memantine   (NAMENDA ) 10 MG tablet Take 1 tablet by mouth twice daily 60 tablet 2   Multiple Vitamin (MULTIVITAMIN) capsule Take by mouth.     rosuvastatin  (CRESTOR ) 40 MG tablet Take 1 tablet by mouth once daily 90 tablet 0   triamcinolone  cream (KENALOG ) 0.1 % Apply 1 Application topically 2 (two) times daily. 30 g 0   XARELTO  20 MG TABS tablet Take 1 tablet by mouth once daily 90 tablet 0   No current facility-administered medications for this visit.     Objective:  BP 126/78   Pulse 89   Temp (!) 97.5 F (36.4 C) (Temporal)   Ht 6' 3 (1.905 m)   Wt 222 lb 3.2 oz (100.8 kg)   SpO2 95%   BMI 27.77 kg/m  Gen: NAD, resting comfortably CV: RRR no murmurs rubs or gallops Lungs: CTAB no crackles, wheeze, rhonchi Abdomen: soft/nontender/nondistended/normal bowel sounds. No rebound or guarding.  Ext: trace to 1+  edema bilaterally  Skin: warm, dry     Assessment and Plan   #Mild cognitive impairment S:Medication:   was started on Aricept  10 mg at bedtime and namenda  10 mg BID by Dr. Georjean and Lauraine Sevin, PA. A/P:  stable- continue current medicines   # ADD S: Diagnosed by geriatric psychiatry-on Strattera  40 mg 1  in the morning to start after a month will be 2 in the morning A/P: hoping improved  or at least stable control with geriatric  psychiatry help- just started so may take some time to know  # Factor V Leiden S: Medication: Chronic Xarelto  20 mg  -Still smoking up to a pack a day-have encouraged full cessation still 1 pack a day  A/P: Factor V Leiden noted and stable on Xarelto -continue current medication - Encouraged smoking cessation- not interested  #hypertension S: medication: None-has been able to come off of medication Home readings #s:  no checks A/P: Blood pressure well-controlled today-thankfully doing well without medicine   #hyperlipidemia with coronary artery calcifications on prior imaging S: Medication: rosuvastatin  40 mg daily  Lab Results  Component  Value Date   CHOL 141 01/23/2024   HDL 47.30 01/23/2024   LDLCALC 75 01/23/2024   LDLDIRECT 86.0 09/13/2019   TRIG 93.0 01/23/2024   CHOLHDL 3 01/23/2024  A/P: Close to ideal control last visit-continue current medication  # Depression S: Medication:Wellbutrin  150 mg extended release, Pristiq  50 mg--> as well as levomilnacipran ER 40 mg 1 in am for 1 month before 2 in am per geriatric psychiatry  A/P: Depression reported in full remission-continue current medication.  PHQ-9 under 5 at last visit and today and hoping for further improvement with tweak   # Hyperglycemia/insulin resistance/prediabetes-peak A1c 6.0 S:  Medication: none Exercise and diet- active with work- tries to do yardowrk as well Lab Results  Component Value Date   HGBA1C 5.7 01/23/2024   HGBA1C 6.0 01/02/2023   HGBA1C 5.9 06/14/2022   A/P: A1c improved last visit-update a1c next visit since labs have looked so good  Recommended follow up: Return in about 6 months (around 02/10/2025) for followup or sooner if needed.Schedule b4 you leave. Future Appointments  Date Time Provider Department Center  09/03/2024  3:00 PM Wertman, Sara E, PA-C [facility] None  11/03/2024  3:00 PM Tasia Lung, MD [facility] None  12/16/2024  8:00 AM [facility] ANNUAL WELLNESS VISIT 1 [facility] [place]   Lab/Order associations:   ICD-10-CM   1. Essential hypertension, benign  I10     2. Hyperlipidemia, unspecified hyperlipidemia type  E78.5     3. Hyperglycemia  R73.9 CANCELED: POCT HgB A1C    4. Mild cognitive impairment (MCI) due to Alzheimer's disease  G31.84    G30.9     5. Screening for diabetes mellitus  Z13.1     6. Current smoker  F17.200      No orders of the defined types were placed in this encounter.  Return precautions advised.  Garnette Lukes, MD

## 2024-08-15 ENCOUNTER — Other Ambulatory Visit: Payer: Self-pay | Admitting: Family Medicine

## 2024-08-17 ENCOUNTER — Ambulatory Visit: Admitting: Physician Assistant

## 2024-08-25 DIAGNOSIS — N400 Enlarged prostate without lower urinary tract symptoms: Secondary | ICD-10-CM | POA: Diagnosis not present

## 2024-08-25 DIAGNOSIS — G309 Alzheimer's disease, unspecified: Secondary | ICD-10-CM | POA: Diagnosis not present

## 2024-08-25 DIAGNOSIS — M1990 Unspecified osteoarthritis, unspecified site: Secondary | ICD-10-CM | POA: Diagnosis not present

## 2024-08-25 DIAGNOSIS — Z7901 Long term (current) use of anticoagulants: Secondary | ICD-10-CM | POA: Diagnosis not present

## 2024-08-25 DIAGNOSIS — E785 Hyperlipidemia, unspecified: Secondary | ICD-10-CM | POA: Diagnosis not present

## 2024-08-25 DIAGNOSIS — F324 Major depressive disorder, single episode, in partial remission: Secondary | ICD-10-CM | POA: Diagnosis not present

## 2024-08-25 DIAGNOSIS — E039 Hypothyroidism, unspecified: Secondary | ICD-10-CM | POA: Diagnosis not present

## 2024-08-25 DIAGNOSIS — I10 Essential (primary) hypertension: Secondary | ICD-10-CM | POA: Diagnosis not present

## 2024-08-25 DIAGNOSIS — F0283 Dementia in other diseases classified elsewhere, unspecified severity, with mood disturbance: Secondary | ICD-10-CM | POA: Diagnosis not present

## 2024-08-25 DIAGNOSIS — D6851 Activated protein C resistance: Secondary | ICD-10-CM | POA: Diagnosis not present

## 2024-08-25 DIAGNOSIS — I700 Atherosclerosis of aorta: Secondary | ICD-10-CM | POA: Diagnosis not present

## 2024-09-02 NOTE — Progress Notes (Signed)
 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 RH male with a history ofhypertension with recent hypotensive event, hyperlipidemia, Factor V Leiden deficiency, DVT on Xarelto , major depression, dysthymic disorder and initial diagnosis of mild cognitive impairment due to Alzheimer's disease with progression to mild dementia seen today in follow up for memory loss. Patient is currently on memantine  10 mg twice daily and donepezil  10 mg daily, tolerating well patient is able to participate on ADLs***and to drive without significant difficulties.  Mood is controlled with psychiatry    Follow up in   months. Continue donepezil  10 mg daily and memantine  10 mg twice daily Recommend good control of her cardiovascular risk factors Recommend hearing evaluation to improve comprehension  Continue to control mood as per PCP    Assessment & Plan Memory impairment   Memory has mildly worsened with increased forgetfulness and difficulty following instructions, though there is no disorientation or wandering. He is more argumentative but shows no significant personality changes, hallucinations, or seizures. Sleep is adequate with occasional vivid dreams, and there are no significant changes in appetite or hydration. Hearing loss may contribute to memory issues. Continue donepezil  10 mg at bedtime and memantine  10 mg twice daily. Recommend a hearing test to assess hearing aid efficacy and encourage his use to improve comprehension and social interaction. Maintain cardiovascular risk factor management, including blood pressure, cholesterol, and blood sugar control. Continue rivaroxaban  for anticoagulation. Ensure mood is well controlled with Dr. Santos.  Hearing loss   Hearing loss may contribute to memory impairment and social interaction issues. He is not currently using hearing aids, which could improve comprehension and memory. Recommend a hearing test to assess hearing  aid efficacy and encourage his use to improve comprehension and social interaction.  Major depressive disorder, in remission   Depression remains unchanged, though medications may contribute to staying awake longer. He is less verbal and engaging in conversations but shows no withdrawal symptoms. Ensure mood is well controlled with Dr. Santos.  Osteoarthritis   Chronic arthritis presents with mild joint pain but no significant worsening. He continues to walk and work, indicating good mobility. Continue current management and monitor for any changes in pain or mobility.     This patient is accompanied in the office by his wife*** who supplements the history.  Previous records as well as any outside records available were reviewed prior to todays visit. Patient was last seen on 12/15/2023 with [facility] 28/30***   Any changes in memory since last visit?   It is worse -wife says, especially with appointments, people that he wants new, conversations his thoughts times are random, not connecting to the context of the conversation .  He continues to work, but has less responsibilities, lighter work.  He makes a list to remember.  He likes watching TV especially football repeats oneself?  Endorsed Disoriented when walking into a room? Denies ***  Leaving objects?  May misplace things, more disorganized than before***  Wandering behavior?  denies   Any personality changes since last visit?  As before, more argumentative. Any worsening depression?:  He has a history of major depression, slightly more withdrawn, psychiatry manages the medications Hallucinations or paranoia?  Denies.   Seizures? denies    Any sleep changes?  Sleeps well, endorses vivid dreams, REM behavior or sleepwalking   Sleep apnea?   Denies.   Any hygiene concerns?  He uses the same clothes repetitively but he does  shower daily Independent of bathing and dressing?  Endorsed  Does the patient needs help with medications?  Patient is  in charge *** Who is in charge of the finances?  Wife is in charge   *** Any changes in appetite?  denies ***   Patient have trouble swallowing? Denies.   Does the patient cook?  He makes his breakfast and lunch*** Any headaches?   denies   Any vision changes?*** Chronic pain?  He has chronic arthritic pain. Ambulates with difficulty? Denies.  *** Recent falls or head injuries? Denies.     Unilateral weakness, numbness or tingling? denies   Any tremors?  Denies  *** Any anosmia?  Yes, worse over the last 6 years, also decreased sense of taste Any incontinence of urine?  Endorsed***  Any bowel dysfunction?   Denies      Patient lives with his wife   *** Does the patient drive?  Yes, locally, uses the GPS*** Discussed the use of AI scribe software for clinical note transcription with the patient, who gave verbal consent to proceed.  History of Present Illness Rodney Scott is a 73 year old male who presents with memory impairment for follow-up of cognitive symptoms.  His memory has not improved over the past six to seven months and may have worsened. He experiences difficulty remembering tasks such as going to the basement to retrieve items and following instructions. He often forgets appointments, new names, and conversations, and his thoughts sometimes do not connect with the context of conversations. He occasionally repeats questions or forgets tasks at work, which has slightly worsened. No disorientation at home or a tendency to wander.  He continues to misplace items and remains disorganized despite being on Strattera . He writes lists to help remember tasks. He still enjoys watching football occasionally. There are no significant personality changes, but he can be more argumentative, as noted by an incident where he arrived early for an appointment and became upset when corrected about the time.  He has a history of major depression, but there are no changes in his depressive  symptoms. He stays awake longer than before, although he is less verbal and engaging in social settings. No hallucinations or seizures. He sleeps approximately eight hours per night, waking only to use the bathroom, and occasionally has vivid dreams but no nightmares.  There are no changes in his hygiene, as he showers daily, but he does not keep his clothes as clean. He is in charge of his medications, with cards written out for guidance, and he believes he is managing well. He has not missed any doses recently. His caregiver has taken over financial responsibilities due to previous difficulties.  His appetite is stable, and he drinks lemonade and some soda daily. He prepares his own breakfast and lunch without kitchen accidents. He has chronic arthritis with mild joint pain that has not worsened. He remains active, walking frequently due to his job. No tremors, stroke symptoms, incontinence, or bowel issues. He continues to drive locally using [employer] for navigation.  There have been no new health issues, hospitalizations, falls, or head injuries. He does not currently use his hearing aids.      Neuropsychological Evaluation , Dr. Richie 09/2023 . Briefly, results suggested severe impairment surrounding both delayed retrieval and recognition/consolidation aspects of memory. Additional impairments were exhibited across confrontation naming and writing samples, while performance variability was exhibited across executive functioning and encoding (i.e., learning) aspects of memory. Relative to previous evaluations  in April 2022 and August 2023, a steady decline was observed year over year surrounding confrontation naming and semantic fluency. While more subtle, year over year decline was also exhibited across memory, largely surrounding retention rates and differentiability. Outside of these domains, Rodney Scott has exhibited relative stability over time.  Regarding the cause of ongoing cognitive  impairment and progressive decline, I continue to have primary concerns surrounding underlying Alzheimer's disease. Despite demonstrating the ability to initially learn some novel verbal information, Rodney Scott was fully amnestic (i.e., 0% retention) across all verbal tasks after a brief delay. He also performed very poorly across yes/no recognition trials. Taken together, this suggests rapid forgetting and a prominent storage impairment, both of which are the hallmark testing characteristics of this illness. Year over year decline surrounding memory retention/discriminability, confrontation naming, and semantic fluency would represent a relatively classic Alzheimer's disease profile. Current testing does align more strongly with this condition relative to evaluations in prior years. As such, this illness represents the most likely culprit for ongoing dysfunction at the present time   History on Initial Assessment 09/25/2018: This is a 73 year old right-handed man with a history of hypertension, hyperlipidemia, Factor V Leiden deficiency, DVT on Xarelto , presenting for evaluation of worsening memory. He and his wife started noticing changes over the past year. He states long-term memory is much better than remembering what he did yesterday. He misplaces things frequently at home, they have lived in the same house for 30 years and his wife has to search for things that have always been in the same place for years. He denies getting lost driving but uses his GPS a lot, he cannot remember the way to places like before. He procrastinates on bills but generally does not miss bills, his wife nods that he has. He occasionally forgets his medications. He continues to work in [employer] and denies any difficulties performing his job. His wife's major concern is how this has affected caring for their 37 year old granddaughter who has been living with them for the past 5 years. He forgets to feed her, which is new. He  goes to a store and forgets what he is doing, she has to give him a pretty detailed note so he can remember events for the day. She has noticed his language skills have decreased markedly, he is not a very verbal person, but she has noticed more difficulty expressing his thoughts or finding words. He may have a thought but stumbles over the words. He repeats himself, one time last summer he told the same story 3-4 times within half an hour. He has always been easily frustrated, but she feels it is worse, such as when driving, no one knows what they are doing. No paranoia or hallucinations.    He denies any headaches, dizziness, vision changes, neck/back pain, focal numbness/tingling/weakness, bowel/bladder dysfunction, or tremors. He has lost his sense of smell over the past year for no clear reason. He denies any head injuries. His mother had dementia in her 76s. 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 When asked about mood, he states I don't know. His wife does not think he is depressed, she is unsure why he is still taking the Wellbutrin  and Pristiq , she feels Wellbutrin  was started 20 years ago for smoking cessation.  MRI of the brain from November 2023, personally reviewed is remarkable for mild chronic small vessel ischemic changes slightly progressing from the prior MRI in 2020.  There is mild generalized cerebral atrophy but stable.      CURRENT MEDICATIONS:  Outpatient Encounter Medications as of 09/03/2024  Medication Sig   atomoxetine  (STRATTERA ) 40 MG capsule 1 qam  for 1 month then 2 qam   buPROPion  (WELLBUTRIN  XL) 150 MG 24 hr tablet Take 1 tablet by mouth once daily   donepezil  (ARICEPT ) 10 MG tablet Take 1 tablet (10 mg total) by mouth at bedtime.   EPINEPHrine  (EPIPEN  2-PAK) 0.3 mg/0.3 mL IJ SOAJ injection Inject 0.3  mg into the muscle as needed for anaphylaxis.   Levomilnacipran  HCl ER (FETZIMA ) 40 MG CP24 1  qam  for 1 month then 2 qam   memantine  (NAMENDA ) 10 MG tablet Take 1 tablet by mouth twice daily   Multiple Vitamin (MULTIVITAMIN) capsule Take by mouth.   rosuvastatin  (CRESTOR ) 40 MG tablet Take 1 tablet by mouth once daily   triamcinolone  cream (KENALOG ) 0.1 % Apply 1 Application topically 2 (two) times daily. (Patient taking differently: Apply 1 Application topically as needed.)   XARELTO  20 MG TABS tablet Take 1 tablet by mouth once daily   No facility-administered encounter medications on file as of 09/03/2024.       09/03/2024    3:00 PM 12/15/2023    4:00 PM 06/13/2023   12:00 PM  MMSE - Mini Mental State Exam  Orientation to time 4 5 5   Orientation to Place 4 5 5   Registration 3 3 3   Attention/ Calculation 5 5 5   Recall 0 1 0  Language- name 2 objects 2 2 2   Language- repeat 1 1 1   Language- follow 3 step command 3 3 3   Language- read & follow direction 1 1 1   Write a sentence 1 1 1   Copy design 0 1 1  Total score 24 28 27       04/23/2019    9:00 AM 12/14/2018    2:00 PM 09/25/2018   10:00 AM  Montreal Cognitive Assessment   Visuospatial/ Executive (0/5) 5 4 5   Naming (0/3) 3 3 3   Attention: Read list of digits (0/2) 2 2 2   Attention: Read list of letters (0/1) 0 1 1  Attention: Serial 7 subtraction starting at 100 (0/3) 3 3 3   Language: Repeat phrase (0/2) 2 2 2   Language : Fluency (0/1) 1 1 1   Abstraction (0/2) 2 2 2   Delayed Recall (0/5) 0 0 0  Orientation (0/6) 6 6 6   Total 24 24 25   Adjusted Score (based on education) 25 25     Objective:    Neurological Exam:    VITALS:   Vitals:   09/03/24 1444  BP: 132/78  Pulse: 88  SpO2: 97%  Weight: 224 lb 9.6 oz (101.9 kg)  Height: 6' 4 (1.93 m)    GEN:  The patient appears stated age and is in NAD. HEENT:  Normocephalic, atraumatic.   Neurological examination:  General: NAD, well-groomed, appears stated  age. Orientation: The patient is alert. Oriented to person, place and not to date*** Cranial nerves: There is good facial symmetry.The speech is fluent and clear, tangential. No aphasia or dysarthria. Fund of knowledge is appropriate. Recent and remote memory are impaired. Attention and concentration are reduced. Able to name objects and repeat phrases.  Hearing is intact to conversational tone. *** Sensation: Sensation is intact to  light touch throughout Motor: Strength is at least antigravity x4. DTR's 2/4 in UE/LE     Movement examination: Tone: There is normal tone in the UE/LE Abnormal movements:  no tremor.  No myoclonus.  No asterixis.   Coordination:  There is no decremation with RAM's. Normal finger to nose  Gait and Station: The patient has no*** difficulty arising out of a deep-seated chair without the use of the hands. The patient's stride length is good.  Gait is cautious and narrow.    Thank you for allowing us  the opportunity to participate in the care of this nice patient. Please do not hesitate to contact us  for any questions or concerns.   Total time spent on today's visit was *** minutes dedicated to this patient today, preparing to see patient, examining the patient, ordering tests and/or medications and counseling the patient, documenting clinical information in the EHR or other health record, independently interpreting results and communicating results to the patient/family, discussing treatment and goals, answering patient's questions and coordinating care.  Cc:  Katrinka Garnette KIDD, MD  Camie Sevin 09/03/2024 3:03 PM

## 2024-09-03 ENCOUNTER — Ambulatory Visit (INDEPENDENT_AMBULATORY_CARE_PROVIDER_SITE_OTHER): Admitting: Physician Assistant

## 2024-09-03 VITALS — BP 132/78 | HR 88 | Ht 76.0 in | Wt 224.6 lb

## 2024-09-03 DIAGNOSIS — G309 Alzheimer's disease, unspecified: Secondary | ICD-10-CM

## 2024-09-03 DIAGNOSIS — F0280 Dementia in other diseases classified elsewhere without behavioral disturbance: Secondary | ICD-10-CM

## 2024-09-03 MED ORDER — DONEPEZIL HCL 10 MG PO TABS: 10.0000 mg | ORAL_TABLET | Freq: Every day | ORAL | 3 refills | Status: AC

## 2024-09-03 MED ORDER — MEMANTINE HCL 10 MG PO TABS: 10.0000 mg | ORAL_TABLET | Freq: Two times a day (BID) | ORAL | 3 refills | Status: DC

## 2024-09-03 NOTE — Patient Instructions (Signed)
 Good to see you.  Continue Donepezil 10mg  daily.  Continue  memantine to  10 mg twice a day  Follow-up in 6 months     FALL PRECAUTIONS: Be cautious when walking. Scan the area for obstacles that may increase the risk of trips and falls. When getting up in the mornings, sit up at the edge of the bed for a few minutes before getting out of bed. Consider elevating the bed at the head end to avoid drop of blood pressure when getting up. Walk always in a well-lit room (use night lights in the walls). Avoid area rugs or power cords from appliances in the middle of the walkways. Use a walker or a cane if necessary and consider physical therapy for balance exercise. Get your eyesight checked regularly.  FINANCIAL OVERSIGHT: Supervision, especially oversight when making financial decisions or transactions is also recommended as difficulties arise.  HOME SAFETY: Consider the safety of the kitchen when operating appliances like stoves, microwave oven, and blender. Consider having supervision and share cooking responsibilities until no longer able to participate in those. Accidents with firearms and other hazards in the house should be identified and addressed as well.  DRIVING: Regarding driving, in patients with progressive memory problems, driving will be impaired. We advise to have someone else do the driving if trouble finding directions or if minor accidents are reported. Independent driving assessment is available to determine safety of driving.  ABILITY TO BE LEFT ALONE: If patient is unable to contact 911 operator, consider using LifeLine, or when the need is there, arrange for someone to stay with patients. Smoking is a fire hazard, consider supervision or cessation. Risk of wandering should be assessed by caregiver and if detected at any point, supervision and safe proof recommendations should be instituted.  MEDICATION SUPERVISION: Inability to self-administer medication needs to be constantly  addressed. Implement a mechanism to ensure safe administration of the medications.  RECOMMENDATIONS FOR ALL PATIENTS WITH MEMORY PROBLEMS: 1. Continue to exercise (Recommend 30 minutes of walking everyday, or 3 hours every week) 2. Increase social interactions - continue going to [place] and enjoy social gatherings with friends and family 3. Eat healthy, avoid fried foods and eat more fruits and vegetables 4. Maintain adequate blood pressure, blood sugar, and blood cholesterol level. Reducing the risk of stroke and cardiovascular disease also helps promoting better memory. 5. Avoid stressful situations. Live a simple life and avoid aggravations. Organize your time and prepare for the next day in anticipation. 6. Sleep well, avoid any interruptions of sleep and avoid any distractions in the bedroom that may interfere with adequate sleep quality 7. Avoid sugar, avoid sweets as there is a strong link between excessive sugar intake, diabetes, and cognitive impairment We discussed the Mediterranean diet, which has been shown to help patients reduce the risk of progressive memory disorders and reduces cardiovascular risk. This includes eating fish, eat fruits and green leafy vegetables, nuts like almonds and hazelnuts, walnuts, and also use olive oil. Avoid fast foods and fried foods as much as possible. Avoid sweets and sugar as sugar use has been linked to worsening of memory function.      Mediterranean Diet  Why follow it? Research shows. Those who follow the Mediterranean diet have a reduced risk of heart disease  The diet is associated with a reduced incidence of Parkinson's and Alzheimer's diseases People following the diet may have longer life expectancies and lower rates of chronic diseases  The Dietary Guidelines for Americans recommends the  Mediterranean diet as an eating plan to promote health and prevent disease  What Is the Mediterranean Diet?  Healthy eating plan based on typical foods  and recipes of Mediterranean-style cooking The diet is primarily a plant based diet; these foods should make up a majority of meals   Starches - Plant based foods should make up a majority of meals - They are an important sources of vitamins, minerals, energy, antioxidants, and fiber - Choose whole grains, foods high in fiber and minimally processed items  - Typical grain sources include wheat, oats, barley, corn, brown rice, bulgar, farro, millet, polenta, couscous  - Various types of beans include chickpeas, lentils, fava beans, black beans, white beans   Fruits  Veggies - Large quantities of antioxidant rich fruits & veggies; 6 or more servings  - Vegetables can be eaten raw or lightly drizzled with oil and cooked  - Vegetables common to the traditional Mediterranean Diet include: artichokes, arugula, beets, broccoli, brussel sprouts, cabbage, carrots, celery, collard greens, cucumbers, eggplant, kale, leeks, lemons, lettuce, mushrooms, okra, onions, peas, peppers, potatoes, pumpkin, radishes, rutabaga, shallots, spinach, sweet potatoes, turnips, zucchini - Fruits common to the Mediterranean Diet include: apples, apricots, avocados, cherries, clementines, dates, figs, grapefruits, grapes, melons, nectarines, oranges, peaches, pears, pomegranates, strawberries, tangerines  Fats - Replace butter and margarine with healthy oils, such as olive oil, canola oil, and tahini  - Limit nuts to no more than a handful a day  - Nuts include walnuts, almonds, pecans, pistachios, pine nuts  - Limit or avoid candied, honey roasted or heavily salted nuts - Olives are central to the [employer] - can be eaten whole or used in a variety of dishes   Meats Protein - Limiting red meat: no more than a few times a month - When eating red meat: choose lean cuts and keep the portion to the size of deck of cards - Eggs: approx. 0 to 4 times a week  - Fish and lean poultry: at least 2 a week  - Healthy protein  sources include, chicken, [place], lean beef, lamb - Increase intake of seafood such as tuna, salmon, trout, mackerel, shrimp, scallops - Avoid or limit high fat processed meats such as sausage and bacon  Dairy - Include moderate amounts of low fat dairy products  - Focus on healthy dairy such as fat free yogurt, skim milk, low or reduced fat cheese - Limit dairy products higher in fat such as whole or 2% milk, cheese, ice cream  Alcohol - Moderate amounts of red wine is ok  - No more than 5 oz daily for women (all ages) and men older than age 30  - No more than 10 oz of wine daily for men younger than 86  Other - Limit sweets and other desserts  - Use herbs and spices instead of salt to flavor foods  - Herbs and spices common to the traditional Mediterranean Diet include: basil, bay leaves, chives, cloves, cumin, fennel, garlic, lavender, marjoram, mint, oregano, parsley, pepper, rosemary, sage, savory, sumac, tarragon, thyme   It's not just a diet, it's a lifestyle:  The Mediterranean diet includes lifestyle factors typical of those in the region  Foods, drinks and meals are best eaten with others and savored Daily physical activity is important for overall good health This could be strenuous exercise like running and aerobics This could also be more leisurely activities such as walking, housework, yard-work, or taking the stairs Moderation is the key; a balanced and  healthy diet accommodates most foods and drinks Consider portion sizes and frequency of consumption of certain foods   Meal Ideas & Options:  Breakfast:  Whole wheat toast or whole wheat English muffins with peanut butter & hard boiled egg Steel cut oats topped with apples & cinnamon and skim milk  Fresh fruit: banana, strawberries, melon, berries, peaches  Smoothies: strawberries, bananas, greek yogurt, peanut butter Low fat greek yogurt with blueberries and granola  Egg white omelet with spinach and mushrooms Breakfast  couscous: whole wheat couscous, apricots, skim milk, cranberries  Sandwiches:  Hummus and grilled vegetables (peppers, zucchini, squash) on whole wheat bread   Grilled chicken on whole wheat pita with lettuce, tomatoes, cucumbers or tzatziki  [place] salad on whole wheat bread: tuna salad made with greek yogurt, olives, red peppers, capers, green onions Garlic rosemary lamb pita: lamb sauted with garlic, rosemary, salt & pepper; add lettuce, cucumber, greek yogurt to pita - flavor with lemon juice and black pepper  Seafood:  Mediterranean grilled salmon, seasoned with garlic, basil, parsley, lemon juice and black pepper Shrimp, lemon, and spinach whole-grain pasta salad made with low fat greek yogurt  Seared scallops with lemon orzo  Seared tuna steaks seasoned salt, pepper, coriander topped with tomato mixture of olives, tomatoes, olive oil, minced garlic, parsley, green onions and cappers  Meats:  Herbed greek chicken salad with kalamata olives, cucumber, feta  Red bell peppers stuffed with spinach, bulgur, lean ground beef (or lentils) & topped with feta   Kebabs: skewers of chicken, tomatoes, onions, zucchini, squash  [place] burgers: made with red onions, mint, dill, lemon juice, feta cheese topped with roasted red peppers Vegetarian Cucumber salad: cucumbers, artichoke hearts, celery, red onion, feta cheese, tossed in olive oil & lemon juice  Hummus and whole grain pita points with a greek salad (lettuce, tomato, feta, olives, cucumbers, red onion) Lentil soup with celery, carrots made with vegetable broth, garlic, salt and pepper  Tabouli salad: parsley, bulgur, mint, scallions, cucumbers, tomato, radishes, lemon juice, olive oil, salt and pepper.

## 2024-09-04 DIAGNOSIS — G309 Alzheimer's disease, unspecified: Secondary | ICD-10-CM | POA: Insufficient documentation

## 2024-09-11 ENCOUNTER — Other Ambulatory Visit: Payer: Self-pay | Admitting: Family Medicine

## 2024-09-26 ENCOUNTER — Other Ambulatory Visit: Payer: Self-pay | Admitting: Family Medicine

## 2024-11-03 ENCOUNTER — Ambulatory Visit (HOSPITAL_BASED_OUTPATIENT_CLINIC_OR_DEPARTMENT_OTHER): Admitting: Psychiatry

## 2024-11-03 ENCOUNTER — Other Ambulatory Visit: Payer: Self-pay

## 2024-11-03 VITALS — BP 112/76 | HR 108 | Ht 76.0 in | Wt 226.0 lb

## 2024-11-03 DIAGNOSIS — F341 Dysthymic disorder: Secondary | ICD-10-CM | POA: Diagnosis not present

## 2024-11-03 MED ORDER — BUPROPION HCL ER (XL) 150 MG PO TB24: 150.0000 mg | ORAL_TABLET | Freq: Every day | ORAL | 6 refills | Status: AC

## 2024-11-03 MED ORDER — FETZIMA 40 MG PO CP24: ORAL_CAPSULE | ORAL | 4 refills | Status: AC

## 2024-11-03 MED ORDER — ATOMOXETINE HCL 40 MG PO CAPS: ORAL_CAPSULE | ORAL | 4 refills | Status: AC

## 2024-11-03 NOTE — Progress Notes (Signed)
 " Psychiatric Initial Adult Assessment   Patient Identification: Rodney Scott MRN:  [id number] Date of Evaluation:  11/03/2024 Referral Source: Lauraine Netter and Dr. Elspeth Lukes Chief Complaint: I cannot remember No chief complaint on file.  Visit Diagnosis: Dementia  History of Present Illness:      Today the patient is doing about the same.  However his wife agrees that he is actually more engaging less withdrawn since the higher dose of Fetzima .  Noticed the patient takes 150 mg of Wellbutrin , full dose Strattera  and 80 mg of Fetzima .  On his last visit we will restart the Fetzima  he said he did feel about 25% better.  The patient amazingly continues to work 40 hours.  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  He has no problem in his job is very reliable and consistent.  The patient himself complains that he feels like he is more forgetful but this is not reflected in his work international aid/development worker.  They say is working well.  The patient works 40 hours comes home and for dinner every night watches television.  He watches jeopardy and other shows.  He enjoys it.  Enjoys working.  He also enjoys some time with other activities in the home.  Associated Signs/Symptoms: Depression Symptoms:   (Hypo) Manic Symptoms:   Anxiety Symptoms:   Psychotic Symptoms:   PTSD Symptoms: NA  Past Psychiatric History: Wellbutrin  and Pristiq   Previous Psychotropic Medications: Yes   Substance Abuse History in the last 12 months:  No.  Consequences of Substance Abuse: Negative  Past Medical History:  Past Medical History:  Diagnosis Date   Allergic rhinitis 07/31/2007   Loratadine  at times- but mild mental fog on this    BPH associated with nocturia 03/10/2018   Chronic anticoagulation 10/18/2022   Clotting disorder    Current smoker 07/26/2014   Essential hypertension, benign 07/26/2014   Benazepril  20mg     Factor V Leiden 07/31/2007   Xarelto  20mg  daily. 2 DVT 2001, PE 2011.       History of colonic polyps 07/31/2007   02/2015 adenoma. 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 Patient states thought was external.      Left inguinal hernia 12/05/2016   Declines surgery referral- minimally bothering him. Can call in if he changes his mind   Major depressive disorder in full remission 03/24/2009   Wellbutrin  xl 300mg -->150mg --> off 03/2020  pristiq  50mg .   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 Georjean sees him now. On aricept .     Osteoarthritis    Right rotator cuff tear 12/07/2010   S/p surgery.     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 COmpression stocking on left. L >R swelling-history of 1st DVT in L.       Past Surgical History:  Procedure Laterality Date   COLONOSCOPY     right rotator cuff repair     SALIVARY STONE REMOVAL      Family Psychiatric History:   Family History:  Family History  Problem Relation Age of Onset  Memory loss Mother    Colon polyps Father    Colon cancer Father 15   Factor V Leiden deficiency Son    Esophageal cancer Neg Hx    Rectal cancer Neg Hx    Stomach cancer Neg Hx     Social History:   Social History   Socioeconomic History   Marital status: Married    Spouse name: Not on file   Number of children: 2   Years of education: 12   Highest education level: 12th grade  Occupational History   Occupation: Maintenance  Tobacco Use   Smoking status: Every Day    Current packs/day: 1.00    Average packs/day: 1 pack/day for 57.0 years (57.0 ttl pk-yrs)    Types: Cigarettes    Smokeless tobacco: Never   Tobacco comments:    1 ppd  Vaping Use   Vaping status: Never Used  Substance and Sexual Activity   Alcohol use: Yes    Comment: rare alcohol consumption   Drug use: No   Sexual activity: Yes  Other Topics Concern   Not on file  Social History Narrative   Married (wife patient of Dr. Katrinka). Son and daughter. 1 granddaughter.       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      Hobbies: time around house, time with family, yardwork, some cabinetry      Lives in 2 story home with wife and granddaughter, Brewing Technologist school graduate      Right handed    Social Drivers of Health   Tobacco Use: High Risk (08/13/2024)   Patient History    Smoking Tobacco Use: Every Day    Smokeless Tobacco Use: Never    Passive Exposure: Not on file  Financial Resource Strain: Low Risk (08/12/2024)   Overall Financial Resource Strain (CARDIA)    Difficulty of Paying Living Expenses: Not hard at all  Food Insecurity: No Food Insecurity (08/12/2024)   Epic    Worried About Programme Researcher, Broadcasting/film/video in the Last Year: Never true    Ran Out of Food in the Last Year: Never true  Transportation Needs: No Transportation Needs (08/12/2024)   Epic    Lack of Transportation (Medical): No    Lack of Transportation (Non-Medical): No  Physical Activity: Inactive (08/12/2024)   Exercise Vital Sign    Days of Exercise per Week: 0 days    Minutes of Exercise per Session: Not on file  Stress: No Stress Concern Present (08/12/2024)   [employer] of Occupational Health - Occupational Stress Questionnaire    Feeling of Stress: Not at all  Social Connections: Moderately Isolated (08/12/2024)   Social Connection and Isolation Panel    Frequency of Communication with Friends and Family: Twice a week    Frequency of Social Gatherings with Friends and Family: Once a week    Attends Religious  Services: Never    Database Administrator or Organizations: No    Attends Banker Meetings: Not on file    Marital Status: Married  Depression (PHQ2-9): Low Risk (08/13/2024)   Depression (PHQ2-9)    PHQ-2 Score: 3  Alcohol Screen: Low Risk (12/11/2023)   Alcohol Screen    Last Alcohol Screening Score (AUDIT): 2  Housing: Low Risk (08/12/2024)   Epic    Unable to Pay for Housing in the Last Year: No    Number of Times  Moved in the Last Year: 0    Homeless in the Last Year: No  Utilities: Not At Risk (12/11/2023)   AHC Utilities    Threatened with loss of utilities: No  Health Literacy: Adequate Health Literacy (12/11/2023)   B1300 Health Literacy    Frequency of need for help with medical instructions: Never    Additional Social History:  Allergies:   Allergies  Allergen Reactions   Yellow Jacket Venom [Bee Venom] Shortness Of Breath    Had swelling localized, chest tightness, and shortness of breath    Metabolic Disorder Labs: Lab Results  Component Value Date   HGBA1C 5.7 01/23/2024   MPG 114 09/22/2020   No results found for: PROLACTIN Lab Results  Component Value Date   CHOL 141 01/23/2024   TRIG 93.0 01/23/2024   HDL 47.30 01/23/2024   CHOLHDL 3 01/23/2024   VLDL 18.6 01/23/2024   LDLCALC 75 01/23/2024   LDLCALC 84 06/14/2022   Lab Results  Component Value Date   TSH 1.314 09/10/2022    Therapeutic Level Labs: No results found for: LITHIUM No results found for: CBMZ No results found for: VALPROATE  Current Medications: Current Outpatient Medications  Medication Sig Dispense Refill   donepezil  (ARICEPT ) 10 MG tablet Take 1 tablet (10 mg total) by mouth at bedtime. 90 tablet 3   EPINEPHrine  (EPIPEN  2-PAK) 0.3 mg/0.3 mL IJ SOAJ injection Inject 0.3 mg into the muscle as needed for anaphylaxis. 1 each 0   memantine  (NAMENDA ) 10 MG tablet Take 1 tablet (10 mg total) by mouth 2 (two) times daily. 180 tablet 3   Multiple Vitamin  (MULTIVITAMIN) capsule Take by mouth.     rosuvastatin  (CRESTOR ) 40 MG tablet Take 1 tablet by mouth once daily 90 tablet 0   triamcinolone  cream (KENALOG ) 0.1 % Apply 1 Application topically 2 (two) times daily. (Patient taking differently: Apply 1 Application topically as needed.) 30 g 0   XARELTO  20 MG TABS tablet Take 1 tablet by mouth once daily 90 tablet 0   atomoxetine  (STRATTERA ) 40 MG capsule 1 qam  for 1 month then 2 qam 60 capsule 4   buPROPion  (WELLBUTRIN  XL) 150 MG 24 hr tablet Take 1 tablet (150 mg total) by mouth daily. 30 tablet 6   Levomilnacipran  HCl ER (FETZIMA ) 40 MG CP24 3  qam 90 capsule 4   No current facility-administered medications for this visit.    Musculoskeletal: Strength & Muscle Tone: within normal limits Gait & Station: normal Patient leans: N/A  Psychiatric Specialty Exam: Review of Systems  Blood pressure 112/76, pulse (!) 108, height 6' 4 (1.93 m), weight 226 lb (102.5 kg).Body mass index is 27.51 kg/m.  General Appearance: NA  Eye Contact:  Good  Speech:  Clear and Coherent  Volume:  Normal  Mood:  NA  Affect:  Appropriate  Thought Process:  Coherent  Orientation:  Full (Time, Place, and Person)  Thought Content:  WDL  Suicidal Thoughts:  No  Homicidal Thoughts:  No  Memory:  Recent;   Fair  Judgement:  Good  Insight:  Fair  Psychomotor Activity:  Normal  Concentration:    Recall:  Poor  Fund of Knowledge:Fair  Language: Good  Akathisia:  No  Handed:  Right  AIMS (if indicated):  not done  Assets:  Desire for Improvement  ADL's:  Intact  Cognition: WNL  Sleep:  Good   Screenings: GAD-7    Flowsheet Row Office Visit from 08/13/2024 in [facility]  at [facility] [employer] [facility] from 08/22/2023 in [facility] [employer] Visit from 05/13/2023 in [facility] at [facility] [employer] from 01/02/2023 in [facility] [place]  Total  GAD-7 Score 0 0 0 0   Mini-Mental    Flowsheet Row Office Visit from 09/03/2024 in [facility] Office Visit from 12/15/2023 in [facility] Neurology Office Visit from 06/13/2023 in [facility] Neurology Office Visit from 12/06/2022 in [facility] Neurology Office Visit from 06/07/2022 in [facility] Neurology  Total Score (max 30 points ) 24 28 27 25 29    PHQ2-9    Flowsheet Row Office Visit from 08/13/2024 in [facility] [address] [place] Office Visit from 01/23/2024 in [facility] [address] [place] Clinical Support from 12/11/2023 in [facility] [employer] Visit from 08/22/2023 in [facility] [employer] Visit from 05/13/2023 in [facility] [address] [place]  PHQ-2 Total Score 2 1 0 1 2  PHQ-9 Total Score 3 1 -- 2 4   Flowsheet Row ED from 05/03/2023 in [facility] Emergency Department at [facility] ED from 09/11/2022 in [facility] Emergency Department at [facility] ED from 09/10/2022 in [facility] Emergency Department at [facility]  C-SSRS RISK CATEGORY No Risk No Risk No Risk    Assessment and Plan:    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  He will continue taking the 150 mg of Wellbutrin  continue his Strattera  and we will increase his Fetzima  to the maximum dose of 120 mg.  He will take a 40 mg pill and take 3 of them together.  The patient certainly is not suicidal.  In review of his symptoms he is sleeping and eating fine he has reasonably good energy.  He really does not have a problem with thinking or concentrating even though he complains of memory problems.  He denies worthlessness and I think his dysphoria would be considered to be mild.  The issue is that he does not seem to be activated or being enjoying all that much.  But the patient does not appear all that  distressed from it.  Because he shows some improvement with increasing doses of Fetzima  we will increase it to the maximum dose of 120 mg.  He will return in 7 weeks and if he has not improved we will consider tapering him off and down from his Fetzima  for months.  The patient is functioning well.  It should be noted the patient is being seen by neurologist who is giving him Aricept  and Namenda .  Collaboration of Care:   Patient/Guardian was advised Release of Information must be obtained prior to any record release in order to collaborate their care with an outside provider. Patient/Guardian was advised if they have not already done so to contact the registration department to sign all necessary forms in order for us  to release information regarding their care.   Consent: Patient/Guardian gives verbal consent for treatment and assignment of benefits for services provided during this visit. Patient/Guardian expressed understanding and agreed to proceed.   Elna LILLETTE Lo, MD 1/21/20263:47 PM  "

## 2024-11-13 ENCOUNTER — Other Ambulatory Visit: Payer: Self-pay | Admitting: Physician Assistant

## 2024-11-15 ENCOUNTER — Encounter (HOSPITAL_COMMUNITY): Payer: Self-pay

## 2024-12-16 ENCOUNTER — Ambulatory Visit: Payer: Medicare HMO

## 2024-12-22 ENCOUNTER — Ambulatory Visit (HOSPITAL_COMMUNITY): Admitting: Psychiatry

## 2025-03-04 ENCOUNTER — Ambulatory Visit: Admitting: Physician Assistant
